# Patient Record
Sex: Female | Born: 1950 | Race: White | Hispanic: No | Marital: Single | Smoking: Never smoker
Health system: Southern US, Community
[De-identification: ages and names within clinical notes are randomized; demographics above are authoritative.]

## PROBLEM LIST (undated history)

## (undated) DIAGNOSIS — C801 Malignant (primary) neoplasm, unspecified: Secondary | ICD-10-CM

## (undated) DIAGNOSIS — I219 Acute myocardial infarction, unspecified: Secondary | ICD-10-CM

## (undated) DIAGNOSIS — E559 Vitamin D deficiency, unspecified: Secondary | ICD-10-CM

## (undated) DIAGNOSIS — D649 Anemia, unspecified: Secondary | ICD-10-CM

## (undated) DIAGNOSIS — I509 Heart failure, unspecified: Secondary | ICD-10-CM

## (undated) DIAGNOSIS — E669 Obesity, unspecified: Secondary | ICD-10-CM

## (undated) DIAGNOSIS — E119 Type 2 diabetes mellitus without complications: Secondary | ICD-10-CM

## (undated) DIAGNOSIS — I2510 Atherosclerotic heart disease of native coronary artery without angina pectoris: Secondary | ICD-10-CM

## (undated) DIAGNOSIS — Z973 Presence of spectacles and contact lenses: Secondary | ICD-10-CM

## (undated) DIAGNOSIS — S42402A Unspecified fracture of lower end of left humerus, initial encounter for closed fracture: Secondary | ICD-10-CM

## (undated) HISTORY — DX: Acute myocardial infarction, unspecified: I21.9

## (undated) HISTORY — DX: Atherosclerotic heart disease of native coronary artery without angina pectoris: I25.10

## (undated) HISTORY — PX: SACRAL NERVE STIMULATOR PLACEMENT: SHX2367

## (undated) HISTORY — PX: CARDIAC CATHETERIZATION: SHX172

## (undated) HISTORY — DX: Obesity, unspecified: E66.9

---

## 1997-01-04 HISTORY — PX: CHOLECYSTECTOMY: SHX55

## 1997-05-09 ENCOUNTER — Ambulatory Visit (HOSPITAL_COMMUNITY): Admission: RE | Admit: 1997-05-09 | Discharge: 1997-05-09 | Payer: Self-pay | Admitting: Cardiovascular Disease

## 1998-03-10 ENCOUNTER — Ambulatory Visit (HOSPITAL_COMMUNITY): Admission: RE | Admit: 1998-03-10 | Discharge: 1998-03-10 | Payer: Self-pay | Admitting: *Deleted

## 1998-03-10 ENCOUNTER — Encounter: Payer: Self-pay | Admitting: *Deleted

## 1998-12-12 ENCOUNTER — Encounter: Admission: RE | Admit: 1998-12-12 | Discharge: 1999-03-12 | Payer: Self-pay | Admitting: Anesthesiology

## 1999-03-13 ENCOUNTER — Encounter: Admission: RE | Admit: 1999-03-13 | Discharge: 1999-06-11 | Payer: Self-pay | Admitting: Anesthesiology

## 1999-04-03 ENCOUNTER — Other Ambulatory Visit: Admission: RE | Admit: 1999-04-03 | Discharge: 1999-04-03 | Payer: Self-pay | Admitting: Family Medicine

## 2000-05-18 ENCOUNTER — Encounter: Admission: RE | Admit: 2000-05-18 | Discharge: 2000-08-03 | Payer: Self-pay | Admitting: Anesthesiology

## 2000-06-03 ENCOUNTER — Encounter: Admission: RE | Admit: 2000-06-03 | Discharge: 2000-08-03 | Payer: Self-pay | Admitting: Surgical Oncology

## 2001-01-04 DIAGNOSIS — E669 Obesity, unspecified: Secondary | ICD-10-CM

## 2001-01-04 HISTORY — DX: Obesity, unspecified: E66.9

## 2001-01-04 HISTORY — PX: GASTRIC BYPASS: SHX52

## 2001-04-24 ENCOUNTER — Other Ambulatory Visit: Admission: RE | Admit: 2001-04-24 | Discharge: 2001-04-24 | Payer: Self-pay | Admitting: *Deleted

## 2001-05-01 ENCOUNTER — Encounter: Admission: RE | Admit: 2001-05-01 | Discharge: 2001-05-01 | Payer: Self-pay | Admitting: Family Medicine

## 2001-05-01 ENCOUNTER — Encounter: Payer: Self-pay | Admitting: Family Medicine

## 2001-05-04 ENCOUNTER — Encounter: Admission: RE | Admit: 2001-05-04 | Discharge: 2001-05-04 | Payer: Self-pay | Admitting: Family Medicine

## 2001-05-04 ENCOUNTER — Encounter: Payer: Self-pay | Admitting: Family Medicine

## 2002-08-13 ENCOUNTER — Encounter: Payer: Self-pay | Admitting: Family Medicine

## 2002-08-13 ENCOUNTER — Encounter: Admission: RE | Admit: 2002-08-13 | Discharge: 2002-08-13 | Payer: Self-pay | Admitting: Family Medicine

## 2002-11-05 ENCOUNTER — Emergency Department (HOSPITAL_COMMUNITY): Admission: EM | Admit: 2002-11-05 | Discharge: 2002-11-05 | Payer: Self-pay | Admitting: Emergency Medicine

## 2002-11-19 ENCOUNTER — Ambulatory Visit (HOSPITAL_COMMUNITY): Admission: RE | Admit: 2002-11-19 | Discharge: 2002-11-19 | Payer: Self-pay | Admitting: Family Medicine

## 2003-06-13 ENCOUNTER — Other Ambulatory Visit: Admission: RE | Admit: 2003-06-13 | Discharge: 2003-06-13 | Payer: Self-pay | Admitting: Family Medicine

## 2003-07-02 ENCOUNTER — Ambulatory Visit (HOSPITAL_COMMUNITY): Admission: RE | Admit: 2003-07-02 | Discharge: 2003-07-02 | Payer: Self-pay | Admitting: Family Medicine

## 2003-07-05 DIAGNOSIS — I2510 Atherosclerotic heart disease of native coronary artery without angina pectoris: Secondary | ICD-10-CM

## 2003-07-05 HISTORY — DX: Atherosclerotic heart disease of native coronary artery without angina pectoris: I25.10

## 2003-07-24 ENCOUNTER — Inpatient Hospital Stay (HOSPITAL_BASED_OUTPATIENT_CLINIC_OR_DEPARTMENT_OTHER): Admission: RE | Admit: 2003-07-24 | Discharge: 2003-07-24 | Payer: Self-pay | Admitting: *Deleted

## 2003-08-08 ENCOUNTER — Ambulatory Visit (HOSPITAL_COMMUNITY): Admission: RE | Admit: 2003-08-08 | Discharge: 2003-08-08 | Payer: Self-pay | Admitting: Cardiovascular Disease

## 2005-06-23 ENCOUNTER — Encounter: Admission: RE | Admit: 2005-06-23 | Discharge: 2005-06-23 | Payer: Self-pay | Admitting: Family Medicine

## 2006-09-21 ENCOUNTER — Encounter: Admission: RE | Admit: 2006-09-21 | Discharge: 2006-09-21 | Payer: Self-pay | Admitting: Family Medicine

## 2006-10-05 ENCOUNTER — Encounter: Admission: RE | Admit: 2006-10-05 | Discharge: 2006-10-05 | Payer: Self-pay | Admitting: Family Medicine

## 2008-06-04 ENCOUNTER — Encounter (INDEPENDENT_AMBULATORY_CARE_PROVIDER_SITE_OTHER): Payer: Self-pay | Admitting: *Deleted

## 2008-06-20 ENCOUNTER — Encounter: Admission: RE | Admit: 2008-06-20 | Discharge: 2008-06-20 | Payer: Self-pay | Admitting: Family Medicine

## 2008-09-26 ENCOUNTER — Emergency Department (HOSPITAL_COMMUNITY): Admission: EM | Admit: 2008-09-26 | Discharge: 2008-09-26 | Payer: Self-pay | Admitting: Emergency Medicine

## 2008-11-15 ENCOUNTER — Ambulatory Visit: Payer: Self-pay | Admitting: Family Medicine

## 2008-11-15 DIAGNOSIS — M2140 Flat foot [pes planus] (acquired), unspecified foot: Secondary | ICD-10-CM | POA: Insufficient documentation

## 2008-11-15 DIAGNOSIS — E663 Overweight: Secondary | ICD-10-CM | POA: Insufficient documentation

## 2008-11-15 DIAGNOSIS — Z8679 Personal history of other diseases of the circulatory system: Secondary | ICD-10-CM | POA: Insufficient documentation

## 2008-11-15 DIAGNOSIS — I2510 Atherosclerotic heart disease of native coronary artery without angina pectoris: Secondary | ICD-10-CM | POA: Insufficient documentation

## 2008-11-15 DIAGNOSIS — Z862 Personal history of diseases of the blood and blood-forming organs and certain disorders involving the immune mechanism: Secondary | ICD-10-CM | POA: Insufficient documentation

## 2008-11-15 DIAGNOSIS — M79609 Pain in unspecified limb: Secondary | ICD-10-CM | POA: Insufficient documentation

## 2008-11-15 DIAGNOSIS — Z8639 Personal history of other endocrine, nutritional and metabolic disease: Secondary | ICD-10-CM | POA: Insufficient documentation

## 2008-11-15 DIAGNOSIS — E559 Vitamin D deficiency, unspecified: Secondary | ICD-10-CM | POA: Insufficient documentation

## 2008-11-19 ENCOUNTER — Encounter: Payer: Self-pay | Admitting: Family Medicine

## 2008-11-19 ENCOUNTER — Ambulatory Visit: Payer: Self-pay | Admitting: Family Medicine

## 2008-11-19 ENCOUNTER — Other Ambulatory Visit: Admission: RE | Admit: 2008-11-19 | Discharge: 2008-11-19 | Payer: Self-pay | Admitting: Family Medicine

## 2008-11-19 LAB — HM PAP SMEAR

## 2008-11-20 ENCOUNTER — Encounter (INDEPENDENT_AMBULATORY_CARE_PROVIDER_SITE_OTHER): Payer: Self-pay | Admitting: *Deleted

## 2008-11-20 LAB — CONVERTED CEMR LAB
Albumin: 3.6 g/dL (ref 3.5–5.2)
Bilirubin, Direct: 0 mg/dL (ref 0.0–0.3)
Cholesterol: 131 mg/dL (ref 0–200)
Creatinine, Ser: 0.5 mg/dL (ref 0.4–1.2)
GFR calc non Af Amer: 135.39 mL/min (ref >60)
HDL: 43 mg/dL (ref >39.00)
LDL Cholesterol: 69 mg/dL (ref 0–99)
Potassium: 3.7 meq/L (ref 3.5–5.1)
Sodium: 143 meq/L (ref 135–145)
Total Protein: 6.6 g/dL (ref 6.0–8.3)
Triglycerides: 97 mg/dL (ref 0.0–149.0)
VLDL: 19.4 mg/dL (ref 0.0–40.0)

## 2008-12-11 ENCOUNTER — Telehealth: Payer: Self-pay | Admitting: Family Medicine

## 2009-01-21 ENCOUNTER — Ambulatory Visit: Payer: Self-pay | Admitting: Family Medicine

## 2009-01-22 ENCOUNTER — Encounter: Payer: Self-pay | Admitting: Family Medicine

## 2009-01-23 ENCOUNTER — Telehealth: Payer: Self-pay | Admitting: Family Medicine

## 2009-02-21 ENCOUNTER — Telehealth: Payer: Self-pay | Admitting: Internal Medicine

## 2009-03-04 ENCOUNTER — Ambulatory Visit: Payer: Self-pay | Admitting: Family Medicine

## 2009-03-18 ENCOUNTER — Ambulatory Visit: Payer: Self-pay | Admitting: Family Medicine

## 2009-03-20 ENCOUNTER — Encounter: Payer: Self-pay | Admitting: Family Medicine

## 2009-05-06 ENCOUNTER — Ambulatory Visit: Payer: Self-pay | Admitting: Family Medicine

## 2009-05-06 DIAGNOSIS — R5383 Other fatigue: Secondary | ICD-10-CM

## 2009-05-06 DIAGNOSIS — R5381 Other malaise: Secondary | ICD-10-CM | POA: Insufficient documentation

## 2009-05-06 DIAGNOSIS — M5412 Radiculopathy, cervical region: Secondary | ICD-10-CM | POA: Insufficient documentation

## 2009-05-06 LAB — CONVERTED CEMR LAB
Basophils Absolute: 0 10*3/uL (ref 0.0–0.1)
Basophils Relative: 0.2 % (ref 0.0–3.0)
Folate: 13.5 ng/mL
HCT: 35.4 % — ABNORMAL LOW (ref 36.0–46.0)
Lymphs Abs: 1.5 10*3/uL (ref 0.7–4.0)
MCHC: 34 g/dL (ref 30.0–36.0)
MCV: 91.6 fL (ref 78.0–100.0)
Monocytes Absolute: 0.5 10*3/uL (ref 0.1–1.0)
Monocytes Relative: 7 % (ref 3.0–12.0)
Neutrophils Relative %: 70.1 % (ref 43.0–77.0)
Platelets: 221 10*3/uL (ref 150.0–400.0)
Saturation Ratios: 14 % — ABNORMAL LOW (ref 20.0–50.0)

## 2009-05-07 DIAGNOSIS — M949 Disorder of cartilage, unspecified: Secondary | ICD-10-CM

## 2009-05-07 DIAGNOSIS — M899 Disorder of bone, unspecified: Secondary | ICD-10-CM | POA: Insufficient documentation

## 2009-05-14 ENCOUNTER — Encounter: Admission: RE | Admit: 2009-05-14 | Discharge: 2009-05-14 | Payer: Self-pay | Admitting: Family Medicine

## 2009-06-24 ENCOUNTER — Encounter: Admission: RE | Admit: 2009-06-24 | Discharge: 2009-06-24 | Payer: Self-pay | Admitting: Family Medicine

## 2009-06-24 LAB — HM MAMMOGRAPHY: HM Mammogram: NORMAL

## 2009-08-27 ENCOUNTER — Ambulatory Visit: Payer: Self-pay | Admitting: Family Medicine

## 2010-01-21 ENCOUNTER — Ambulatory Visit
Admission: RE | Admit: 2010-01-21 | Discharge: 2010-01-21 | Payer: Self-pay | Source: Home / Self Care | Attending: Family Medicine | Admitting: Family Medicine

## 2010-01-21 DIAGNOSIS — F4323 Adjustment disorder with mixed anxiety and depressed mood: Secondary | ICD-10-CM | POA: Insufficient documentation

## 2010-02-05 NOTE — Assessment & Plan Note (Signed)
Summary: IRON LOW/DLO   Vital Signs:  Patient profile:   60 year old female Height:      60 inches Weight:      177.50 pounds BMI:     34.79 Temp:     98.7 degrees F oral Pulse rate:   64 / minute Pulse rhythm:   regular BP sitting:   142 / 72  (left arm) Cuff size:   large  Vitals Entered By: Delilah Shan CMA [facility]) (May 06, 2009 10:23 AM) CC: fatigue   History of Present Illness: 60 yo with 2-3 weeks of fatigue.  Not sleeping more or less. No DOE or any SOB. No CP, no LE edema. Has had iron deficiecny is past so she was wondering if that is what is causing this.  She does have h/o VIt D deficiency, last Vit D was 30 on March 18, 2009.  Still taking 2000 units daily.    H/o gastric bypass, so absorption of Vit D and other compounds is an issue.  Also noticed some tingling in her right hand a few days ago. No decreased strength or range of motion.  She can always "shake it out."  noticed in her forearm as well several weeks ago.  Current Medications (verified): 1)  Alendronate Sodium 70 Mg Tabs (Alendronate Sodium) .... Take 1 Tablet By Mouth Once A Week 2)  Vitamin D3 10000 Unit Caps (Cholecalciferol) .... 2 Tabs By Mouth Daily. 3)  Multivitamins   Tabs (Multiple Vitamin) .... Take 1 Tablet By Mouth Once A Day 4)  Vitamin D 1000 Unit  Tabs (Cholecalciferol) .... Take One Tablet By Mouth Once Daily  Allergies: 1)  ! Penicillin  Review of Systems      See HPI General:  Complains of fatigue and malaise; denies sleep disorder. Eyes:  Denies blurring. ENT:  Denies difficulty swallowing. CV:  Denies chest pain or discomfort. Resp:  Denies shortness of breath. GI:  Denies abdominal pain, bloody stools, and change in bowel habits. GU:  Denies abnormal vaginal bleeding. MS:  Denies joint pain, joint redness, and joint swelling. Derm:  Denies rash. Neuro:  Denies headaches. Psych:  Denies anxiety and depression. Endo:  Denies cold intolerance and heat  intolerance.  Physical Exam  General:  Well-developed,well-nourished,in no acute distress; alert,appropriate and cooperative throughout examination, very pleasant. Eyes:  No corneal or conjunctival inflammation noted. EOMI. Perrla. Funduscopic exam benign, without hemorrhages, exudates or papilledema. Vision grossly normal. Mouth:  Oral mucosa and oropharynx without lesions or exudates.  Teeth in good repair. Neck:  No deformities, masses, or tenderness noted. Lungs:  Normal respiratory effort, chest expands symmetrically. Lungs are clear to auscultation, no crackles or wheezes. Heart:  Normal rate and regular rhythm. S1 and S2 normal without gallop, murmur, click, rub or other extra sounds. Abdomen:  Bowel sounds positive,abdomen soft and non-tender without masses, organomegaly or hernias noted. Extremities:  No clubbing, cyanosis, edema, or deformity noted with normal full range of motion of all joints.   Neurologic:  Normal strength in all extremities, normal sensation.   Shoulder/Elbow Exam  Shoulder Exam:    Right:    Inspection:  Normal    Palpation:  Normal    Stability:  stable    Tenderness:  no    Swelling:  no    Erythema:  no    Normal range of motion.    Left:    Inspection:  Normal    Palpation:  Normal    Stability:  stable  Tenderness:  no    Swelling:  no    Erythema:  no   Impression & Recommendations:  Problem # 1:  FATIGUE (ICD-780.79) Assessment New Differential is wide.  Given her h/o decreased absorption s/p gastric bypass, B12/folate deficiency is at the top of my differential.  May also explain some of her radiculopathy. Will check B12, folate, CBC, TSH. Recheck VIt D as well as deficiency can exacerbate fatigue. 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  Most likely related to cervical disc disease.  If blood work normal and symptoms persist, will need imaging. No sign of carpal tunnel.  Complete Medication List: 1)  Alendronate Sodium 70 Mg Tabs (Alendronate sodium) .... Take 1 tablet by mouth once a week 2)  Vitamin D3 10000 Unit Caps (Cholecalciferol) .... 2 tabs by mouth daily. 3)  Multivitamins Tabs (Multiple vitamin) .... Take 1 tablet by mouth once a day 4)  Vitamin D 1000 Unit Tabs (Cholecalciferol) .... Take one tablet by mouth once daily  Other Orders: T-Vitamin D (25-Hydroxy) ([id number])  Current Allergies (reviewed today): ! PENICILLIN

## 2010-02-05 NOTE — Letter (Signed)
Summary: Certification of Disability for Property Tax Exclusion/State of   Certification of Disability for Property Tax Exclusion/State of [place]   Imported By: Lanelle Bal 01/28/2009 14:11:26  _____________________________________________________________________  External Attachment:    Type:   Image     Comment:   External Document

## 2010-02-05 NOTE — Assessment & Plan Note (Signed)
Summary: discuss meds/alc   Vital Signs:  Patient profile:   60 year old female Height:      60 inches Weight:      187 pounds BMI:     36.65 Temp:     98.6 degrees F oral Pulse rate:   76 / minute Pulse rhythm:   regular BP sitting:   130 / 80  (left arm) Cuff size:   large  Vitals Entered By: Linde Gillis CMA [facility]) (January 21, 2010 8:48 AM) CC: discuss medications   History of Present Illness: 60 yo here for anxiety/depression.  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  Past month, she feels like she is more impatient with people, "snappy." Feels on edge, like something bad is going to happen. Denies any tearfulness, anhedonia, changes in appetite or sleep.  Lost her father a few months ago, a few friends have cancer. she just feels on edge like one of the them might die soon.  No SI or HI.    Allergies: 1)  ! Penicillin  Review of Systems      See HPI General:  Denies malaise. Psych:  Complains of anxiety, depression, and irritability; denies sense of great danger, suicidal thoughts/plans, thoughts of violence, unusual visions or sounds, and thoughts /plans of harming others.  Physical Exam  General:  Well-developed,well-nourished,in no acute distress; alert,appropriate and cooperative throughout examination, very pleasant. Psych:  Cognition and judgment appear intact. Alert and cooperative with normal attention span and concentration. 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 Pt has a therapist but feels that overall she is doing fine and does not need psychotherapy.  She just feels "on edge." Will try Celexa 20 mg daily.  Pt to follow up in  one month. Pt information handout given about Celexa.  Complete Medication List: 1)  Alendronate Sodium 70 Mg Tabs (Alendronate sodium) .... Take 1 tablet by mouth once a week 2)  Vitamin D3 10000 Unit Caps (Cholecalciferol) .... 2 tabs by mouth daily. 3)  Multivitamins Tabs (Multiple vitamin) .... Take 1 tablet by mouth once a day 4)  Vitamin D 1000 Unit Tabs (Cholecalciferol) .... Take one tablet by mouth once daily 5)  Citalopram Hydrobromide 20 Mg Tabs (Citalopram hydrobromide) .... Take one tablet by mouth daily  Patient Instructions: 1)  Please call me in one month with an update. 2)  It was so nice to see you. Prescriptions: CITALOPRAM HYDROBROMIDE 20 MG TABS (CITALOPRAM HYDROBROMIDE) take one tablet by mouth daily  #90 x 3   Entered and Authorized by:   Ruthe Mannan MD   Signed by:   Ruthe Mannan MD on 01/21/2010   Method used:   Electronically to        [facility]. [address] [phone]* (retail)       [address]       [place], [place]  [address]       Ph: [phone] or [phone]       Fax: [phone]   RxID:   [id number]    Orders Added: 1)  Est. Patient Level IV [66440]    Current Allergies (reviewed today): ! PENICILLIN

## 2010-02-05 NOTE — Progress Notes (Signed)
Summary: Verification of Vit D3 10000 units  Phone Note From Pharmacy Call back at [phone]   Caller: Leonie Man with [facility] Call For: Dr Dayton Martes  Summary of Call: Leonie Man pharmacist at [facility] called and said vit D3 10000 unit caps taking two daily was a high dose and wanted to verify that was what Dr. Dayton Martes wanted. Please advise.  Initial call taken by: Lewanda Rife LPN,  January 23, 2009 3:04 PM  Follow-up for Phone Call        Yes, she has been taking 50,000 units weekly and Vit D is still extremely low.  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  Would like to try 20,000 units daily for six weeks and recheck. Follow-up by: Ruthe Mannan MD,  January 23, 2009 3:08 PM  Additional Follow-up for Phone Call Additional follow up Details #1::        Pharmacy called. Additional Follow-up by: Delilah Shan CMA (AAMA),  January 23, 2009 3:11 PM

## 2010-02-05 NOTE — Progress Notes (Signed)
Summary: Schedule Colonoscopy  Phone Note Outgoing Call Call back at Home Phone [phone]   Call placed by: Harlow Mares CMA [facility]),  February 21, 2009 11:56 AM Call placed to: Patient Summary of Call: Left message on patients machine to call back. patient needs to schedule colonscopy Initial call taken by: Harlow Mares CMA [facility] Dull),  February 21, 2009 11:56 AM  Follow-up for Phone Call        Left message on patients machine to call back.  Follow-up by: Harlow Mares CMA [facility] Dull),  February 26, 2009 2:15 PM

## 2010-02-05 NOTE — Miscellaneous (Signed)
Summary: Vitamin D 1000iu  Clinical Lists Changes  Medications: Added new medication of VITAMIN D 1000 UNIT  TABS (CHOLECALCIFEROL) take one tablet by mouth once daily Changed medication from VITAMIN D3 10000 UNIT CAPS (CHOLECALCIFEROL) 2 tabs by mouth daily. to VITAMIN D3 10000 UNIT CAPS (CHOLECALCIFEROL) 2 tabs by mouth daily.     Current Allergies: ! PENICILLIN

## 2010-03-18 ENCOUNTER — Telehealth: Payer: Self-pay | Admitting: Family Medicine

## 2010-03-24 NOTE — Progress Notes (Signed)
Summary: wants to stop citalopram  Phone Note Call from Patient Call back at Home Phone [phone]   Caller: Patient Call For: Ruthe Mannan MD Summary of Call: Pt wants to stop citalopram and she is asking how to taper down off of this.  Please advise.               Lowella Petties CMA, AAMA  March 18, 2010 4:54 PM   Follow-up for Phone Call        Take one tablet every other day for a week and then stop.  Is she feeling better? Ruthe Mannan MD  March 19, 2010 7:28 AM   Additional Follow-up for Phone Call Additional follow up Details #1::        Advised pt, she says that yes, she is feeling better, but she doesnt think the medicine is helping her- cant tell any difference with it.  She will call next week to schedule appt for follow up. Additional Follow-up by: Lowella Petties CMA, AAMA,  March 19, 2010 9:15 AM    Additional Follow-up for Phone Call Additional follow up Details #2::    ok thank you. Ruthe Mannan MD  March 19, 2010 9:50 AM

## 2010-05-22 NOTE — Consult Note (Signed)
[facility]  Patient:    Tonya [name], NULL                     MRN: [id number] Proc. Date: 05/08/99 Attending:  Thyra Breed, M.D. CC:         Talmadge Coventry, M.D.             Dr. Jeffie Pollock, M.D.                          Consultation Report  FOLLOW-UP EVALUATION  HISTORY OF PRESENT ILLNESS:  Haillee actually seems to be doing better today. She does not state that she is emphatically improved, but she states that she has improved to the point where she has less complaints. She has been seeing Dr. Madaline Guthrie as well as Dr. Sherrine Maples. She is now on Seroquel 600 mg per day in addition to the atenolol, diclofenac, Neurontin 400 mg t.i.d. and Catapres which she is taking 0.1 1/2 tablet q. day.  PHYSICAL EXAMINATION:  VITAL SIGNS:  Blood pressure 149/59, heart rate 74, respiratory rate 18, O2 sats 98%. Pain level was stated to be 10/10 but she is obviously not quite as uncomfortable as that. Her temperature is 98 degrees.  NEUROLOGIC:  Her neuro exam is unchanged.  IMPRESSION: 1. 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 2. Other medical problems per Dr. Smith Mince.  DISPOSITION: 1. Increase Neurontin to 600 mg t.i.d. 2. Continue on Neurontin and OxyContin 10 mg 1 p.o. b.i.d. 3. Continue on her current Catapres. 4. Followup with me in 4 weeks. DD:  05/08/99 TD:  05/12/99 Job: [id number]

## 2010-05-22 NOTE — Cardiovascular Report (Signed)
NAME:  Tonya [name], REMILLARD [facility]                    ACCOUNT NO.:  [id number]   MEDICAL RECORD NO.:  [id number]                   PATIENT TYPE:  OIB   LOCATION:  6501                                 FACILITY:  [facility]   PHYSICIAN:  Carole Binning, M.D. [facility]         DATE OF BIRTH:  1950-12-06   DATE OF PROCEDURE:  07/24/2003  DATE OF DISCHARGE:                              CARDIAC CATHETERIZATION   PROCEDURE PERFORMED:  Right and left heart catheterization with coronary  angiography and left ventriculography.   INDICATION:  Ms. 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  She  recently underwent a stress Cardiolite in the office which showed  significant anterior ischemia with ejection fraction of 22%.  This  represented significant change from previous ejection fraction.  She was  therefore referred for cardiac catheterization.   CATHETERIZATION PROCEDURAL NOTE:  An 8 French sheath was placed in the right  femoral vein, 5 French sheath in the right femoral artery.  Right heart  catheterization was performed with Swan-Ganz catheter.  Coronary angiography  was performed with standard Judkins 5 French catheters.  Left  ventriculography was performed with an angled pigtail catheter.  Contrast  was Omnipaque.  There were no complications.   CATHETERIZATION RESULTS:   HEMODYNAMICS:  1. Right atrial mean pressure 12.  2. Right ventricular pressure 37/13.  3. Pulmonary artery pressure 37/17.  4. Pulmonary capillary wedge mean pressure 14.  5. Left ventricular pressure 146/16.  6. Aortic pressure 147/76.  7. There is no aortic valve gradient.   Cardiac output by the thermodilution method is 5.5.  Cardiac index 3.1.  Cardiac output by the Fick method is 3.7, cardiac index 2.1.   LEFT VENTRICULOGRAM:  There is mild global hypokinesis left ventricle.  However, overall the ejection fraction appears to be in the low  range of  normal being estimated at 50-60%.  There is no mitral regurgitation.   CORONARY ARTERIOGRAPHY (RIGHT DOMINANT):  Left main is normal.   Left anterior descending artery is 100% occluded in the mid vessel just  after a large first diagonal and a first septal perforator.  The distal LAD  filled via grade 3 right-to-left collaterals.  There is a smaller diagonal  arising from the mid LAD.   Left circumflex gives rise to a large branching obtuse marginal and a small  posterior lateral branch.  The left circumflex is normal.   The right coronary artery is dominant vessel.  There is a 20% stenosis in  the proximal vessel.  The right coronary artery is otherwise normal giving  rise to a large posterior descending artery and a small posterior lateral  branch.  The distal right coronary does supply collaterals to the distal  left anterior descending.   IMPRESSION:  1. Normal right and left heart filling pressures with mild pulmonary     hypertension.  2. Low normal to mildly  decreased left ventricular systolic function.  3. One-vessel coronary artery disease characterized by 100% occlusion of the     left anterior descending artery with right-to-left collaterals.   PLAN:  In summary, this has not appeared to have changed significantly from  previous catheterization.  The question is whether the patient would benefit  from vascularization with left internal mammary artery to the LAD.  At this  point, we will schedule a cardiac MRI to assess for both ischemia and  viability in the anterior wall to determine whether revascularization would  be beneficial.                                               Carole Binning, M.D. [facility]    MWP/MEDQ  D:  07/24/2003  T:  07/24/2003  Job:  [id number]   cc:   Talmadge Coventry, M.D.  [address]  [place]  [place] [address]  Fax: [phone]   Jesse Sans. Wall, M.D.
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
## 2010-06-24 ENCOUNTER — Telehealth: Payer: Self-pay | Admitting: *Deleted

## 2010-06-24 NOTE — Telephone Encounter (Signed)
Form signed.

## 2010-06-24 NOTE — Telephone Encounter (Signed)
Pt has brought in form for handicapped placard, form is on your desk.

## 2010-11-18 ENCOUNTER — Ambulatory Visit (INDEPENDENT_AMBULATORY_CARE_PROVIDER_SITE_OTHER): Payer: Medicare Other | Admitting: Family Medicine

## 2010-11-18 ENCOUNTER — Encounter: Payer: Self-pay | Admitting: Family Medicine

## 2010-11-18 VITALS — BP 122/70 | HR 60 | Temp 98.7°F | Ht 61.0 in | Wt 193.5 lb

## 2010-11-18 DIAGNOSIS — E663 Overweight: Secondary | ICD-10-CM

## 2010-11-18 MED ORDER — PHENTERMINE HCL 30 MG PO CAPS: 30.0000 mg | ORAL_CAPSULE | ORAL | Status: DC

## 2010-11-18 MED ORDER — PHENTERMINE HCL 15 MG PO CAPS: ORAL_CAPSULE | ORAL | Status: DC

## 2010-11-18 NOTE — Patient Instructions (Signed)
Good to see you. Please come back to see me in one month. Have a wonderful Thanksgiving.

## 2010-11-18 NOTE — Progress Notes (Signed)
  Subjective:    Patient ID: Tonya [name], female    DOB: Mar 30, 1950, 60 y.o.   MRN: [id number]  HPI  60 yo here to discuss weight loss.  Continues to gain weight, knows it is from afternoon snacking. Worst time of day is around 3 and 4 pm for her. Also loves to bake for other people. Tries to walk.  Wt Readings from Last 3 Encounters:  11/18/10 193 lb 8 oz (87.771 kg)  01/21/10 187 lb (84.823 kg)  05/06/09 177 lb 8 oz (80.513 kg)   Patient Active Problem List  Diagnoses  . VITAMIN D DEFICIENCY  . OVERWEIGHT  . CORONARY ARTERY DISEASE  . Brachial neuritis or radiculitis NOS  . FOOT PAIN, CHRONIC  . OSTEOPENIA  . PES PLANUS  . FATIGUE  . DIABETES MELLITUS, HX OF  . HYPERTENSION, HX OF  . ADJUSTMENT DISORDER WITH MIXED FEATURES   Past Medical History  Diagnosis Date  . Coronary artery disease 07/2003  . Obesity 2003    s/p gastric bypass   . Hypertension   . Diabetes mellitus    Past Surgical History  Procedure Date  . Gastric bypass 2003  . Cholecystectomy 1999   History  Substance Use Topics  . Smoking status: Never Smoker   . Smokeless tobacco: Not on file  . Alcohol Use: No   Family History  Problem Relation Age of Onset  . Heart attack Mother   . Heart attack Father    Allergies  Allergen Reactions  . Penicillins     REACTION: As a child.   No current outpatient prescriptions on file prior to visit.   The PMH, PSH, Social History, Family History, Medications, and allergies have been reviewed in [facility], and have been updated if relevant.   Review of Systems See HPI    Objective:   Physical Exam BP 122/70  Pulse 60  Temp(Src) 98.7 F (37.1 C) (Oral)  Ht 5\' 1"  (1.549 m)  Wt 193 lb 8 oz (87.771 kg)  BMI 36.56 kg/m2  General:  Well-developed,well-nourished,in no acute distress; alert,appropriate and cooperative throughout examination Head:  normocephalic and atraumatic.   Eyes:  vision grossly intact   Ears:  R ear normal and L ear  normal.   Nose:  no external deformity.   Mouth:  good dentition.   Psych:  Cognition and judgment appear intact. Alert and cooperative with normal attention span and concentration. No apparent delusions, illusions, hallucinations     Assessment & Plan:   1. Overweight   Deteriorated. >15 min spent with face to face with patient, >50% counseling and/or coordinating care. She would like to try Phentermine. Discussed risks- including HTN, pulm HTN, palpitations. Pt accepts risks and would like to proceed with trying it. Given 15 mg daily capsules to try for two weeks and increase to 30 mg daily thereafter. Follow up in 1 month.

## 2010-12-24 ENCOUNTER — Encounter: Payer: Self-pay | Admitting: Family Medicine

## 2010-12-24 ENCOUNTER — Ambulatory Visit (INDEPENDENT_AMBULATORY_CARE_PROVIDER_SITE_OTHER): Payer: Medicare Other | Admitting: Family Medicine

## 2010-12-24 VITALS — BP 118/70 | HR 64 | Temp 98.3°F | Ht 61.0 in | Wt 185.5 lb

## 2010-12-24 DIAGNOSIS — E663 Overweight: Secondary | ICD-10-CM

## 2010-12-24 MED ORDER — PHENTERMINE HCL 37.5 MG PO CAPS: 37.5000 mg | ORAL_CAPSULE | ORAL | Status: DC

## 2010-12-24 NOTE — Progress Notes (Signed)
  Subjective:    Patient ID: Tonya [name], female    DOB: 16-Nov-1950, 60 y.o.   MRN: [id number]  HPI  60 yo here to follow up obesity.  Doing well with phentermine.  Taking 30 mg at 11 am everyday. No CP, palpitations, HA or SOB. No difficulty falling asleep. Has curbed her appetite. Has lost 8 pounds in one month!  Wt Readings from Last 3 Encounters:  12/24/10 185 lb 8 oz (84.142 kg)  11/18/10 193 lb 8 oz (87.771 kg)  01/21/10 187 lb (84.823 kg)   Patient Active Problem List  Diagnoses  . VITAMIN D DEFICIENCY  . OVERWEIGHT  . CORONARY ARTERY DISEASE  . Brachial neuritis or radiculitis NOS  . FOOT PAIN, CHRONIC  . OSTEOPENIA  . PES PLANUS  . FATIGUE  . DIABETES MELLITUS, HX OF  . HYPERTENSION, HX OF  . ADJUSTMENT DISORDER WITH MIXED FEATURES   Past Medical History  Diagnosis Date  . Coronary artery disease 07/2003  . Obesity 2003    s/p gastric bypass   . Hypertension   . Diabetes mellitus    Past Surgical History  Procedure Date  . Gastric bypass 2003  . Cholecystectomy 1999   History  Substance Use Topics  . Smoking status: Never Smoker   . Smokeless tobacco: Not on file  . Alcohol Use: No   Family History  Problem Relation Age of Onset  . Heart attack Mother   . Heart attack Father    Allergies  Allergen Reactions  . Penicillins     REACTION: As a child.   Current Outpatient Prescriptions on File Prior to Visit  Medication Sig Dispense Refill  . alendronate (FOSAMAX) 70 MG tablet Take 70 mg by mouth every 7 (seven) days. Take with a full glass of water on an empty stomach.       . cholecalciferol (VITAMIN D) 1000 UNITS tablet Take 1,000 Units by mouth daily.        . phentermine 15 MG capsule 1 tab po daily x 2 weeks, then increase to 30 mg daily dose.  14 capsule  0   The PMH, PSH, Social History, Family History, Medications, and allergies have been reviewed in [facility], and have been updated if relevant.   Review of Systems See HPI      Objective:   Physical Exam BP 118/70  Pulse 64  Temp(Src) 98.3 F (36.8 C) (Oral)  Ht 5\' 1"  (1.549 m)  Wt 185 lb 8 oz (84.142 kg)  BMI 35.05 kg/m2  General:  Well-developed,well-nourished,in no acute distress; alert,appropriate and cooperative throughout examination Head:  normocephalic and atraumatic.   Eyes:  vision grossly intact   Ears:  R ear normal and L ear normal.   Nose:  no external deformity.   Mouth:  good dentition.   Psych:  Cognition and judgment appear intact. Alert and cooperative with normal attention span and concentration. No apparent delusions, illusions, hallucinations     Assessment & Plan:   1. Overweight    Improved with normal BP and pulse today. Will increase phentermine dose to 37.5 mg daily. Follow up in 1 month.

## 2010-12-24 NOTE — Patient Instructions (Signed)
Have a wonderful holiday. Please come back to see me in one month.

## 2011-01-10 ENCOUNTER — Other Ambulatory Visit: Payer: Self-pay | Admitting: Family Medicine

## 2011-01-28 ENCOUNTER — Ambulatory Visit (INDEPENDENT_AMBULATORY_CARE_PROVIDER_SITE_OTHER): Payer: Medicare Other | Admitting: Family Medicine

## 2011-01-28 ENCOUNTER — Encounter: Payer: Self-pay | Admitting: Family Medicine

## 2011-01-28 VITALS — BP 130/82 | HR 64 | Temp 97.9°F | Wt 179.2 lb

## 2011-01-28 DIAGNOSIS — E669 Obesity, unspecified: Secondary | ICD-10-CM | POA: Insufficient documentation

## 2011-01-28 MED ORDER — PHENTERMINE HCL 37.5 MG PO CAPS: 37.5000 mg | ORAL_CAPSULE | ORAL | Status: DC

## 2011-01-28 NOTE — Progress Notes (Signed)
  Subjective:    Patient ID: Tonya [name], female    DOB: Jun 22, 1950, 61 y.o.   MRN: [id number]  HPI  61 yo here to follow up obesity.  Doing well with phentermine.  Taking 37.5 mg at 11 am everyday. No CP, palpitations, HA or SOB. No difficulty falling asleep. Has curbed her appetite. Has lost 6 pounds in one month!  Wt Readings from Last 3 Encounters:  01/28/11 179 lb 4 oz (81.307 kg)  12/24/10 185 lb 8 oz (84.142 kg)  11/18/10 193 lb 8 oz (87.771 kg)   Patient Active Problem List  Diagnoses  . VITAMIN D DEFICIENCY  . OVERWEIGHT  . CORONARY ARTERY DISEASE  . Brachial neuritis or radiculitis NOS  . FOOT PAIN, CHRONIC  . OSTEOPENIA  . PES PLANUS  . FATIGUE  . DIABETES MELLITUS, HX OF  . HYPERTENSION, HX OF  . ADJUSTMENT DISORDER WITH MIXED FEATURES  . Obesity   Past Medical History  Diagnosis Date  . Coronary artery disease 07/2003  . Obesity 2003    s/p gastric bypass   . Hypertension   . Diabetes mellitus    Past Surgical History  Procedure Date  . Gastric bypass 2003  . Cholecystectomy 1999   History  Substance Use Topics  . Smoking status: Never Smoker   . Smokeless tobacco: Not on file  . Alcohol Use: No   Family History  Problem Relation Age of Onset  . Heart attack Mother   . Heart attack Father    Allergies  Allergen Reactions  . Penicillins     REACTION: As a child.   Current Outpatient Prescriptions on File Prior to Visit  Medication Sig Dispense Refill  . alendronate (FOSAMAX) 70 MG tablet TAKE 1 TABLET BY MOUTH ONCE WEEKLY  4 tablet  12  . cholecalciferol (VITAMIN D) 1000 UNITS tablet Take 1,000 Units by mouth daily.         The PMH, PSH, Social History, Family History, Medications, and allergies have been reviewed in [facility], and have been updated if relevant.   Review of Systems See HPI    Objective:   Physical Exam BP 130/82  Pulse 64  Temp(Src) 97.9 F (36.6 C) (Oral)  Wt 179 lb 4 oz (81.307 kg)  General:   Well-developed,well-nourished,in no acute distress; alert,appropriate and cooperative throughout examination Head:  normocephalic and atraumatic.   Eyes:  vision grossly intact   Ears:  R ear normal and L ear normal.   Nose:  no external deformity.   Mouth:  good dentition.   Psych:  Cognition and judgment appear intact. Alert and cooperative with normal attention span and concentration. No apparent delusions, illusions, hallucinations     Assessment & Plan:   1. Obesity    Improved with normal BP and pulse today. Refill  phentermine dose to 37.5 mg daily. Follow up in 1 month.

## 2011-01-28 NOTE — Patient Instructions (Signed)
Great to see you. Come see me in one month.

## 2011-03-04 ENCOUNTER — Encounter: Payer: Self-pay | Admitting: Family Medicine

## 2011-03-04 ENCOUNTER — Ambulatory Visit (INDEPENDENT_AMBULATORY_CARE_PROVIDER_SITE_OTHER): Payer: Medicare Other | Admitting: Family Medicine

## 2011-03-04 VITALS — BP 128/80 | HR 60 | Temp 98.1°F | Wt 172.0 lb

## 2011-03-04 DIAGNOSIS — E669 Obesity, unspecified: Secondary | ICD-10-CM

## 2011-03-04 MED ORDER — PHENTERMINE HCL 37.5 MG PO CAPS: 37.5000 mg | ORAL_CAPSULE | ORAL | Status: DC

## 2011-03-04 NOTE — Patient Instructions (Signed)
Great to see you. Continue current dose of phentermine. You do not follow up next month but want to restart in a few months, call me.

## 2011-03-04 NOTE — Progress Notes (Signed)
  Subjective:    Patient ID: Tonya [name], female    DOB: 10/27/1950, 61 y.o.   MRN: [id number]  HPI  61 yo here to follow up obesity.  Doing well with phentermine.  Taking 37.5 mg at 11 am everyday. No CP, palpitations, HA or SOB. No difficulty falling asleep. Has curbed her appetite. Has lost 6 pounds in one month (20 pounds total)!  Wt Readings from Last 3 Encounters:  03/04/11 172 lb (78.019 kg)  01/28/11 179 lb 4 oz (81.307 kg)  12/24/10 185 lb 8 oz (84.142 kg)   Patient Active Problem List  Diagnoses  . VITAMIN D DEFICIENCY  . OVERWEIGHT  . CORONARY ARTERY DISEASE  . Brachial neuritis or radiculitis NOS  . FOOT PAIN, CHRONIC  . OSTEOPENIA  . PES PLANUS  . FATIGUE  . DIABETES MELLITUS, HX OF  . HYPERTENSION, HX OF  . ADJUSTMENT DISORDER WITH MIXED FEATURES  . Obesity   Past Medical History  Diagnosis Date  . Coronary artery disease 07/2003  . Obesity 2003    s/p gastric bypass   . Hypertension   . Diabetes mellitus    Past Surgical History  Procedure Date  . Gastric bypass 2003  . Cholecystectomy 1999   History  Substance Use Topics  . Smoking status: Never Smoker   . Smokeless tobacco: Not on file  . Alcohol Use: No   Family History  Problem Relation Age of Onset  . Heart attack Mother   . Heart attack Father    Allergies  Allergen Reactions  . Penicillins     REACTION: As a child.   Current Outpatient Prescriptions on File Prior to Visit  Medication Sig Dispense Refill  . alendronate (FOSAMAX) 70 MG tablet TAKE 1 TABLET BY MOUTH ONCE WEEKLY  4 tablet  12  . cholecalciferol (VITAMIN D) 1000 UNITS tablet Take 1,000 Units by mouth daily.        . phentermine 37.5 MG capsule Take 1 capsule (37.5 mg total) by mouth every morning.  30 capsule  0   The PMH, PSH, Social History, Family History, Medications, and allergies have been reviewed in [facility], and have been updated if relevant.   Review of Systems See HPI    Objective:   Physical  Exam BP 128/80  Pulse 60  Temp(Src) 98.1 F (36.7 C) (Oral)  Wt 172 lb (78.019 kg)  General:  Well-developed,well-nourished,in no acute distress; alert,appropriate and cooperative throughout examination Head:  normocephalic and atraumatic.   Eyes:  vision grossly intact   Ears:  R ear normal and L ear normal.   Nose:  no external deformity.   Mouth:  good dentition.   Psych:  Cognition and judgment appear intact. Alert and cooperative with normal attention span and concentration. No apparent delusions, illusions, hallucinations     Assessment & Plan:   1. Obesity    Improved with normal BP and pulse today. Refill  phentermine 37.5 mg daily. No further refills. The patient indicates understanding of these issues and agrees with the plan.

## 2011-07-19 ENCOUNTER — Encounter: Payer: Self-pay | Admitting: Internal Medicine

## 2011-07-26 ENCOUNTER — Encounter: Payer: Self-pay | Admitting: Family Medicine

## 2011-07-26 ENCOUNTER — Ambulatory Visit (INDEPENDENT_AMBULATORY_CARE_PROVIDER_SITE_OTHER): Payer: Medicare Other | Admitting: Family Medicine

## 2011-07-26 VITALS — BP 110/72 | HR 59 | Temp 98.5°F | Ht 61.0 in | Wt 165.0 lb

## 2011-07-26 DIAGNOSIS — L02419 Cutaneous abscess of limb, unspecified: Secondary | ICD-10-CM | POA: Insufficient documentation

## 2011-07-26 DIAGNOSIS — Z862 Personal history of diseases of the blood and blood-forming organs and certain disorders involving the immune mechanism: Secondary | ICD-10-CM

## 2011-07-26 DIAGNOSIS — Z8639 Personal history of other endocrine, nutritional and metabolic disease: Secondary | ICD-10-CM

## 2011-07-26 DIAGNOSIS — L03119 Cellulitis of unspecified part of limb: Secondary | ICD-10-CM

## 2011-07-26 NOTE — Assessment & Plan Note (Signed)
Reviewed hospital notes. 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 Continue clindamycin as improving. Follow up if redness spreading further.

## 2011-07-26 NOTE — Progress Notes (Signed)
  Subjective:    Patient ID: Tonya [name], female    DOB: 11/03/1950, 61 y.o.   MRN: [id number]  HPI  61 year old female patient of Dr. Dayton Martes with history of DM  Presents new infection in left leg. Had bruise 1 week ago, over weekend noted redness spreading around site. Pain and swelling as well.  Seen on 7/20 at ER at [facility]... Dx with cellulitis. Given lortab, and clindamycin 300 mg QID x 7 days. No SE to this medication. Never filled pain med as area no painful.  Told to follow up with PCP.   She reports that since then rash has decreased in redness, decreased from border of marker, minimally painful. No fever, no CP, no SOB.     Review of Systems  Constitutional: Negative for fever and fatigue.  HENT: Negative for congestion.   Eyes: Negative for pain.  Respiratory: Negative for cough and shortness of breath.   Cardiovascular: Negative for chest pain and leg swelling.       Objective:   Physical Exam  Constitutional: She appears well-developed and well-nourished.  Neck: Normal range of motion. Neck supple.  Cardiovascular: Normal rate and regular rhythm.  Exam reveals no gallop and no friction rub.   No murmur heard. Pulmonary/Chest: Effort normal and breath sounds normal.  Skin: Skin is warm and dry. Rash noted. There is erythema.       Area of redness  On left lower leg, decrease 3 inches back from marker line, bruising on posterior calf healing.          Assessment & Plan:

## 2011-08-19 ENCOUNTER — Encounter: Payer: Self-pay | Admitting: Family Medicine

## 2011-08-19 ENCOUNTER — Ambulatory Visit (INDEPENDENT_AMBULATORY_CARE_PROVIDER_SITE_OTHER): Payer: Medicare Other | Admitting: Family Medicine

## 2011-08-19 VITALS — BP 120/70 | HR 60 | Temp 98.2°F | Wt 166.0 lb

## 2011-08-19 DIAGNOSIS — M79609 Pain in unspecified limb: Secondary | ICD-10-CM

## 2011-08-19 DIAGNOSIS — I2510 Atherosclerotic heart disease of native coronary artery without angina pectoris: Secondary | ICD-10-CM

## 2011-08-19 NOTE — Progress Notes (Signed)
  Subjective:    Patient ID: Tonya [name], female    DOB: 1950/09/14, 61 y.o.   MRN: [id number]  HPI  61 yo here to discuss her disability paperwork.  She has chronic foot pain with neuropathy since 2000. Not taking any medications for it at this point as none were successful.  She is prone to falls, especially walking down stairs. Cannot stand for long periods of time.  She is undergoing biofeedback and tries a more holistic approach.  Also swimming.  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 Has not followed up with cardiology since 2005.  Patient Active Problem List  Diagnosis  . VITAMIN D DEFICIENCY  . OVERWEIGHT  . CORONARY ARTERY DISEASE  . Brachial neuritis or radiculitis NOS  . FOOT PAIN, CHRONIC  . OSTEOPENIA  . PES PLANUS  . FATIGUE  . DIABETES MELLITUS, HX OF  . HYPERTENSION, HX OF  . ADJUSTMENT DISORDER WITH MIXED FEATURES  . Obesity  . Cellulitis and abscess of lower leg   Past Medical History  Diagnosis Date  . Coronary artery disease 07/2003  . Obesity 2003    s/p gastric bypass   . Hypertension   . Diabetes mellitus    Past Surgical History  Procedure Date  . Gastric bypass 2003  . Cholecystectomy 1999   History  Substance Use Topics  . Smoking status: Never Smoker   . Smokeless tobacco: Not on file  . Alcohol Use: No   Family History  Problem Relation Age of Onset  . Heart attack Mother   . Heart attack Father    Allergies  Allergen Reactions  . Penicillins     REACTION: As a child.   Current Outpatient Prescriptions on File Prior to Visit  Medication Sig Dispense Refill  . alendronate (FOSAMAX) 70 MG tablet TAKE 1 TABLET BY MOUTH ONCE WEEKLY  4 tablet  12  . cholecalciferol (VITAMIN D) 1000 UNITS tablet Take 1,000 Units by mouth daily.         The PMH, PSH, Social History, Family History, Medications, and allergies have been reviewed in [facility], and have been updated if relevant.   Review of Systems See HPI    Objective:   Physical  Exam BP 120/70  Pulse 60  Temp 98.2 F (36.8 C)  Wt 166 lb (75.297 kg) Gen: alert, pleasant, NAD Psych:  Good eye contact.  Not anxious or depressed appearing.     Assessment & Plan:   1. CORONARY ARTERY DISEASE  Ambulatory referral to Cardiology  2. FOOT PAIN, CHRONIC  >15 min spent with face to face with patient, >50% counseling and/or coordinating care. Forms filled out and faxed to disability insurance provider.

## 2011-09-15 ENCOUNTER — Ambulatory Visit: Payer: Medicare Other | Admitting: Cardiology

## 2012-01-10 ENCOUNTER — Other Ambulatory Visit: Payer: Self-pay | Admitting: Family Medicine

## 2012-02-10 ENCOUNTER — Encounter: Payer: Self-pay | Admitting: Family Medicine

## 2012-02-10 ENCOUNTER — Encounter: Payer: Self-pay | Admitting: *Deleted

## 2012-02-10 ENCOUNTER — Ambulatory Visit (INDEPENDENT_AMBULATORY_CARE_PROVIDER_SITE_OTHER): Payer: Medicare Other | Admitting: Family Medicine

## 2012-02-10 VITALS — BP 120/72 | HR 72 | Temp 98.0°F | Wt 172.0 lb

## 2012-02-10 DIAGNOSIS — F341 Dysthymic disorder: Secondary | ICD-10-CM

## 2012-02-10 DIAGNOSIS — F32A Depression, unspecified: Secondary | ICD-10-CM

## 2012-02-10 DIAGNOSIS — F419 Anxiety disorder, unspecified: Secondary | ICD-10-CM

## 2012-02-10 DIAGNOSIS — E669 Obesity, unspecified: Secondary | ICD-10-CM

## 2012-02-10 DIAGNOSIS — F329 Major depressive disorder, single episode, unspecified: Secondary | ICD-10-CM | POA: Insufficient documentation

## 2012-02-10 LAB — CBC WITH DIFFERENTIAL/PLATELET
Basophils Absolute: 0 10*3/uL (ref 0.0–0.1)
Basophils Relative: 0.3 % (ref 0.0–3.0)
Eosinophils Absolute: 0 10*3/uL (ref 0.0–0.7)
Eosinophils Relative: 0.4 % (ref 0.0–5.0)
HCT: 37.9 % (ref 36.0–46.0)
MCV: 90.2 fl (ref 78.0–100.0)
Monocytes Relative: 6.5 % (ref 3.0–12.0)
Neutrophils Relative %: 72.6 % (ref 43.0–77.0)
Platelets: 241 10*3/uL (ref 150.0–400.0)
RBC: 4.2 Mil/uL (ref 3.87–5.11)
RDW: 13.7 % (ref 11.5–14.6)
WBC: 7.4 10*3/uL (ref 4.5–10.5)

## 2012-02-10 LAB — COMPREHENSIVE METABOLIC PANEL
ALT: 41 U/L — ABNORMAL HIGH (ref 0–35)
Albumin: 3.9 g/dL (ref 3.5–5.2)
Creatinine, Ser: 0.5 mg/dL (ref 0.4–1.2)
Potassium: 4.2 mEq/L (ref 3.5–5.1)
Sodium: 139 mEq/L (ref 135–145)

## 2012-02-10 LAB — TSH: TSH: 1.48 u[IU]/mL (ref 0.35–5.50)

## 2012-02-10 MED ORDER — PHENTERMINE HCL 30 MG PO CAPS: 30.0000 mg | ORAL_CAPSULE | ORAL | Status: DC

## 2012-02-10 NOTE — Patient Instructions (Addendum)
Good to see you. Say hello to [facility] for me.  Please come see me in one month.

## 2012-02-10 NOTE — Progress Notes (Signed)
62 yo here for anxiety/depression and obesity.   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  We started her on Celexa 20 mg daily two years ago, prior to that, had not been on any antidepressants or anxiolytics in 10 or 15 years.   Past two weeks,  she feels like she is more impatient with people, "snappy."   Denies any tearfulness, anhedonia, changes in appetite or sleep.   No SI or HI.   She has made some proactive changes which have already helped her mood.  She has told her housemates that she cannot do all the laundry and dishes which made her feel better.  Obesity-  She had lost 20 pounds with phentermine and her weight has been slowly increasing. She never had any HA, CP or palpitations with it.  She would like to restart it.  Patient Active Problem List  Diagnosis  . VITAMIN D DEFICIENCY  . CORONARY ARTERY DISEASE  . FOOT PAIN, CHRONIC  . OSTEOPENIA  . PES PLANUS  . DIABETES MELLITUS, HX OF  . HYPERTENSION, HX OF  . Obesity  . Anxiety and depression   Past Medical History  Diagnosis Date  . Coronary artery disease 07/2003  . Obesity 2003    s/p gastric bypass   . Hypertension   . Diabetes mellitus    Past Surgical History  Procedure Date  . Gastric bypass 2003  . Cholecystectomy 1999   History  Substance Use Topics  . Smoking status: Never Smoker   . Smokeless tobacco: Not on file  . Alcohol Use: No   Family History  Problem Relation Age of Onset  . Heart attack Mother   . Heart attack Father    Allergies  Allergen Reactions  . Penicillins     REACTION: As a child.   Current Outpatient Prescriptions on File Prior to Visit  Medication Sig Dispense Refill  . alendronate (FOSAMAX) 70 MG tablet TAKE 1 TABLET BY MOUTH ONCE WEEKLY  4 tablet  5  . cholecalciferol (VITAMIN D) 1000 UNITS tablet Take 1,000 Units by mouth daily.         The PMH, PSH, Social History, Family History, Medications, and allergies have been reviewed in [facility], and have  been updated if relevant.  Review of Systems  See HPI  General: Denies malaise.  Psych: Complains of anxiety, depression, and irritability; denies sense of great danger, suicidal thoughts/plans, thoughts of violence, unusual visions or sounds, and thoughts /plans of harming others.   Physical Exam  BP 120/72  Pulse 72  Temp 98 F (36.7 C)  Wt 172 lb (78.019 kg) Wt Readings from Last 3 Encounters:  02/10/12 172 lb (78.019 kg)  08/19/11 166 lb (75.297 kg)  07/26/11 165 lb (74.844 kg)   General: Well-developed,well-nourished,in no acute distress; alert,appropriate and cooperative throughout examination, very pleasant.  Psych: Cognition and judgment appear intact. Alert and cooperative with normal attention span and concentration. No apparent delusions, illusions, hallucinations  Assessment and Plan: 1. Anxiety and depression  Deteriorated but she does not want to restart medication at this time.  She does have a therapist and she feels she is making pro active changes which are improving her mood.  Given her h/o diabetes, will check some blood work to rule out other possible contributing factors. The patient indicates understanding of these issues and agrees with the plan.  CBC with Differential, Comprehensive metabolic panel, TSH  2. Obesity  Deteriorated.  Discussed risks and benefits again of  phentermine and she would like to restart it.  Rx given.  Follow up in 1 month. The patient indicates understanding of these issues and agrees with the plan.

## 2012-03-16 ENCOUNTER — Encounter: Payer: Self-pay | Admitting: Family Medicine

## 2012-03-16 ENCOUNTER — Ambulatory Visit (INDEPENDENT_AMBULATORY_CARE_PROVIDER_SITE_OTHER): Payer: Medicare Other | Admitting: Family Medicine

## 2012-03-16 VITALS — BP 120/80 | HR 66 | Temp 98.2°F | Wt 172.0 lb

## 2012-03-16 DIAGNOSIS — E669 Obesity, unspecified: Secondary | ICD-10-CM

## 2012-03-16 MED ORDER — PHENTERMINE HCL 37.5 MG PO CAPS: 37.5000 mg | ORAL_CAPSULE | ORAL | Status: DC

## 2012-03-16 NOTE — Progress Notes (Signed)
62 yo here for follow up weight loss.  Restarted phentermine last month.     She had lost 20 pounds with phentermine and her weight has been slowly increasing. She denies any HA, CP or palpitations with it.  Unfortunately did not lose any weight this month and would like to increase dose.  Patient Active Problem List  Diagnosis  . VITAMIN D DEFICIENCY  . CORONARY ARTERY DISEASE  . FOOT PAIN, CHRONIC  . OSTEOPENIA  . PES PLANUS  . DIABETES MELLITUS, HX OF  . HYPERTENSION, HX OF  . Obesity  . Anxiety and depression   Past Medical History  Diagnosis Date  . Coronary artery disease 07/2003  . Obesity 2003    s/p gastric bypass   . Hypertension   . Diabetes mellitus    Past Surgical History  Procedure Laterality Date  . Gastric bypass  2003  . Cholecystectomy  1999   History  Substance Use Topics  . Smoking status: Never Smoker   . Smokeless tobacco: Not on file  . Alcohol Use: No   Family History  Problem Relation Age of Onset  . Heart attack Mother   . Heart attack Father    Allergies  Allergen Reactions  . Penicillins     REACTION: As a child.   Current Outpatient Prescriptions on File Prior to Visit  Medication Sig Dispense Refill  . alendronate (FOSAMAX) 70 MG tablet TAKE 1 TABLET BY MOUTH ONCE WEEKLY  4 tablet  5  . cholecalciferol (VITAMIN D) 1000 UNITS tablet Take 1,000 Units by mouth daily.        . phentermine 30 MG capsule Take 1 capsule (30 mg total) by mouth every morning.  30 capsule  0   No current facility-administered medications on file prior to visit.   The PMH, PSH, Social History, Family History, Medications, and allergies have been reviewed in [facility], and have been updated if relevant.  Review of Systems  See HPI  General: Denies malaise.  Psych: Complains of anxiety, depression, and irritability; denies sense of great danger, suicidal thoughts/plans, thoughts of violence, unusual visions or sounds, and thoughts /plans of harming others.    Physical Exam   BP 120/80  Pulse 66  Temp(Src) 98.2 F (36.8 C) (Oral)  Wt 172 lb (78.019 kg)  BMI 32.52 kg/m2  SpO2 96%  Wt Readings from Last 3 Encounters:  03/16/12 172 lb (78.019 kg)  02/10/12 172 lb (78.019 kg)  08/19/11 166 lb (75.297 kg)     General: Well-developed,well-nourished,in no acute distress; alert,appropriate and cooperative throughout examination, very pleasant.  Psych: Cognition and judgment appear intact. Alert and cooperative with normal attention span and concentration. No apparent delusions, illusions, hallucinations  Assessment and Plan:  1. Obesity Increase phentermine to 37.5 mg daily. Follow up in one month.

## 2012-04-26 ENCOUNTER — Encounter: Payer: Self-pay | Admitting: Family Medicine

## 2012-04-26 ENCOUNTER — Ambulatory Visit (INDEPENDENT_AMBULATORY_CARE_PROVIDER_SITE_OTHER): Payer: Medicare Other | Admitting: Family Medicine

## 2012-04-26 VITALS — BP 132/82 | HR 57 | Temp 98.1°F | Wt 168.0 lb

## 2012-04-26 DIAGNOSIS — E669 Obesity, unspecified: Secondary | ICD-10-CM

## 2012-04-26 MED ORDER — PHENTERMINE HCL 37.5 MG PO CAPS: 37.5000 mg | ORAL_CAPSULE | ORAL | Status: DC

## 2012-04-26 NOTE — Progress Notes (Signed)
62 yo here for follow up weight loss. Taking phentermine 37.5 mg daily.    She denies any HA, CP or palpitations with it.  She has lost 5 pounds according to her home scale and feels great.  Patient Active Problem List  Diagnosis  . VITAMIN D DEFICIENCY  . CORONARY ARTERY DISEASE  . FOOT PAIN, CHRONIC  . OSTEOPENIA  . PES PLANUS  . DIABETES MELLITUS, HX OF  . HYPERTENSION, HX OF  . Obesity  . Anxiety and depression   Past Medical History  Diagnosis Date  . Coronary artery disease 07/2003  . Obesity 2003    s/p gastric bypass   . Hypertension   . Diabetes mellitus    Past Surgical History  Procedure Laterality Date  . Gastric bypass  2003  . Cholecystectomy  1999   History  Substance Use Topics  . Smoking status: Never Smoker   . Smokeless tobacco: Not on file  . Alcohol Use: No   Family History  Problem Relation Age of Onset  . Heart attack Mother   . Heart attack Father    Allergies  Allergen Reactions  . Penicillins     REACTION: As a child.   Current Outpatient Prescriptions on File Prior to Visit  Medication Sig Dispense Refill  . alendronate (FOSAMAX) 70 MG tablet TAKE 1 TABLET BY MOUTH ONCE WEEKLY  4 tablet  5  . cholecalciferol (VITAMIN D) 1000 UNITS tablet Take 1,000 Units by mouth daily.        . phentermine 37.5 MG capsule Take 1 capsule (37.5 mg total) by mouth every morning.  30 capsule  0   No current facility-administered medications on file prior to visit.   The PMH, PSH, Social History, Family History, Medications, and allergies have been reviewed in [facility], and have been updated if relevant.  Review of Systems  See HPI  General: Denies malaise.  Psych: Complains of anxiety, depression, and irritability; denies sense of great danger, suicidal thoughts/plans, thoughts of violence, unusual visions or sounds, and thoughts /plans of harming others.   Physical Exam          BP 132/82  Pulse 57  Temp(Src) 98.1 F (36.7 C)  Wt 168 lb (76.204 kg)   BMI 31.76 kg/m2                   Wt Readings from Last 3 Encounters:  04/26/12 168 lb (76.204 kg)  03/16/12 172 lb (78.019 kg)  02/10/12 172 lb (78.019 kg)     General: Well-developed,well-nourished,in no acute distress; alert,appropriate and cooperative throughout examination, very pleasant.  Psych: Cognition and judgment appear intact. Alert and cooperative with normal attention span and concentration. No apparent delusions, illusions, hallucinations  Assessment and Plan:  1. Obesity  Improving. Normotensive.  Phentermine refilled. Follow up in 1 month. The patient indicates understanding of these issues and agrees with the plan.

## 2012-06-25 ENCOUNTER — Other Ambulatory Visit: Payer: Self-pay | Admitting: Family Medicine

## 2012-12-02 ENCOUNTER — Other Ambulatory Visit: Payer: Self-pay | Admitting: Family Medicine

## 2013-04-18 ENCOUNTER — Encounter: Payer: Self-pay | Admitting: Family Medicine

## 2013-04-18 ENCOUNTER — Ambulatory Visit (INDEPENDENT_AMBULATORY_CARE_PROVIDER_SITE_OTHER): Payer: Medicare Other | Admitting: Family Medicine

## 2013-04-18 VITALS — BP 126/72 | HR 64 | Temp 98.1°F | Ht 60.0 in | Wt 179.2 lb

## 2013-04-18 DIAGNOSIS — E550 Rickets, active: Secondary | ICD-10-CM

## 2013-04-18 DIAGNOSIS — E669 Obesity, unspecified: Secondary | ICD-10-CM

## 2013-04-18 DIAGNOSIS — D509 Iron deficiency anemia, unspecified: Secondary | ICD-10-CM

## 2013-04-18 DIAGNOSIS — R5381 Other malaise: Secondary | ICD-10-CM

## 2013-04-18 DIAGNOSIS — R5383 Other fatigue: Principal | ICD-10-CM

## 2013-04-18 LAB — CBC WITH DIFFERENTIAL/PLATELET
BASOS PCT: 0.4 % (ref 0.0–3.0)
Basophils Absolute: 0 10*3/uL (ref 0.0–0.1)
Eosinophils Absolute: 0.1 10*3/uL (ref 0.0–0.7)
Eosinophils Relative: 0.6 % (ref 0.0–5.0)
HEMATOCRIT: 34.7 % — AB (ref 36.0–46.0)
HEMOGLOBIN: 11.4 g/dL — AB (ref 12.0–15.0)
LYMPHS PCT: 16.8 % (ref 12.0–46.0)
Lymphs Abs: 1.5 10*3/uL (ref 0.7–4.0)
MCHC: 32.8 g/dL (ref 30.0–36.0)
MCV: 88.2 fl (ref 78.0–100.0)
MONO ABS: 0.7 10*3/uL (ref 0.1–1.0)
MONOS PCT: 7.5 % (ref 3.0–12.0)
Neutro Abs: 6.5 10*3/uL (ref 1.4–7.7)
Neutrophils Relative %: 74.7 % (ref 43.0–77.0)
PLATELETS: 251 10*3/uL (ref 150.0–400.0)
RBC: 3.93 Mil/uL (ref 3.87–5.11)
RDW: 14.3 % (ref 11.5–14.6)
WBC: 8.8 10*3/uL (ref 4.5–10.5)

## 2013-04-18 LAB — COMPREHENSIVE METABOLIC PANEL
ALT: 28 U/L (ref 0–35)
AST: 22 U/L (ref 0–37)
Albumin: 3.4 g/dL — ABNORMAL LOW (ref 3.5–5.2)
Alkaline Phosphatase: 73 U/L (ref 39–117)
BUN: 15 mg/dL (ref 6–23)
CALCIUM: 8.9 mg/dL (ref 8.4–10.5)
CHLORIDE: 110 meq/L (ref 96–112)
CO2: 27 meq/L (ref 19–32)
CREATININE: 0.6 mg/dL (ref 0.4–1.2)
GFR: 108.04 mL/min (ref >60.00)
Glucose, Bld: 64 mg/dL — ABNORMAL LOW (ref 70–99)
Potassium: 3.4 mEq/L — ABNORMAL LOW (ref 3.5–5.1)
Sodium: 141 mEq/L (ref 135–145)
Total Bilirubin: 0.6 mg/dL (ref 0.3–1.2)
Total Protein: 6.4 g/dL (ref 6.0–8.3)

## 2013-04-18 LAB — IBC PANEL
Iron: 47 ug/dL (ref 42–145)
Saturation Ratios: 10.3 % — ABNORMAL LOW (ref 20.0–50.0)
TRANSFERRIN: 326.7 mg/dL (ref 212.0–360.0)

## 2013-04-18 LAB — TSH: TSH: 1.1 u[IU]/mL (ref 0.35–5.50)

## 2013-04-18 LAB — VITAMIN B12: Vitamin B-12: 758 pg/mL (ref 211–911)

## 2013-04-18 MED ORDER — PHENTERMINE HCL 15 MG PO CAPS: ORAL_CAPSULE | ORAL | Status: DC

## 2013-04-18 NOTE — Assessment & Plan Note (Signed)
>  25 minutes spent in face to face time with patient, >50% spent in counselling or coordination of care Check labs today, concerning for anemia. Orders Placed This Encounter  Procedures  . CBC with Differential  . TSH  . Vitamin D, 25-hydroxy  . Vitamin B12  . Iron and TIBC  . Comprehensive metabolic panel

## 2013-04-18 NOTE — Patient Instructions (Signed)
Good to see you. I will call you with your lab results.   

## 2013-04-18 NOTE — Progress Notes (Signed)
Pre visit review using our clinic review tool, if applicable. No additional management support is needed unless otherwise documented below in the visit note. 

## 2013-04-18 NOTE — Assessment & Plan Note (Signed)
Discussed weight loss plan.  Pt would also like to restart phentermine- discussed phentermine risk benefits, side effects including HTN, pulmonary HTN, stroke.     Advised to wait until lab results returned.  Rx given. Follow up in 1 month.  If BMI < 27 will decrease to half dose x 1 month then stop

## 2013-04-18 NOTE — Addendum Note (Signed)
Addended by: Ellamae Sia on: 04/18/2013 10:35 AM   Modules accepted: Orders

## 2013-04-18 NOTE — Progress Notes (Signed)
   Subjective:   Patient ID: Tonya [name], female    DOB: [birth year], 63 y.o.   MRN: [id number]  Shavaun Osterloh is a pleasant 63 y.o. year old female who presents to clinic today with Fatigue  on 04/18/2013  HPI: Past several weeks she feels "off." Very fatigued.  Not sleeping well because she has been under more stress trying to sell her house but she feels this more than just fatigue associated with insomnia.  Feels "washed out."  No blood in stool. Colonoscopy 2010.  Denies any CP or SOB.  Weight gain-  Appetite has been "too good." Wt Readings from Last 3 Encounters:  04/18/13 179 lb 4 oz (81.307 kg)  04/26/12 168 lb (76.204 kg)  03/16/12 172 lb (78.019 kg)   Wants to restart phentermine.  Patient Active Problem List   Diagnosis Date Noted  . Other malaise and fatigue 04/18/2013  . Anxiety and depression 02/10/2012  . Obesity 01/28/2011  . OSTEOPENIA 05/07/2009  . VITAMIN D DEFICIENCY 11/15/2008  . CORONARY ARTERY DISEASE 11/15/2008  . FOOT PAIN, CHRONIC 11/15/2008  . PES PLANUS 11/15/2008  . DIABETES MELLITUS, HX OF 11/15/2008  . HYPERTENSION, HX OF 11/15/2008   Past Medical History  Diagnosis Date  . Coronary artery disease 07/2003  . Obesity 2003    s/p gastric bypass   . Hypertension   . Diabetes mellitus    Past Surgical History  Procedure Laterality Date  . Gastric bypass  2003  . Cholecystectomy  1999   History  Substance Use Topics  . Smoking status: Never Smoker   . Smokeless tobacco: Not on file  . Alcohol Use: No   Family History  Problem Relation Age of Onset  . Heart attack Mother   . Heart attack Father    Allergies  Allergen Reactions  . Penicillins     REACTION: As a child.   Current Outpatient Prescriptions on File Prior to Visit  Medication Sig Dispense Refill  . alendronate (FOSAMAX) 70 MG tablet TAKE 1 TABLET BY MOUTH ONCE WEEKLY  4 tablet  5  . aspirin 81 MG tablet Take 81 mg by mouth daily.      .  cholecalciferol (VITAMIN D) 1000 UNITS tablet Take 1,000 Units by mouth daily.         No current facility-administered medications on file prior to visit.   The PMH, PSH, Social History, Family History, Medications, and allergies have been reviewed in [facility], and have been updated if relevant.    Review of Systems    See HPI +intermittent dizziness when standing No blurred vision No LE edema Objective:    BP 126/72  Pulse 64  Temp(Src) 98.1 F (36.7 C) (Oral)  Ht 5' (1.524 m)  Wt 179 lb 4 oz (81.307 kg)  BMI 35.01 kg/m2  SpO2 98%   Physical Exam Gen:  Does appear pale today Resp:  CTA bilaterally CVS:  RRR Ext:  Some nail pitting No edema       Assessment & Plan:   Other malaise and fatigue - Plan: CBC with Differential, TSH, Vitamin D, 25-hydroxy, Vitamin B12, Iron and TIBC, Comprehensive metabolic panel  Obesity Return in about 1 month (around 05/18/2013) for medication follow up.Marland Kitchen

## 2013-04-19 LAB — VITAMIN D 25 HYDROXY (VIT D DEFICIENCY, FRACTURES): Vit D, 25-Hydroxy: 22 ng/mL — ABNORMAL LOW (ref 30–89)

## 2013-04-20 MED ORDER — FERROUS SULFATE 325 (65 FE) MG PO TABS: 325.0000 mg | ORAL_TABLET | Freq: Every day | ORAL | Status: DC

## 2013-04-20 MED ORDER — VITAMIN D (ERGOCALCIFEROL) 1.25 MG (50000 UNIT) PO CAPS: 50000.0000 [IU] | ORAL_CAPSULE | ORAL | Status: DC

## 2013-04-20 NOTE — Addendum Note (Signed)
Addended by: Modena Nunnery on: 04/20/2013 09:15 AM   Modules accepted: Orders

## 2013-04-25 ENCOUNTER — Telehealth: Payer: Self-pay | Admitting: *Deleted

## 2013-04-25 ENCOUNTER — Ambulatory Visit (HOSPITAL_COMMUNITY): Payer: Medicare Other | Attending: Internal Medicine | Admitting: Cardiology

## 2013-04-25 ENCOUNTER — Encounter: Payer: Self-pay | Admitting: Family Medicine

## 2013-04-25 ENCOUNTER — Other Ambulatory Visit (HOSPITAL_COMMUNITY): Payer: Self-pay | Admitting: Cardiology

## 2013-04-25 ENCOUNTER — Ambulatory Visit (INDEPENDENT_AMBULATORY_CARE_PROVIDER_SITE_OTHER): Payer: Medicare Other | Admitting: Family Medicine

## 2013-04-25 VITALS — BP 126/72 | HR 62 | Temp 98.0°F | Wt 177.0 lb

## 2013-04-25 DIAGNOSIS — M79609 Pain in unspecified limb: Secondary | ICD-10-CM

## 2013-04-25 DIAGNOSIS — M79604 Pain in right leg: Secondary | ICD-10-CM | POA: Insufficient documentation

## 2013-04-25 MED ORDER — SULFAMETHOXAZOLE-TMP DS 800-160 MG PO TABS: ORAL_TABLET | ORAL | Status: DC

## 2013-04-25 NOTE — Progress Notes (Signed)
Pre visit review using our clinic review tool, if applicable. No additional management support is needed unless otherwise documented below in the visit note. 

## 2013-04-25 NOTE — Patient Instructions (Signed)
Great to see you. Please stop by to see Rosaria Ferries on your way out to set up your ultrasound in [place]. I will call you with the results.

## 2013-04-25 NOTE — Progress Notes (Signed)
Venous duplex limited complete 

## 2013-04-25 NOTE — Telephone Encounter (Signed)
Message copied by Modena Nunnery on Wed Apr 25, 2013 10:20 AM ------      Message from: Margarite Gouge H      Created: Wed Apr 25, 2013 10:14 AM      Regarding: RE: prelim       i attempted to contact pt but no answer. vm left requesting a call back      ----- Message -----         From: Lucille Passy, MD         Sent: 04/25/2013  10:01 AM           To: Modena Nunnery, CMA      Subject: FW: prelim                                               Please call pt ASAP to let her know that prelim report shows no blood clots which is great news!       ----- Message -----         From: Illene Silver         Sent: 04/25/2013   9:38 AM           To: Lucille Passy, MD      Subject: prelim                                                   Right lower venous duplex appears negative for acute thrombus             ------

## 2013-04-25 NOTE — Assessment & Plan Note (Addendum)
Probable cellulitis. PCN allergic- will place on Bactrim. Very low risk but need to rule out DVT- doppler ordered.  She has follow up with me next week. Orders Placed This Encounter  Procedures  . Lower Extremity Venous Duplex Right

## 2013-04-25 NOTE — Progress Notes (Signed)
Subjective:   Patient ID: Tonya [name], female    DOB: 1950-07-16, 63 y.o.   MRN: [id number]  Tonya [name] is a pleasant 63 y.o. year old female who presents to clinic today with Leg Pain  on 04/25/2013  HPI: Feel about a week ago on her right shin and side.  Had a big bruise which is healing on her right shin but now below that area it is very tender and warm.  No abrasion or drainage from site.  No fevers, chills, nausea or vomiting.  No recent travel.  No CP or SOB.  Not taking hormones.  Non smoker. No h/o DVTs.  Patient Active Problem List   Diagnosis Date Noted  . Right leg pain 04/25/2013  . Other malaise and fatigue 04/18/2013  . Anxiety and depression 02/10/2012  . Obesity 01/28/2011  . OSTEOPENIA 05/07/2009  . VITAMIN D DEFICIENCY 11/15/2008  . CORONARY ARTERY DISEASE 11/15/2008  . FOOT PAIN, CHRONIC 11/15/2008  . PES PLANUS 11/15/2008  . DIABETES MELLITUS, HX OF 11/15/2008  . HYPERTENSION, HX OF 11/15/2008   Past Medical History  Diagnosis Date  . Coronary artery disease 07/2003  . Obesity 2003    s/p gastric bypass   . Hypertension   . Diabetes mellitus    Past Surgical History  Procedure Laterality Date  . Gastric bypass  2003  . Cholecystectomy  1999   History  Substance Use Topics  . Smoking status: Never Smoker   . Smokeless tobacco: Not on file  . Alcohol Use: No   Family History  Problem Relation Age of Onset  . Heart attack Mother   . Heart attack Father    Allergies  Allergen Reactions  . Penicillins     REACTION: As a child.   Current Outpatient Prescriptions on File Prior to Visit  Medication Sig Dispense Refill  . alendronate (FOSAMAX) 70 MG tablet TAKE 1 TABLET BY MOUTH ONCE WEEKLY  4 tablet  5  . aspirin 81 MG tablet Take 81 mg by mouth daily.      . cholecalciferol (VITAMIN D) 1000 UNITS tablet Take 1,000 Units by mouth daily.        . ferrous sulfate (FERROUSUL) 325 (65 FE) MG tablet Take 1 tablet (325 mg  total) by mouth daily with breakfast.  30 tablet  3  . phentermine 15 MG capsule 1 tab po daily x 2 weeks, then increase to 30 mg daily dose.  14 capsule  0  . Vitamin D, Ergocalciferol, (DRISDOL) 50000 UNITS CAPS capsule Take 1 capsule (50,000 Units total) by mouth every 7 (seven) days.  6 capsule  0   No current facility-administered medications on file prior to visit.   The PMH, PSH, Social History, Family History, Medications, and allergies have been reviewed in [facility], and have been updated if relevant.   Review of Systems    See HPI  Objective:    BP 126/72  Pulse 62  Temp(Src) 98 F (36.7 C) (Oral)  Wt 177 lb (80.287 kg)  SpO2 96%   Physical Exam  Nursing note and vitals reviewed. Constitutional: She appears well-developed and well-nourished. No distress.  HENT:  Head: Normocephalic and atraumatic.  Skin:     Psychiatric: She has a normal mood and affect. Her speech is normal and behavior is normal. Judgment and thought content normal. Cognition and memory are normal.          Assessment & Plan:   Right leg pain - Plan:  Lower Extremity Venous Duplex Right No Follow-up on file.

## 2013-04-30 ENCOUNTER — Ambulatory Visit (INDEPENDENT_AMBULATORY_CARE_PROVIDER_SITE_OTHER): Payer: Medicare Other | Admitting: Family Medicine

## 2013-04-30 ENCOUNTER — Encounter: Payer: Self-pay | Admitting: Family Medicine

## 2013-04-30 VITALS — BP 128/70 | HR 91 | Temp 99.8°F | Wt 171.8 lb

## 2013-04-30 DIAGNOSIS — R112 Nausea with vomiting, unspecified: Secondary | ICD-10-CM

## 2013-04-30 MED ORDER — PROMETHAZINE HCL 12.5 MG PO TABS: ORAL_TABLET | ORAL | Status: DC

## 2013-04-30 MED ORDER — PHENTERMINE HCL 15 MG PO CAPS: ORAL_CAPSULE | ORAL | Status: DC

## 2013-04-30 NOTE — Progress Notes (Signed)
Pre visit review using our clinic review tool, if applicable. No additional management support is needed unless otherwise documented below in the visit note. 

## 2013-04-30 NOTE — Patient Instructions (Signed)
Viral Gastroenteritis Viral gastroenteritis is also known as stomach flu. This condition affects the stomach and intestinal tract. It can cause sudden diarrhea and vomiting. The illness typically lasts 3 to 8 days. Most people develop an immune response that eventually gets rid of the virus. While this natural response develops, the virus can make you quite ill. CAUSES  Many different viruses can cause gastroenteritis, such as rotavirus or noroviruses. You can catch one of these viruses by consuming contaminated food or water. You may also catch a virus by sharing utensils or other personal items with an infected person or by touching a contaminated surface. SYMPTOMS  The most common symptoms are diarrhea and vomiting. These problems can cause a severe loss of body fluids (dehydration) and a body salt (electrolyte) imbalance. Other symptoms may include:  Fever.  Headache.  Fatigue.  Abdominal pain. DIAGNOSIS  Your caregiver can usually diagnose viral gastroenteritis based on your symptoms and a physical exam. A stool sample may also be taken to test for the presence of viruses or other infections. TREATMENT  This illness typically goes away on its own. Treatments are aimed at rehydration. The most serious cases of viral gastroenteritis involve vomiting so severely that you are not able to keep fluids down. In these cases, fluids must be given through an intravenous line (IV). HOME CARE INSTRUCTIONS   Drink enough fluids to keep your urine clear or pale yellow. Drink small amounts of fluids frequently and increase the amounts as tolerated.  Ask your caregiver for specific rehydration instructions.  Avoid:  Foods high in sugar.  Alcohol.  Carbonated drinks.  Tobacco.  Juice.  Caffeine drinks.  Extremely hot or cold fluids.  Fatty, greasy foods.  Too much intake of anything at one time.  Dairy products until 24 to 48 hours after diarrhea stops.  You may consume probiotics.  Probiotics are active cultures of beneficial bacteria. They may lessen the amount and number of diarrheal stools in adults. Probiotics can be found in yogurt with active cultures and in supplements.  Wash your hands well to avoid spreading the virus.  Only take over-the-counter or prescription medicines for pain, discomfort, or fever as directed by your caregiver. Do not give aspirin to children. Antidiarrheal medicines are not recommended.  Ask your caregiver if you should continue to take your regular prescribed and over-the-counter medicines.  Keep all follow-up appointments as directed by your caregiver. SEEK IMMEDIATE MEDICAL CARE IF:   You are unable to keep fluids down.  You do not urinate at least once every 6 to 8 hours.  You develop shortness of breath.  You notice blood in your stool or vomit. This may look like coffee grounds.  You have abdominal pain that increases or is concentrated in one small area (localized).  You have persistent vomiting or diarrhea.  You have a fever.  The patient is a child younger than 3 months, and he or she has a fever.  The patient is a child older than 3 months, and he or she has a fever and persistent symptoms.  The patient is a child older than 3 months, and he or she has a fever and symptoms suddenly get worse.  The patient is a baby, and he or she has no tears when crying. MAKE SURE YOU:   Understand these instructions.  Will watch your condition.  Will get help right away if you are not doing well or get worse. Document Released: 12/21/2004 Document Revised: 03/15/2011 Document Reviewed: 10/07/2010   [employer] Patient Information 2014 [employer].  

## 2013-04-30 NOTE — Progress Notes (Addendum)
(  S) Tonya [name] is a 63 y.o. female with complaint of gastrointestinal symptoms of fevers, nausea, vomiting, anorexia for 2 days. No blood in stool.  Patient Active Problem List   Diagnosis Date Noted  . Right leg pain 04/25/2013  . Other malaise and fatigue 04/18/2013  . Anxiety and depression 02/10/2012  . Obesity 01/28/2011  . OSTEOPENIA 05/07/2009  . VITAMIN D DEFICIENCY 11/15/2008  . CORONARY ARTERY DISEASE 11/15/2008  . FOOT PAIN, CHRONIC 11/15/2008  . PES PLANUS 11/15/2008  . DIABETES MELLITUS, HX OF 11/15/2008  . HYPERTENSION, HX OF 11/15/2008   Past Medical History  Diagnosis Date  . Coronary artery disease 07/2003  . Obesity 2003    s/p gastric bypass   . Hypertension   . Diabetes mellitus    Past Surgical History  Procedure Laterality Date  . Gastric bypass  2003  . Cholecystectomy  1999   History  Substance Use Topics  . Smoking status: Never Smoker   . Smokeless tobacco: Not on file  . Alcohol Use: No   Family History  Problem Relation Age of Onset  . Heart attack Mother   . Heart attack Father    Allergies  Allergen Reactions  . Penicillins     REACTION: As a child.   Current Outpatient Prescriptions on File Prior to Visit  Medication Sig Dispense Refill  . alendronate (FOSAMAX) 70 MG tablet TAKE 1 TABLET BY MOUTH ONCE WEEKLY  4 tablet  5  . aspirin 81 MG tablet Take 81 mg by mouth daily.      . cholecalciferol (VITAMIN D) 1000 UNITS tablet Take 1,000 Units by mouth daily.        . ferrous sulfate (FERROUSUL) 325 (65 FE) MG tablet Take 1 tablet (325 mg total) by mouth daily with breakfast.  30 tablet  3  . sulfamethoxazole-trimethoprim (BACTRIM DS) 800-160 MG per tablet 1 tablet twice daily x 7- 10 days  20 tablet  0  . Vitamin D, Ergocalciferol, (DRISDOL) 50000 UNITS CAPS capsule Take 1 capsule (50,000 Units total) by mouth every 7 (seven) days.  6 capsule  0   No current facility-administered medications on file prior to visit.   The  PMH, PSH, Social History, Family History, Medications, and allergies have been reviewed in [facility], and have been updated if relevant.  (O)  BP 128/70  Pulse 91  Temp(Src) 99.8 F (37.7 C) (Oral)  Wt 171 lb 12 oz (77.905 kg)  SpO2 99% Physical exam reveals the patient appears well. Hydration status: mildly dehydrated. Abdomen: abdomen is soft without significant tenderness, masses, organomegaly or guarding..  (A) Viral Gastroenteritis  (P) Phenergan 12.5- 25 mg po every 8 hours as needed for nausea or vomiting.  I have recommended small amounts clear fluids frequently, soups, juices, water and advance diet as tolerated. Return office visit if symptoms persist or worsen; I have alerted the patient to call if high fever, dehydration, marked weakness, fainting, increased abdominal pain, blood in stool or vomit.  Addendum- I did ask her to stop taking bactrim as I feel her symptoms unlikely related to allergic rxn from bactrim given fever but given her GI symptoms of nausea and vomiting, it would likely worsen her symptoms.  Once her symptoms resolve, if her cellulitis returns, will add another abx.

## 2013-05-01 ENCOUNTER — Telehealth: Payer: Self-pay | Admitting: Family Medicine

## 2013-05-01 NOTE — Telephone Encounter (Signed)
I am glad she is feeling better.  Thanks for the update.

## 2013-05-01 NOTE — Telephone Encounter (Signed)
Patient Information:  Caller Name: Tonya [name]  Phone: [phone]  Patient: [name], [name]  Gender: Female  DOB: 08-Jan-1950  Age: 63 Years  PCP: [facility])  Office Follow Up:  Does the office need to follow up with this patient?: No  Instructions For The Office: N/A  RN Note:  Macular, splotchy, generalized rash with pruritus. Denies hives.  No emesis since 04/29/13.  Phenergan ordered 04/30/13 helped nausea.  Stopped Phenergan after 2100 dose 04/30/13.  Temp not taken; refused to take it now. No facial edema difficulty swallowing or breathing problems.  Ambulatory.  Upset with triage questions.  Reported "I dont know why I even called."  Declined to schedule appointment due to distance and "cannot drive" today.  Suggested coll shower or bath. Reviewed emergent symptoms that would require immediate care.  Verbalized understanding.  Symptoms  Reason For Call & Symptoms: Suspected allergic reaction to Phenergan ordered 04/30/13.  Reports generalized, macular, itchy rash.  Last dose was 2100.  Seen 04/30/13 for fever, nausea, vomiting and anorexia.  Reviewed Health History In EMR: Yes  Reviewed Medications In EMR: Yes  Reviewed Allergies In EMR: Yes  Reviewed Surgeries / Procedures: Yes  Date of Onset of Symptoms: 05/01/2013  Treatments Tried: Stopped taking Phenergan  Treatments Tried Worked: No  Guideline(s) Used:  Rash - Widespread on Drugs - Drug Reaction  Disposition Per Guideline:   See Today in Office  Reason For Disposition Reached:   All other patients with widespread rash taking a new medication  Advice Given:  Reassurance:  There are many causes of widespread rashes and most of the time they are not serious. Common causes include viral illness (e.g., cold viruses) and allergic reactions (to a food, medicine, or environmental exposure).  Stopping the Medication:  If rash is hives or is very itchy - stop the medication. Reason: possible allergy.  Oral  Antihistamine Medication for Itching:  Take an antihistamine like diphenhydramine (Benadryl) for widespread rashes that itch. The adult dosage of Benadryl is 25-50 mg by mouth 4 times daily.  An over-the-counter antihistamine that causes less sleepiness is loratadine (e.g., Alavert or Claritin).  CAUTION: This type of medication may cause sleepiness. Do not drink alcohol, drive, or operate dangerous machinery while taking antihistamines. Do not take these medications if you have prostate problems.  Oatmeal Aveeno Bath for Itching:  Pat dry with a towel. Do not rub the rash.  Call Back If:  You become worse.  Patient Refused Recommendation:  Patient Refused Care Advice  Declined appointment

## 2013-05-01 NOTE — Telephone Encounter (Signed)
Spoke to pt who states that she is feeling "much better" and is not wanting to try any additional nausea medication

## 2013-05-01 NOTE — Telephone Encounter (Signed)
Please call to check on pt.  Phenergan added to allergy list. Please ask her if she would like try zofran for nausea.  How is she feeling?

## 2013-05-02 ENCOUNTER — Encounter (HOSPITAL_COMMUNITY): Payer: Self-pay | Admitting: Emergency Medicine

## 2013-05-02 ENCOUNTER — Inpatient Hospital Stay (HOSPITAL_COMMUNITY)
Admission: EM | Admit: 2013-05-02 | Discharge: 2013-05-05 | DRG: 871 | Disposition: A | Discharge status: Home / Self Care | Payer: Medicare Other | Attending: Family Medicine | Admitting: Family Medicine

## 2013-05-02 DIAGNOSIS — I959 Hypotension, unspecified: Secondary | ICD-10-CM | POA: Diagnosis present

## 2013-05-02 DIAGNOSIS — F32A Depression, unspecified: Secondary | ICD-10-CM

## 2013-05-02 DIAGNOSIS — I10 Essential (primary) hypertension: Secondary | ICD-10-CM | POA: Diagnosis present

## 2013-05-02 DIAGNOSIS — E119 Type 2 diabetes mellitus without complications: Secondary | ICD-10-CM | POA: Diagnosis present

## 2013-05-02 DIAGNOSIS — L282 Other prurigo: Secondary | ICD-10-CM

## 2013-05-02 DIAGNOSIS — M899 Disorder of bone, unspecified: Secondary | ICD-10-CM

## 2013-05-02 DIAGNOSIS — T370X5A Adverse effect of sulfonamides, initial encounter: Secondary | ICD-10-CM | POA: Diagnosis present

## 2013-05-02 DIAGNOSIS — Z8679 Personal history of other diseases of the circulatory system: Secondary | ICD-10-CM | POA: Diagnosis present

## 2013-05-02 DIAGNOSIS — R6520 Severe sepsis without septic shock: Secondary | ICD-10-CM

## 2013-05-02 DIAGNOSIS — IMO0001 Reserved for inherently not codable concepts without codable children: Secondary | ICD-10-CM | POA: Diagnosis present

## 2013-05-02 DIAGNOSIS — R6521 Severe sepsis with septic shock: Secondary | ICD-10-CM

## 2013-05-02 DIAGNOSIS — L02419 Cutaneous abscess of limb, unspecified: Secondary | ICD-10-CM | POA: Diagnosis present

## 2013-05-02 DIAGNOSIS — Z862 Personal history of diseases of the blood and blood-forming organs and certain disorders involving the immune mechanism: Secondary | ICD-10-CM | POA: Diagnosis present

## 2013-05-02 DIAGNOSIS — L03119 Cellulitis of unspecified part of limb: Secondary | ICD-10-CM | POA: Diagnosis present

## 2013-05-02 DIAGNOSIS — Z9884 Bariatric surgery status: Secondary | ICD-10-CM

## 2013-05-02 DIAGNOSIS — R6510 Systemic inflammatory response syndrome (SIRS) of non-infectious origin without acute organ dysfunction: Secondary | ICD-10-CM | POA: Diagnosis present

## 2013-05-02 DIAGNOSIS — E559 Vitamin D deficiency, unspecified: Secondary | ICD-10-CM | POA: Diagnosis present

## 2013-05-02 DIAGNOSIS — A419 Sepsis, unspecified organism: Principal | ICD-10-CM | POA: Diagnosis present

## 2013-05-02 DIAGNOSIS — M2140 Flat foot [pes planus] (acquired), unspecified foot: Secondary | ICD-10-CM

## 2013-05-02 DIAGNOSIS — R5381 Other malaise: Secondary | ICD-10-CM

## 2013-05-02 DIAGNOSIS — I2510 Atherosclerotic heart disease of native coronary artery without angina pectoris: Secondary | ICD-10-CM

## 2013-05-02 DIAGNOSIS — E8809 Other disorders of plasma-protein metabolism, not elsewhere classified: Secondary | ICD-10-CM | POA: Diagnosis present

## 2013-05-02 DIAGNOSIS — M949 Disorder of cartilage, unspecified: Secondary | ICD-10-CM

## 2013-05-02 DIAGNOSIS — M79604 Pain in right leg: Secondary | ICD-10-CM

## 2013-05-02 DIAGNOSIS — R5383 Other fatigue: Secondary | ICD-10-CM

## 2013-05-02 DIAGNOSIS — T7840XA Allergy, unspecified, initial encounter: Secondary | ICD-10-CM | POA: Diagnosis present

## 2013-05-02 DIAGNOSIS — F419 Anxiety disorder, unspecified: Secondary | ICD-10-CM

## 2013-05-02 DIAGNOSIS — E46 Unspecified protein-calorie malnutrition: Secondary | ICD-10-CM | POA: Diagnosis present

## 2013-05-02 DIAGNOSIS — L299 Pruritus, unspecified: Secondary | ICD-10-CM | POA: Diagnosis present

## 2013-05-02 DIAGNOSIS — N179 Acute kidney failure, unspecified: Secondary | ICD-10-CM | POA: Diagnosis present

## 2013-05-02 DIAGNOSIS — E669 Obesity, unspecified: Secondary | ICD-10-CM

## 2013-05-02 DIAGNOSIS — Z8639 Personal history of other endocrine, nutritional and metabolic disease: Secondary | ICD-10-CM

## 2013-05-02 DIAGNOSIS — F329 Major depressive disorder, single episode, unspecified: Secondary | ICD-10-CM

## 2013-05-02 DIAGNOSIS — M79609 Pain in unspecified limb: Secondary | ICD-10-CM

## 2013-05-02 DIAGNOSIS — L03115 Cellulitis of right lower limb: Secondary | ICD-10-CM

## 2013-05-02 DIAGNOSIS — Z8249 Family history of ischemic heart disease and other diseases of the circulatory system: Secondary | ICD-10-CM

## 2013-05-02 DIAGNOSIS — D72829 Elevated white blood cell count, unspecified: Secondary | ICD-10-CM | POA: Diagnosis present

## 2013-05-02 LAB — CBC WITH DIFFERENTIAL/PLATELET
BASOS PCT: 0 % (ref 0–1)
Basophils Absolute: 0 10*3/uL (ref 0.0–0.1)
EOS ABS: 0.1 10*3/uL (ref 0.0–0.7)
EOS PCT: 0 % (ref 0–5)
HCT: 35.9 % — ABNORMAL LOW (ref 36.0–46.0)
Hemoglobin: 11.8 g/dL — ABNORMAL LOW (ref 12.0–15.0)
Lymphocytes Relative: 1 % — ABNORMAL LOW (ref 12–46)
Lymphs Abs: 0.2 10*3/uL — ABNORMAL LOW (ref 0.7–4.0)
MCH: 29 pg (ref 26.0–34.0)
MCHC: 32.9 g/dL (ref 30.0–36.0)
MCV: 88.2 fL (ref 78.0–100.0)
Monocytes Absolute: 0.5 10*3/uL (ref 0.1–1.0)
Monocytes Relative: 3 % (ref 3–12)
NEUTROS PCT: 96 % — AB (ref 43–77)
Neutro Abs: 17.2 10*3/uL — ABNORMAL HIGH (ref 1.7–7.7)
PLATELETS: 163 10*3/uL (ref 150–400)
RBC: 4.07 MIL/uL (ref 3.87–5.11)
RDW: 13.6 % (ref 11.5–15.5)
WBC: 18 10*3/uL — ABNORMAL HIGH (ref 4.0–10.5)

## 2013-05-02 LAB — HEPATIC FUNCTION PANEL
ALK PHOS: 122 U/L — AB (ref 39–117)
ALT: 89 U/L — ABNORMAL HIGH (ref 0–35)
AST: 44 U/L — ABNORMAL HIGH (ref 0–37)
Albumin: 2.7 g/dL — ABNORMAL LOW (ref 3.5–5.2)
BILIRUBIN TOTAL: 0.7 mg/dL (ref 0.3–1.2)
Bilirubin, Direct: 0.3 mg/dL (ref 0.0–0.3)
Indirect Bilirubin: 0.4 mg/dL (ref 0.3–0.9)
Total Protein: 6.1 g/dL (ref 6.0–8.3)

## 2013-05-02 LAB — BASIC METABOLIC PANEL
BUN: 28 mg/dL — AB (ref 6–23)
CO2: 19 mEq/L (ref 19–32)
Calcium: 8.1 mg/dL — ABNORMAL LOW (ref 8.4–10.5)
Chloride: 96 mEq/L (ref 96–112)
Creatinine, Ser: 1.31 mg/dL — ABNORMAL HIGH (ref 0.50–1.10)
GFR, EST AFRICAN AMERICAN: 50 mL/min — AB (ref >90)
GFR, EST NON AFRICAN AMERICAN: 43 mL/min — AB (ref >90)
GLUCOSE: 183 mg/dL — AB (ref 70–99)
Potassium: 4.1 mEq/L (ref 3.7–5.3)
SODIUM: 132 meq/L — AB (ref 137–147)

## 2013-05-02 LAB — LACTATE DEHYDROGENASE: LDH: 313 U/L — ABNORMAL HIGH (ref 94–250)

## 2013-05-02 LAB — LIPASE, BLOOD: Lipase: 82 U/L — ABNORMAL HIGH (ref 11–59)

## 2013-05-02 LAB — I-STAT CG4 LACTIC ACID, ED: Lactic Acid, Venous: 1.14 mmol/L (ref 0.5–2.2)

## 2013-05-02 MED ORDER — SODIUM CHLORIDE 0.9 % IV BOLUS (SEPSIS)
2000.0000 mL | Freq: Once | INTRAVENOUS | Status: AC
  Administered 2013-05-03: 2000 mL via INTRAVENOUS

## 2013-05-02 MED ORDER — SODIUM CHLORIDE 0.9 % IV BOLUS (SEPSIS)
2000.0000 mL | Freq: Once | INTRAVENOUS | Status: AC
  Administered 2013-05-02: 2000 mL via INTRAVENOUS

## 2013-05-02 MED ORDER — MORPHINE SULFATE 4 MG/ML IJ SOLN
4.0000 mg | Freq: Once | INTRAMUSCULAR | Status: AC
  Administered 2013-05-02: 4 mg via INTRAVENOUS
  Filled 2013-05-02: qty 1

## 2013-05-02 MED ORDER — ONDANSETRON HCL 4 MG/2ML IJ SOLN
4.0000 mg | Freq: Once | INTRAMUSCULAR | Status: AC
  Administered 2013-05-02: 4 mg via INTRAVENOUS
  Filled 2013-05-02: qty 2

## 2013-05-02 NOTE — ED Notes (Signed)
Notified EDP,Otter,MD., Pt. i-stat CG4 lactic acid 1.14.

## 2013-05-02 NOTE — ED Notes (Signed)
Pt states she went to her dr on Monday and was diagnosed with the flu and was given phenergan  Pt states she took one pill and broke out in a rash  Pt states she has not taken anymore but the rash continue  Pt states the rash itched initially then stopped Pt states it itched after her shower for about an hour  Pt states she still feels bad from the flu and states she is extremely thirsty

## 2013-05-02 NOTE — ED Provider Notes (Signed)
CSN: [id number]     Arrival date & time 05/02/13  2119 History   First MD Initiated Contact with Patient 05/02/13 2135     Chief Complaint  Patient presents with  . Allergic Reaction     (Consider location/radiation/quality/duration/timing/severity/associated sxs/prior Treatment) HPI  Tonya [name] is a 63 y.o. female complaining of fever, chills, Rigors, myalgia, runny nose diffuse pruritic rash worsening over the course of the last several days. Patient was treated for a left lower term a cellulitis with Bactrim. She had ultrasound to rule out DVT. Patient started vomiting and had myalgia which she attributed to allergic reaction to Bactrim, she was given Phenergan and a rash develops. Patient was seen by her primary care doctor who diagnosed her with influenza. Patient has had increasing pain, vomiting, fatigue over the course of the last 72 hours. She denies cough shortness of breath, chest pain, change in bowel or bladder habits.  Past Medical History  Diagnosis Date  . Coronary artery disease 07/2003  . Obesity 2003    s/p gastric bypass   . Hypertension   . Diabetes mellitus    Past Surgical History  Procedure Laterality Date  . Gastric bypass  2003  . Cholecystectomy  1999   Family History  Problem Relation Age of Onset  . Heart attack Mother   . Heart attack Father    History  Substance Use Topics  . Smoking status: Never Smoker   . Smokeless tobacco: Not on file  . Alcohol Use: No   OB History   Grav Para Term Preterm Abortions TAB SAB Ect Mult Living                 Review of Systems  10 systems reviewed and found to be negative, except as noted in the HPI.  Allergies  Phenergan and Penicillins  Home Medications   Prior to Admission medications   Medication Sig Start Date End Date Taking? Authorizing Provider  alendronate (FOSAMAX) 70 MG tablet TAKE 1 TABLET BY MOUTH ONCE WEEKLY 12/02/12   Lucille Passy, MD  aspirin 81 MG tablet Take 81 mg by  mouth daily.    Historical Provider, MD  cholecalciferol (VITAMIN D) 1000 UNITS tablet Take 1,000 Units by mouth daily.      Historical Provider, MD  ferrous sulfate (FERROUSUL) 325 (65 FE) MG tablet Take 1 tablet (325 mg total) by mouth daily with breakfast. 04/20/13   Lucille Passy, MD  phentermine 15 MG capsule 1 tab po daily 04/30/13   Lucille Passy, MD  promethazine (PHENERGAN) 12.5 MG tablet 1-2 tablets every 8 hours as needed for nausea 04/30/13   Lucille Passy, MD  sulfamethoxazole-trimethoprim (BACTRIM DS) 800-160 MG per tablet 1 tablet twice daily x 7- 10 days 04/25/13   Lucille Passy, MD  Vitamin D, Ergocalciferol, (DRISDOL) 50000 UNITS CAPS capsule Take 1 capsule (50,000 Units total) by mouth every 7 (seven) days. 04/20/13   Lucille Passy, MD   BP 93/55  Pulse 121  Temp(Src) 100.3 F (37.9 C) (Oral)  Resp 16  SpO2 97% Physical Exam  Nursing note and vitals reviewed. Constitutional: She is oriented to person, place, and time. She appears well-developed and well-nourished. No distress.  HENT:  Head: Normocephalic.  Mouth/Throat: Oropharynx is clear and moist.   Very dry mucous membranes  Eyes: Conjunctivae and EOM are normal. Pupils are equal, round, and reactive to light.  Neck: Normal range of motion.  Cardiovascular: Regular rhythm and intact  distal pulses.   Moderate tachycardia  Pulmonary/Chest: Effort normal and breath sounds normal. No stridor. No respiratory distress. She has no wheezes. She has no rales. She exhibits no tenderness.  Abdominal: Soft. Bowel sounds are normal.  Musculoskeletal: Normal range of motion.  Mild erythema, warmth, tenderness to palpation, swelling to the right lower extremity at the lateral ankle excluding the feet and toes  Neurological: She is alert and oriented to person, place, and time.  Skin: Skin is warm. Rash noted.  Urticarial rash to the torso. Rash spares the mucous membranes  Psychiatric: She has a normal mood and affect.    ED Course   Procedures (including critical care time) Labs Review Labs Reviewed  CBC WITH DIFFERENTIAL - Abnormal; Notable for the following:    WBC 18.0 (*)    Hemoglobin 11.8 (*)    HCT 35.9 (*)    Neutrophils Relative % 96 (*)    Neutro Abs 17.2 (*)    Lymphocytes Relative 1 (*)    Lymphs Abs 0.2 (*)    All other components within normal limits  BASIC METABOLIC PANEL - Abnormal; Notable for the following:    Sodium 132 (*)    Glucose, Bld 183 (*)    BUN 28 (*)    Creatinine, Ser 1.31 (*)    Calcium 8.1 (*)    GFR calc non Af Amer 43 (*)    GFR calc Af Amer 50 (*)    All other components within normal limits  LACTATE DEHYDROGENASE - Abnormal; Notable for the following:    LDH 313 (*)    All other components within normal limits  LIPASE, BLOOD - Abnormal; Notable for the following:    Lipase 82 (*)    All other components within normal limits  HEPATIC FUNCTION PANEL - Abnormal; Notable for the following:    Albumin 2.7 (*)    AST 44 (*)    ALT 89 (*)    Alkaline Phosphatase 122 (*)    All other components within normal limits  CULTURE, BLOOD (ROUTINE X 2)  CULTURE, BLOOD (ROUTINE X 2)  LACTIC ACID, PLASMA  URINALYSIS, ROUTINE W REFLEX MICROSCOPIC  I-STAT CG4 LACTIC ACID, ED    Imaging Review No results found.   EKG Interpretation None      MDM   Final diagnoses:  AKI (acute kidney injury)  SIRS (systemic inflammatory response syndrome)  Cellulitis of right lower extremity  Pruritic rash    Filed Vitals:   05/02/13 2123 05/03/13 0019  BP: 93/55 94/42  Pulse: 121 96  Temp: 100.3 F (37.9 C) 99.8 F (37.7 C)  TempSrc: Oral Oral  Resp: 16 22  SpO2: 97% 94%    Medications  clindamycin (CLEOCIN) IVPB 600 mg (not administered)  sodium chloride 0.9 % bolus 2,000 mL (not administered)  sodium chloride 0.9 % bolus 2,000 mL (0 mLs Intravenous Stopped 05/03/13 0020)  ondansetron (ZOFRAN) injection 4 mg (4 mg Intravenous Given 05/02/13 2228)  morphine 4 MG/ML  injection 4 mg (4 mg Intravenous Given 05/02/13 2228)  sodium chloride 0.9 % bolus 2,000 mL (2,000 mLs Intravenous New Bag/Given 05/03/13 0034)    Tonya [name] is a 63 y.o. female presenting with fever, tachycardia, myalgia, nausea and vomiting. Patient was recently diagnosed with a right lower extremity cellulitis, started on Bactrim and developed vomiting and a pruritic rash. Patient's PCP felt that she was not allergic to Bactrim but rather the Phenergan that she was given 4 vomiting. Patient was diagnosed  with influenza.   Patient meets sirs criteria, question septicemia from cellulitis versus influenza-like illness versus drug reaction. Initiated sepsis sepsis workup. Rash spares the mucous membranes, doubt SJS.  This is a shared visit with the attending physician who personally evaluated the patient and agrees with the care plan.   The patient will be admitted to Dr. Arnoldo Morale.   Note: Portions of this report may have been transcribed using voice recognition software. Every effort was made to ensure accuracy; however, inadvertent computerized transcription errors may be present     Monico Blitz, PA-C 05/03/13 [phone]

## 2013-05-02 NOTE — ED Notes (Signed)
Pt was given bottled water by family member--- tolerating well; pt denies nausea at this time.

## 2013-05-03 ENCOUNTER — Ambulatory Visit: Payer: Medicare Other | Admitting: Family Medicine

## 2013-05-03 DIAGNOSIS — L03119 Cellulitis of unspecified part of limb: Secondary | ICD-10-CM | POA: Diagnosis present

## 2013-05-03 DIAGNOSIS — D72829 Elevated white blood cell count, unspecified: Secondary | ICD-10-CM

## 2013-05-03 DIAGNOSIS — T50995A Adverse effect of other drugs, medicaments and biological substances, initial encounter: Secondary | ICD-10-CM

## 2013-05-03 DIAGNOSIS — I959 Hypotension, unspecified: Secondary | ICD-10-CM | POA: Diagnosis present

## 2013-05-03 DIAGNOSIS — I10 Essential (primary) hypertension: Secondary | ICD-10-CM

## 2013-05-03 DIAGNOSIS — A419 Sepsis, unspecified organism: Secondary | ICD-10-CM

## 2013-05-03 DIAGNOSIS — L282 Other prurigo: Secondary | ICD-10-CM

## 2013-05-03 DIAGNOSIS — E8809 Other disorders of plasma-protein metabolism, not elsewhere classified: Secondary | ICD-10-CM

## 2013-05-03 DIAGNOSIS — L02419 Cutaneous abscess of limb, unspecified: Secondary | ICD-10-CM

## 2013-05-03 DIAGNOSIS — N179 Acute kidney failure, unspecified: Secondary | ICD-10-CM | POA: Diagnosis present

## 2013-05-03 DIAGNOSIS — R6520 Severe sepsis without septic shock: Secondary | ICD-10-CM

## 2013-05-03 DIAGNOSIS — T7840XA Allergy, unspecified, initial encounter: Secondary | ICD-10-CM | POA: Diagnosis present

## 2013-05-03 DIAGNOSIS — R6510 Systemic inflammatory response syndrome (SIRS) of non-infectious origin without acute organ dysfunction: Secondary | ICD-10-CM

## 2013-05-03 LAB — CBC
HCT: 27 % — ABNORMAL LOW (ref 36.0–46.0)
Hemoglobin: 8.8 g/dL — ABNORMAL LOW (ref 12.0–15.0)
MCH: 28.9 pg (ref 26.0–34.0)
MCHC: 32.6 g/dL (ref 30.0–36.0)
MCV: 88.8 fL (ref 78.0–100.0)
Platelets: 139 10*3/uL — ABNORMAL LOW (ref 150–400)
RBC: 3.04 MIL/uL — ABNORMAL LOW (ref 3.87–5.11)
RDW: 13.6 % (ref 11.5–15.5)
WBC: 12.3 10*3/uL — ABNORMAL HIGH (ref 4.0–10.5)

## 2013-05-03 LAB — HEMOGLOBIN A1C
Hgb A1c MFr Bld: 5.1 % (ref <5.7)
Mean Plasma Glucose: 100 mg/dL (ref <117)

## 2013-05-03 LAB — URINALYSIS, ROUTINE W REFLEX MICROSCOPIC
Glucose, UA: NEGATIVE mg/dL
Hgb urine dipstick: NEGATIVE
KETONES UR: 15 mg/dL — AB
Nitrite: NEGATIVE
PROTEIN: 30 mg/dL — AB
Specific Gravity, Urine: 1.02 (ref 1.005–1.030)
UROBILINOGEN UA: 1 mg/dL (ref 0.0–1.0)
pH: 5.5 (ref 5.0–8.0)

## 2013-05-03 LAB — GLUCOSE, CAPILLARY
GLUCOSE-CAPILLARY: 143 mg/dL — AB (ref 70–99)
GLUCOSE-CAPILLARY: 147 mg/dL — AB (ref 70–99)
Glucose-Capillary: 162 mg/dL — ABNORMAL HIGH (ref 70–99)
Glucose-Capillary: 168 mg/dL — ABNORMAL HIGH (ref 70–99)

## 2013-05-03 LAB — URINE MICROSCOPIC-ADD ON

## 2013-05-03 LAB — BASIC METABOLIC PANEL
BUN: 24 mg/dL — ABNORMAL HIGH (ref 6–23)
CALCIUM: 6.1 mg/dL — AB (ref 8.4–10.5)
CO2: 16 meq/L — AB (ref 19–32)
CREATININE: 0.88 mg/dL (ref 0.50–1.10)
Chloride: 110 mEq/L (ref 96–112)
GFR calc Af Amer: 81 mL/min — ABNORMAL LOW (ref >90)
GFR calc non Af Amer: 70 mL/min — ABNORMAL LOW (ref >90)
Glucose, Bld: 128 mg/dL — ABNORMAL HIGH (ref 70–99)
Potassium: 4.1 mEq/L (ref 3.7–5.3)
Sodium: 138 mEq/L (ref 137–147)

## 2013-05-03 LAB — CORTISOL: CORTISOL PLASMA: 36.5 ug/dL

## 2013-05-03 LAB — LACTIC ACID, PLASMA
Lactic Acid, Venous: 1 mmol/L (ref 0.5–2.2)
Lactic Acid, Venous: 1.3 mmol/L (ref 0.5–2.2)

## 2013-05-03 LAB — MRSA PCR SCREENING: MRSA BY PCR: NEGATIVE

## 2013-05-03 LAB — PROCALCITONIN: PROCALCITONIN: 3.84 ng/mL

## 2013-05-03 MED ORDER — FAMOTIDINE IN NACL 20-0.9 MG/50ML-% IV SOLN
20.0000 mg | Freq: Two times a day (BID) | INTRAVENOUS | Status: DC
  Administered 2013-05-03 – 2013-05-04 (×5): 20 mg via INTRAVENOUS
  Filled 2013-05-03 (×7): qty 50

## 2013-05-03 MED ORDER — ONDANSETRON HCL 4 MG/2ML IJ SOLN: 4.0000 mg | Freq: Four times a day (QID) | INTRAMUSCULAR | Status: DC | PRN

## 2013-05-03 MED ORDER — ONDANSETRON HCL 4 MG PO TABS: 4.0000 mg | ORAL_TABLET | Freq: Four times a day (QID) | ORAL | Status: DC | PRN

## 2013-05-03 MED ORDER — ALUM & MAG HYDROXIDE-SIMETH 200-200-20 MG/5ML PO SUSP: 30.0000 mL | Freq: Four times a day (QID) | ORAL | Status: DC | PRN

## 2013-05-03 MED ORDER — SODIUM CHLORIDE 0.9 % IV SOLN
INTRAVENOUS | Status: DC
Start: 2013-05-03 — End: 2013-05-05
  Administered 2013-05-03 – 2013-05-04 (×4): via INTRAVENOUS

## 2013-05-03 MED ORDER — OXYCODONE HCL 5 MG PO TABS: 5.0000 mg | ORAL_TABLET | ORAL | Status: DC | PRN

## 2013-05-03 MED ORDER — CLINDAMYCIN PHOSPHATE 600 MG/50ML IV SOLN
600.0000 mg | Freq: Once | INTRAVENOUS | Status: AC
  Administered 2013-05-03: 600 mg via INTRAVENOUS
  Filled 2013-05-03: qty 50

## 2013-05-03 MED ORDER — HYDROMORPHONE HCL PF 1 MG/ML IJ SOLN: 0.5000 mg | INTRAMUSCULAR | Status: DC | PRN

## 2013-05-03 MED ORDER — INSULIN ASPART 100 UNIT/ML ~~LOC~~ SOLN
0.0000 [IU] | Freq: Three times a day (TID) | SUBCUTANEOUS | Status: DC
  Administered 2013-05-03: 1 [IU] via SUBCUTANEOUS
  Administered 2013-05-03: 2 [IU] via SUBCUTANEOUS
  Administered 2013-05-03: 1 [IU] via SUBCUTANEOUS

## 2013-05-03 MED ORDER — DIPHENHYDRAMINE HCL 50 MG/ML IJ SOLN
12.5000 mg | Freq: Four times a day (QID) | INTRAMUSCULAR | Status: AC
  Administered 2013-05-03 (×4): 12.5 mg via INTRAVENOUS
  Filled 2013-05-03 (×4): qty 1

## 2013-05-03 MED ORDER — METHYLPREDNISOLONE SODIUM SUCC 125 MG IJ SOLR
125.0000 mg | Freq: Once | INTRAMUSCULAR | Status: AC
  Administered 2013-05-03: 125 mg via INTRAVENOUS
  Filled 2013-05-03: qty 2

## 2013-05-03 MED ORDER — ASPIRIN EC 81 MG PO TBEC
81.0000 mg | DELAYED_RELEASE_TABLET | Freq: Every day | ORAL | Status: DC
  Administered 2013-05-03 – 2013-05-05 (×3): 81 mg via ORAL
  Filled 2013-05-03 (×3): qty 1

## 2013-05-03 MED ORDER — CLINDAMYCIN PHOSPHATE 600 MG/50ML IV SOLN
600.0000 mg | Freq: Three times a day (TID) | INTRAVENOUS | Status: DC
  Administered 2013-05-03 – 2013-05-04 (×4): 600 mg via INTRAVENOUS
  Filled 2013-05-03 (×5): qty 50

## 2013-05-03 MED ORDER — ACETAMINOPHEN 650 MG RE SUPP: 650.0000 mg | Freq: Four times a day (QID) | RECTAL | Status: DC | PRN

## 2013-05-03 MED ORDER — ACETAMINOPHEN 325 MG PO TABS: 650.0000 mg | ORAL_TABLET | Freq: Four times a day (QID) | ORAL | Status: DC | PRN

## 2013-05-03 MED ORDER — ENOXAPARIN SODIUM 40 MG/0.4ML ~~LOC~~ SOLN
40.0000 mg | SUBCUTANEOUS | Status: DC
  Administered 2013-05-03 – 2013-05-05 (×3): 40 mg via SUBCUTANEOUS
  Filled 2013-05-03 (×3): qty 0.4

## 2013-05-03 MED ORDER — SODIUM CHLORIDE 0.9 % IV BOLUS (SEPSIS)
1000.0000 mL | Freq: Once | INTRAVENOUS | Status: AC
  Administered 2013-05-03: 1000 mL via INTRAVENOUS

## 2013-05-03 MED ORDER — INSULIN ASPART 100 UNIT/ML ~~LOC~~ SOLN: 0.0000 [IU] | Freq: Every day | SUBCUTANEOUS | Status: DC

## 2013-05-03 MED ORDER — SODIUM CHLORIDE 0.9 % IV BOLUS (SEPSIS)
2000.0000 mL | Freq: Once | INTRAVENOUS | Status: AC
  Administered 2013-05-03: 2000 mL via INTRAVENOUS

## 2013-05-03 NOTE — Consult Note (Signed)
PULMONARY / CRITICAL CARE MEDICINE   Name: Tonya [name] MRN: [id number] DOB: Oct 19, 1950    ADMISSION DATE:  05/02/2013 CONSULTATION DATE:  4/30  REFERRING MD :  [facility] PRIMARY SERVICE: [facility]  CHIEF COMPLAINT:  AMS  BRIEF PATIENT DESCRIPTION:  Rt lower ext with rash  SIGNIFICANT EVENTS / STUDIES:  4/29 hypotension  LINES / TUBES:   CULTURES: 4/29 bc>>   ANTIBIOTICS: 4/30 clinda>>  HISTORY OF PRESENT ILLNESS:   Rt lower ext with rash  PAST MEDICAL HISTORY :  Past Medical History  Diagnosis Date  . Coronary artery disease 07/2003  . Obesity 2003    s/p gastric bypass   . Hypertension   . Diabetes mellitus    Past Surgical History  Procedure Laterality Date  . Gastric bypass  2003  . Cholecystectomy  1999   Prior to Admission medications   Medication Sig Start Date End Date Taking? Authorizing Provider  alendronate (FOSAMAX) 70 MG tablet TAKE 1 TABLET BY MOUTH ONCE WEEKLY SUNDAY 12/02/12  Yes Talia M Aron, MD  aspirin 81 MG tablet Take 81 mg by mouth daily.   Yes Historical Provider, MD  cholecalciferol (VITAMIN D) 1000 UNITS tablet Take 1,000 Units by mouth daily.     Yes Historical Provider, MD  ferrous sulfate (FERROUSUL) 325 (65 FE) MG tablet Take 1 tablet (325 mg total) by mouth daily with breakfast. 04/20/13  Yes Talia M Aron, MD  phentermine 15 MG capsule Take 15 mg by mouth every morning. 1 tab po daily 04/30/13  Yes Talia M Aron, MD   Allergies  Allergen Reactions  . Bactrim [Sulfamethoxazole-Tmp Ds] Hives  . Phenergan [Promethazine Hcl]     Hives  . Penicillins     REACTION: As a child.    FAMILY HISTORY:  Family History  Problem Relation Age of Onset  . Heart attack Mother   . Heart attack Father    SOCIAL HISTORY:  reports that she has never smoked. She does not have any smokeless tobacco history on file. She reports that she does not drink alcohol or use illicit drugs.  REVIEW OF SYSTEMS: 10 point review of system taken, please see  HPI for positives and negatives.   SUBJECTIVE:   VITAL SIGNS: Temp:  [97.9 F (36.6 C)-100.3 F (37.9 C)] 99 F (37.2 C) (04/30 0400) Pulse Rate:  [72-121] 72 (04/30 0730) Resp:  [12-22] 18 (04/30 0730) BP: (70-94)/(40-55) 82/48 mmHg (04/30 0730) SpO2:  [94 %-99 %] 96 % (04/30 0730) Weight:  [80.6 kg (177 lb 11.1 oz)] 80.6 kg (177 lb 11.1 oz) (04/30 0300) HEMODYNAMICS:   VENTILATOR SETTINGS:   INTAKE / OUTPUT: Intake/Output     04 /29 0701 - 04/30 0700 04/30 0701 - 05/01 0700   I.V. (mL/kg) 40 (0.5)    IV Piggyback 5100    Total Intake(mL/kg) 5140 (63.8)    Urine (mL/kg/hr) 250    Total Output 250     Net +4890            PHYSICAL EXAMINATION: General:  Awake and alert NAD at rest Neuro:  Intact HEENT:  No LAN/JVD/orppharnyx dry Cardiovascular:  HSR RRR 72 Lungs:  CTA Abdomen: Sft NT Musculoskeletal:   Skin:  Warm, Rt lower ext with rash  LABS:  CBC  Recent Labs Lab 05/02/13 2220 05/03/13 0453  WBC 18.0* 12.3*  HGB 11.8* 8.8*  HCT 35.9* 27.0*  PLT 163 139*   Coag's No results found for this basename: APTT, INR,  in the last  168 hours BMET  Recent Labs Lab 05/02/13 2220 05/03/13 0453  NA 132* 138  K 4.1 4.1  CL 96 110  CO2 19 16*  BUN 28* 24*  CREATININE 1.31* 0.88  GLUCOSE 183* 128*   Electrolytes  Recent Labs Lab 05/02/13 2220 05/03/13 0453  CALCIUM 8.1* 6.1*   Sepsis Markers  Recent Labs Lab 05/02/13 2347 05/02/13 2356  LATICACIDVEN 1.3 1.14   ABG No results found for this basename: PHART, PCO2ART, PO2ART,  in the last 168 hours Liver Enzymes  Recent Labs Lab 05/02/13 2220  AST 44*  ALT 89*  ALKPHOS 122*  BILITOT 0.7  ALBUMIN 2.7*   Cardiac Enzymes No results found for this basename: TROPONINI, PROBNP,  in the last 168 hours Glucose  Recent Labs Lab 05/03/13 0817  GLUCAP 143*    Imaging No results found.   CXR: none  ASSESSMENT / PLAN:  PULMONARY A: No acute issue P:     CARDIOVASCULAR A: Shock  from presumed combination of N/V and infection P:  IVF as tolerated Follow lactic acid Abx for infection Check cortisol level but she is on steroids for rash.  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 Subsequently developed and infection and was treated per her PCP with Bactrim. She further developed nausea and was treated with phenergan. Then developed a rash that was attributed to the phenergan. During the night of 4/29 became lethargic and was taken to ED at [facility]. Found to be hypotensive and started on fluid resuscitation and despite 7 litres remained hypotensive. PCCM asked to evaluated. She was awake and alert, making urine and sbp was 88 but was being measured on the wrist. I suspect she has turned the corner with current interventions and will not need CVL/Pressor support.       Richardson Landry Minor ACNP Maryanna Shape PCCM Pager [phone] till 3 pm If no answer page [phone] 05/03/2013, 8:37 AM   PCCM ATTENDING: I have interviewed and examined the patient and reviewed the database. I have formulated the assessment and plan as reflected in the note above with amendments made by me.   Asymptomatic hypotension without evidence of end organ effect. Presumed sepsis related hypotension Her cortisol level was apparently drawn after receiving methylprednisolone. Will recheck in AM 5/01 PCT is modestly elevated Cont to monitor in ICU/SDU No indication for vasopressors presently LE findings of cellulitis are unimpressive She appears to be improving (symptomatically much improved) so will continue  current abx (Clinda)  Merton Border, MD;  PCCM service; Mobile [phone] Pulmonary and [place] Pager: [phone]  05/03/2013, 8:37 AM

## 2013-05-03 NOTE — H&P (Signed)
[facility] History and Physical  Darionna Banke TOI:[id number] DOB: [birth year] DOA: 05/02/2013  Referring physician:  PCP: Arnette Norris, MD  Specialists:   Chief Complaint:  Rash, Fevers Chills and  Muscle Aches     HPI: Tonya [name] is a 63 y.o. female had worsening redness of her right lower leg  1 week ago and was seen by her PCP and diagnosed with cellulitis and placed on antibiotic rx of Bactrim and sent for a Venous Ultrasound to rule out a DVT.  The ultrasound was negative fior a DVT.   She began to develop fevers and chills and myalgias as well as nausea and vomiting and and she was seen again by her PCP and prescried Phenergan for her symptoms and her PCP had her continue the Bactrim  Therapy for her Cellulitis.  She developed a rash all over  The next day and her PCP felt that the rash was most likely due to the Phenergan, and the phenergan was discontinued and she continue to take the Bactrim.     She presented to the ED this evening due to continued rash and fevers and chills and myalgias.       Review of Systems:  Constitutional: No Weight Loss, No Weight Gain, Night Sweats, +Fevers, +Chills, Fatigue, or +Generalized Weakness HEENT: No Headaches, Difficulty Swallowing,Tooth/Dental Problems,Sore Throat,  No Sneezing, Rhinitis, Ear Ache, Nasal Congestion, or Post Nasal Drip,  Cardio-vascular:  No Chest pain, Orthopnea, PND, Edema in lower extremities, Anasarca, Dizziness, Palpitations  Resp: No Dyspnea, No DOE, No Productive Cough, No Non-Productive Cough, No Hemoptysis, No Change in Color of Mucus,  No Wheezing.    GI: No Heartburn, Indigestion, Abdominal Pain, Nausea, Vomiting, Diarrhea, Change in Bowel Habits,  Loss of Appetite  GU: No Dysuria, Change in Color of Urine, No Urgency or Frequency.  No flank pain.  Musculoskeletal: +Myalgias, No Joint Pain or Swelling.  No Decreased Range of Motion. No Back Pain.  Neurologic: No Syncope, No Seizures, Muscle Weakness,  Paresthesia, Vision Disturbance or Loss, No Diplopia, No Vertigo, No Difficulty Walking,  Skin:  +Rash , No Lesions. Psych: No Change in Mood or Affect. No Depression or Anxiety. No Memory loss. No Confusion or Hallucinations   Past Medical History  Diagnosis Date  . Coronary artery disease 07/2003  . Obesity 2003    s/p gastric bypass   . Hypertension   . Diabetes mellitus       Past Surgical History  Procedure Laterality Date  . Gastric bypass  2003  . Cholecystectomy  1999       Prior to Admission medications   Medication Sig Start Date End Date Taking? Authorizing Provider  alendronate (FOSAMAX) 70 MG tablet TAKE 1 TABLET BY MOUTH ONCE WEEKLY SUNDAY 12/02/12  Yes Lucille Passy, MD  aspirin 81 MG tablet Take 81 mg by mouth daily.   Yes Historical Provider, MD  cholecalciferol (VITAMIN D) 1000 UNITS tablet Take 1,000 Units by mouth daily.     Yes Historical Provider, MD  ferrous sulfate (FERROUSUL) 325 (65 FE) MG tablet Take 1 tablet (325 mg total) by mouth daily with breakfast. 04/20/13  Yes Lucille Passy, MD  phentermine 15 MG capsule Take 15 mg by mouth every morning. 1 tab po daily 04/30/13  Yes Lucille Passy, MD      Allergies  Allergen Reactions  . Bactrim [Sulfamethoxazole-Tmp Ds] Hives  . Phenergan [Promethazine Hcl]     Hives  . Penicillins  REACTION: As a child.     Social History:  reports that she has never smoked. She does not have any smokeless tobacco history on file. She reports that she does not drink alcohol or use illicit drugs.      Family History  Problem Relation Age of Onset  . Heart attack Mother   . Heart attack Father        Physical Exam:  GEN:  Pleasant Obese Elderly 63 y.o. Caucasian female examined  and in no acute distress; cooperative with exam Filed Vitals:   05/03/13 0038 05/03/13 0047 05/03/13 0130 05/03/13 0200  BP: 91/42 89/47 93/53  92/54  Pulse: 91 92 95 92  Temp:   99.5 F (37.5 C)   TempSrc:   Oral   Resp:   21  15  SpO2: 95% 96% 98% 96%   Blood pressure 92/54, pulse 92, temperature 99.5 F (37.5 C), temperature source Oral, resp. rate 15, SpO2 96.00%. PSYCH: She is alert and oriented x4; does not appear anxious does not appear depressed; affect is normal HEENT: Normocephalic and Atraumatic, Mucous membranes pink; PERRLA; EOM intact; Fundi:  Benign;  No scleral icterus, Nares: Patent, Oropharynx: Clear, Fair Dentition, Neck:  FROM, no cervical lymphadenopathy nor thyromegaly or carotid bruit; no JVD; Breasts:: Not examined CHEST WALL: No tenderness CHEST: Normal respiration, clear to auscultation bilaterally HEART: Regular rate and rhythm; no murmurs rubs or gallops BACK: No kyphosis or scoliosis; no CVA tenderness ABDOMEN: Positive Bowel Sounds, Obese, soft non-tender; no masses, no organomegaly. Rectal Exam: Not done EXTREMITIES: No cyanosis, clubbing or edema; no ulcerations. Genitalia: not examined PULSES: 2+ and symmetric SKIN: Normal hydration no rash or ulceration CNS:  Alert and Oriented X 4,  No focal Deficits.   Vascular: pulses palpable throughout    Labs on Admission:  Basic Metabolic Panel:  Recent Labs Lab 05/02/13 2220  NA 132*  K 4.1  CL 96  CO2 19  GLUCOSE 183*  BUN 28*  CREATININE 1.31*  CALCIUM 8.1*   Liver Function Tests:  Recent Labs Lab 05/02/13 2220  AST 44*  ALT 89*  ALKPHOS 122*  BILITOT 0.7  PROT 6.1  ALBUMIN 2.7*    Recent Labs Lab 05/02/13 2220  LIPASE 82*   No results found for this basename: AMMONIA,  in the last 168 hours CBC:  Recent Labs Lab 05/02/13 2220  WBC 18.0*  NEUTROABS 17.2*  HGB 11.8*  HCT 35.9*  MCV 88.2  PLT 163   Cardiac Enzymes: No results found for this basename: CKTOTAL, CKMB, CKMBINDEX, TROPONINI,  in the last 168 hours  BNP (last 3 results) No results found for this basename: PROBNP,  in the last 8760 hours CBG: No results found for this basename: GLUCAP,  in the last 168 hours  Radiological Exams on  Admission: No results found.    EKG: Independently reviewed.     Assessment/Plan:   62 y.o. female with  Principal Problem:   SIRS (systemic inflammatory response syndrome) Active Problems:   Hypotension   Allergic reaction caused by a drug   Cellulitis and abscess of leg   AKI (acute kidney injury)   VITAMIN D DEFICIENCY   DIABETES MELLITUS, HX OF   Hypoalbuminemia   Leukocytosis     1.   SIRS-  Blood Cultures sent, and placed on IV Clindaymcin and IVFs.   And admitted to SDU Bed.    2.   Hypotension- due to #1.  Check cortisol level.   3.  Allergic Rxn/Drug Eruption-  Most Likely due to Bactrim Rx.  Bactrim Discontinued, and placed on IV Benadryl q 6 hrs x 24 hrs, and , IV Pepcid  BID x 3 days, and Solumedrol 125 mg IV x 1.    4.   Cellulitis of Right Leg-  IV Clindamycin  600 mg  q 8 hrs.    5.   AKI- IVFs for rehydration, and Monitor BUN/Cr.    6.   DM2 hx-  Has mild hyperglycemia- SSI ordered and check HbA1c.    Reports that she had DM2 but it resolved after weight loss from her Gastric Bypass.    7.   Hypoalbuminemia-  Albumin = 2.7  From Malnutrition : hx of Gastric Bypass.    8.   Leukocytosis- IV clindamycin, blood cultures sent, and mya need to adjust Abx, monitor trend.     9.  DVT prophylaxis with Lovenox.       Code Status:  FULL CODE Family Communication:   Daughter at Bedside Disposition Plan:   Inpatient      Time spent:  [facility] Pager [phone]  If 7PM-7AM, please contact night-coverage [email or web address] Password [email or web address] 05/03/2013, 2:48 AM

## 2013-05-03 NOTE — Progress Notes (Signed)
eLink Physician-Brief Progress Note Patient Name: Tonya [name] DOB: 08/23/1950 MRN: [id number]  Date of Service  05/03/2013   HPI/Events of Note   Call received from [facility] cross cover Cellulitis, soft BP, SIRS Called RE BP low despite BP  Despite low BP looks well on camera check resting comfortably, O2 saturation 100% RA  eICU Interventions  Check cortisol Repeat lactic acid for reassurance Bedside staff to evaluate this morning   Intervention Category Major Interventions: Hypotension - evaluation and management  Juanito Doom 05/03/2013, 5:42 AM

## 2013-05-03 NOTE — Progress Notes (Signed)
[id number]/Rhonda Davis, RN, BSN, CCM  [phone]  Chart Reviewed for discharge and hospital needs.  Discharge needs at time of review: None present will follow for needs.  Review of patient progress due on [id number]. 

## 2013-05-03 NOTE — ED Provider Notes (Signed)
Medical screening examination/treatment/procedure(s) were performed by non-physician practitioner and as supervising physician I was immediately available for consultation/collaboration.  Jianni Batten T Jerime Arif, MD 05/03/13 2319 

## 2013-05-03 NOTE — Progress Notes (Signed)
Patient seen and evaluated earlier this AM by my associate. Please refer to the H and P for details regarding assessment and plan.  I will reassess next AM.  PCCM on board for recommendations regarding low/soft blood pressures. Patient up in bed non toxic, smiling, stating that she feels much better.  Velvet Bathe MD

## 2013-05-04 LAB — GLUCOSE, CAPILLARY
GLUCOSE-CAPILLARY: 96 mg/dL (ref 70–99)
GLUCOSE-CAPILLARY: 85 mg/dL (ref 70–99)
Glucose-Capillary: 104 mg/dL — ABNORMAL HIGH (ref 70–99)
Glucose-Capillary: 90 mg/dL (ref 70–99)

## 2013-05-04 LAB — CORTISOL: CORTISOL PLASMA: 5.2 ug/dL

## 2013-05-04 LAB — PROCALCITONIN: PROCALCITONIN: 2.36 ng/mL

## 2013-05-04 MED ORDER — LEVOFLOXACIN 500 MG PO TABS
500.0000 mg | ORAL_TABLET | Freq: Every day | ORAL | Status: DC
  Administered 2013-05-04 – 2013-05-05 (×2): 500 mg via ORAL
  Filled 2013-05-04 (×2): qty 1

## 2013-05-04 NOTE — Progress Notes (Signed)
[facility] PROGRESS NOTE  Tonya [name] OFB:[id number] DOB: 08-30-1950 DOA: 05/02/2013 PCP: Arnette Norris, MD  Assessment/Plan:  Principal Problem:   Severe sepsis(995.92)/hypotension - Etiology of hypotension either from infectious vs anaphylactoid reaction to bactrim. - improved and hypotension resolved. - Transition to floor and obtain PT evaluation - Will continue levaquin  Active Problems:   DIABETES MELLITUS, HX OF - Continue carb modified diet -  SSI    Allergic reaction caused by a drug - Avoiding sulfa drugs - providing supportive therapy    Cellulitis and abscess of leg - Levaquin on board and most likely contributed to principle problem.    AKI (acute kidney injury) - resolved and most likely related to hypotension/ prerenal causes    Leukocytosis - resolving on current antibiotic regimen.   Code Status: full Family Communication: No family at bedside Disposition Plan: To floor with physical therapy evaluation   Consultants:  PCCM  PT  Procedures:  none  Antibiotics:  Levaquin  HPI/Subjective: Pt states that she feels better but feels weak.  Objective: Filed Vitals:   05/04/13 1000  BP:   Pulse: 80  Temp:   Resp: 13    Intake/Output Summary (Last 24 hours) at 05/04/13 1056 Last data filed at 05/04/13 1000  Gross per 24 hour  Intake   2890 ml  Output    975 ml  Net   1915 ml   Filed Weights   05/03/13 0300  Weight: 80.6 kg (177 lb 11.1 oz)    Exam:   General:  Pt in NAD, alert and awake  Cardiovascular: RRR, no MRG  Respiratory: CTA BL, no wheezes  Abdomen: soft, NT, ND  Musculoskeletal: no cyanosis or clubbing   Data Reviewed: Basic Metabolic Panel:  Recent Labs Lab 05/02/13 2220 05/03/13 0453  NA 132* 138  K 4.1 4.1  CL 96 110  CO2 19 16*  GLUCOSE 183* 128*  BUN 28* 24*  CREATININE 1.31* 0.88  CALCIUM 8.1* 6.1*   Liver Function Tests:  Recent Labs Lab 05/02/13 2220  AST 44*  ALT 89*   ALKPHOS 122*  BILITOT 0.7  PROT 6.1  ALBUMIN 2.7*    Recent Labs Lab 05/02/13 2220  LIPASE 82*   No results found for this basename: AMMONIA,  in the last 168 hours CBC:  Recent Labs Lab 05/02/13 2220 05/03/13 0453  WBC 18.0* 12.3*  NEUTROABS 17.2*  --   HGB 11.8* 8.8*  HCT 35.9* 27.0*  MCV 88.2 88.8  PLT 163 139*   Cardiac Enzymes: No results found for this basename: CKTOTAL, CKMB, CKMBINDEX, TROPONINI,  in the last 168 hours BNP (last 3 results) No results found for this basename: PROBNP,  in the last 8760 hours CBG:  Recent Labs Lab 05/03/13 0817 05/03/13 1315 05/03/13 1612 05/03/13 2027 05/04/13 0732  GLUCAP 143* 147* 162* 168* 96    Recent Results (from the past 240 hour(s))  CULTURE, BLOOD (ROUTINE X 2)     Status: None   Collection Time    05/02/13 10:20 PM      Result Value Ref Range Status   Specimen Description BLOOD RIGHT ANTECUBITAL   Final   Special Requests BOTTLES DRAWN AEROBIC AND ANAEROBIC 5ML EACH   Final   Culture  Setup Time     Final   Value: 05/03/2013 01:59     Performed at [facility]   Culture     Final   Value:        BLOOD  CULTURE RECEIVED NO GROWTH TO DATE CULTURE WILL BE HELD FOR 5 DAYS BEFORE ISSUING A FINAL NEGATIVE REPORT     Performed at [facility]   Report Status PENDING   Incomplete  CULTURE, BLOOD (ROUTINE X 2)     Status: None   Collection Time    05/02/13 10:30 PM      Result Value Ref Range Status   Specimen Description BLOOD BLOOD LEFT FOREARM   Final   Special Requests BOTTLES DRAWN AEROBIC AND ANAEROBIC 1CC   Final   Culture  Setup Time     Final   Value: 05/03/2013 01:59     Performed at [facility]   Culture     Final   Value:        BLOOD CULTURE RECEIVED NO GROWTH TO DATE CULTURE WILL BE HELD FOR 5 DAYS BEFORE ISSUING A FINAL NEGATIVE REPORT     Performed at [facility]   Report Status PENDING   Incomplete  MRSA PCR SCREENING     Status: None   Collection Time     05/03/13  3:01 AM      Result Value Ref Range Status   MRSA by PCR NEGATIVE  NEGATIVE Final   Comment:            The GeneXpert MRSA Assay (FDA     approved for NASAL specimens     only), is one component of a     comprehensive MRSA colonization     surveillance program. It is not     intended to diagnose MRSA     infection nor to guide or     monitor treatment for     MRSA infections.     Studies: No results found.  Scheduled Meds: . aspirin EC  81 mg Oral Daily  . enoxaparin (LOVENOX) injection  40 mg Subcutaneous Q24H  . famotidine (PEPCID) IV  20 mg Intravenous Q12H  . insulin aspart  0-5 Units Subcutaneous QHS  . insulin aspart  0-9 Units Subcutaneous TID WC  . levofloxacin  500 mg Oral Daily   Continuous Infusions: . sodium chloride 100 mL/hr at 05/03/13 1453    Time spent: > 35 minutes    [facility] Pager [phone]. If 7PM-7AM, please contact night-coverage at [email or web address], password [facility] 05/04/2013, 10:56 AM  LOS: 2 days

## 2013-05-04 NOTE — Progress Notes (Addendum)
Feels much better. No new complaints.   Filed Vitals:   05/04/13 0200 05/04/13 0400 05/04/13 0625 05/04/13 0800  BP: 91/59 93/61 110/59   Pulse: 66 67 58   Temp:  97.7 F (36.5 C)  98.5 F (36.9 C)  TempSrc:  Oral  Oral  Resp: 23 17 18    Height:      Weight:      SpO2: 96% 97% 100%    NAD HEENT WNL No JVD Chest clear RRR s M Abd soft, +BS Ext warm, minimal RLE erythema - nontender   BMET    Component Value Date/Time   NA 138 05/03/2013 0453   K 4.1 05/03/2013 0453   CL 110 05/03/2013 0453   CO2 16* 05/03/2013 0453   GLUCOSE 128* 05/03/2013 0453   BUN 24* 05/03/2013 0453   CREATININE 0.88 05/03/2013 0453   CALCIUM 6.1* 05/03/2013 0453   GFRNONAA 70* 05/03/2013 0453   GFRAA 81* 05/03/2013 0453    CBC    Component Value Date/Time   WBC 12.3* 05/03/2013 0453   RBC 3.04* 05/03/2013 0453   HGB 8.8* 05/03/2013 0453   HCT 27.0* 05/03/2013 0453   PLT 139* 05/03/2013 0453   MCV 88.8 05/03/2013 0453   MCH 28.9 05/03/2013 0453   MCHC 32.6 05/03/2013 0453   RDW 13.6 05/03/2013 0453   LYMPHSABS 0.2* 05/02/2013 2220   MONOABS 0.5 05/02/2013 2220   EOSABS 0.1 05/02/2013 2220   BASOSABS 0.0 05/02/2013 2220    No new CXR  IMPRESSION: Hypotension resolved - presumed due to sepsis Cellulitis - clinically much improved N/V resolved Hyperglycemia - diet controlled @ home  PLAN: Change abx to PO levofloxacin and complete 7 more days Appears ready for discharge to home today or tomorrow. She is certainly ready for transfer out of ICU/SDU  I have placed the order for transfer Consider DC of SSI Note that an AM cortisol level is pending from this AM - the first one was drawn after methylpred given  PCCM will sign off. Please call if we can be of further assistance  Merton Border, MD ; [facility] [phone].  After 5:30 PM or weekends, call [phone]

## 2013-05-04 NOTE — Evaluation (Signed)
Physical Therapy Evaluation Patient Details Name: Tonya [name] MRN: [id number] DOB: 11-10-1950 Today's Date: 05/04/2013   History of Present Illness  Pt is a 63 y.o. female who had worsening redness of her right lower leg seen by her PCP a week ago and diagnosed with cellulitis. She was placed on antibiotic rx of Bactrim and  Venous Ultrasound performed was negative for DVT.  She began to develop fevers and chills and myalgias as well as nausea and vomiting and and she was seen again by her PCP.  She developed a rash all over which PCP felt was most likely due to the Phenergan, and the phenergan was discontinued and she continue to take the Bactrim.   She presented to the ED 4/29 due to continued rash and fevers and chills and myalgias.      Clinical Impression  Patient evaluated by Physical Therapy with no further acute PT needs identified. All education has been completed and the patient has no further questions.  Pt ambulated in hallway without difficulty and denies dizziness.  Pt feels close to baseline and anticipates d/c home tomorrow. No follow-up Physial Therapy or equipment needs. PT is signing off. Thank you for this referral.      Follow Up Recommendations No PT follow up    Equipment Recommendations  None recommended by PT    Recommendations for Other Services       Precautions / Restrictions Precautions Precautions: Fall      Mobility  Bed Mobility Overal bed mobility: Modified Independent                Transfers Overall transfer level: Needs assistance   Transfers: Sit to/from Stand Sit to Stand: Supervision         General transfer comment: only supervision due to high ICU bed, lines/leads  Ambulation/Gait Ambulation/Gait assistance: Supervision Ambulation Distance (Feet): 400 Feet Assistive device: None Gait Pattern/deviations: WFL(Within Functional Limits) Gait velocity: decr   General Gait Details: pt reports slight weakness however  feels much better today, no unsteadiness observed, pt denies dizziness  Stairs            Wheelchair Mobility    Modified Rankin (Stroke Patients Only)       Balance                                             Pertinent Vitals/Pain BP: Supine at rest prior to entering room: 116/72 mmHg Sitting: 131/92 Standing: 153/136 (error?) Recheck standing: 115/66 No dizziness reported throughout session    Home Living Family/patient expects to be discharged to:: Private residence Living Arrangements: Non-relatives/Friends Available Help at Discharge: Friend(s);Available PRN/intermittently Type of Home: House Home Access: Level entry     Home Layout: Other (Comment) (elevator) Home Equipment: None Additional Comments: Pt lives with a MD friend and her friend's son who are like family to her.    Prior Function Level of Independence: Independent               Hand Dominance        Extremity/Trunk Assessment               Lower Extremity Assessment: Generalized weakness (pt reports being in bed 3 days prior to her admission)         Communication   Communication: No difficulties  Cognition Arousal/Alertness: Awake/alert Behavior During Therapy: [facility] for  tasks assessed/performed Overall Cognitive Status: Within Functional Limits for tasks assessed                      General Comments      Exercises        Assessment/Plan    PT Assessment Patent does not need any further PT services  PT Diagnosis     PT Problem List    PT Treatment Interventions     PT Goals (Current goals can be found in the Care Plan section) Acute Rehab PT Goals PT Goal Formulation: No goals set, d/c therapy    Frequency     Barriers to discharge        Co-evaluation               End of Session Equipment Utilized During Treatment: Gait belt Activity Tolerance: Patient tolerated treatment well Patient left: with family/visitor  present;with call bell/phone within reach           Time: 1349-1406 PT Time Calculation (min): 17 min   Charges:   PT Evaluation $Initial PT Evaluation Tier I: 1 Procedure PT Treatments $Gait Training: 8-22 mins   PT G CodesJunius Argyle 05/04/2013, 3:20 PM Carmelia Bake, PT, DPT 05/04/2013 Pager: [phone]

## 2013-05-05 LAB — GLUCOSE, CAPILLARY: GLUCOSE-CAPILLARY: 98 mg/dL (ref 70–99)

## 2013-05-05 MED ORDER — LEVOFLOXACIN 500 MG PO TABS: 500.0000 mg | ORAL_TABLET | Freq: Every day | ORAL | Status: DC

## 2013-05-05 MED ORDER — CALCIUM CARBONATE ANTACID 500 MG PO CHEW
1.0000 | CHEWABLE_TABLET | Freq: Every day | ORAL | Status: DC
  Filled 2013-05-05: qty 1

## 2013-05-05 NOTE — Discharge Summary (Signed)
Physician Discharge Summary  Quentin Strebel [facility] BTD:[id number] DOB: 05-31-1950 DOA: 05/02/2013  PCP: Arnette Norris, MD  Admit date: 05/02/2013 Discharge date: 05/05/2013  Time spent: > 35 minutes  Recommendations for Outpatient Follow-up:  1. Please be sure to follow up with your primary care physician. 2. Recommend following up with calcium levels 3. WBC trending down on antibiotics but may consider rechecking values on post discharge follow up 4. Will be discharged on Levaquin to complete 6 more days which will complete a total of 10 days of antibiotic therapy. 5. Assess patient's blood sugars and adjust treatment regimen as necessary  Discharge Diagnoses:  Principal Problem:   SIRS (systemic inflammatory response syndrome) Active Problems:   VITAMIN D DEFICIENCY   DIABETES MELLITUS, HX OF   Allergic reaction caused by a drug   Cellulitis and abscess of leg   Hypotension   Hypoalbuminemia   AKI (acute kidney injury)   Leukocytosis   Severe sepsis(995.92)   Discharge Condition: stable  Diet recommendation: Carb modified  Filed Weights   05/03/13 0300  Weight: 80.6 kg (177 lb 11.1 oz)    History of present illness:  Pt is a 63 y/o CF that presented to the ED with worsening redness of her right lower leg 1 wk prior to her initial evaluation. Pt was also complaining of generalized rash that occurred after bactrim ingestion, as well as chills and myalgias.  Due to lower blood pressures patient was observed in stepdown.  Hospital Course:  Severe sepsis(995.92)/hypotension  - Etiology of hypotension either from infectious or anaphylactoid reaction to bactrim.  - improved and hypotension resolved with IVF rehydration - PT had no further follow up or equipment recommendations  - Will continue levaquin on discharge as indicated above  Active Problems:  DIABETES MELLITUS, HX OF  - Continue carb modified diet  - SSI   Allergic reaction caused by a drug  - Avoiding sulfa drugs   - resolved  Cellulitis and abscess of leg  - Levaquin on board - most likely contributed to principle problem listed above.  AKI (acute kidney injury)  - resolved and most likely related to hypotension/ prerenal causes   Leukocytosis  - resolving on current antibiotic regimen.  - Will continue levaquin on discharge   Procedures:  None  Consultations:  PCCM  Discharge Exam: Filed Vitals:   05/04/13 2131  BP: 163/80  Pulse: 92  Temp: 97.8 F (36.6 C)  Resp: 20    General: Pt in NAD, alert and awake, sitting up smiling Cardiovascular: RRR, no MRG Respiratory: CTA BL, no wheezes  Discharge Instructions You were cared for by a hospitalist during your hospital stay. If you have any questions about your discharge medications or the care you received while you were in the hospital after you are discharged, you can call the unit and asked to speak with the hospitalist on call if the hospitalist that took care of you is not available. Once you are discharged, your primary care physician will handle any further medical issues. Please note that NO REFILLS for any discharge medications will be authorized once you are discharged, as it is imperative that you return to your primary care physician (or establish a relationship with a primary care physician if you do not have one) for your aftercare needs so that they can reassess your need for medications and monitor your lab values.  Discharge Orders   Future Orders Complete By Expires   Call MD for:  difficulty breathing, headache or  visual disturbances  As directed    Call MD for:  extreme fatigue  As directed    Call MD for:  persistant dizziness or light-headedness  As directed    Call MD for:  redness, tenderness, or signs of infection (pain, swelling, redness, odor or green/yellow discharge around incision site)  As directed    Call MD for:  severe uncontrolled pain  As directed    Call MD for:  temperature >100.4  As directed     Diet - low sodium heart healthy  As directed    Discharge instructions  As directed    Increase activity slowly  As directed        Medication List    STOP taking these medications       phentermine 15 MG capsule     sulfamethoxazole-trimethoprim 800-160 MG per tablet  Commonly known as:  BACTRIM DS     Vitamin D (Ergocalciferol) 50000 UNITS Caps capsule  Commonly known as:  DRISDOL      TAKE these medications       alendronate 70 MG tablet  Commonly known as:  FOSAMAX  TAKE 1 TABLET BY MOUTH ONCE WEEKLY SUNDAY     aspirin 81 MG tablet  Take 81 mg by mouth daily.     cholecalciferol 1000 UNITS tablet  Commonly known as:  VITAMIN D  Take 1,000 Units by mouth daily.     ferrous sulfate 325 (65 FE) MG tablet  Commonly known as:  FERROUSUL  Take 1 tablet (325 mg total) by mouth daily with breakfast.     levofloxacin 500 MG tablet  Commonly known as:  LEVAQUIN  Take 1 tablet (500 mg total) by mouth daily.  Start taking on:  05/06/2013       Allergies  Allergen Reactions  . Bactrim [Sulfamethoxazole-Tmp Ds] Hives  . Phenergan [Promethazine Hcl]     Hives  . Penicillins     REACTION: As a child.      The results of significant diagnostics from this hospitalization (including imaging, microbiology, ancillary and laboratory) are listed below for reference.    Significant Diagnostic Studies: No results found.  Microbiology: Recent Results (from the past 240 hour(s))  CULTURE, BLOOD (ROUTINE X 2)     Status: None   Collection Time    05/02/13 10:20 PM      Result Value Ref Range Status   Specimen Description BLOOD RIGHT ANTECUBITAL   Final   Special Requests BOTTLES DRAWN AEROBIC AND ANAEROBIC 5ML EACH   Final   Culture  Setup Time     Final   Value: 05/03/2013 01:59     Performed at [facility]   Culture     Final   Value:        BLOOD CULTURE RECEIVED NO GROWTH TO DATE CULTURE WILL BE HELD FOR 5 DAYS BEFORE ISSUING A FINAL NEGATIVE REPORT      Performed at [facility]   Report Status PENDING   Incomplete  CULTURE, BLOOD (ROUTINE X 2)     Status: None   Collection Time    05/02/13 10:30 PM      Result Value Ref Range Status   Specimen Description BLOOD BLOOD LEFT FOREARM   Final   Special Requests BOTTLES DRAWN AEROBIC AND ANAEROBIC 1CC   Final   Culture  Setup Time     Final   Value: 05/03/2013 01:59     Performed at [facility]  Final   Value:        BLOOD CULTURE RECEIVED NO GROWTH TO DATE CULTURE WILL BE HELD FOR 5 DAYS BEFORE ISSUING A FINAL NEGATIVE REPORT     Performed at [facility]   Report Status PENDING   Incomplete  MRSA PCR SCREENING     Status: None   Collection Time    05/03/13  3:01 AM      Result Value Ref Range Status   MRSA by PCR NEGATIVE  NEGATIVE Final   Comment:            The GeneXpert MRSA Assay (FDA     approved for NASAL specimens     only), is one component of a     comprehensive MRSA colonization     surveillance program. It is not     intended to diagnose MRSA     infection nor to guide or     monitor treatment for     MRSA infections.     Labs: Basic Metabolic Panel:  Recent Labs Lab 05/02/13 2220 05/03/13 0453  NA 132* 138  K 4.1 4.1  CL 96 110  CO2 19 16*  GLUCOSE 183* 128*  BUN 28* 24*  CREATININE 1.31* 0.88  CALCIUM 8.1* 6.1*   Liver Function Tests:  Recent Labs Lab 05/02/13 2220  AST 44*  ALT 89*  ALKPHOS 122*  BILITOT 0.7  PROT 6.1  ALBUMIN 2.7*    Recent Labs Lab 05/02/13 2220  LIPASE 82*   No results found for this basename: AMMONIA,  in the last 168 hours CBC:  Recent Labs Lab 05/02/13 2220 05/03/13 0453  WBC 18.0* 12.3*  NEUTROABS 17.2*  --   HGB 11.8* 8.8*  HCT 35.9* 27.0*  MCV 88.2 88.8  PLT 163 139*   Cardiac Enzymes: No results found for this basename: CKTOTAL, CKMB, CKMBINDEX, TROPONINI,  in the last 168 hours BNP: BNP (last 3 results) No results found for this basename: PROBNP,  in the  last 8760 hours CBG:  Recent Labs Lab 05/04/13 0732 05/04/13 1140 05/04/13 1650 05/04/13 1738 05/04/13 2232  GLUCAP 96 104* 90 85 98       Signed:  [place]  [facility] 05/05/2013, 11:00 AM

## 2013-05-06 ENCOUNTER — Emergency Department (HOSPITAL_COMMUNITY): Payer: Medicare Other

## 2013-05-06 ENCOUNTER — Encounter (HOSPITAL_COMMUNITY): Payer: Self-pay | Admitting: Emergency Medicine

## 2013-05-06 ENCOUNTER — Inpatient Hospital Stay (HOSPITAL_COMMUNITY)
Admission: EM | Admit: 2013-05-06 | Discharge: 2013-05-08 | DRG: 293 | Disposition: A | Discharge status: Home / Self Care | Payer: Medicare Other | Attending: Internal Medicine | Admitting: Internal Medicine

## 2013-05-06 DIAGNOSIS — E119 Type 2 diabetes mellitus without complications: Secondary | ICD-10-CM | POA: Diagnosis present

## 2013-05-06 DIAGNOSIS — I2510 Atherosclerotic heart disease of native coronary artery without angina pectoris: Secondary | ICD-10-CM

## 2013-05-06 DIAGNOSIS — R7989 Other specified abnormal findings of blood chemistry: Secondary | ICD-10-CM | POA: Diagnosis present

## 2013-05-06 DIAGNOSIS — Z862 Personal history of diseases of the blood and blood-forming organs and certain disorders involving the immune mechanism: Secondary | ICD-10-CM

## 2013-05-06 DIAGNOSIS — I10 Essential (primary) hypertension: Secondary | ICD-10-CM | POA: Diagnosis present

## 2013-05-06 DIAGNOSIS — Z8639 Personal history of other endocrine, nutritional and metabolic disease: Secondary | ICD-10-CM

## 2013-05-06 DIAGNOSIS — Z9884 Bariatric surgery status: Secondary | ICD-10-CM

## 2013-05-06 DIAGNOSIS — I509 Heart failure, unspecified: Secondary | ICD-10-CM

## 2013-05-06 DIAGNOSIS — I5031 Acute diastolic (congestive) heart failure: Principal | ICD-10-CM

## 2013-05-06 DIAGNOSIS — R778 Other specified abnormalities of plasma proteins: Secondary | ICD-10-CM

## 2013-05-06 DIAGNOSIS — R748 Abnormal levels of other serum enzymes: Secondary | ICD-10-CM | POA: Diagnosis present

## 2013-05-06 DIAGNOSIS — M949 Disorder of cartilage, unspecified: Secondary | ICD-10-CM

## 2013-05-06 DIAGNOSIS — Z8249 Family history of ischemic heart disease and other diseases of the circulatory system: Secondary | ICD-10-CM

## 2013-05-06 DIAGNOSIS — R799 Abnormal finding of blood chemistry, unspecified: Secondary | ICD-10-CM

## 2013-05-06 DIAGNOSIS — Z8679 Personal history of other diseases of the circulatory system: Secondary | ICD-10-CM

## 2013-05-06 DIAGNOSIS — E559 Vitamin D deficiency, unspecified: Secondary | ICD-10-CM

## 2013-05-06 DIAGNOSIS — M899 Disorder of bone, unspecified: Secondary | ICD-10-CM

## 2013-05-06 DIAGNOSIS — D649 Anemia, unspecified: Secondary | ICD-10-CM | POA: Diagnosis present

## 2013-05-06 LAB — BASIC METABOLIC PANEL
BUN: 9 mg/dL (ref 6–23)
CO2: 23 mEq/L (ref 19–32)
Calcium: 7.8 mg/dL — ABNORMAL LOW (ref 8.4–10.5)
Chloride: 110 mEq/L (ref 96–112)
Creatinine, Ser: 0.59 mg/dL (ref 0.50–1.10)
GFR calc Af Amer: >90 mL/min (ref >90)
GFR calc non Af Amer: >90 mL/min (ref >90)
Glucose, Bld: 91 mg/dL (ref 70–99)
Potassium: 3.9 mEq/L (ref 3.7–5.3)
Sodium: 145 mEq/L (ref 137–147)

## 2013-05-06 LAB — I-STAT CHEM 8, ED
BUN: 8 mg/dL (ref 6–23)
CALCIUM ION: 1.05 mmol/L — AB (ref 1.13–1.30)
CHLORIDE: 108 meq/L (ref 96–112)
Creatinine, Ser: 0.7 mg/dL (ref 0.50–1.10)
GLUCOSE: 90 mg/dL (ref 70–99)
HEMATOCRIT: 35 % — AB (ref 36.0–46.0)
Hemoglobin: 11.9 g/dL — ABNORMAL LOW (ref 12.0–15.0)
Potassium: 3.7 mEq/L (ref 3.7–5.3)
Sodium: 147 mEq/L (ref 137–147)
TCO2: 24 mmol/L (ref 0–100)

## 2013-05-06 LAB — I-STAT TROPONIN, ED: Troponin i, poc: 0.18 ng/mL (ref 0.00–0.08)

## 2013-05-06 LAB — CBC
HEMATOCRIT: 34.9 % — AB (ref 36.0–46.0)
Hemoglobin: 11.5 g/dL — ABNORMAL LOW (ref 12.0–15.0)
MCH: 28.7 pg (ref 26.0–34.0)
MCHC: 33 g/dL (ref 30.0–36.0)
MCV: 87 fL (ref 78.0–100.0)
Platelets: 266 10*3/uL (ref 150–400)
RBC: 4.01 MIL/uL (ref 3.87–5.11)
RDW: 14.2 % (ref 11.5–15.5)
WBC: 7.9 10*3/uL (ref 4.0–10.5)

## 2013-05-06 LAB — LIPASE, BLOOD: Lipase: 87 U/L — ABNORMAL HIGH (ref 11–59)

## 2013-05-06 LAB — PRO B NATRIURETIC PEPTIDE: Pro B Natriuretic peptide (BNP): 3880 pg/mL — ABNORMAL HIGH (ref 0–125)

## 2013-05-06 LAB — TROPONIN I: Troponin I: <0.3 ng/mL (ref <0.30)

## 2013-05-06 MED ORDER — VITAMIN D3 25 MCG (1000 UNIT) PO TABS
1000.0000 [IU] | ORAL_TABLET | Freq: Every day | ORAL | Status: DC
  Administered 2013-05-07 – 2013-05-08 (×2): 1000 [IU] via ORAL
  Filled 2013-05-06 (×2): qty 1

## 2013-05-06 MED ORDER — LEVOFLOXACIN 500 MG PO TABS
500.0000 mg | ORAL_TABLET | Freq: Every day | ORAL | Status: DC
  Administered 2013-05-07 – 2013-05-08 (×2): 500 mg via ORAL
  Filled 2013-05-06 (×2): qty 1

## 2013-05-06 MED ORDER — SODIUM CHLORIDE 0.9 % IJ SOLN: 3.0000 mL | INTRAMUSCULAR | Status: DC | PRN

## 2013-05-06 MED ORDER — SODIUM CHLORIDE 0.9 % IV SOLN: 250.0000 mL | INTRAVENOUS | Status: DC | PRN

## 2013-05-06 MED ORDER — ALENDRONATE SODIUM 70 MG PO TABS
70.0000 mg | ORAL_TABLET | ORAL | Status: DC
  Filled 2013-05-06: qty 1

## 2013-05-06 MED ORDER — FUROSEMIDE 10 MG/ML IJ SOLN
40.0000 mg | Freq: Two times a day (BID) | INTRAMUSCULAR | Status: DC
  Administered 2013-05-07 (×2): 40 mg via INTRAVENOUS
  Filled 2013-05-06 (×5): qty 4

## 2013-05-06 MED ORDER — FERROUS SULFATE 325 (65 FE) MG PO TABS
325.0000 mg | ORAL_TABLET | Freq: Every day | ORAL | Status: DC
  Administered 2013-05-07 – 2013-05-08 (×2): 325 mg via ORAL
  Filled 2013-05-06 (×3): qty 1

## 2013-05-06 MED ORDER — FUROSEMIDE 10 MG/ML IJ SOLN
40.0000 mg | Freq: Once | INTRAMUSCULAR | Status: AC
  Administered 2013-05-06: 40 mg via INTRAVENOUS
  Filled 2013-05-06: qty 4

## 2013-05-06 MED ORDER — SODIUM CHLORIDE 0.9 % IJ SOLN
3.0000 mL | Freq: Two times a day (BID) | INTRAMUSCULAR | Status: DC
  Administered 2013-05-06 – 2013-05-08 (×3): 3 mL via INTRAVENOUS

## 2013-05-06 MED ORDER — ASPIRIN 81 MG PO CHEW
81.0000 mg | CHEWABLE_TABLET | Freq: Every day | ORAL | Status: DC
  Administered 2013-05-07 – 2013-05-08 (×2): 81 mg via ORAL
  Filled 2013-05-06 (×2): qty 1

## 2013-05-06 NOTE — H&P (Signed)
[facility] History and Physical  Tonya [name] TDD:[id number] DOB: January 20, 1950 DOA: 05/06/2013  Referring physician: ER physician PCP: Arnette Norris, MD   Chief Complaint: lower extremity swelling  HPI:  63 year old female with past medical history of CAD, vitamin D deficiency, obesity, hypertension, diabetes, just recently discharged from hospital (05/05/2013) when she was hospitalized for sepsis thought to be duet o anaphylactic reaction to bactrim and right lower extremity cellulitis. She now comes back duet to worsening fatigue, weakness and lower extremity swelling. She did not have significant shortness of breath. No complaints of chest pain, no palpitations. No fevers or chills. No cough. No abdominal pain, nausea or vomiting. No reports of diarrhea or constipation. No reports of blood in stool or urine. In ED, vitals are stable. Blood work showed mild anemia with Hgb of 11.5 otherwise unremarkable. BNP was elevated at 3880. We do not have previous 2-D ECHO on file. CXR showed bilateral pleural effusions. She was started on lasix in ED, given 40 mg IV once. She was admitted for management of acute decompensated CHF.  Assessment and Plan:  Principal Problem:   Acute diastolic congestive heart failure - BNP elevated at 3880; follow up 2 D ECHO - the 12 lead EKG showed normal sinus rhythm - the first troponin in ED was mildly elevated at 0.18 but she did not have complaints of chest pain  - cycle cardiac enzymes - continue aspirin - we will call cardiology in am for an official consult Active Problems:   Elevated troponin - normal EKG, pt has no complaints of chest pain at this time - cycle cardiac enzymes x 3; the first troponin in ED was 0.18 but we need to repeat it to make sure stable - continue aspirin - cardiology will be consulted in am   Radiological Exams on Admission: Dg Chest 2 View 05/06/2013    IMPRESSION: 1. Bilateral pleural effusions.      EKG: Normal  sinus rhythm  Code Status: Full Family Communication: Pt at bedside Disposition Plan: Admit for further evaluation  Robbie Lis, MD  [facility] Pager [phone]  Review of Systems:  Constitutional: Negative for fever, chills and positive for malaise/fatigue. Negative for diaphoresis.  HENT: Negative for hearing loss, ear pain, nosebleeds, congestion, sore throat, neck pain, tinnitus and ear discharge.   Eyes: Negative for blurred vision, double vision, photophobia, pain, discharge and redness.  Respiratory: Negative for cough, hemoptysis, sputum production, shortness of breath, wheezing and stridor.   Cardiovascular: Negative for chest pain, palpitations, orthopnea, claudication Gastrointestinal: positive for nausea, no vomiting and abdominal pain. Negative for heartburn, constipation, blood in stool and melena.  Genitourinary: Negative for dysuria, urgency, frequency, hematuria and flank pain.  Musculoskeletal: Negative for myalgias, back pain, joint pain and falls.  Skin: Negative for itching and rash.  Neurological: Negative for dizziness and weakness. Negative for tingling, tremors, sensory change, speech change, focal weakness, loss of consciousness and headaches.  Endo/Heme/Allergies: Negative for environmental allergies and polydipsia. Does not bruise/bleed easily.  Psychiatric/Behavioral: Negative for suicidal ideas. The patient is not nervous/anxious.      Past Medical History  Diagnosis Date  . Coronary artery disease 07/2003  . Obesity 2003    s/p gastric bypass   . Hypertension   . Diabetes mellitus    Past Surgical History  Procedure Laterality Date  . Gastric bypass  2003  . Cholecystectomy  1999   Social History:  reports that she has never smoked. She does not have  any smokeless tobacco history on file. She reports that she does not drink alcohol or use illicit drugs.  Allergies  Allergen Reactions  . Bactrim [Sulfamethoxazole-Tmp Ds] Hives  .  Phenergan [Promethazine Hcl]     Hives  . Penicillins     REACTION: As a child.     Family History  Problem Relation Age of Onset  . Heart attack Mother   . Heart attack Father      Prior to Admission medications   Medication Sig Start Date End Date Taking? Authorizing Provider  alendronate (FOSAMAX) 70 MG tablet Take 70 mg by mouth once a week. Take with a full glass of water on an empty stomach.   Yes Historical Provider, MD  aspirin 81 MG tablet Take 81 mg by mouth daily.   Yes Historical Provider, MD  cholecalciferol (VITAMIN D) 1000 UNITS tablet Take 1,000 Units by mouth daily.     Yes Historical Provider, MD  ferrous sulfate (FERROUSUL) 325 (65 FE) MG tablet Take 1 tablet (325 mg total) by mouth daily with breakfast. 04/20/13  Yes Lucille Passy, MD  levofloxacin (LEVAQUIN) 500 MG tablet Take 1 tablet (500 mg total) by mouth daily. 05/06/13  Yes Velvet Bathe, MD   Physical Exam: Filed Vitals:   05/06/13 1528 05/06/13 1848  BP: 149/79 138/70  Pulse: 78 68  Temp: 98 F (36.7 C) 98.5 F (36.9 C)  TempSrc: Oral Oral  Resp: 18 20  Height: 5\' 1"  (1.549 m) 5\' 1"  (1.549 m)  Weight: 80.287 kg (177 lb) 83.326 kg (183 lb 11.2 oz)  SpO2: 98% 100%    Physical Exam  Constitutional: Appears well-developed and well-nourished. No distress.  HENT: Normocephalic. External right and left ear normal. Oropharynx is clear and moist.  Eyes: Conjunctivae and EOM are normal. PERRLA, no scleral icterus.  Neck: Normal ROM. Neck supple. No JVD. No tracheal deviation. No thyromegaly.  CVS: RRR, S1/S2 +, no murmurs, no gallops, no carotid bruit.  Pulmonary: Effort and breath sounds normal, no stridor, rhonchi, wheezes, rales.  Abdominal: Soft. BS +,  no distension, tenderness, rebound or guarding.  Musculoskeletal: Normal range of motion. LE edema but no tenderness.  Lymphadenopathy: No lymphadenopathy noted, cervical, inguinal. Neuro: Alert. Normal reflexes, muscle tone coordination. No cranial  nerve deficit. Skin: Skin is warm and dry. No rash noted. Not diaphoretic. No erythema. No pallor.  Psychiatric: Normal mood and affect. Behavior, judgment, thought content normal.   Labs on Admission:  Basic Metabolic Panel:  Recent Labs Lab 05/02/13 2220 05/03/13 0453 05/06/13 1600 05/06/13 1612  NA 132* 138 145 147  K 4.1 4.1 3.9 3.7  CL 96 110 110 108  CO2 19 16* 23  --   GLUCOSE 183* 128* 91 90  BUN 28* 24* 9 8  CREATININE 1.31* 0.88 0.59 0.70  CALCIUM 8.1* 6.1* 7.8*  --    Liver Function Tests:  Recent Labs Lab 05/02/13 2220  AST 44*  ALT 89*  ALKPHOS 122*  BILITOT 0.7  PROT 6.1  ALBUMIN 2.7*    Recent Labs Lab 05/02/13 2220 05/06/13 1601  LIPASE 82* 87*   No results found for this basename: AMMONIA,  in the last 168 hours CBC:  Recent Labs Lab 05/02/13 2220 05/03/13 0453 05/06/13 1600 05/06/13 1612  WBC 18.0* 12.3* 7.9  --   NEUTROABS 17.2*  --   --   --   HGB 11.8* 8.8* 11.5* 11.9*  HCT 35.9* 27.0* 34.9* 35.0*  MCV 88.2 88.8 87.0  --  PLT 163 139* 266  --    Cardiac Enzymes: No results found for this basename: CKTOTAL, CKMB, CKMBINDEX, TROPONINI,  in the last 168 hours BNP: No components found with this basename: POCBNP,  CBG:  Recent Labs Lab 05/04/13 0732 05/04/13 1140 05/04/13 1650 05/04/13 1738 05/04/13 2232  GLUCAP 96 104* 90 85 98    If 7PM-7AM, please contact night-coverage [email or web address] Password [email or web address] 05/06/2013, 7:52 PM

## 2013-05-06 NOTE — Progress Notes (Signed)
PHARMACIST - PHYSICIAN COMMUNICATION  CONCERNING: P&T Medication Policy Regarding Oral Bisphosphonates  RECOMMENDATION: Your order for alendronate (Fosamax), ibandronate (Boniva), or risedronate (Actonel) has been discontinued at this time.  If the patient's post-hospital medical condition warrants safe use of this class of drugs, please resume the pre-hospital regimen upon discharge.  DESCRIPTION:  Alendronate (Fosamax), ibandronate (Boniva), and risedronate (Actonel) can cause severe esophageal erosions in patients who are unable to remain upright at least 30 minutes after taking this medication.   Since brief interruptions in therapy are thought to have minimal impact on bone mineral density, the [facility] has established that bisphosphonate orders should be routinely discontinued during hospitalization.   To override this safety policy and permit administration of Boniva, Fosamax, or Actonel in the hospital, prescribers must write "DO NOT HOLD" in the comments section when placing the order for this class of medications.   Romeo Rabon, PharmD, pager [phone]. 05/06/2013,8:01 PM.

## 2013-05-06 NOTE — ED Provider Notes (Signed)
CSN: [id number]     Arrival date & time 05/06/13  1512 History   First MD Initiated Contact with Patient 05/06/13 1548     Chief Complaint  Patient presents with  . Nausea  . Leg Swelling  . Fatigue   HPI  Patient presents to emergency room with complaints of nausea lower extremity edema and fatigue. Patient was released from the hospital yesterday. She was admitted and treated for lower extremity cellulitis, acute kidney injury and a drug reaction to sulfa. Patient states this morning after eating she has significant for nausea. She did not vomit but she had dry heaves. She also has felt very fatigued. She's also noted that her lower chin and knees are very swollen. She denies any chest pain or shortness of breath. She has not had any fevers. She denies any abdominal pain or diarrhea. Past Medical History  Diagnosis Date  . Coronary artery disease 07/2003  . Obesity 2003    s/p gastric bypass   . Hypertension   . Diabetes mellitus    Past Surgical History  Procedure Laterality Date  . Gastric bypass  2003  . Cholecystectomy  1999   Family History  Problem Relation Age of Onset  . Heart attack Mother   . Heart attack Father    History  Substance Use Topics  . Smoking status: Never Smoker   . Smokeless tobacco: Not on file  . Alcohol Use: No   OB History   Grav Para Term Preterm Abortions TAB SAB Ect Mult Living                 Review of Systems  All other systems reviewed and are negative.     Allergies  Bactrim; Phenergan; and Penicillins  Home Medications   Prior to Admission medications   Medication Sig Start Date End Date Taking? Authorizing Provider  alendronate (FOSAMAX) 70 MG tablet TAKE 1 TABLET BY MOUTH ONCE WEEKLY SUNDAY 12/02/12   Lucille Passy, MD  aspirin 81 MG tablet Take 81 mg by mouth daily.    Historical Provider, MD  cholecalciferol (VITAMIN D) 1000 UNITS tablet Take 1,000 Units by mouth daily.      Historical Provider, MD  ferrous sulfate  (FERROUSUL) 325 (65 FE) MG tablet Take 1 tablet (325 mg total) by mouth daily with breakfast. 04/20/13   Lucille Passy, MD  levofloxacin (LEVAQUIN) 500 MG tablet Take 1 tablet (500 mg total) by mouth daily. 05/06/13   Velvet Bathe, MD   BP 149/79  Pulse 78  Temp(Src) 98 F (36.7 C) (Oral)  Resp 18  Ht 5\' 1"  (1.549 m)  Wt 177 lb (80.287 kg)  BMI 33.46 kg/m2  SpO2 98% Physical Exam  Nursing note and vitals reviewed. Constitutional: She appears well-developed and well-nourished. No distress.  HENT:  Head: Normocephalic and atraumatic.  Right Ear: External ear normal.  Left Ear: External ear normal.  Eyes: Conjunctivae are normal. Right eye exhibits no discharge. Left eye exhibits no discharge. No scleral icterus.  Neck: Neck supple. No tracheal deviation present.  Cardiovascular: Normal rate, regular rhythm and intact distal pulses.   Pulmonary/Chest: Effort normal and breath sounds normal. No stridor. No respiratory distress. She has no wheezes. She has no rales.  Abdominal: Soft. Bowel sounds are normal. She exhibits no distension. There is no tenderness. There is no rebound and no guarding.  Musculoskeletal: She exhibits edema. She exhibits no tenderness.  Edema bilateral lower extremities of the ankles and calves, no erythema  Neurological: She is alert. She has normal strength. No cranial nerve deficit (no facial droop, extraocular movements intact, no slurred speech) or sensory deficit. She exhibits normal muscle tone. She displays no seizure activity. Coordination normal.  Skin: Skin is warm and dry. No rash noted.  Psychiatric: She has a normal mood and affect.    ED Course  Procedures (including critical care time) Labs Review Labs Reviewed  CBC - Abnormal; Notable for the following:    Hemoglobin 11.5 (*)    HCT 34.9 (*)    All other components within normal limits  BASIC METABOLIC PANEL - Abnormal; Notable for the following:    Calcium 7.8 (*)    All other components  within normal limits  LIPASE, BLOOD - Abnormal; Notable for the following:    Lipase 87 (*)    All other components within normal limits  PRO B NATRIURETIC PEPTIDE - Abnormal; Notable for the following:    Pro B Natriuretic peptide (BNP) 3880.0 (*)    All other components within normal limits  BASIC METABOLIC PANEL - Abnormal; Notable for the following:    Potassium 3.1 (*)    Calcium 7.8 (*)    All other components within normal limits  BASIC METABOLIC PANEL - Abnormal; Notable for the following:    Glucose, Bld 115 (*)    All other components within normal limits  PRO B NATRIURETIC PEPTIDE - Abnormal; Notable for the following:    Pro B Natriuretic peptide (BNP) 1335.0 (*)    All other components within normal limits  I-STAT CHEM 8, ED - Abnormal; Notable for the following:    Calcium, Ion 1.05 (*)    Hemoglobin 11.9 (*)    HCT 35.0 (*)    All other components within normal limits  I-STAT TROPOININ, ED - Abnormal; Notable for the following:    Troponin i, poc 0.18 (*)    All other components within normal limits  TROPONIN I  TROPONIN I  TROPONIN I   CXR Bilateral pleural effusion  EKG Interpretation   Date/Time:  Sunday May 06 2013 16:29:56 EDT Ventricular Rate:  63 PR Interval:  151 QRS Duration: 88 QT Interval:  421 QTC Calculation: 431 R Axis:   43 Text Interpretation:  Sinus rhythm Low voltage, extremity leads No  significant change since last tracing Confirmed by Konstantin Lehnen  MD-J, Shabree Tebbetts  ([id number]) on 05/06/2013 4:39:06 PM     Medications  furosemide (LASIX) injection 40 mg (40 mg Intravenous Given 05/06/13 1749)  potassium chloride SA (K-DUR,KLOR-CON) CR tablet 40 mEq (40 mEq Oral Given 05/08/13 0039)    MDM   Final diagnoses:  Elevated troponin  CHF (congestive heart failure)    Pt with persistent fatigue and nausea. Just released from the hospital.  Elevated troponin, persistent edema and increased BNP.  Possible CHF.  Lasix IV given in the ED.  Will admit for  further evaluation.    Kathalene Frames, MD 05/09/13 [phone]

## 2013-05-06 NOTE — ED Notes (Signed)
MD at bedside. 

## 2013-05-06 NOTE — Progress Notes (Addendum)
Notified by RN pt arrived to the floor.

## 2013-05-06 NOTE — ED Notes (Signed)
Pt from home c/o nausea, bilateral lower extremity edema, and fatigue. She was released yesterday from being admitted for a reaction to bactrim. Denies pain.

## 2013-05-07 DIAGNOSIS — I517 Cardiomegaly: Secondary | ICD-10-CM

## 2013-05-07 DIAGNOSIS — M949 Disorder of cartilage, unspecified: Secondary | ICD-10-CM

## 2013-05-07 DIAGNOSIS — M899 Disorder of bone, unspecified: Secondary | ICD-10-CM

## 2013-05-07 LAB — BASIC METABOLIC PANEL
BUN: 8 mg/dL (ref 6–23)
CALCIUM: 7.8 mg/dL — AB (ref 8.4–10.5)
CO2: 27 meq/L (ref 19–32)
CREATININE: 0.62 mg/dL (ref 0.50–1.10)
Chloride: 103 mEq/L (ref 96–112)
GFR calc Af Amer: >90 mL/min (ref >90)
GFR calc non Af Amer: >90 mL/min (ref >90)
Glucose, Bld: 94 mg/dL (ref 70–99)
Potassium: 3.1 mEq/L — ABNORMAL LOW (ref 3.7–5.3)
Sodium: 142 mEq/L (ref 137–147)

## 2013-05-07 LAB — GLUCOSE, CAPILLARY: Glucose-Capillary: 87 mg/dL (ref 70–99)

## 2013-05-07 LAB — TROPONIN I: Troponin I: <0.3 ng/mL (ref <0.30)

## 2013-05-07 MED ORDER — POTASSIUM CHLORIDE CRYS ER 20 MEQ PO TBCR
40.0000 meq | EXTENDED_RELEASE_TABLET | Freq: Two times a day (BID) | ORAL | Status: AC
  Administered 2013-05-07 – 2013-05-08 (×2): 40 meq via ORAL
  Filled 2013-05-07 (×2): qty 2

## 2013-05-07 NOTE — Progress Notes (Signed)
[facility] PROGRESS NOTE  Tonya [name] [facility] IOX:[id number] DOB: June 12, 1950 DOA: 05/06/2013 PCP: Arnette Norris, MD  Brief narrative: 63 year old female with past medical history of CAD, vitamin D deficiency, obesity, hypertension, diabetes, just recently discharged from hospital (05/05/2013) when she was hospitalized for sepsis thought to be duet o anaphylactic reaction to bactrim and right lower extremity cellulitis. She now comes back duet to worsening fatigue, weakness and lower extremity swelling. She did not have significant shortness of breath. No complaints of chest pain, no palpitations. No fevers or chills. In ED, vitals are stable. Blood work showed mild anemia with Hgb of 11.5 otherwise unremarkable. BNP was elevated at 3880. We do not have previous 2-D ECHO on file. CXR showed bilateral pleural effusions. She was started on lasix in ED, given 40 mg IV once. She was admitted for management of acute decompensated CHF.   Assessment and Plan:   Principal Problem:  Acute diastolic congestive heart failure  - BNP elevated at 3880; follow up 2 D ECHO - pending - the 12 lead EKG on admission showed normal sinus rhythm  - the first troponin in ED was mildly elevated at 0.18 but subsequent troponin levels are all WNL - continue aspirin   Active Problems:  Elevated troponin  - normal EKG, pt has no complaints of chest pain at this time; troponin level on admission 0.18 but subsequent troponin levels are all WNL - continue aspirin   Code Status: Full  Family Communication: Pt at bedside  Disposition Plan: Admit for further evaluation    Robbie Lis, MD  [facility] Pager [phone]  If 7PM-7AM, please contact night-coverage [email or web address] Password [email or web address] 05/07/2013, 11:58 AM   LOS: 1 day    HPI/Subjective: Feels better this am.  Objective: Filed Vitals:   05/06/13 1528 05/06/13 1848 05/06/13 2113 05/07/13 0505  BP: 149/79 138/70 132/70 124/69  Pulse: 78 68 65 78   Temp: 98 F (36.7 C) 98.5 F (36.9 C) 98.6 F (37 C)   TempSrc: Oral Oral Oral Oral  Resp: 18 20 16 16   Height: 5\' 1"  (1.549 m) 5\' 1"  (1.549 m)    Weight: 80.287 kg (177 lb) 83.326 kg (183 lb 11.2 oz)  80.967 kg (178 lb 8 oz)  SpO2: 98% 100% 97% 99%    Intake/Output Summary (Last 24 hours) at 05/07/13 1158 Last data filed at 05/07/13 1047  Gross per 24 hour  Intake    363 ml  Output   2950 ml  Net  -2587 ml    Exam:   General:  Pt is alert, follows commands appropriately, not in acute distress  Cardiovascular: Regular rate and rhythm, S1/S2 appreciated   Respiratory: bibasilar crackles, no wheezing   Abdomen: Soft, non tender, non distended, bowel sounds present, no guarding  Extremities: LE edema +1-2, improving, pulses DP and PT palpable bilaterally  Neuro: Grossly nonfocal  Data Reviewed: Basic Metabolic Panel:  Recent Labs Lab 05/02/13 2220 05/03/13 0453 05/06/13 1600 05/06/13 1612 05/07/13 0315  NA 132* 138 145 147 142  K 4.1 4.1 3.9 3.7 3.1*  CL 96 110 110 108 103  CO2 19 16* 23  --  27  GLUCOSE 183* 128* 91 90 94  BUN 28* 24* 9 8 8   CREATININE 1.31* 0.88 0.59 0.70 0.62  CALCIUM 8.1* 6.1* 7.8*  --  7.8*   Liver Function Tests:  Recent Labs Lab 05/02/13 2220  AST 44*  ALT 89*  ALKPHOS 122*  BILITOT 0.7  PROT  6.1  ALBUMIN 2.7*    Recent Labs Lab 05/02/13 2220 05/06/13 1601  LIPASE 82* 87*   No results found for this basename: AMMONIA,  in the last 168 hours CBC:  Recent Labs Lab 05/02/13 2220 05/03/13 0453 05/06/13 1600 05/06/13 1612  WBC 18.0* 12.3* 7.9  --   NEUTROABS 17.2*  --   --   --   HGB 11.8* 8.8* 11.5* 11.9*  HCT 35.9* 27.0* 34.9* 35.0*  MCV 88.2 88.8 87.0  --   PLT 163 139* 266  --    Cardiac Enzymes:  Recent Labs Lab 05/06/13 2150 05/07/13 0315 05/07/13 0905  TROPONINI <0.30 <0.30 <0.30   BNP: No components found with this basename: POCBNP,  CBG:  Recent Labs Lab 05/04/13 1140 05/04/13 1650  05/04/13 1738 05/04/13 2232 05/05/13 0753  GLUCAP 104* 90 85 98 87    Recent Results (from the past 240 hour(s))  CULTURE, BLOOD (ROUTINE X 2)     Status: None   Collection Time    05/02/13 10:20 PM      Result Value Ref Range Status   Specimen Description BLOOD RIGHT ANTECUBITAL   Final   Special Requests BOTTLES DRAWN AEROBIC AND ANAEROBIC 5ML EACH   Final   Culture  Setup Time     Final   Value: 05/03/2013 01:59     Performed at [facility]   Culture     Final   Value:        BLOOD CULTURE RECEIVED NO GROWTH TO DATE CULTURE WILL BE HELD FOR 5 DAYS BEFORE ISSUING A FINAL NEGATIVE REPORT     Performed at [facility]   Report Status PENDING   Incomplete  CULTURE, BLOOD (ROUTINE X 2)     Status: None   Collection Time    05/02/13 10:30 PM      Result Value Ref Range Status   Specimen Description BLOOD BLOOD LEFT FOREARM   Final   Special Requests BOTTLES DRAWN AEROBIC AND ANAEROBIC 1CC   Final   Culture  Setup Time     Final   Value: 05/03/2013 01:59     Performed at [facility]   Culture     Final   Value:        BLOOD CULTURE RECEIVED NO GROWTH TO DATE CULTURE WILL BE HELD FOR 5 DAYS BEFORE ISSUING A FINAL NEGATIVE REPORT     Performed at [facility]   Report Status PENDING   Incomplete  MRSA PCR SCREENING     Status: None   Collection Time    05/03/13  3:01 AM      Result Value Ref Range Status   MRSA by PCR NEGATIVE  NEGATIVE Final   Comment:            The GeneXpert MRSA Assay (FDA     approved for NASAL specimens     only), is one component of a     comprehensive MRSA colonization     surveillance program. It is not     intended to diagnose MRSA     infection nor to guide or     monitor treatment for     MRSA infections.     Studies: Dg Chest 2 View  05/06/2013   CLINICAL DATA:  Weakness and nausea  EXAM: CHEST  2 VIEW  COMPARISON:  08/07/2013  FINDINGS: Normal heart size. Small bilateral pleural effusions are identified. No  interstitial edema or airspace consolidation. Mild  spondylosis is present in the thoracic spine.  IMPRESSION: 1. Bilateral pleural effusions.   Electronically Signed   By: Kerby Moors M.D.   On: 05/06/2013 16:37    Scheduled Meds: . aspirin  81 mg Oral Daily  . cholecalciferol  1,000 Units Oral Daily  . ferrous sulfate  325 mg Oral Q breakfast  . furosemide  40 mg Intravenous Q12H  . levofloxacin  500 mg Oral Daily  . potassium chloride  40 mEq Oral BID  . sodium chloride  3 mL Intravenous Q12H   Continuous Infusions:

## 2013-05-07 NOTE — Progress Notes (Signed)
  Echocardiogram 2D Echocardiogram has been performed.  Tonya [name] 05/07/2013, 11:43 AM

## 2013-05-08 LAB — BASIC METABOLIC PANEL
BUN: 11 mg/dL (ref 6–23)
CALCIUM: 8.8 mg/dL (ref 8.4–10.5)
CO2: 32 mEq/L (ref 19–32)
CREATININE: 0.75 mg/dL (ref 0.50–1.10)
Chloride: 101 mEq/L (ref 96–112)
GFR calc non Af Amer: >90 mL/min (ref >90)
GLUCOSE: 115 mg/dL — AB (ref 70–99)
POTASSIUM: 3.8 meq/L (ref 3.7–5.3)
Sodium: 143 mEq/L (ref 137–147)

## 2013-05-08 LAB — PRO B NATRIURETIC PEPTIDE: Pro B Natriuretic peptide (BNP): 1335 pg/mL — ABNORMAL HIGH (ref 0–125)

## 2013-05-08 MED ORDER — FUROSEMIDE 20 MG PO TABS
20.0000 mg | ORAL_TABLET | Freq: Two times a day (BID) | ORAL | Status: DC
  Administered 2013-05-08: 20 mg via ORAL
  Filled 2013-05-08 (×2): qty 1

## 2013-05-08 MED ORDER — FUROSEMIDE 20 MG PO TABS: 20.0000 mg | ORAL_TABLET | Freq: Every day | ORAL | Status: DC

## 2013-05-08 MED ORDER — POTASSIUM CHLORIDE CRYS ER 20 MEQ PO TBCR: 20.0000 meq | EXTENDED_RELEASE_TABLET | Freq: Every day | ORAL | Status: DC

## 2013-05-08 MED ORDER — POTASSIUM CHLORIDE CRYS ER 10 MEQ PO TBCR
10.0000 meq | EXTENDED_RELEASE_TABLET | Freq: Every day | ORAL | Status: DC
  Administered 2013-05-08: 10 meq via ORAL
  Filled 2013-05-08: qty 1

## 2013-05-08 NOTE — Discharge Summary (Signed)
Physician Discharge Summary  Tonya [name] [facility] UUV:[id number] DOB: [birth year] DOA: 05/06/2013  PCP: Arnette Norris, MD  Admit date: 05/06/2013 Discharge date: 05/08/2013  Recommendations for Outpatient Follow-up:  Please note we started you on lasix 20 mg once a day for congestive heart failure, grade I as we discussed. Please continue potassium supplementation as prescribed. Please followup with primary care physician in one to 2 weeks to make sure symptoms are controlled.  Please weigh yourself daily. Your current weight is 171 lbs. if you're weight gets to 175 lbs then take 40 mg of Lasix once a day.  Please note your 2 D ECHO was normal with the exception of showing grade 1 diastolic congestive heart failure.  Continue Levaquin through tomorrow 05/10/2013.    Discharge Diagnoses:  Principal Problem:   Acute diastolic congestive heart failure Active Problems:   Elevated troponin    Discharge Condition: Patient insists on being discharged home today. She is medically stable for discharge.  Diet recommendation: Heart healthy, low-sodium diet.  History of present illness:  63 year old female with past medical history of CAD, vitamin D deficiency, obesity, hypertension, diabetes, just recently discharged from hospital (05/05/2013) when she was hospitalized for sepsis thought to be duet o anaphylactic reaction to bactrim and right lower extremity cellulitis. Pt presented to [facility] with worsening fatigue, weakness and lower extremity swelling. She did not have significant shortness of breath. No complaints of chest pain, no palpitations. No fevers or chills.  In ED, vitals are stable. Blood work showed mild anemia with Hgb of 11.5 otherwise unremarkable. BNP was elevated at 3880. We do not have previous 2-D ECHO on file. CXR showed bilateral pleural effusions. She was started on lasix in ED, given 40 mg IV once. She was admitted for management of acute decompensated CHF.   Assessment and Plan:    Principal Problem:  Acute diastolic congestive heart failure  - BNP elevated at 3880; follow up BNP 1300 - 2 D ECHO showed normal ejection fraction with grade 1 diastolic dysfunction  - I spoke with Dr. [place] Pink of cardiology  who recommended Lasix 20 mg daily along with potassium 20 mEq daily. She needs close followup with primary care physician and then referral to cardiology.  - the 12 lead EKG on admission showed normal sinus rhythm  - the first troponin in ED was mildly elevated at 0.18 but subsequent troponin levels are all WNL  - continue aspirin   Active Problems:  Elevated troponin  - normal EKG, pt has no complaints of chest pain at this time; troponin level on admission 0.18 but subsequent troponin levels are all WNL  - continue aspirin   Code Status: Full  Family Communication: Pt at bedside     Signed:  Robbie Lis, MD  [facility] 05/08/2013, 1:49 PM  Pager #: [phone]   Discharge Exam: Filed Vitals:   05/08/13 0506  BP: 114/74  Pulse: 77  Temp: 98.1 F (36.7 C)  Resp: 16   Filed Vitals:   05/07/13 0505 05/07/13 1330 05/07/13 2131 05/08/13 0506  BP: 124/69 121/65 119/70 114/74  Pulse: 78 64 79 77  Temp:  98 F (36.7 C) 98.4 F (36.9 C) 98.1 F (36.7 C)  TempSrc: Oral Oral Oral Oral  Resp: 16 16 20 16   Height:      Weight: 80.967 kg (178 lb 8 oz)   77.837 kg (171 lb 9.6 oz)  SpO2: 99% 100% 97% 99%    General: Pt is alert, follows  commands appropriately, not in acute distress Cardiovascular: Regular rate and rhythm, S1/S2 +, no murmurs, no rubs, no gallops Respiratory: Clear to auscultation bilaterally, no wheezing, no crackles, no rhonchi Abdominal: Soft, non tender, non distended, bowel sounds +, no guarding Extremities: no edema, no cyanosis, pulses palpable bilaterally DP and PT Neuro: Grossly nonfocal  Discharge Instructions  Discharge Orders   Future Orders Complete By Expires   Call MD for:  extreme fatigue  As directed     Call MD for:  hives  As directed    Call MD for:  persistant nausea and vomiting  As directed    Call MD for:  redness, tenderness, or signs of infection (pain, swelling, redness, odor or green/yellow discharge around incision site)  As directed    Diet - low sodium heart healthy  As directed    Discharge instructions  As directed        Medication List         alendronate 70 MG tablet  Commonly known as:  FOSAMAX  Take 70 mg by mouth once a week. Take with a full glass of water on an empty stomach.     aspirin 81 MG tablet  Take 81 mg by mouth daily.     cholecalciferol 1000 UNITS tablet  Commonly known as:  VITAMIN D  Take 1,000 Units by mouth daily.     ferrous sulfate 325 (65 FE) MG tablet  Commonly known as:  FERROUSUL  Take 1 tablet (325 mg total) by mouth daily with breakfast.     furosemide 20 MG tablet  Commonly known as:  LASIX  Take 1 tablet (20 mg total) by mouth daily.     levofloxacin 500 MG tablet  Commonly known as:  LEVAQUIN  Take 1 tablet (500 mg total) by mouth daily.     potassium chloride SA 20 MEQ tablet  Commonly known as:  K-DUR,KLOR-CON  Take 1 tablet (20 mEq total) by mouth daily.           Follow-up Information   Follow up with Arnette Norris, MD. Schedule an appointment as soon as possible for a visit in 1 week.   Specialty:  Family Medicine   Contact information:   [place] [address] [phone]        The results of significant diagnostics from this hospitalization (including imaging, microbiology, ancillary and laboratory) are listed below for reference.    Significant Diagnostic Studies: Dg Chest 2 View  05/06/2013   CLINICAL DATA:  Weakness and nausea  EXAM: CHEST  2 VIEW  COMPARISON:  08/07/2013  FINDINGS: Normal heart size. Small bilateral pleural effusions are identified. No interstitial edema or airspace consolidation. Mild spondylosis is present in the thoracic spine.  IMPRESSION: 1. Bilateral  pleural effusions.   Electronically Signed   By: Kerby Moors M.D.   On: 05/06/2013 16:37    Microbiology: Recent Results (from the past 240 hour(s))  CULTURE, BLOOD (ROUTINE X 2)     Status: None   Collection Time    05/02/13 10:20 PM      Result Value Ref Range Status   Specimen Description BLOOD RIGHT ANTECUBITAL   Final   Special Requests BOTTLES DRAWN AEROBIC AND ANAEROBIC 5ML EACH   Final   Culture  Setup Time     Final   Value: 05/03/2013 01:59     Performed at [facility]   Culture     Final   Value:  BLOOD CULTURE RECEIVED NO GROWTH TO DATE CULTURE WILL BE HELD FOR 5 DAYS BEFORE ISSUING A FINAL NEGATIVE REPORT     Performed at [facility]   Report Status PENDING   Incomplete  CULTURE, BLOOD (ROUTINE X 2)     Status: None   Collection Time    05/02/13 10:30 PM      Result Value Ref Range Status   Specimen Description BLOOD BLOOD LEFT FOREARM   Final   Special Requests BOTTLES DRAWN AEROBIC AND ANAEROBIC 1CC   Final   Culture  Setup Time     Final   Value: 05/03/2013 01:59     Performed at [facility]   Culture     Final   Value:        BLOOD CULTURE RECEIVED NO GROWTH TO DATE CULTURE WILL BE HELD FOR 5 DAYS BEFORE ISSUING A FINAL NEGATIVE REPORT     Performed at [facility]   Report Status PENDING   Incomplete  MRSA PCR SCREENING     Status: None   Collection Time    05/03/13  3:01 AM      Result Value Ref Range Status   MRSA by PCR NEGATIVE  NEGATIVE Final   Comment:            The GeneXpert MRSA Assay (FDA     approved for NASAL specimens     only), is one component of a     comprehensive MRSA colonization     surveillance program. It is not     intended to diagnose MRSA     infection nor to guide or     monitor treatment for     MRSA infections.     Labs: Basic Metabolic Panel:  Recent Labs Lab 05/02/13 2220 05/03/13 0453 05/06/13 1600 05/06/13 1612 05/07/13 0315 05/08/13 0343  NA 132* 138 145 147 142  143  K 4.1 4.1 3.9 3.7 3.1* 3.8  CL 96 110 110 108 103 101  CO2 19 16* 23  --  27 32  GLUCOSE 183* 128* 91 90 94 115*  BUN 28* 24* 9 8 8 11   CREATININE 1.31* 0.88 0.59 0.70 0.62 0.75  CALCIUM 8.1* 6.1* 7.8*  --  7.8* 8.8   Liver Function Tests:  Recent Labs Lab 05/02/13 2220  AST 44*  ALT 89*  ALKPHOS 122*  BILITOT 0.7  PROT 6.1  ALBUMIN 2.7*    Recent Labs Lab 05/02/13 2220 05/06/13 1601  LIPASE 82* 87*   No results found for this basename: AMMONIA,  in the last 168 hours CBC:  Recent Labs Lab 05/02/13 2220 05/03/13 0453 05/06/13 1600 05/06/13 1612  WBC 18.0* 12.3* 7.9  --   NEUTROABS 17.2*  --   --   --   HGB 11.8* 8.8* 11.5* 11.9*  HCT 35.9* 27.0* 34.9* 35.0*  MCV 88.2 88.8 87.0  --   PLT 163 139* 266  --    Cardiac Enzymes:  Recent Labs Lab 05/06/13 2150 05/07/13 0315 05/07/13 0905  TROPONINI <0.30 <0.30 <0.30   BNP: BNP (last 3 results)  Recent Labs  05/06/13 1601 05/08/13 0343  PROBNP 3880.0* 1335.0*   CBG:  Recent Labs Lab 05/04/13 1140 05/04/13 1650 05/04/13 1738 05/04/13 2232 05/05/13 0753  GLUCAP 104* 90 85 98 87    Time coordinating discharge: Over 30 minutes

## 2013-05-08 NOTE — Discharge Instructions (Signed)
Heart Failure °Heart failure is a condition in which the heart has trouble pumping blood. This means your heart does not pump blood efficiently for your body to work well. In some cases of heart failure, fluid may back up into your lungs or you may have swelling (edema) in your lower legs. Heart failure is usually a long-term (chronic) condition. It is important for you to take good care of yourself and follow your caregiver's treatment plan. °CAUSES  °Some health conditions can cause heart failure. Those health conditions include: °· High blood pressure (hypertension) causes the heart muscle to work harder than normal. When pressure in the blood vessels is high, the heart needs to pump (contract) with more force in order to circulate blood throughout the body. High blood pressure eventually causes the heart to become stiff and weak. °· Coronary artery disease (CAD) is the buildup of cholesterol and fat (plaque) in the arteries of the heart. The blockage in the arteries deprives the heart muscle of oxygen and blood. This can cause chest pain and may lead to a heart attack. High blood pressure can also contribute to CAD. °· Heart attack (myocardial infarction) occurs when 1 or more arteries in the heart become blocked. The loss of oxygen damages the muscle tissue of the heart. When this happens, part of the heart muscle dies. The injured tissue does not contract as well and weakens the heart's ability to pump blood. °· Abnormal heart valves can cause heart failure when the heart valves do not open and close properly. This makes the heart muscle pump harder to keep the blood flowing. °· Heart muscle disease (cardiomyopathy or myocarditis) is damage to the heart muscle from a variety of causes. These can include drug or alcohol abuse, infections, or unknown reasons. These can increase the risk of heart failure. °· Lung disease makes the heart work harder because the lungs do not work properly. This can cause a strain  on the heart, leading it to fail. °· Diabetes increases the risk of heart failure. High blood sugar contributes to high fat (lipid) levels in the blood. Diabetes can also cause slow damage to tiny blood vessels that carry important nutrients to the heart muscle. When the heart does not get enough oxygen and food, it can cause the heart to become weak and stiff. This leads to a heart that does not contract efficiently. °· Other conditions can contribute to heart failure. These include abnormal heart rhythms, thyroid problems, and low blood counts (anemia). °Certain unhealthy behaviors can increase the risk of heart failure. Those unhealthy behaviors include: °· Being overweight. °· Smoking or chewing tobacco. °· Eating foods high in fat and cholesterol. °· Abusing illicit drugs or alcohol. °· Lacking physical activity. °SYMPTOMS  °Heart failure symptoms may vary and can be hard to detect. Symptoms may include: °· Shortness of breath with activity, such as climbing stairs. °· Persistent cough. °· Swelling of the feet, ankles, legs, or abdomen. °· Unexplained weight gain. °· Difficulty breathing when lying flat (orthopnea). °· Waking from sleep because of the need to sit up and get more air. °· Rapid heartbeat. °· Fatigue and loss of energy. °· Feeling lightheaded, dizzy, or close to fainting. °· Loss of appetite. °· Nausea. °· Increased urination during the night (nocturia). °DIAGNOSIS  °A diagnosis of heart failure is based on your history, symptoms, physical examination, and diagnostic tests. °Diagnostic tests for heart failure may include: °· Echocardiography. °· Electrocardiography. °· Chest X-ray. °· Blood tests. °· Exercise   stress test. °· Cardiac angiography. °· Radionuclide scans. °TREATMENT  °Treatment is aimed at managing the symptoms of heart failure. Medicines, behavioral changes, or surgical intervention may be necessary to treat heart failure. °· Medicines to help treat heart failure may  include: °· Angiotensin-converting enzyme (ACE) inhibitors. This type of medicine blocks the effects of a blood protein called angiotensin-converting enzyme. ACE inhibitors relax (dilate) the blood vessels and help lower blood pressure. °· Angiotensin receptor blockers. This type of medicine blocks the actions of a blood protein called angiotensin. Angiotensin receptor blockers dilate the blood vessels and help lower blood pressure. °· Water pills (diuretics). Diuretics cause the kidneys to remove salt and water from the blood. The extra fluid is removed through urination. This loss of extra fluid lowers the volume of blood the heart pumps. °· Beta blockers. These prevent the heart from beating too fast and improve heart muscle strength. °· Digitalis. This increases the force of the heartbeat. °· Healthy behavior changes include: °· Obtaining and maintaining a healthy weight. °· Stopping smoking or chewing tobacco. °· Eating heart healthy foods. °· Limiting or avoiding alcohol. °· Stopping illicit drug use. °· Physical activity as directed by your caregiver. °· Surgical treatment for heart failure may include: °· A procedure to open blocked arteries, repair damaged heart valves, or remove damaged heart muscle tissue. °· A pacemaker to improve heart muscle function and control certain abnormal heart rhythms. °· An internal cardioverter defibrillator to treat certain serious abnormal heart rhythms. °· A left ventricular assist device to assist the pumping ability of the heart. °HOME CARE INSTRUCTIONS  °· Take your medicine as directed by your caregiver. Medicines are important in reducing the workload of your heart, slowing the progression of heart failure, and improving your symptoms. °· Do not stop taking your medicine unless directed by your caregiver. °· Do not skip any dose of medicine. °· Refill your prescriptions before you run out of medicine. Your medicines are needed every day. °· Take over-the-counter  medicine only as directed by your caregiver or pharmacist. °· Engage in moderate physical activity if directed by your caregiver. Moderate physical activity can benefit some people. The elderly and people with severe heart failure should consult with a caregiver for physical activity recommendations. °· Eat heart healthy foods. Food choices should be free of trans fat and low in saturated fat, cholesterol, and salt (sodium). Healthy choices include fresh or frozen fruits and vegetables, fish, lean meats, legumes, fat-free or low-fat dairy products, and whole grain or high fiber foods. Talk to a dietitian to learn more about heart healthy foods. °· Limit sodium if directed by your caregiver. Sodium restriction may reduce symptoms of heart failure in some people. Talk to a dietitian to learn more about heart healthy seasonings. °· Use healthy cooking methods. Healthy cooking methods include roasting, grilling, broiling, baking, poaching, steaming, or stir-frying. Talk to a dietitian to learn more about healthy cooking methods. °· Limit fluids if directed by your caregiver. Fluid restriction may reduce symptoms of heart failure in some people. °· Weigh yourself every day. Daily weights are important in the early recognition of excess fluid. You should weigh yourself every morning after you urinate and before you eat breakfast. Wear the same amount of clothing each time you weigh yourself. Record your daily weight. Provide your caregiver with your weight record. °· Monitor and record your blood pressure if directed by your caregiver. °· Check your pulse if directed by your caregiver. °· Lose weight if directed   by your caregiver. Weight loss may reduce symptoms of heart failure in some people. °· Stop smoking or chewing tobacco. Nicotine makes your heart work harder by causing your blood vessels to constrict. Do not use nicotine gum or patches before talking to your caregiver. °· Schedule and attend follow-up visits as  directed by your caregiver. It is important to keep all your appointments. °· Limit alcohol intake to no more than 1 drink per day for nonpregnant women and 2 drinks per day for men. Drinking more than that is harmful to your heart. Tell your caregiver if you drink alcohol several times a week. Talk with your caregiver about whether alcohol is safe for you. If your heart has already been damaged by alcohol or you have severe heart failure, drinking alcohol should be stopped completely. °· Stop illicit drug use. °· Stay up-to-date with immunizations. It is especially important to prevent respiratory infections through current pneumococcal and influenza immunizations. °· Manage other health conditions such as hypertension, diabetes, thyroid disease, or abnormal heart rhythms as directed by your caregiver. °· Learn to manage stress. °· Plan rest periods when fatigued. °· Learn strategies to manage high temperatures. If the weather is extremely hot: °· Avoid vigorous physical activity. °· Use air conditioning or fans or seek a cooler location. °· Avoid caffeine and alcohol. °· Wear loose-fitting, lightweight, and light-colored clothing. °· Learn strategies to manage cold temperatures. If the weather is extremely cold: °· Avoid vigorous physical activity. °· Layer clothes. °· Wear mittens or gloves, a hat, and a scarf when going outside. °· Avoid alcohol. °· Obtain ongoing education and support as needed. °· Participate or seek rehabilitation as needed to maintain or improve independence and quality of life. °SEEK MEDICAL CARE IF:  °· Your weight increases by 03 lb/1.4 kg in 1 day or 05 lb/2.3 kg in a week. °· You have increasing shortness of breath that is unusual for you. °· You are unable to participate in your usual physical activities. °· You tire easily. °· You cough more than normal, especially with physical activity. °· You have any or more swelling in areas such as your hands, feet, ankles, or abdomen. °· You  are unable to sleep because it is hard to breathe. °· You feel like your heart is beating fast (palpitations). °· You become dizzy or lightheaded upon standing up. °SEEK IMMEDIATE MEDICAL CARE IF:  °· You have difficulty breathing. °· There is a change in mental status such as decreased alertness or difficulty with concentration. °· You have a pain or discomfort in your chest. °· You have an episode of fainting (syncope). °MAKE SURE YOU:  °· Understand these instructions. °· Will watch your condition. °· Will get help right away if you are not doing well or get worse. °Document Released: 12/21/2004 Document Revised: 04/17/2012 Document Reviewed: 01/13/2012 °[employer]® Patient Information ©2014 [employer]. ° °

## 2013-05-08 NOTE — Progress Notes (Signed)
[place] to pt related to admission with CHF. Pt states that she will not need Home Health, RN gave her information on CHF, she will also continue to go to her PCP. Pt was very pleasant and happy that she was feeling better.

## 2013-05-09 LAB — CULTURE, BLOOD (ROUTINE X 2)
Culture: NO GROWTH
Culture: NO GROWTH

## 2013-05-10 ENCOUNTER — Telehealth: Payer: Self-pay | Admitting: Family Medicine

## 2013-05-10 ENCOUNTER — Telehealth: Payer: Self-pay

## 2013-05-10 NOTE — Telephone Encounter (Signed)
Pt was inpatient at [facility] from 05/01/13 - 05/08/13; pt had airline flight scheduled on 05/04/13 returning on 05/08/13. Pt needs letter to [employer] that pt was in the hospital so she can get a refund for airline ticket. Pt request cb.

## 2013-05-10 NOTE — Telephone Encounter (Signed)
I would really prefer to see this patient since I know her very well and is better management for her continuity of care.  Please combine two of my same day slots on Monday (I know we try not to do this) and put her in for 30 minutes on Monday at 10 am. Thanks.

## 2013-05-10 NOTE — Telephone Encounter (Signed)
Letter written and in my box.

## 2013-05-10 NOTE — Telephone Encounter (Signed)
Pt could not come in on Monday or Tuesday AM.  Appt scheduled for Wed @ 1145.

## 2013-05-10 NOTE — Telephone Encounter (Signed)
Transitional Care Call.  Pt d/c'd from hospital on 05/08/13.  D/C dx: Acute diastolic congestive heart failure.  Spoke with pt.  She said she is "doing fine" since d/c.  She has been taking lasix and Levaquin as directed.  She has been weighing herself daily per d/c instructions.  She states that she has a friend that lives with her that can assist with ADLs as needed.  Denies pain.  Denies questions regarding d/c instructions or med list.  Denies questions for Dr. Deborra Medina.  Pt was a bit upset that when she called this morning, she couldn't get an appt with Dr. Deborra Medina until next Thursday so she scheduled a hospital f/u with Webb Silversmith, NP for 05/14/13.

## 2013-05-10 NOTE — Telephone Encounter (Signed)
Ok thanks 

## 2013-05-10 NOTE — Telephone Encounter (Signed)
Left VM for patient that letter is ready for pick-up

## 2013-05-14 ENCOUNTER — Ambulatory Visit: Payer: Medicare Other | Admitting: Family Medicine

## 2013-05-14 ENCOUNTER — Ambulatory Visit: Payer: Medicare Other | Admitting: Internal Medicine

## 2013-05-16 ENCOUNTER — Ambulatory Visit (INDEPENDENT_AMBULATORY_CARE_PROVIDER_SITE_OTHER): Payer: Medicare Other | Admitting: Family Medicine

## 2013-05-16 VITALS — BP 126/62 | HR 73 | Temp 98.2°F | Wt 170.5 lb

## 2013-05-16 DIAGNOSIS — I5031 Acute diastolic (congestive) heart failure: Secondary | ICD-10-CM

## 2013-05-16 DIAGNOSIS — T50905A Adverse effect of unspecified drugs, medicaments and biological substances, initial encounter: Secondary | ICD-10-CM

## 2013-05-16 LAB — BASIC METABOLIC PANEL
BUN: 13 mg/dL (ref 6–23)
CALCIUM: 9.4 mg/dL (ref 8.4–10.5)
CO2: 29 meq/L (ref 19–32)
CREATININE: 0.7 mg/dL (ref 0.4–1.2)
Chloride: 102 mEq/L (ref 96–112)
GFR: 84.8 mL/min (ref >60.00)
Glucose, Bld: 66 mg/dL — ABNORMAL LOW (ref 70–99)
Potassium: 3.8 mEq/L (ref 3.5–5.1)
SODIUM: 139 meq/L (ref 135–145)

## 2013-05-16 LAB — BRAIN NATRIURETIC PEPTIDE: Pro B Natriuretic peptide (BNP): 30 pg/mL (ref 0.0–100.0)

## 2013-05-16 NOTE — Progress Notes (Signed)
Subjective:   Patient ID: Tonya [name], female    DOB: 11/05/1950, 63 y.o.   MRN: [id number]  Tonya [name] is a pleasant 63 y.o. year old female who presents to clinic today with Hospitalization Follow-up  on 05/16/2013 Contacted from our office on 5/7 for transitional care.  HPI:  Initially admitted to ER end of April for dehydration/sepsis thought to be due to cellulitis and ? Allergic reaction to bactrim or phenergan.  Symptoms resolved and was d/c'd home on 05/05/13.  Returned to ER on 5/3 for worsening fatigue, weakness and LE edema.  No CP or SOB. BNP was elevated to 3880.  CXR showed bilateral effusions. Given 40 mg IV lasix x 1 in ED and had significant response- was admitted for acute CHF.    2 D echo normal EF with grade 1 diastolic dysfunction.  Dr. Haroldine Laws consulted and advised lasix 20 mg daily po with Kdur 20 meq daily.  Advised referral to cards as an outpatient.  Discharge weight 171 lb.  She feels fine but is very "pissed."  She is angry that she has to pay two hospital bills for two admissions.  Patient Active Problem List   Diagnosis Date Noted  . Drug reaction 05/16/2013  . Elevated troponin 05/06/2013  . Acute diastolic congestive heart failure 05/06/2013  . OSTEOPENIA 05/07/2009  . VITAMIN D DEFICIENCY 11/15/2008  . CORONARY ARTERY DISEASE 11/15/2008  . DIABETES MELLITUS, HX OF 11/15/2008  . HYPERTENSION, HX OF 11/15/2008   Past Medical History  Diagnosis Date  . Coronary artery disease 07/2003  . Obesity 2003    s/p gastric bypass   . Hypertension   . Diabetes mellitus    Past Surgical History  Procedure Laterality Date  . Gastric bypass  2003  . Cholecystectomy  1999   History  Substance Use Topics  . Smoking status: Never Smoker   . Smokeless tobacco: Not on file  . Alcohol Use: No   Family History  Problem Relation Age of Onset  . Heart attack Mother   . Heart attack Father    Allergies  Allergen Reactions  . Bactrim  [Sulfamethoxazole-Tmp Ds] Hives  . Phenergan [Promethazine Hcl]     Hives  . Sulfa Antibiotics   . Penicillins     REACTION: As a child.   Current Outpatient Prescriptions on File Prior to Visit  Medication Sig Dispense Refill  . alendronate (FOSAMAX) 70 MG tablet Take 70 mg by mouth once a week. Take with a full glass of water on an empty stomach.      Marland Kitchen aspirin 81 MG tablet Take 81 mg by mouth daily.      . cholecalciferol (VITAMIN D) 1000 UNITS tablet Take 1,000 Units by mouth daily.        . ferrous sulfate (FERROUSUL) 325 (65 FE) MG tablet Take 1 tablet (325 mg total) by mouth daily with breakfast.  30 tablet  3  . furosemide (LASIX) 20 MG tablet Take 1 tablet (20 mg total) by mouth daily.  30 tablet  0  . potassium chloride (K-DUR,KLOR-CON) 20 MEQ tablet Take 1 tablet (20 mEq total) by mouth daily.  30 tablet  0   No current facility-administered medications on file prior to visit.   The PMH, PSH, Social History, Family History, Medications, and allergies have been reviewed in [facility], and have been updated if relevant.    Review of Systems    No CP No SOB No edema Objective:  BP 126/62  Pulse 73  Temp(Src) 98.2 F (36.8 C) (Oral)  Wt 170 lb 8 oz (77.338 kg)  SpO2 97%  Wt Readings from Last 3 Encounters:  05/16/13 170 lb 8 oz (77.338 kg)  05/08/13 171 lb 9.6 oz (77.837 kg)  05/03/13 177 lb 11.1 oz (80.6 kg)     Physical Exam  Nursing note and vitals reviewed. Constitutional: She appears well-developed and well-nourished. No distress.  HENT:  Head: Normocephalic and atraumatic.  Cardiovascular: Normal rate and regular rhythm.   Pulmonary/Chest: Effort normal and breath sounds normal. No respiratory distress. She has no wheezes. She has no rales.  Musculoskeletal: She exhibits no edema.  Skin: Skin is warm.  Psychiatric: She has a normal mood and affect. Her behavior is normal. Judgment and thought content normal.          Assessment & Plan:   Acute  diastolic congestive heart failure - Plan: Ambulatory referral to Cardiology, Brain natriuretic peptide, Basic metabolic panel  Drug reaction No Follow-up on file.

## 2013-05-16 NOTE — Assessment & Plan Note (Signed)
Remains unclear to me at this point. Could have been from sulfa or phenergan but I have added both to her allergy list.

## 2013-05-16 NOTE — Progress Notes (Signed)
Pre visit review using our clinic review tool, if applicable. No additional management support is needed unless otherwise documented below in the visit note. 

## 2013-05-16 NOTE — Assessment & Plan Note (Signed)
Resolved.  Likely due to fluid overload from IVFs. Will check BNP and BMET. She is declining cardiology referral at this time. Will likely stop lasix and potassium and continue home weights. Call or return to clinic prn if these symptoms worsen or fail to improve as anticipated. The patient indicates understanding of these issues and agrees with the plan.

## 2013-05-16 NOTE — Patient Instructions (Signed)
I will call you with your lab results.

## 2013-06-02 ENCOUNTER — Other Ambulatory Visit: Payer: Self-pay | Admitting: Family Medicine

## 2013-06-06 DIAGNOSIS — T887XXA Unspecified adverse effect of drug or medicament, initial encounter: Secondary | ICD-10-CM

## 2013-06-06 DIAGNOSIS — I509 Heart failure, unspecified: Secondary | ICD-10-CM

## 2013-06-06 DIAGNOSIS — I5031 Acute diastolic (congestive) heart failure: Secondary | ICD-10-CM

## 2013-06-12 ENCOUNTER — Ambulatory Visit (INDEPENDENT_AMBULATORY_CARE_PROVIDER_SITE_OTHER): Payer: Medicare Other | Admitting: Internal Medicine

## 2013-06-12 ENCOUNTER — Encounter: Payer: Self-pay | Admitting: Internal Medicine

## 2013-06-12 VITALS — BP 142/76 | HR 61 | Temp 98.1°F | Wt 175.0 lb

## 2013-06-12 DIAGNOSIS — I5030 Unspecified diastolic (congestive) heart failure: Secondary | ICD-10-CM

## 2013-06-12 DIAGNOSIS — R609 Edema, unspecified: Secondary | ICD-10-CM

## 2013-06-12 NOTE — Patient Instructions (Signed)
Edema Edema is an abnormal build-up of fluids in tissues. Because this is partly dependent on gravity (water flows to the lowest place), it is more common in the legs and thighs (lower extremities). It is also common in the looser tissues, like around the eyes. Painless swelling of the feet and ankles is common and increases as a person ages. It may affect both legs and may include the calves or even thighs. When squeezed, the fluid may move out of the affected area and may leave a dent for a few moments. CAUSES   Prolonged standing or sitting in one place for extended periods of time. Movement helps pump tissue fluid into the veins, and absence of movement prevents this, resulting in edema.  Varicose veins. The valves in the veins do not work as well as they should. This causes fluid to leak into the tissues.  Fluid and salt overload.  Injury, burn, or surgery to the leg, ankle, or foot, may damage veins and allow fluid to leak out.  Sunburn damages vessels. Leaky vessels allow fluid to go out into the sunburned tissues.  Allergies (from insect bites or stings, medications or chemicals) cause swelling by allowing vessels to become leaky.  Protein in the blood helps keep fluid in your vessels. Low protein, as in malnutrition, allows fluid to leak out.  Hormonal changes, including pregnancy and menstruation, cause fluid retention. This fluid may leak out of vessels and cause edema.  Medications that cause fluid retention. Examples are sex hormones, blood pressure medications, steroid treatment, or anti-depressants.  Some illnesses cause edema, especially heart failure, kidney disease, or liver disease.  Surgery that cuts veins or lymph nodes, such as surgery done for the heart or for breast cancer, may result in edema. DIAGNOSIS  Your caregiver is usually easily able to determine what is causing your swelling (edema) by simply asking what is wrong (getting a history) and examining you (doing  a physical). Sometimes x-rays, EKG (electrocardiogram or heart tracing), and blood work may be done to evaluate for underlying medical illness. TREATMENT  General treatment includes:  Leg elevation (or elevation of the affected body part).  Restriction of fluid intake.  Prevention of fluid overload.  Compression of the affected body part. Compression with elastic bandages or support stockings squeezes the tissues, preventing fluid from entering and forcing it back into the blood vessels.  Diuretics (also called water pills or fluid pills) pull fluid out of your body in the form of increased urination. These are effective in reducing the swelling, but can have side effects and must be used only under your caregiver's supervision. Diuretics are appropriate only for some types of edema. The specific treatment can be directed at any underlying causes discovered. Heart, liver, or kidney disease should be treated appropriately. HOME CARE INSTRUCTIONS   Elevate the legs (or affected body part) above the level of the heart, while lying down.  Avoid sitting or standing still for prolonged periods of time.  Avoid putting anything directly under the knees when lying down, and do not wear constricting clothing or garters on the upper legs.  Exercising the legs causes the fluid to work back into the veins and lymphatic channels. This may help the swelling go down.  The pressure applied by elastic bandages or support stockings can help reduce ankle swelling.  A low-salt diet may help reduce fluid retention and decrease the ankle swelling.  Take any medications exactly as prescribed. SEEK MEDICAL CARE IF:  Your edema is   not responding to recommended treatments. SEEK IMMEDIATE MEDICAL CARE IF:   You develop shortness of breath or chest pain.  You cannot breathe when you lay down; or if, while lying down, you have to get up and go to the window to get your breath.  You are having increasing  swelling without relief from treatment.  You develop a fever over 102 F (38.9 C).  You develop pain or redness in the areas that are swollen.  Tell your caregiver right away if you have gained 03 lb/1.4 kg in 1 day or 05 lb/2.3 kg in a week. MAKE SURE YOU:   Understand these instructions.  Will watch your condition.  Will get help right away if you are not doing well or get worse. Document Released: 12/21/2004 Document Revised: 06/22/2011 Document Reviewed: 08/09/2007 [employer] Patient Information 2014 [employer].  

## 2013-06-12 NOTE — Progress Notes (Signed)
Subjective:    Patient ID: Tonya [name], female    DOB: [birth year], 63 y.o.   MRN: [id number]  HPI  Pt presents to the clinic today with c/o swelling in her lower extremities. She presented to the ER 05/06/13 with fatigue, weakness and lower extremity swelling. Labs revealed slight anemia with HgB 11.5 and elevated BNP of 3880. She also had a 2D echo which showed grade 1 diastolic dysfunction. She was started on Lasix and Potassium. She saw Dr. Deborra Medina in followup 05/16/13. At that time, her symptoms had resolved. Dr. Deborra Medina felt that her acute CHF was r/t fluid overload from extensive IVF replacement. Repeat BNP was 30. She was advised at that time to d/c lasix and potassium, continue to monitor daily weights and watch for returning peripheral edema. She also declined cardiology referall at that time.  Today, she c/o weight gain and leg swelling. Her weight is up to 175 lbs. She is concerned that her legs are red and shiny as well. She denies fatigue, chest pain or shortness of breath.  Review of Systems     There were no vitals taken for this visit. Wt Readings from Last 3 Encounters:  05/16/13 170 lb 8 oz (77.338 kg)  05/08/13 171 lb 9.6 oz (77.837 kg)  05/03/13 177 lb 11.1 oz (80.6 kg)    General: Appears her stated age, well developed, well nourished in NAD. Cardiovascular: Pt reports bilateral leg swelling. Normal rate and rhythm. S1,S2 noted.  No murmur, rubs or gallops noted. No JVD. No carotid bruits noted. Pulmonary/Chest: Normal effort and positive vesicular breath sounds. No respiratory distress. No wheezes, rales or ronchi noted.    BMET    Component Value Date/Time   NA 139 05/16/2013 1156   K 3.8 05/16/2013 1156   CL 102 05/16/2013 1156   CO2 29 05/16/2013 1156   GLUCOSE 66* 05/16/2013 1156   BUN 13 05/16/2013 1156   CREATININE 0.7 05/16/2013 1156   CALCIUM 9.4 05/16/2013 1156   GFRNONAA >90 05/08/2013 0343   GFRAA >90 05/08/2013 0343    Lipid Panel     Component Value  Date/Time   CHOL 131 11/19/2008 0833   TRIG 97.0 11/19/2008 0833   HDL 43.00 11/19/2008 0833   CHOLHDL 3 11/19/2008 0833   VLDL 19.4 11/19/2008 0833   LDLCALC 69 11/19/2008 0833    CBC    Component Value Date/Time   WBC 7.9 05/06/2013 1600   RBC 4.01 05/06/2013 1600   HGB 11.9* 05/06/2013 1612   HCT 35.0* 05/06/2013 1612   PLT 266 05/06/2013 1600   MCV 87.0 05/06/2013 1600   MCH 28.7 05/06/2013 1600   MCHC 33.0 05/06/2013 1600   RDW 14.2 05/06/2013 1600   LYMPHSABS 0.2* 05/02/2013 2220   MONOABS 0.5 05/02/2013 2220   EOSABS 0.1 05/02/2013 2220   BASOSABS 0.0 05/02/2013 2220    Hgb A1C Lab Results  Component Value Date   HGBA1C 5.1 05/03/2013    Objective:   Physical Exam   BP 142/76  Pulse 61  Temp(Src) 98.1 F (36.7 C) (Oral)  Wt 175 lb (79.379 kg)  SpO2 98% Wt Readings from Last 3 Encounters:  06/12/13 175 lb (79.379 kg)  05/16/13 170 lb 8 oz (77.338 kg)  05/08/13 171 lb 9.6 oz (77.837 kg)    General: Appears her stated age, obese but well developed, well nourished in NAD. Cardiovascular: Normal rate and rhythm. S1,S2 noted.  No murmur, rubs or gallops noted. No JVD. 2+  pitting edema BLE noted. No carotid bruits noted. Pulmonary/Chest: Normal effort and positive vesicular breath sounds. No respiratory distress. No wheezes, rales or ronchi noted.   BMET    Component Value Date/Time   NA 139 05/16/2013 1156   K 3.8 05/16/2013 1156   CL 102 05/16/2013 1156   CO2 29 05/16/2013 1156   GLUCOSE 66* 05/16/2013 1156   BUN 13 05/16/2013 1156   CREATININE 0.7 05/16/2013 1156   CALCIUM 9.4 05/16/2013 1156   GFRNONAA >90 05/08/2013 0343   GFRAA >90 05/08/2013 0343    Lipid Panel     Component Value Date/Time   CHOL 131 11/19/2008 0833   TRIG 97.0 11/19/2008 0833   HDL 43.00 11/19/2008 0833   CHOLHDL 3 11/19/2008 0833   VLDL 19.4 11/19/2008 0833   LDLCALC 69 11/19/2008 0833    CBC    Component Value Date/Time   WBC 7.9 05/06/2013 1600   RBC 4.01 05/06/2013 1600   HGB 11.9* 05/06/2013  1612   HCT 35.0* 05/06/2013 1612   PLT 266 05/06/2013 1600   MCV 87.0 05/06/2013 1600   MCH 28.7 05/06/2013 1600   MCHC 33.0 05/06/2013 1600   RDW 14.2 05/06/2013 1600   LYMPHSABS 0.2* 05/02/2013 2220   MONOABS 0.5 05/02/2013 2220   EOSABS 0.1 05/02/2013 2220   BASOSABS 0.0 05/02/2013 2220    Hgb A1C Lab Results  Component Value Date   HGBA1C 5.1 05/03/2013        Assessment & Plan:   BLE secondary to CHF:  Advised her to take the Lasix prn if she notices > 3 lb weight gain She should take 1 potassium pill as well if she is going to take the lasix She again declines cardiology referral She does have compression hose that she plans on starting to wear  RTC as needed or if symptoms persist or worsen

## 2013-06-12 NOTE — Progress Notes (Signed)
Pre visit review using our clinic review tool, if applicable. No additional management support is needed unless otherwise documented below in the visit note. 

## 2013-07-25 ENCOUNTER — Encounter: Payer: Self-pay | Admitting: Family Medicine

## 2013-07-25 ENCOUNTER — Other Ambulatory Visit: Payer: Self-pay | Admitting: Family Medicine

## 2013-07-25 ENCOUNTER — Ambulatory Visit (INDEPENDENT_AMBULATORY_CARE_PROVIDER_SITE_OTHER): Payer: Medicare Other | Admitting: Family Medicine

## 2013-07-25 VITALS — BP 128/76 | HR 64 | Temp 98.2°F | Wt 175.5 lb

## 2013-07-25 DIAGNOSIS — R609 Edema, unspecified: Secondary | ICD-10-CM

## 2013-07-25 DIAGNOSIS — R600 Localized edema: Secondary | ICD-10-CM | POA: Insufficient documentation

## 2013-07-25 LAB — BASIC METABOLIC PANEL
BUN: 14 mg/dL (ref 6–23)
CO2: 29 mEq/L (ref 19–32)
CREATININE: 0.5 mg/dL (ref 0.4–1.2)
Calcium: 8.8 mg/dL (ref 8.4–10.5)
Chloride: 105 mEq/L (ref 96–112)
GFR: 133.22 mL/min (ref >60.00)
GLUCOSE: 67 mg/dL — AB (ref 70–99)
POTASSIUM: 3.9 meq/L (ref 3.5–5.1)
Sodium: 139 mEq/L (ref 135–145)

## 2013-07-25 LAB — BRAIN NATRIURETIC PEPTIDE: Pro B Natriuretic peptide (BNP): 23 pg/mL (ref 0.0–100.0)

## 2013-07-25 MED ORDER — FUROSEMIDE 20 MG PO TABS: 20.0000 mg | ORAL_TABLET | Freq: Every day | ORAL | Status: DC

## 2013-07-25 NOTE — Progress Notes (Signed)
Pre visit review using our clinic review tool, if applicable. No additional management support is needed unless otherwise documented below in the visit note. 

## 2013-07-25 NOTE — Progress Notes (Signed)
Subjective:   Patient ID: Tonya [name], female    DOB: 02/18/1950, 63 y.o.   MRN: [id number]  Tonya [name] is a pleasant 63 y.o. year old female who presents to clinic today with Leg Swelling  on 07/25/2013  HPI: Tonya [name] for this complaint on 6/9- note reviewed.  H/o acute CHF in 05/2013 s/p aggressive IVFs after acute illness. BNP was elevated to 3880.  Echo showed grade 1 diastolic dysfunction and was given lasix and potassium.   Saw me for follow up on 05/16/13- BNP had improved to 30.  She declined cards referral at the time which I felt was appropriate given resolution of her symptoms.  I advised her to watch for signs of fluid overload.  Saw Regina on 6/9 for peripheral edema and weight gain.  Wt Readings from Last 3 Encounters:  07/25/13 175 lb 8 oz (79.606 kg)  06/12/13 175 lb (79.379 kg)  05/16/13 170 lb 8 oz (77.338 kg)   She advised her to take lasix prn and to use compression hose, again declining cards referral. Lab work was not done.  She is taking lasix every 3 to 4 days.  Not wearing compression hose.  Denies SOB or CP.  No weeping of legs.  Here because her podiatrist told her to follow up with me for this.  Current Outpatient Prescriptions on File Prior to Visit  Medication Sig Dispense Refill  . alendronate (FOSAMAX) 70 MG tablet TAKE 1 TABLET BY MOUTH ONCE WEEKLY  4 tablet  11  . aspirin 81 MG tablet Take 81 mg by mouth daily.      . cholecalciferol (VITAMIN D) 1000 UNITS tablet Take 1,000 Units by mouth daily.        . ferrous sulfate (FERROUSUL) 325 (65 FE) MG tablet Take 1 tablet (325 mg total) by mouth daily with breakfast.  30 tablet  3  . potassium chloride (K-DUR,KLOR-CON) 20 MEQ tablet Take 1 tablet (20 mEq total) by mouth daily.  30 tablet  0   No current facility-administered medications on file prior to visit.    Allergies  Allergen Reactions  . Bactrim [Sulfamethoxazole-Tmp Ds] Hives  . Phenergan [Promethazine Hcl]    Hives  . Sulfa Antibiotics   . Penicillins     REACTION: As a child.    Past Medical History  Diagnosis Date  . Coronary artery disease 07/2003  . Obesity 2003    s/p gastric bypass   . Hypertension   . Diabetes mellitus     Past Surgical History  Procedure Laterality Date  . Gastric bypass  2003  . Cholecystectomy  1999    Family History  Problem Relation Age of Onset  . Heart attack Mother   . Heart attack Father     History   Social History  . Marital Status: Single    Spouse Name: N/A    Number of Children: N/A  . Years of Education: N/A   Occupational History  . Not on file.   Social History Main Topics  . Smoking status: Never Smoker   . Smokeless tobacco: Not on file  . Alcohol Use: No  . Drug Use: No  . Sexual Activity: Not on file   Other Topics Concern  . Not on file   Social History Narrative  . No narrative on file   The PMH, PSH, Social History, Family History, Medications, and allergies have been reviewed in [facility], and have been updated if relevant.  Review of Systems See HPI    Objective:    BP 128/76  Pulse 64  Temp(Src) 98.2 F (36.8 C) (Oral)  Wt 175 lb 8 oz (79.606 kg)  SpO2 97%   Physical Exam  Gen:  Alert, pleasant, NAD Resp:  CTA bilaterally Ext: 1+ pitting edema bilaterally, no venous stasis changes, no weeping      Assessment & Plan:   Bilateral leg edema No Follow-up on file.

## 2013-07-25 NOTE — Assessment & Plan Note (Signed)
>  25 minutes spent in face to face time with patient, >50% spent in counselling or coordination of care She would like to remain conservative with work up and tx. Likely some venous stasis. Will check BNP to rule out acute heart failure. Advised taking lasix (and potassium) for one week, keep legs elevated and wearing compression hose. Call or return to clinic prn if these symptoms worsen or fail to improve as anticipated. The patient indicates understanding of these issues and agrees with the plan.

## 2013-07-25 NOTE — Patient Instructions (Signed)
Good to see you. Try the taking lasix (and potassium) daily for week or so.  I will call you with your lab results.  If you can tolerate compression hose, they will help.

## 2013-07-30 ENCOUNTER — Telehealth: Payer: Self-pay | Admitting: Family Medicine

## 2013-07-30 NOTE — Telephone Encounter (Signed)
Lm on pts vm informing her Rx is available for pickup at the front desk. Copy sent for scanning

## 2013-07-30 NOTE — Telephone Encounter (Signed)
[employer] disability forms filled out same as forms from 2013. In your inbox for signature

## 2013-07-30 NOTE — Telephone Encounter (Signed)
Forms signed and in my box. 

## 2013-10-03 ENCOUNTER — Encounter: Payer: Self-pay | Admitting: Family Medicine

## 2013-10-03 ENCOUNTER — Ambulatory Visit (INDEPENDENT_AMBULATORY_CARE_PROVIDER_SITE_OTHER): Payer: Medicare Other | Admitting: Family Medicine

## 2013-10-03 VITALS — BP 124/66 | HR 56 | Temp 98.1°F | Wt 179.8 lb

## 2013-10-03 DIAGNOSIS — E669 Obesity, unspecified: Secondary | ICD-10-CM

## 2013-10-03 DIAGNOSIS — E66811 Obesity, class 1: Secondary | ICD-10-CM | POA: Insufficient documentation

## 2013-10-03 DIAGNOSIS — R5381 Other malaise: Secondary | ICD-10-CM

## 2013-10-03 DIAGNOSIS — R5383 Other fatigue: Principal | ICD-10-CM

## 2013-10-03 LAB — COMPREHENSIVE METABOLIC PANEL
ALK PHOS: 74 U/L (ref 39–117)
ALT: 30 U/L (ref 0–35)
AST: 22 U/L (ref 0–37)
Albumin: 3.4 g/dL — ABNORMAL LOW (ref 3.5–5.2)
BILIRUBIN TOTAL: 0.3 mg/dL (ref 0.2–1.2)
BUN: 14 mg/dL (ref 6–23)
CO2: 27 mEq/L (ref 19–32)
CREATININE: 0.7 mg/dL (ref 0.4–1.2)
Calcium: 8.8 mg/dL (ref 8.4–10.5)
Chloride: 108 mEq/L (ref 96–112)
GFR: 91.81 mL/min (ref >60.00)
Glucose, Bld: 78 mg/dL (ref 70–99)
Potassium: 3.6 mEq/L (ref 3.5–5.1)
Sodium: 140 mEq/L (ref 135–145)
TOTAL PROTEIN: 6.7 g/dL (ref 6.0–8.3)

## 2013-10-03 LAB — CBC WITH DIFFERENTIAL/PLATELET
BASOS ABS: 0 10*3/uL (ref 0.0–0.1)
Basophils Relative: 0.3 % (ref 0.0–3.0)
Eosinophils Absolute: 0.1 10*3/uL (ref 0.0–0.7)
Eosinophils Relative: 0.8 % (ref 0.0–5.0)
HCT: 36.2 % (ref 36.0–46.0)
Hemoglobin: 11.8 g/dL — ABNORMAL LOW (ref 12.0–15.0)
LYMPHS ABS: 1.3 10*3/uL (ref 0.7–4.0)
LYMPHS PCT: 15.1 % (ref 12.0–46.0)
MCHC: 32.5 g/dL (ref 30.0–36.0)
MCV: 89 fl (ref 78.0–100.0)
Monocytes Absolute: 0.5 10*3/uL (ref 0.1–1.0)
Monocytes Relative: 6.3 % (ref 3.0–12.0)
Neutro Abs: 6.5 10*3/uL (ref 1.4–7.7)
Neutrophils Relative %: 77.5 % — ABNORMAL HIGH (ref 43.0–77.0)
Platelets: 254 10*3/uL (ref 150.0–400.0)
RBC: 4.06 Mil/uL (ref 3.87–5.11)
RDW: 13.9 % (ref 11.5–15.5)
WBC: 8.4 10*3/uL (ref 4.0–10.5)

## 2013-10-03 LAB — VITAMIN D 25 HYDROXY (VIT D DEFICIENCY, FRACTURES): VITD: 29.98 ng/mL — AB (ref 30.00–100.00)

## 2013-10-03 LAB — FERRITIN: FERRITIN: 9.3 ng/mL — AB (ref 10.0–291.0)

## 2013-10-03 LAB — VITAMIN B12: Vitamin B-12: 536 pg/mL (ref 211–911)

## 2013-10-03 MED ORDER — PHENTERMINE HCL 30 MG PO CAPS: 30.0000 mg | ORAL_CAPSULE | ORAL | Status: DC

## 2013-10-03 NOTE — Progress Notes (Signed)
Pre visit review using our clinic review tool, if applicable. No additional management support is needed unless otherwise documented below in the visit note. 

## 2013-10-03 NOTE — Progress Notes (Signed)
Subjective:   Patient ID: Tonya [name], female    DOB: March 21, 1950, 63 y.o.   MRN: [id number]  Tonya [name] is a pleasant 62 y.o. year old female who presents to clinic today with Fatigue  on 10/03/2013  HPI: Fatigue-  Past few weeks feels like she could sleep all day.  Has not been sleeping great at night due to increased stressors- still trying to sell her house. Does have h/o anemia and Vit D deficiency.  Takes Vit D 1000 IU daily.  No blood in stool. Colonoscopy 2010.  Denies any CP or SOB.  Did have h/o CHF in May, felt to be due to IVFs given in hospital when she was admitted with dehydration/cellulitis. Has not had any issues since.  Lab Results  Component Value Date   WBC 7.9 05/06/2013   HGB 11.9* 05/06/2013   HCT 35.0* 05/06/2013   MCV 87.0 05/06/2013   PLT 266 05/06/2013     Weight gain-  Appetite has been "too good."  Wt Readings from Last 3 Encounters:  10/03/13 179 lb 12 oz (81.534 kg)  07/25/13 175 lb 8 oz (79.606 kg)  06/12/13 175 lb (79.379 kg)   Wants to restart phentermine.  Never had any CP, SOB or palpitations while taking it.  Patient Active Problem List   Diagnosis Date Noted  . Other malaise and fatigue 10/03/2013  . Bilateral leg edema 07/25/2013  . Acute diastolic congestive heart failure 05/06/2013  . OSTEOPENIA 05/07/2009  . VITAMIN D DEFICIENCY 11/15/2008  . CORONARY ARTERY DISEASE 11/15/2008  . DIABETES MELLITUS, HX OF 11/15/2008  . HYPERTENSION, HX OF 11/15/2008   Past Medical History  Diagnosis Date  . Coronary artery disease 07/2003  . Obesity 2003    s/p gastric bypass   . Hypertension   . Diabetes mellitus    Past Surgical History  Procedure Laterality Date  . Gastric bypass  2003  . Cholecystectomy  1999   History  Substance Use Topics  . Smoking status: Never Smoker   . Smokeless tobacco: Not on file  . Alcohol Use: No   Family History  Problem Relation Age of Onset  . Heart attack Mother   . Heart attack  Father    Allergies  Allergen Reactions  . Bactrim [Sulfamethoxazole-Tmp Ds] Hives  . Phenergan [Promethazine Hcl]     Hives  . Sulfa Antibiotics   . Penicillins     REACTION: As a child.   Current Outpatient Prescriptions on File Prior to Visit  Medication Sig Dispense Refill  . alendronate (FOSAMAX) 70 MG tablet TAKE 1 TABLET BY MOUTH ONCE WEEKLY  4 tablet  11  . aspirin 81 MG tablet Take 81 mg by mouth daily.      . cholecalciferol (VITAMIN D) 1000 UNITS tablet Take 1,000 Units by mouth daily.        . ferrous sulfate (FERROUSUL) 325 (65 FE) MG tablet Take 1 tablet (325 mg total) by mouth daily with breakfast.  30 tablet  3  . furosemide (LASIX) 20 MG tablet Take 1 tablet (20 mg total) by mouth daily.  30 tablet  3  . potassium chloride SA (K-DUR,KLOR-CON) 20 MEQ tablet TAKE 1 TABLET BY MOUTH EVERY DAY.  30 tablet  2   No current facility-administered medications on file prior to visit.   The PMH, PSH, Social History, Family History, Medications, and allergies have been reviewed in [facility], and have been updated if relevant.    Review of Systems  See HPI +intermittent dizziness when standing No blurred vision No increased LE edema Denies feeling depressed Objective:    BP 124/66  Pulse 56  Temp(Src) 98.1 F (36.7 C) (Oral)  Wt 179 lb 12 oz (81.534 kg)  SpO2 98% Wt Readings from Last 3 Encounters:  10/03/13 179 lb 12 oz (81.534 kg)  07/25/13 175 lb 8 oz (79.606 kg)  06/12/13 175 lb (79.379 kg)     Physical Exam Gen:  Does appear more fatigued today, NAD Resp:  CTA bilaterally CVS:  RRR Ext:  Some nail pitting Thick ankles but non pitting Psych: pleasant today, good eye contact and not anxious or depressed appearing       Assessment & Plan:   Other malaise and fatigue - Plan: CBC with Differential, Ferritin, Comprehensive metabolic panel, Vitamin D, 25-hydroxy, Vitamin B12, CBC with Differential No Follow-up on file.

## 2013-10-03 NOTE — Assessment & Plan Note (Signed)
Deteriorated.  Likely multifactorial- sleep quality has not been good due to increased stressors.  The problem of recurrent insomnia is discussed. Avoidance of caffeine sources is strongly encouraged. Sleep hygiene issues are reviewed.  Will check labs today.  Exam reassuring. Orders Placed This Encounter  Procedures  . CBC with Differential    Standing Status: Future     Number of Occurrences:      Standing Expiration Date: 10/04/2014  . Ferritin  . Comprehensive metabolic panel  . Vitamin D, 25-hydroxy  . Vitamin B12  . CBC with Differential

## 2013-10-03 NOTE — Patient Instructions (Signed)
It was great to see you. Say hi to [facility] for me.  I will call you with your lab results - probably tomorrow.

## 2013-10-03 NOTE — Assessment & Plan Note (Addendum)
Deteriorated. Discussed weight loss plan. t  Pt would also like to restart phentermine- aware of risks and benefits, side effects including HTN, pulmonary HTN, stroke.

## 2013-10-08 MED ORDER — VITAMIN D (ERGOCALCIFEROL) 1.25 MG (50000 UNIT) PO CAPS: 50000.0000 [IU] | ORAL_CAPSULE | ORAL | Status: DC

## 2013-10-08 NOTE — Addendum Note (Signed)
Addended by: Modena Nunnery on: 10/08/2013 02:55 PM   Modules accepted: Orders

## 2013-10-10 ENCOUNTER — Other Ambulatory Visit: Payer: Self-pay | Admitting: Family Medicine

## 2013-10-10 DIAGNOSIS — D509 Iron deficiency anemia, unspecified: Secondary | ICD-10-CM

## 2013-10-10 MED ORDER — FERROUS SULFATE 325 (65 FE) MG PO TABS: 325.0000 mg | ORAL_TABLET | Freq: Every day | ORAL | Status: DC

## 2013-12-12 ENCOUNTER — Emergency Department (HOSPITAL_COMMUNITY)
Admission: EM | Admit: 2013-12-12 | Discharge: 2013-12-13 | Disposition: A | Discharge status: Home / Self Care | Payer: Medicare Other | Attending: Emergency Medicine | Admitting: Emergency Medicine

## 2013-12-12 ENCOUNTER — Encounter (HOSPITAL_COMMUNITY): Payer: Self-pay | Admitting: Emergency Medicine

## 2013-12-12 DIAGNOSIS — I2510 Atherosclerotic heart disease of native coronary artery without angina pectoris: Secondary | ICD-10-CM | POA: Diagnosis not present

## 2013-12-12 DIAGNOSIS — R1031 Right lower quadrant pain: Secondary | ICD-10-CM | POA: Insufficient documentation

## 2013-12-12 DIAGNOSIS — R1032 Left lower quadrant pain: Secondary | ICD-10-CM | POA: Insufficient documentation

## 2013-12-12 DIAGNOSIS — Z880 Allergy status to penicillin: Secondary | ICD-10-CM | POA: Diagnosis not present

## 2013-12-12 DIAGNOSIS — I10 Essential (primary) hypertension: Secondary | ICD-10-CM | POA: Diagnosis not present

## 2013-12-12 DIAGNOSIS — R1030 Lower abdominal pain, unspecified: Secondary | ICD-10-CM

## 2013-12-12 DIAGNOSIS — Z9049 Acquired absence of other specified parts of digestive tract: Secondary | ICD-10-CM | POA: Diagnosis not present

## 2013-12-12 DIAGNOSIS — Z9884 Bariatric surgery status: Secondary | ICD-10-CM | POA: Insufficient documentation

## 2013-12-12 DIAGNOSIS — Z7982 Long term (current) use of aspirin: Secondary | ICD-10-CM | POA: Diagnosis not present

## 2013-12-12 DIAGNOSIS — Z79899 Other long term (current) drug therapy: Secondary | ICD-10-CM | POA: Insufficient documentation

## 2013-12-12 DIAGNOSIS — E119 Type 2 diabetes mellitus without complications: Secondary | ICD-10-CM | POA: Insufficient documentation

## 2013-12-12 LAB — COMPREHENSIVE METABOLIC PANEL
ALK PHOS: 85 U/L (ref 39–117)
ALT: 25 U/L (ref 0–35)
AST: 20 U/L (ref 0–37)
Albumin: 3.5 g/dL (ref 3.5–5.2)
Anion gap: 13 (ref 5–15)
BUN: 14 mg/dL (ref 6–23)
CALCIUM: 9.8 mg/dL (ref 8.4–10.5)
CO2: 24 mEq/L (ref 19–32)
Chloride: 100 mEq/L (ref 96–112)
Creatinine, Ser: 0.54 mg/dL (ref 0.50–1.10)
Glucose, Bld: 134 mg/dL — ABNORMAL HIGH (ref 70–99)
Potassium: 3.7 mEq/L (ref 3.7–5.3)
SODIUM: 137 meq/L (ref 137–147)
Total Bilirubin: 0.2 mg/dL — ABNORMAL LOW (ref 0.3–1.2)
Total Protein: 6.8 g/dL (ref 6.0–8.3)

## 2013-12-12 LAB — CBC WITH DIFFERENTIAL/PLATELET
Basophils Absolute: 0 10*3/uL (ref 0.0–0.1)
Basophils Relative: 0 % (ref 0–1)
Eosinophils Absolute: 0 10*3/uL (ref 0.0–0.7)
Eosinophils Relative: 0 % (ref 0–5)
HEMATOCRIT: 38.8 % (ref 36.0–46.0)
HEMOGLOBIN: 12.6 g/dL (ref 12.0–15.0)
Lymphocytes Relative: 8 % — ABNORMAL LOW (ref 12–46)
Lymphs Abs: 0.8 10*3/uL (ref 0.7–4.0)
MCH: 29 pg (ref 26.0–34.0)
MCHC: 32.5 g/dL (ref 30.0–36.0)
MCV: 89.2 fL (ref 78.0–100.0)
MONO ABS: 0.3 10*3/uL (ref 0.1–1.0)
Monocytes Relative: 3 % (ref 3–12)
Neutro Abs: 9.4 10*3/uL — ABNORMAL HIGH (ref 1.7–7.7)
Neutrophils Relative %: 89 % — ABNORMAL HIGH (ref 43–77)
PLATELETS: 268 10*3/uL (ref 150–400)
RBC: 4.35 MIL/uL (ref 3.87–5.11)
RDW: 12.8 % (ref 11.5–15.5)
WBC: 10.6 10*3/uL — AB (ref 4.0–10.5)

## 2013-12-12 LAB — LIPASE, BLOOD: Lipase: 43 U/L (ref 11–59)

## 2013-12-12 NOTE — ED Notes (Signed)
Patient states about 6 hours ago she developed lower abd pain, described as "intestines knotting" with mild nausea. Patient reports over time the pain has decreased to 4/10 with no nausea. Denies pain radiating anywhere. Denies any constipation, diarrhea, and fever.

## 2013-12-12 NOTE — ED Notes (Addendum)
Pt states that she has been having a "twisting, knotted" pain in mer mid lower ABD since around 1730. Pt states she had a normal BM this morning. Had some nausea earlier, which has resolved. Denies any vomiting, diarrhea, or painful urination. Pty has hx of gastric bypass 2002.

## 2013-12-13 LAB — URINALYSIS, ROUTINE W REFLEX MICROSCOPIC
Bilirubin Urine: NEGATIVE
GLUCOSE, UA: NEGATIVE mg/dL
HGB URINE DIPSTICK: NEGATIVE
KETONES UR: NEGATIVE mg/dL
Leukocytes, UA: NEGATIVE
Nitrite: NEGATIVE
PROTEIN: NEGATIVE mg/dL
Specific Gravity, Urine: 1.026 (ref 1.005–1.030)
Urobilinogen, UA: 0.2 mg/dL (ref 0.0–1.0)
pH: 6 (ref 5.0–8.0)

## 2013-12-13 NOTE — Discharge Instructions (Signed)
Abdominal Pain Many things can cause abdominal pain. Usually, abdominal pain is not caused by a disease and will improve without treatment. It can often be observed and treated at home. Your health care provider will do a physical exam and possibly order blood tests and X-rays to help determine the seriousness of your pain. However, in many cases, more time must pass before a clear cause of the pain can be found. Before that point, your health care provider may not know if you need more testing or further treatment. HOME CARE INSTRUCTIONS  Monitor your abdominal pain for any changes. The following actions may help to alleviate any discomfort you are experiencing:  Only take over-the-counter or prescription medicines as directed by your health care provider.  Do not take laxatives unless directed to do so by your health care provider.  Try a clear liquid diet (broth, tea, or water) as directed by your health care provider. Slowly move to a bland diet as tolerated. SEEK MEDICAL CARE IF:  You have unexplained abdominal pain.  You have abdominal pain associated with nausea or diarrhea.  You have pain when you urinate or have a bowel movement.  You experience abdominal pain that wakes you in the night.  You have abdominal pain that is worsened or improved by eating food.  You have abdominal pain that is worsened with eating fatty foods.  You have a fever. SEEK IMMEDIATE MEDICAL CARE IF:   Your pain does not go away within 2 hours.  You keep throwing up (vomiting).  Your pain is felt only in portions of the abdomen, such as the right side or the left lower portion of the abdomen.  You pass bloody or black tarry stools. MAKE SURE YOU:  Understand these instructions.   Will watch your condition.   Will get help right away if you are not doing well or get worse.  Document Released: 09/30/2004 Document Revised: 12/26/2012 Document Reviewed: 08/30/2012 [facility] Patient Information  2015 [place], [place]. This information is not intended to replace advice given to you by your health care provider. Make sure you discuss any questions you have with your health care provider. Results for orders placed or performed during the hospital encounter of 12/12/13  CBC with Differential  Result Value Ref Range   WBC 10.6 (H) 4.0 - 10.5 K/uL   RBC 4.35 3.87 - 5.11 MIL/uL   Hemoglobin 12.6 12.0 - 15.0 g/dL   HCT 38.8 36.0 - 46.0 %   MCV 89.2 78.0 - 100.0 fL   MCH 29.0 26.0 - 34.0 pg   MCHC 32.5 30.0 - 36.0 g/dL   RDW 12.8 11.5 - 15.5 %   Platelets 268 150 - 400 K/uL   Neutrophils Relative % 89 (H) 43 - 77 %   Neutro Abs 9.4 (H) 1.7 - 7.7 K/uL   Lymphocytes Relative 8 (L) 12 - 46 %   Lymphs Abs 0.8 0.7 - 4.0 K/uL   Monocytes Relative 3 3 - 12 %   Monocytes Absolute 0.3 0.1 - 1.0 K/uL   Eosinophils Relative 0 0 - 5 %   Eosinophils Absolute 0.0 0.0 - 0.7 K/uL   Basophils Relative 0 0 - 1 %   Basophils Absolute 0.0 0.0 - 0.1 K/uL  Comprehensive metabolic panel  Result Value Ref Range   Sodium 137 137 - 147 mEq/L   Potassium 3.7 3.7 - 5.3 mEq/L   Chloride 100 96 - 112 mEq/L   CO2 24 19 - 32 mEq/L  Glucose, Bld 134 (H) 70 - 99 mg/dL   BUN 14 6 - 23 mg/dL   Creatinine, Ser 0.54 0.50 - 1.10 mg/dL   Calcium 9.8 8.4 - 10.5 mg/dL   Total Protein 6.8 6.0 - 8.3 g/dL   Albumin 3.5 3.5 - 5.2 g/dL   AST 20 0 - 37 U/L   ALT 25 0 - 35 U/L   Alkaline Phosphatase 85 39 - 117 U/L   Total Bilirubin 0.2 (L) 0.3 - 1.2 mg/dL   GFR calc non Af Amer >90 >90 mL/min   GFR calc Af Amer >90 >90 mL/min   Anion gap 13 5 - 15  Lipase, blood  Result Value Ref Range   Lipase 43 11 - 59 U/L  Urinalysis, Routine w reflex microscopic  Result Value Ref Range   Color, Urine YELLOW YELLOW   APPearance CLEAR CLEAR   Specific Gravity, Urine 1.026 1.005 - 1.030   pH 6.0 5.0 - 8.0   Glucose, UA NEGATIVE NEGATIVE mg/dL   Hgb urine dipstick NEGATIVE NEGATIVE   Bilirubin Urine NEGATIVE NEGATIVE    Ketones, ur NEGATIVE NEGATIVE mg/dL   Protein, ur NEGATIVE NEGATIVE mg/dL   Urobilinogen, UA 0.2 0.0 - 1.0 mg/dL   Nitrite NEGATIVE NEGATIVE   Leukocytes, UA NEGATIVE NEGATIVE

## 2013-12-13 NOTE — ED Provider Notes (Signed)
CSN: [id number]     Arrival date & time 12/12/13  2111 History   First MD Initiated Contact with Patient 12/13/13 0010     Chief Complaint  Patient presents with  . Abdominal Pain     (Consider location/radiation/quality/duration/timing/severity/associated sxs/prior Treatment) Patient is a 63 y.o. female presenting with abdominal pain. The history is provided by the patient. No language interpreter was used.  Abdominal Pain Pain location:  RLQ, LLQ and suprapubic Pain quality: aching and cramping   Pain radiates to:  Does not radiate Pain severity:  Severe Onset quality:  Gradual Duration:  3 hours Progression:  Improving Chronicity:  New Associated symptoms: no chills, no diarrhea, no dysuria, no fever, no nausea and no vomiting   Associated symptoms comment:  The patient reports a normal day, with normal BM this morning, until around 8:30 pm when she started having lower abdominal pain. No diarrhea, nausea, vomiting, fever, or urinary symptoms. She denies history of similar symptoms. Since arrival she reports her pain is significantly improved, almost completely resolved.    Past Medical History  Diagnosis Date  . Coronary artery disease 07/2003  . Obesity 2003    s/p gastric bypass   . Hypertension   . 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 Gastric bypass  2003  . Cholecystectomy  1999   Family History  Problem Relation Age of Onset  . Heart attack Mother   . Heart attack Father    History  Substance Use Topics  . Smoking status: Never Smoker   . Smokeless tobacco: Never Used  . Alcohol Use: No   OB History    No data available     Review of Systems  Constitutional: Negative for fever and chills.  Respiratory: Negative.   Cardiovascular: Negative.   Gastrointestinal: Positive for abdominal pain. Negative for nausea, vomiting and diarrhea.  Genitourinary: Negative.  Negative for dysuria.   Musculoskeletal: Negative.   Skin: Negative.   Neurological: Negative.       Allergies  Bactrim; Phenergan; Sulfa antibiotics; and Penicillins  Home Medications   Prior to Admission medications   Medication Sig Start Date End Date Taking? Authorizing Provider  alendronate (FOSAMAX) 70 MG tablet TAKE 1 TABLET BY MOUTH ONCE WEEKLY    Lucille Passy, MD  aspirin 81 MG tablet Take 81 mg by mouth daily.    Historical Provider, MD  cholecalciferol (VITAMIN D) 1000 UNITS tablet Take 1,000 Units by mouth daily.      Historical Provider, MD  ferrous sulfate (FERROUSUL) 325 (65 FE) MG tablet Take 1 tablet (325 mg total) by mouth daily with breakfast. 10/10/13   Lucille Passy, MD  furosemide (LASIX) 20 MG tablet Take 1 tablet (20 mg total) by mouth daily. 07/25/13   Lucille Passy, MD  phentermine 30 MG capsule Take 1 capsule (30 mg total) by mouth every morning. 10/03/13   Lucille Passy, MD  potassium chloride SA (K-DUR,KLOR-CON) 20 MEQ tablet TAKE 1 TABLET BY MOUTH EVERY DAY.    Lucille Passy, MD  Vitamin D, Ergocalciferol, (DRISDOL) 50000 UNITS CAPS capsule Take 1 capsule (50,000 Units total) by mouth every 7 (seven) days. 10/08/13   Lucille Passy, MD   BP 140/70 mmHg  Pulse 60  Temp(Src) 97.9 F (36.6 C) (Oral)  Resp 18  Ht 5' (1.524 m)  Wt 174 lb (78.926 kg)  BMI 33.98 kg/m2  SpO2 98%  Physical Exam  Constitutional: She is oriented to person, place, and time. She appears well-developed and well-nourished.  HENT:  Head: Normocephalic.  Neck: Normal range of motion. Neck supple.  Cardiovascular: Normal rate.   Pulmonary/Chest: Effort normal.  Abdominal: Soft. Bowel sounds are normal. There is no tenderness. There is no rebound and no guarding.  Musculoskeletal: Normal range of motion.  Neurological: She is alert and oriented to person, place, and time.  Skin: Skin is warm and dry. No rash noted.  Psychiatric: She has a normal mood and affect.    ED Course  Procedures (including critical  care time) Labs Review Labs Reviewed  CBC WITH DIFFERENTIAL - Abnormal; Notable for the following:    WBC 10.6 (*)    Neutrophils Relative % 89 (*)    Neutro Abs 9.4 (*)    Lymphocytes Relative 8 (*)    All other components within normal limits  COMPREHENSIVE METABOLIC PANEL - Abnormal; Notable for the following:    Glucose, Bld 134 (*)    Total Bilirubin 0.2 (*)    All other components within normal limits  LIPASE, BLOOD  URINALYSIS, ROUTINE W REFLEX MICROSCOPIC   Results for orders placed or performed during the hospital encounter of 12/12/13  CBC with Differential  Result Value Ref Range   WBC 10.6 (H) 4.0 - 10.5 K/uL   RBC 4.35 3.87 - 5.11 MIL/uL   Hemoglobin 12.6 12.0 - 15.0 g/dL   HCT 38.8 36.0 - 46.0 %   MCV 89.2 78.0 - 100.0 fL   MCH 29.0 26.0 - 34.0 pg   MCHC 32.5 30.0 - 36.0 g/dL   RDW 12.8 11.5 - 15.5 %   Platelets 268 150 - 400 K/uL   Neutrophils Relative % 89 (H) 43 - 77 %   Neutro Abs 9.4 (H) 1.7 - 7.7 K/uL   Lymphocytes Relative 8 (L) 12 - 46 %   Lymphs Abs 0.8 0.7 - 4.0 K/uL   Monocytes Relative 3 3 - 12 %   Monocytes Absolute 0.3 0.1 - 1.0 K/uL   Eosinophils Relative 0 0 - 5 %   Eosinophils Absolute 0.0 0.0 - 0.7 K/uL   Basophils Relative 0 0 - 1 %   Basophils Absolute 0.0 0.0 - 0.1 K/uL  Comprehensive metabolic panel  Result Value Ref Range   Sodium 137 137 - 147 mEq/L   Potassium 3.7 3.7 - 5.3 mEq/L   Chloride 100 96 - 112 mEq/L   CO2 24 19 - 32 mEq/L   Glucose, Bld 134 (H) 70 - 99 mg/dL   BUN 14 6 - 23 mg/dL   Creatinine, Ser 0.54 0.50 - 1.10 mg/dL   Calcium 9.8 8.4 - 10.5 mg/dL   Total Protein 6.8 6.0 - 8.3 g/dL   Albumin 3.5 3.5 - 5.2 g/dL   AST 20 0 - 37 U/L   ALT 25 0 - 35 U/L   Alkaline Phosphatase 85 39 - 117 U/L   Total Bilirubin 0.2 (L) 0.3 - 1.2 mg/dL   GFR calc non Af Amer >90 >90 mL/min   GFR calc Af Amer >90 >90 mL/min   Anion gap 13 5 - 15  Lipase, blood  Result Value Ref Range   Lipase 43 11 - 59 U/L  Urinalysis, Routine w  reflex microscopic  Result Value Ref Range   Color, Urine YELLOW YELLOW   APPearance CLEAR CLEAR   Specific Gravity, Urine 1.026 1.005 - 1.030   pH 6.0 5.0 - 8.0  Glucose, UA NEGATIVE NEGATIVE mg/dL   Hgb urine dipstick NEGATIVE NEGATIVE   Bilirubin Urine NEGATIVE NEGATIVE   Ketones, ur NEGATIVE NEGATIVE mg/dL   Protein, ur NEGATIVE NEGATIVE mg/dL   Urobilinogen, UA 0.2 0.0 - 1.0 mg/dL   Nitrite NEGATIVE NEGATIVE   Leukocytes, UA NEGATIVE NEGATIVE     Imaging Review No results found.   EKG Interpretation None      MDM   Final diagnoses:  None    1. Abdominal pain, resolved  She has a benign exam without any abdominal tenderness. Abdominal pain resolved over a 4 hours duration without intervention. Lab studies unremarkable for concerning results. Discussed with Dr. Florina Ou. She is stable for discharge home without imaging. Return precautions provided. Encouraged PCP follow up.    Dewaine Oats, PA-C 12/13/13 0106  Wynetta Fines, MD 12/13/13 [phone]

## 2014-02-11 ENCOUNTER — Encounter: Payer: Self-pay | Admitting: Family Medicine

## 2014-02-11 ENCOUNTER — Ambulatory Visit (INDEPENDENT_AMBULATORY_CARE_PROVIDER_SITE_OTHER): Payer: Medicare Other | Admitting: Family Medicine

## 2014-02-11 VITALS — BP 114/70 | HR 61 | Temp 98.1°F | Wt 175.0 lb

## 2014-02-11 DIAGNOSIS — E669 Obesity, unspecified: Secondary | ICD-10-CM

## 2014-02-11 DIAGNOSIS — M899 Disorder of bone, unspecified: Secondary | ICD-10-CM

## 2014-02-11 DIAGNOSIS — D509 Iron deficiency anemia, unspecified: Secondary | ICD-10-CM

## 2014-02-11 DIAGNOSIS — M949 Disorder of cartilage, unspecified: Secondary | ICD-10-CM

## 2014-02-11 DIAGNOSIS — R600 Localized edema: Secondary | ICD-10-CM

## 2014-02-11 MED ORDER — PHENTERMINE HCL 30 MG PO CAPS: 30.0000 mg | ORAL_CAPSULE | ORAL | Status: DC

## 2014-02-11 MED ORDER — ALENDRONATE SODIUM 70 MG PO TABS
70.0000 mg | ORAL_TABLET | ORAL | Status: DC
Start: 2014-02-11 — End: 2014-02-11

## 2014-02-11 MED ORDER — FERROUS SULFATE 325 (65 FE) MG PO TABS: 325.0000 mg | ORAL_TABLET | Freq: Every day | ORAL | Status: DC

## 2014-02-11 NOTE — Progress Notes (Signed)
Pre visit review using our clinic review tool, if applicable. No additional management support is needed unless otherwise documented below in the visit note. 

## 2014-02-11 NOTE — Assessment & Plan Note (Signed)
Deteriorated- not according to our scales but in comparison to recent weights at home. She is aware of risks and side effects of phentermine including HTN, pulmonary HTN, stroke.    She would like to start phentermine and lifestyle changes.  If BMI < 27 will decrease to half dose x 1 month then stop

## 2014-02-11 NOTE — Patient Instructions (Signed)
Good to see you. Please STOP taking Fosamax.  Tell Juliann Pulse I said hello and would still like to have brunch.

## 2014-02-11 NOTE — Assessment & Plan Note (Signed)
On Fosamax currently. Discussed options with Arbie Cookey.   We agreed to stop Fosamax since she has been taking it for over 5 years. Recheck DEXA in 1 year. Increase physical activity and intake of calcium and Vit D. The patient indicates understanding of these issues and agrees with the plan.

## 2014-02-11 NOTE — Assessment & Plan Note (Signed)
Improving. Using minimal lasix prn.

## 2014-02-11 NOTE — Progress Notes (Signed)
Subjective:   Patient ID: Tonya [name], female    DOB: November 09, 1950, 64 y.o.   MRN: [id number]  Tonya [name] is a pleasant 64 y.o. year old female who presents to clinic today with Follow-up  on 02/11/2014  HPI:  Has been doing well.  She does want to restart phentermine.  She feels she does much better with both her diet and activity level when she takes it. Denies any CP, blurred vision or palpitations. She is aware of risks of taking it.  Last took 30 mg daily dosage and would like that refilled today.  Does need refill on her iron as well. Lab Results  Component Value Date   WBC 10.6* 12/12/2013   HGB 12.6 12/12/2013   HCT 38.8 12/12/2013   MCV 89.2 12/12/2013   PLT 268 12/12/2013   No worsening LE edema.  Only takes lasix on average once a week. No CP or SOB.   osteopenia- takes fosamax weekly and has been for over 5 years.  Last DEXA in our charts was 2011 but she thinks she has had one since.  Current Outpatient Prescriptions on File Prior to Visit  Medication Sig Dispense Refill  . aspirin 81 MG tablet Take 81 mg by mouth daily.    . cholecalciferol (VITAMIN D) 1000 UNITS tablet Take 1,000 Units by mouth daily.      . furosemide (LASIX) 20 MG tablet Take 1 tablet (20 mg total) by mouth daily. 30 tablet 3  . potassium chloride SA (K-DUR,KLOR-CON) 20 MEQ tablet TAKE 1 TABLET BY MOUTH EVERY DAY. 30 tablet 2  . Vitamin D, Ergocalciferol, (DRISDOL) 50000 UNITS CAPS capsule Take 1 capsule (50,000 Units total) by mouth every 7 (seven) days. 6 capsule 0   No current facility-administered medications on file prior to visit.    Allergies  Allergen Reactions  . Bactrim [Sulfamethoxazole-Trimethoprim] Hives  . Phenergan [Promethazine Hcl]     Hives  . Sulfa Antibiotics   . Penicillins     REACTION: As a child.    Past Medical History  Diagnosis Date  . Coronary artery disease 07/2003  . Obesity 2003    s/p gastric bypass   . Hypertension   . Diabetes  mellitus     Resolved after gastric bypass    Past Surgical History  Procedure Laterality Date  . Gastric bypass  2003  . Cholecystectomy  1999    Family History  Problem Relation Age of Onset  . Heart attack Mother   . Heart attack Father     History   Social History  . Marital Status: Single    Spouse Name: N/A    Number of Children: N/A  . Years of Education: N/A   Occupational History  . Not on file.   Social History Main Topics  . Smoking status: Never Smoker   . Smokeless tobacco: Never Used  . Alcohol Use: No  . Drug Use: No  . Sexual Activity: Not on file   Other Topics Concern  . Not on file   Social History Narrative   The PMH, PSH, Social History, Family History, Medications, and allergies have been reviewed in [facility], and have been updated if relevant.   Review of Systems  Constitutional: Negative.   HENT: Negative.   Cardiovascular: Negative.   Gastrointestinal: Negative.   Endocrine: Negative.   Genitourinary: Negative.   Musculoskeletal: Negative.   Skin: Negative.   Allergic/Immunologic: Negative.   Neurological: Negative.   Hematological: Negative.  Psychiatric/Behavioral: Negative.   All other systems reviewed and are negative.      Objective:    BP 114/70 mmHg  Pulse 61  Temp(Src) 98.1 F (36.7 C) (Oral)  Wt 175 lb (79.379 kg)  SpO2 98%  Wt Readings from Last 3 Encounters:  02/11/14 175 lb (79.379 kg)  12/12/13 174 lb (78.926 kg)  10/03/13 179 lb 12 oz (81.534 kg)    Physical Exam        Assessment & Plan:   Iron deficiency anemia - Plan: ferrous sulfate (FERROUSUL) 325 (65 FE) MG tablet No Follow-up on file.

## 2014-04-17 ENCOUNTER — Ambulatory Visit (INDEPENDENT_AMBULATORY_CARE_PROVIDER_SITE_OTHER): Payer: Medicare Other | Admitting: Family Medicine

## 2014-04-17 ENCOUNTER — Encounter: Payer: Self-pay | Admitting: Family Medicine

## 2014-04-17 VITALS — BP 118/66 | HR 58 | Temp 97.8°F | Wt 178.5 lb

## 2014-04-17 DIAGNOSIS — R5383 Other fatigue: Secondary | ICD-10-CM | POA: Diagnosis not present

## 2014-04-17 DIAGNOSIS — Z1211 Encounter for screening for malignant neoplasm of colon: Secondary | ICD-10-CM

## 2014-04-17 LAB — VITAMIN B12: VITAMIN B 12: 469 pg/mL (ref 211–911)

## 2014-04-17 LAB — COMPREHENSIVE METABOLIC PANEL
ALT: 33 U/L (ref 0–35)
AST: 24 U/L (ref 0–37)
Albumin: 3.7 g/dL (ref 3.5–5.2)
Alkaline Phosphatase: 83 U/L (ref 39–117)
BILIRUBIN TOTAL: 0.3 mg/dL (ref 0.2–1.2)
BUN: 17 mg/dL (ref 6–23)
CHLORIDE: 106 meq/L (ref 96–112)
CO2: 27 mEq/L (ref 19–32)
CREATININE: 0.66 mg/dL (ref 0.40–1.20)
Calcium: 9.2 mg/dL (ref 8.4–10.5)
GFR: 96.47 mL/min (ref >60.00)
Glucose, Bld: 107 mg/dL — ABNORMAL HIGH (ref 70–99)
Potassium: 4.2 mEq/L (ref 3.5–5.1)
SODIUM: 139 meq/L (ref 135–145)
TOTAL PROTEIN: 6.6 g/dL (ref 6.0–8.3)

## 2014-04-17 LAB — CBC WITH DIFFERENTIAL/PLATELET
Basophils Absolute: 0 10*3/uL (ref 0.0–0.1)
Basophils Relative: 0.4 % (ref 0.0–3.0)
EOS PCT: 0.8 % (ref 0.0–5.0)
Eosinophils Absolute: 0.1 10*3/uL (ref 0.0–0.7)
HEMATOCRIT: 38.4 % (ref 36.0–46.0)
Hemoglobin: 12.9 g/dL (ref 12.0–15.0)
LYMPHS ABS: 1.4 10*3/uL (ref 0.7–4.0)
Lymphocytes Relative: 17.5 % (ref 12.0–46.0)
MCHC: 33.6 g/dL (ref 30.0–36.0)
MCV: 88.8 fl (ref 78.0–100.0)
Monocytes Absolute: 0.5 10*3/uL (ref 0.1–1.0)
Monocytes Relative: 6.3 % (ref 3.0–12.0)
NEUTROS ABS: 5.9 10*3/uL (ref 1.4–7.7)
Neutrophils Relative %: 75 % (ref 43.0–77.0)
Platelets: 247 10*3/uL (ref 150.0–400.0)
RBC: 4.32 Mil/uL (ref 3.87–5.11)
RDW: 13.2 % (ref 11.5–15.5)
WBC: 7.8 10*3/uL (ref 4.0–10.5)

## 2014-04-17 LAB — TSH: TSH: 3.25 u[IU]/mL (ref 0.35–4.50)

## 2014-04-17 LAB — FERRITIN: FERRITIN: 12 ng/mL (ref 10.0–291.0)

## 2014-04-17 LAB — VITAMIN D 25 HYDROXY (VIT D DEFICIENCY, FRACTURES): VITD: 19.96 ng/mL — AB (ref 30.00–100.00)

## 2014-04-17 NOTE — Assessment & Plan Note (Signed)
New- ?anemia. Check labs, agrees to IFOB for initial work up. Exam reassuring and no red flag symptoms by history. The patient indicates understanding of these issues and agrees with the plan.  Orders Placed This Encounter  Procedures  . Fecal occult blood, imunochemical  . CBC with Differential/Platelet  . TSH  . Vitamin D, 25-hydroxy  . Vitamin B12  . Ferritin  . Comprehensive metabolic panel

## 2014-04-17 NOTE — Patient Instructions (Signed)
Great to see you. Please say hello to [facility] for me. We will call you with your results.

## 2014-04-17 NOTE — Addendum Note (Signed)
Addended by: Ellamae Sia on: 04/17/2014 11:43 AM   Modules accepted: Orders

## 2014-04-17 NOTE — Progress Notes (Signed)
Pre visit review using our clinic review tool, if applicable. No additional management support is needed unless otherwise documented below in the visit note. 

## 2014-04-17 NOTE — Progress Notes (Signed)
Subjective:   Patient ID: Tonya [name], female    DOB: 08/01/1950, 64 y.o.   MRN: [id number]  Tonya [name] is a pleasant 64 y.o. year old female who presents to clinic today with Fatigue  on 04/17/2014  HPI: Past month, has been more tired lately.  History of Vit D deficiency and anemia in past. She has been craving pretzels and ice chips lately.  Denies any LE edema, CP or SOB. No blood in stool (overdue for colonoscopy) or changes in bowel habits.  Sleeping well.  Denies feeling depressed or anxious. Lab Results  Component Value Date   WBC 10.6* 12/12/2013   HGB 12.6 12/12/2013   HCT 38.8 12/12/2013   MCV 89.2 12/12/2013   PLT 268 12/12/2013   Current Outpatient Prescriptions on File Prior to Visit  Medication Sig Dispense Refill  . aspirin 81 MG tablet Take 81 mg by mouth daily.    . cholecalciferol (VITAMIN D) 1000 UNITS tablet Take 1,000 Units by mouth daily.      . ferrous sulfate (FERROUSUL) 325 (65 FE) MG tablet Take 1 tablet (325 mg total) by mouth daily with breakfast. 30 tablet 6  . furosemide (LASIX) 20 MG tablet Take 1 tablet (20 mg total) by mouth daily. 30 tablet 3  . potassium chloride SA (K-DUR,KLOR-CON) 20 MEQ tablet TAKE 1 TABLET BY MOUTH EVERY DAY. 30 tablet 2  . phentermine 30 MG capsule Take 1 capsule (30 mg total) by mouth every morning. (Patient not taking: Reported on 04/17/2014) 30 capsule 2   No current facility-administered medications on file prior to visit.    Allergies  Allergen Reactions  . Bactrim [Sulfamethoxazole-Trimethoprim] Hives  . Phenergan [Promethazine Hcl]     Hives  . Sulfa Antibiotics   . Penicillins     REACTION: As a child.    Past Medical History  Diagnosis Date  . Coronary artery disease 07/2003  . Obesity 2003    s/p gastric bypass   . Hypertension   . 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 Gastric bypass  2003  . Cholecystectomy   1999    Family History  Problem Relation Age of Onset  . Heart attack Mother   . Heart attack Father     History   Social History  . Marital Status: Single    Spouse Name: N/A  . Number of Children: N/A  . Years of Education: N/A   Occupational History  . Not on file.   Social History Main Topics  . Smoking status: Never Smoker   . Smokeless tobacco: Never Used  . Alcohol Use: No  . Drug Use: No  . Sexual Activity: Not on file   Other Topics Concern  . Not on file   Social History Narrative   The PMH, PSH, Social History, Family History, Medications, and allergies have been reviewed in [facility], and have been updated if relevant.   Review of Systems  Constitutional: Positive for fatigue. Negative for fever and unexpected weight change.  HENT: Negative.   Eyes: Negative.   Respiratory: Negative.   Cardiovascular: Negative.   Gastrointestinal: Negative.   Endocrine: Negative.   Genitourinary: Negative.   Musculoskeletal: Negative.   Skin: Negative.   Allergic/Immunologic: Negative.   Neurological: Negative.   Hematological: Negative.   Psychiatric/Behavioral: Negative.   All other systems reviewed and are negative.      Objective:  BP 118/66 mmHg  Pulse 58  Temp(Src) 97.8 F (36.6 C) (Oral)  Wt 178 lb 8 oz (80.967 kg)  SpO2 99% Wt Readings from Last 3 Encounters:  04/17/14 178 lb 8 oz (80.967 kg)  02/11/14 175 lb (79.379 kg)  12/12/13 174 lb (78.926 kg)     Physical Exam  Constitutional: She is oriented to person, place, and time. She appears well-developed and well-nourished. No distress.  HENT:  Head: Normocephalic and atraumatic.  Eyes: Conjunctivae are normal.  Neck: Normal range of motion.  Cardiovascular: Normal rate and regular rhythm.   Pulmonary/Chest: Effort normal and breath sounds normal. No respiratory distress.  Abdominal: Soft.  Musculoskeletal: Normal range of motion. She exhibits no edema.  Neurological: She is alert and  oriented to person, place, and time. No cranial nerve deficit.  Skin: Skin is warm and dry.  Psychiatric: She has a normal mood and affect. Her behavior is normal. Judgment and thought content normal.  Nursing note and vitals reviewed.         Assessment & Plan:   Other fatigue - Plan: CBC with Differential/Platelet, TSH, Vitamin D, 25-hydroxy, Vitamin B12, Ferritin, Comprehensive metabolic panel  Special screening for malignant neoplasms, colon - Plan: Fecal occult blood, imunochemical No Follow-up on file.

## 2014-04-19 MED ORDER — VITAMIN D (ERGOCALCIFEROL) 1.25 MG (50000 UNIT) PO CAPS: 50000.0000 [IU] | ORAL_CAPSULE | ORAL | Status: DC

## 2014-04-19 NOTE — Addendum Note (Signed)
Addended by: Modena Nunnery on: 04/19/2014 03:34 PM   Modules accepted: Orders

## 2014-09-10 ENCOUNTER — Encounter: Payer: Self-pay | Admitting: Family Medicine

## 2014-09-10 ENCOUNTER — Ambulatory Visit (INDEPENDENT_AMBULATORY_CARE_PROVIDER_SITE_OTHER): Payer: Medicare Other | Admitting: Family Medicine

## 2014-09-10 ENCOUNTER — Telehealth: Payer: Self-pay | Admitting: Internal Medicine

## 2014-09-10 VITALS — BP 136/74 | HR 77 | Temp 98.4°F | Wt 183.2 lb

## 2014-09-10 DIAGNOSIS — K921 Melena: Secondary | ICD-10-CM | POA: Insufficient documentation

## 2014-09-10 MED ORDER — PHENTERMINE HCL 30 MG PO CAPS: 30.0000 mg | ORAL_CAPSULE | ORAL | Status: DC

## 2014-09-10 NOTE — Patient Instructions (Signed)
Great to see you. Please stop by to see Marion or Linda on your way out. 

## 2014-09-10 NOTE — Assessment & Plan Note (Signed)
New- guiac pos in office. No fissure or external hemorrhoid on exam. Refer to GI for further work up/colonoscopy. The patient indicates understanding of these issues and agrees with the plan.

## 2014-09-10 NOTE — Progress Notes (Signed)
Pre visit review using our clinic review tool, if applicable. No additional management support is needed unless otherwise documented below in the visit note. 

## 2014-09-10 NOTE — Progress Notes (Signed)
Subjective:   Patient ID: Tonya [name], female    DOB: [birth year], 64 y.o.   MRN: [id number]  Tonya [name] is a pleasant 64 y.o. year old female who presents to clinic today with Blood In Stools  on 09/10/2014  HPI:  Blood in stool- past couple of weeks, bright red blood on toilet paper and in water with BM.  Also noticed it when she passed gas on the toilet- saw it in the water.  No mucous.  No pain with BM.  No changes in bowel habits.  Has not been constipated.  Typically has 6 or more BMs per day since her gastric bypass.  Overdue for colonoscopy.  Denies any abdominal pain.  No fevers.  No weight loss.  No night sweats.  No black stools.  Wt Readings from Last 3 Encounters:  09/10/14 183 lb 4 oz (83.122 kg)  04/17/14 178 lb 8 oz (80.967 kg)  02/11/14 175 lb (79.379 kg)   Current Outpatient Prescriptions on File Prior to Visit  Medication Sig Dispense Refill  . aspirin 81 MG tablet Take 81 mg by mouth daily.    . cholecalciferol (VITAMIN D) 1000 UNITS tablet Take 1,000 Units by mouth daily.      . ferrous sulfate (FERROUSUL) 325 (65 FE) MG tablet Take 1 tablet (325 mg total) by mouth daily with breakfast. 30 tablet 6  . furosemide (LASIX) 20 MG tablet Take 1 tablet (20 mg total) by mouth daily. 30 tablet 3  . potassium chloride SA (K-DUR,KLOR-CON) 20 MEQ tablet TAKE 1 TABLET BY MOUTH EVERY DAY. 30 tablet 2   No current facility-administered medications on file prior to visit.    Allergies  Allergen Reactions  . Bactrim [Sulfamethoxazole-Trimethoprim] Hives  . Phenergan [Promethazine Hcl]     Hives  . Sulfa Antibiotics   . Penicillins     REACTION: As a child.    Past Medical History  Diagnosis Date  . Coronary artery disease 07/2003  . Obesity 2003    s/p gastric bypass   . Hypertension   . 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 Gastric bypass  2003  . Cholecystectomy  1999     Family History  Problem Relation Age of Onset  . Heart attack Mother   . Heart attack Father     Social History   Social History  . Marital Status: Single    Spouse Name: N/A  . Number of Children: N/A  . Years of Education: N/A   Occupational History  . Not on file.   Social History Main Topics  . Smoking status: Never Smoker   . Smokeless tobacco: Never Used  . Alcohol Use: No  . Drug Use: No  . Sexual Activity: Not on file   Other Topics Concern  . Not on file   Social History Narrative   The PMH, PSH, Social History, Family History, Medications, and allergies have been reviewed in [facility], and have been updated if relevant.   Review of Systems  Constitutional: Negative.   Gastrointestinal: Positive for blood in stool and anal bleeding. Negative for nausea, vomiting, abdominal pain, diarrhea, constipation, abdominal distention and rectal pain.  All other systems reviewed and are negative.      Objective:    BP 136/74 mmHg  Pulse 77  Temp(Src) 98.4 F (36.9 C) (Oral)  Wt 183 lb 4 oz (83.122 kg)  SpO2 99%  Physical Exam  Constitutional: She is oriented to person, place, and time. She appears well-developed and well-nourished. No distress.  HENT:  Head: Normocephalic.  Eyes: Conjunctivae are normal.  Cardiovascular: Normal rate.   Pulmonary/Chest: Effort normal.  Genitourinary: Rectal exam shows no external hemorrhoid, no fissure, no mass, no tenderness and anal tone normal. Guaiac positive stool.  Neurological: She is alert and oriented to person, place, and time. No cranial nerve deficit.  Skin: Skin is warm and dry.  Psychiatric: She has a normal mood and affect. Her behavior is normal. Thought content normal.          Assessment & Plan:   Blood in stool No Follow-up on file.

## 2014-09-10 NOTE — Telephone Encounter (Signed)
Left message for pt to call back.  Pt states she is having rectal bleeding and her PCP stated that she needed to have a colon done asap. Pt scheduled to see Tye Savoy NP tomorrow at 2:30pm. Pt aware of appt.

## 2014-09-11 ENCOUNTER — Encounter: Payer: Self-pay | Admitting: Nurse Practitioner

## 2014-09-11 ENCOUNTER — Ambulatory Visit (INDEPENDENT_AMBULATORY_CARE_PROVIDER_SITE_OTHER): Payer: Medicare Other | Admitting: Nurse Practitioner

## 2014-09-11 VITALS — BP 134/84 | HR 76 | Ht 60.5 in | Wt 184.2 lb

## 2014-09-11 DIAGNOSIS — E669 Obesity, unspecified: Secondary | ICD-10-CM | POA: Diagnosis not present

## 2014-09-11 DIAGNOSIS — K625 Hemorrhage of anus and rectum: Secondary | ICD-10-CM | POA: Insufficient documentation

## 2014-09-11 MED ORDER — DICYCLOMINE HCL 10 MG PO CAPS: ORAL_CAPSULE | ORAL | Status: DC

## 2014-09-11 NOTE — Progress Notes (Signed)
HPI :  Patient is a 64 year old female known remotely to Dr. Henrene Pastor. She had a colonoscopy and upper endoscopy in 2005 for evaluation of iron deficiency anemia. Upper endoscopy was normal. Of note patient is status post remote bariatric surgery. Colonoscopy revealed diverticulosis and a small rectal polyp. Unable to locate pathology report.  Patient is referred by PCP for evaluation of painless rectal bleeding. Patient describes intermittent scant bright red blood with bowel movements over last two weeks.  No constipation nor diarrhea.  No abdominal pain or unusual weight loss.  Patient actually feels quite well.  Past Medical History  Diagnosis Date  . Coronary artery disease 07/2003  . Obesity 2003    s/p gastric bypass   . Hypertension   . Diabetes mellitus     Resolved after gastric bypass    Family History  Problem Relation Age of Onset  . Heart attack Mother   . Heart attack Father   . Throat cancer Mother   . Uterine cancer Sister   . Clotting disorder Mother   . Liver disease Mother   . Kidney disease Mother    Social History  Substance Use Topics  . Smoking status: Never Smoker   . Smokeless tobacco: Never Used  . Alcohol Use: No   Current Outpatient Prescriptions  Medication Sig Dispense Refill  . aspirin 81 MG tablet Take 81 mg by mouth daily.    . cholecalciferol (VITAMIN D) 1000 UNITS tablet Take 1,000 Units by mouth daily.      . ferrous sulfate (FERROUSUL) 325 (65 FE) MG tablet Take 1 tablet (325 mg total) by mouth daily with breakfast. 30 tablet 6  . Multiple Vitamin (MULTIVITAMIN) tablet Take 1 tablet by mouth daily.    . phentermine 30 MG capsule Take 1 capsule (30 mg total) by mouth every morning. 30 capsule 2   No current facility-administered medications for this visit.   Allergies  Allergen Reactions  . Bactrim [Sulfamethoxazole-Trimethoprim] Hives  . Phenergan [Promethazine Hcl]     Hives  . Sulfa Antibiotics   . Penicillins     REACTION: As  a child.     Review of Systems: All systems reviewed and negative except where noted in HPI.   Physical Exam: BP 134/84 mmHg  Pulse 76  Ht 5' 0.5" (1.537 m)  Wt 184 lb 4 oz (83.575 kg)  BMI 35.38 kg/m2 Constitutional: Pleasant, obese white female in no acute distress. HEENT: Normocephalic and atraumatic. Conjunctivae are normal. No scleral icterus. Neck supple.  Cardiovascular: Normal rate, regular rhythm.  Pulmonary/chest: Effort normal and breath sounds normal. No wheezing, rales or rhonchi. Abdominal: Soft, obese, nontender. Bowel sounds active throughout. There are no obvious masses. Rectal / Anoscopy: Tiny internal hemorrhoids. No external lesions. Extremities: no edema Lymphadenopathy: No cervical adenopathy noted. Neurological: Alert and oriented to person place and time. Skin: Skin is warm and dry. No rashes noted. Psychiatric: Normal mood and affect. Behavior is normal.   ASSESSMENT AND PLAN:   1.  Pleasant 64 year old female with two-week history of painless rectal bleeding with bowel movements. Bleeding is scant, intermittent.  Very small internal hemorrhoids on anoscopy.  Suspect bleeding secondary to internal hemorrhoids but colon neoplasm./ other etiologies cannot be excluded. For further evaluation patient will be scheduled for colonoscopy. The risks, benefits, and alternatives to colonoscopy with possible biopsy and possible polypectomy were discussed with the patient and she consents to proceed.  Patient will need to be off phentermine 10 days prior to colonoscopy.  We discussed treatment of internal hemorrhoids. For now we both agreed to hold off given the Since hemorrhoids are so small will hold off on treatment  2.  Morbid obesity. Patient is status post remote bariatric surgery.  She has gained a few pounds and was prescribed phentermine yesterday by PCP.    Cc: Arnette Norris, MD

## 2014-09-11 NOTE — Patient Instructions (Addendum)
Hold phentermine 10 days prior to 11-21-2014.  You have been scheduled for a colonoscopy. Please follow written instructions given to you at your visit today.  Please pick up your prep supplies at the pharmacy within the next 1-3 days. If you use inhalers (even only as needed), please bring them with you on the day of your procedure.

## 2014-09-12 ENCOUNTER — Encounter: Payer: Self-pay | Admitting: Nurse Practitioner

## 2014-09-23 ENCOUNTER — Encounter: Payer: Self-pay | Admitting: Internal Medicine

## 2014-09-23 ENCOUNTER — Telehealth: Payer: Self-pay

## 2014-09-23 LAB — HM MAMMOGRAPHY

## 2014-09-23 NOTE — Telephone Encounter (Signed)
Patient declined any information in regards to scheduling a mammogram at the moment.

## 2014-10-05 NOTE — Progress Notes (Signed)
Agree with Ms. Guenther's assessment and plan. Rodrigo Mcgranahan E. Sherril Shipman, MD, FACG   

## 2014-11-21 ENCOUNTER — Encounter: Payer: Medicare Other | Admitting: Internal Medicine

## 2015-04-07 ENCOUNTER — Telehealth: Payer: Self-pay | Admitting: Family Medicine

## 2015-04-07 MED ORDER — OSELTAMIVIR PHOSPHATE 75 MG PO CAPS: 75.0000 mg | ORAL_CAPSULE | Freq: Two times a day (BID) | ORAL | Status: DC

## 2015-04-07 NOTE — Telephone Encounter (Signed)
Pt was exposed to the flu and starting to have symptoms.  eRx sent for Tamiflu.

## 2015-06-18 ENCOUNTER — Encounter: Payer: Self-pay | Admitting: Family Medicine

## 2015-06-18 ENCOUNTER — Ambulatory Visit (INDEPENDENT_AMBULATORY_CARE_PROVIDER_SITE_OTHER): Payer: Medicare Other | Admitting: Family Medicine

## 2015-06-18 VITALS — BP 130/70 | HR 70 | Temp 98.1°F | Wt 179.5 lb

## 2015-06-18 DIAGNOSIS — E559 Vitamin D deficiency, unspecified: Secondary | ICD-10-CM | POA: Diagnosis not present

## 2015-06-18 DIAGNOSIS — Z862 Personal history of diseases of the blood and blood-forming organs and certain disorders involving the immune mechanism: Secondary | ICD-10-CM | POA: Diagnosis not present

## 2015-06-18 DIAGNOSIS — E669 Obesity, unspecified: Secondary | ICD-10-CM

## 2015-06-18 LAB — CBC WITH DIFFERENTIAL/PLATELET
BASOS ABS: 0 10*3/uL (ref 0.0–0.1)
Basophils Relative: 0.2 % (ref 0.0–3.0)
EOS ABS: 0.1 10*3/uL (ref 0.0–0.7)
Eosinophils Relative: 0.7 % (ref 0.0–5.0)
HCT: 35.1 % — ABNORMAL LOW (ref 36.0–46.0)
Hemoglobin: 11.4 g/dL — ABNORMAL LOW (ref 12.0–15.0)
Lymphocytes Relative: 12.8 % (ref 12.0–46.0)
Lymphs Abs: 1.1 10*3/uL (ref 0.7–4.0)
MCHC: 32.7 g/dL (ref 30.0–36.0)
MCV: 89.4 fl (ref 78.0–100.0)
MONOS PCT: 6.1 % (ref 3.0–12.0)
Monocytes Absolute: 0.5 10*3/uL (ref 0.1–1.0)
Neutro Abs: 7 10*3/uL (ref 1.4–7.7)
Neutrophils Relative %: 80.2 % — ABNORMAL HIGH (ref 43.0–77.0)
Platelets: 272 10*3/uL (ref 150.0–400.0)
RBC: 3.93 Mil/uL (ref 3.87–5.11)
RDW: 14.4 % (ref 11.5–15.5)
WBC: 8.8 10*3/uL (ref 4.0–10.5)

## 2015-06-18 LAB — VITAMIN D 25 HYDROXY (VIT D DEFICIENCY, FRACTURES): VITD: 21.35 ng/mL — ABNORMAL LOW (ref 30.00–100.00)

## 2015-06-18 MED ORDER — PHENTERMINE HCL 37.5 MG PO CAPS: 37.5000 mg | ORAL_CAPSULE | ORAL | Status: DC

## 2015-06-18 NOTE — Patient Instructions (Signed)
Great to see you. Please tell Juliann Pulse I said congratulations!  We will call you with your results.

## 2015-06-18 NOTE — Progress Notes (Signed)
Subjective:   Patient ID: Tonya [name], female    DOB: [birth year], 65 y.o.   MRN: [id number]  Tonya [name] is a pleasant 65 y.o. year old female who presents to clinic today with Weight Loss  on 06/18/2015  HPI: Obesity- was doing well on phentermine.  Has not had it in months and feels she is gaining weight.  No CP, palpitations, HA or SOB. No difficulty falling asleep. Did curb her appetite.  H/o anemia due to hemorrhoids (last saw GI 09/2014- note reviewed).  Wants CBC recheck today.  Denies fatigue.  Current Outpatient Prescriptions on File Prior to Visit  Medication Sig Dispense Refill  . aspirin 81 MG tablet Take 81 mg by mouth daily.    . cholecalciferol (VITAMIN D) 1000 UNITS tablet Take 1,000 Units by mouth daily.      Marland Kitchen dicyclomine (BENTYL) 10 MG capsule Take 1 tab twice daily as needed for cramps. 30 capsule 1  . ferrous sulfate (FERROUSUL) 325 (65 FE) MG tablet Take 1 tablet (325 mg total) by mouth daily with breakfast. 30 tablet 6  . Multiple Vitamin (MULTIVITAMIN) tablet Take 1 tablet by mouth daily.     No current facility-administered medications on file prior to visit.    Allergies  Allergen Reactions  . Bactrim [Sulfamethoxazole-Trimethoprim] Hives  . Phenergan [Promethazine Hcl]     Hives  . Sulfa Antibiotics   . Penicillins     REACTION: As a child.    Past Medical History  Diagnosis Date  . Coronary artery disease 07/2003  . Obesity 2003    s/p gastric bypass   . Hypertension   . 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 Gastric bypass  2003  . Cholecystectomy  1999    Family History  Problem Relation Age of Onset  . Heart attack Mother   . Heart attack Father   . Throat cancer Mother   . Uterine cancer Sister   . Clotting disorder Mother   . Liver disease Mother   . Kidney disease Mother     Social History   Social History  . Marital Status:  Single    Spouse Name: N/A  . Number of Children: 0  . Years of Education: N/A   Occupational History  . retired    Social History Main Topics  . Smoking status: Never Smoker   . Smokeless tobacco: Never Used  . Alcohol Use: No  . Drug Use: No  . Sexual Activity: Not on file   Other Topics Concern  . Not on file   Social History Narrative   The PMH, PSH, Social History, Family History, Medications, and allergies have been reviewed in [facility], and have been updated if relevant.   Review of Systems  Constitutional: Negative for fatigue.  HENT: Negative.   Gastrointestinal: Positive for blood in stool.  Endocrine: Negative.   Musculoskeletal: Negative.   Psychiatric/Behavioral: Negative.   All other systems reviewed and are negative.      Objective:    BP 130/70 mmHg  Pulse 70  Temp(Src) 98.1 F (36.7 C) (Oral)  Wt 179 lb 8 oz (81.421 kg)  SpO2 97%  Wt Readings from Last 3 Encounters:  06/18/15 179 lb 8 oz (81.421 kg)  09/11/14 184 lb 4 oz (83.575 kg)  09/10/14 183 lb 4 oz (83.122 kg)     Physical Exam  Constitutional: She is oriented to  person, place, and time. She appears well-developed and well-nourished. No distress.  HENT:  Head: Normocephalic.  Eyes: Conjunctivae are normal.  Cardiovascular: Normal rate and regular rhythm.   Musculoskeletal: Normal range of motion.  Neurological: She is alert and oriented to person, place, and time. No cranial nerve deficit.  Skin: Skin is warm and dry. She is not diaphoretic.  Psychiatric: She has a normal mood and affect. Her behavior is normal. Judgment and thought content normal.  Nursing note and vitals reviewed.         Assessment & Plan:   History of anemia - Plan: CBC with Differential/Platelet  Vitamin D deficiency - Plan: Vitamin D, 25-hydroxy  Obesity No Follow-up on file.

## 2015-06-18 NOTE — Progress Notes (Signed)
Pre visit review using our clinic review tool, if applicable. No additional management support is needed unless otherwise documented below in the visit note. 

## 2015-06-18 NOTE — Assessment & Plan Note (Signed)
Deteriorated but not according to our scales.  Discussed weight loss plan.  She is aware of phentermine risk benefits, side effects including HTN, pulmonary HTN, stroke.    She would like to restart phentermine and lifestyle changes.  Follow up in 1 month.  If BMI < 27 will decrease to half dose x 1 month then stop

## 2015-06-18 NOTE — Assessment & Plan Note (Signed)
Continue current dose of iron. Recheck CBC today.

## 2015-06-24 ENCOUNTER — Telehealth: Payer: Self-pay

## 2015-06-24 NOTE — Telephone Encounter (Signed)
Tonya [name] [phone]  Mikki dropped off a disability Parking Placard to be filled out by the doctor, please mail back to her when complete. Placed ion RX box up front

## 2015-06-26 NOTE — Telephone Encounter (Signed)
Placard mailed at pts request

## 2015-06-30 ENCOUNTER — Encounter: Payer: Self-pay | Admitting: *Deleted

## 2015-06-30 MED ORDER — VITAMIN D (ERGOCALCIFEROL) 1.25 MG (50000 UNIT) PO CAPS: 50000.0000 [IU] | ORAL_CAPSULE | ORAL | Status: DC

## 2015-06-30 NOTE — Addendum Note (Signed)
Addended by: Modena Nunnery on: 06/30/2015 04:37 PM   Modules accepted: Orders

## 2015-12-22 ENCOUNTER — Ambulatory Visit (INDEPENDENT_AMBULATORY_CARE_PROVIDER_SITE_OTHER): Payer: Medicare Other | Admitting: Family Medicine

## 2015-12-22 ENCOUNTER — Encounter: Payer: Self-pay | Admitting: Family Medicine

## 2015-12-22 VITALS — BP 116/62 | HR 66 | Temp 98.2°F | Wt 166.8 lb

## 2015-12-22 DIAGNOSIS — Z862 Personal history of diseases of the blood and blood-forming organs and certain disorders involving the immune mechanism: Secondary | ICD-10-CM

## 2015-12-22 DIAGNOSIS — E559 Vitamin D deficiency, unspecified: Secondary | ICD-10-CM

## 2015-12-22 DIAGNOSIS — R5383 Other fatigue: Secondary | ICD-10-CM | POA: Diagnosis not present

## 2015-12-22 LAB — CBC WITH DIFFERENTIAL/PLATELET
BASOS ABS: 0 10*3/uL (ref 0.0–0.1)
Basophils Relative: 0.3 % (ref 0.0–3.0)
EOS PCT: 1 % (ref 0.0–5.0)
Eosinophils Absolute: 0.1 10*3/uL (ref 0.0–0.7)
HEMATOCRIT: 37.1 % (ref 36.0–46.0)
Hemoglobin: 12.2 g/dL (ref 12.0–15.0)
LYMPHS PCT: 15.9 % (ref 12.0–46.0)
Lymphs Abs: 1.4 10*3/uL (ref 0.7–4.0)
MCHC: 32.9 g/dL (ref 30.0–36.0)
MCV: 89.6 fl (ref 78.0–100.0)
MONOS PCT: 6.4 % (ref 3.0–12.0)
Monocytes Absolute: 0.6 10*3/uL (ref 0.1–1.0)
NEUTROS ABS: 6.8 10*3/uL (ref 1.4–7.7)
NEUTROS PCT: 76.4 % (ref 43.0–77.0)
PLATELETS: 292 10*3/uL (ref 150.0–400.0)
RBC: 4.15 Mil/uL (ref 3.87–5.11)
RDW: 13.2 % (ref 11.5–15.5)
WBC: 8.9 10*3/uL (ref 4.0–10.5)

## 2015-12-22 LAB — COMPREHENSIVE METABOLIC PANEL
ALBUMIN: 3.6 g/dL (ref 3.5–5.2)
ALT: 28 U/L (ref 0–35)
AST: 24 U/L (ref 0–37)
Alkaline Phosphatase: 93 U/L (ref 39–117)
BUN: 12 mg/dL (ref 6–23)
CALCIUM: 8.9 mg/dL (ref 8.4–10.5)
CO2: 26 mEq/L (ref 19–32)
Chloride: 108 mEq/L (ref 96–112)
Creatinine, Ser: 0.53 mg/dL (ref 0.40–1.20)
GFR: 123.59 mL/min (ref >60.00)
Glucose, Bld: 135 mg/dL — ABNORMAL HIGH (ref 70–99)
Potassium: 4 mEq/L (ref 3.5–5.1)
Sodium: 141 mEq/L (ref 135–145)
TOTAL PROTEIN: 6.1 g/dL (ref 6.0–8.3)
Total Bilirubin: 0.3 mg/dL (ref 0.2–1.2)

## 2015-12-22 LAB — FERRITIN: FERRITIN: 23.5 ng/mL (ref 10.0–291.0)

## 2015-12-22 LAB — TSH: TSH: 1.57 u[IU]/mL (ref 0.35–4.50)

## 2015-12-22 LAB — VITAMIN B12: Vitamin B-12: 654 pg/mL (ref 211–911)

## 2015-12-22 LAB — VITAMIN D 25 HYDROXY (VIT D DEFICIENCY, FRACTURES): VITD: 23.23 ng/mL — ABNORMAL LOW (ref 30.00–100.00)

## 2015-12-22 NOTE — Patient Instructions (Addendum)
Great to see you. Let's get some blood work today.  Happy Holidays!

## 2015-12-22 NOTE — Progress Notes (Signed)
Subjective:   Patient ID: Tonya [name], female    DOB: 10-11-1950, 65 y.o.   MRN: [id number]  Tonya [name] is a pleasant 65 y.o. year old female who presents to clinic today with Follow-up (fatigue. wanting iron labs)  on 12/22/2015  HPI:   H/o anemia due to hemorrhoids (last saw GI 09/2014- note reviewed). Currently taking iron 325 mg twice daily. Has been having more fatigue lately.   History of  Vitamin d deficiency as well. Vit D was 21 in 06/2015.  Was repleted high dose, now taking 6000 IU daily.  No blood in stool, or at least not any more than usual when her hemorrhoids act up.  She just feels "drained of energy." Sleeping well.  No CP or SOB.  Lab Results  Component Value Date   WBC 8.8 06/18/2015   HGB 11.4 (L) 06/18/2015   HCT 35.1 (L) 06/18/2015   MCV 89.4 06/18/2015   PLT 272.0 06/18/2015      Current Outpatient Prescriptions on File Prior to Visit  Medication Sig Dispense Refill  . aspirin 81 MG tablet Take 81 mg by mouth daily.    . cholecalciferol (VITAMIN D) 1000 UNITS tablet Take 1,000 Units by mouth daily.      Marland Kitchen dicyclomine (BENTYL) 10 MG capsule Take 1 tab twice daily as needed for cramps. 30 capsule 1  . ferrous sulfate (FERROUSUL) 325 (65 FE) MG tablet Take 1 tablet (325 mg total) by mouth daily with breakfast. 30 tablet 6  . Multiple Vitamin (MULTIVITAMIN) tablet Take 1 tablet by mouth daily.     No current facility-administered medications on file prior to visit.     Allergies  Allergen Reactions  . Bactrim [Sulfamethoxazole-Trimethoprim] Hives  . Phenergan [Promethazine Hcl]     Hives  . Sulfa Antibiotics   . Penicillins     REACTION: As a child.    Past Medical History:  Diagnosis Date  . Coronary artery disease 07/2003  . Diabetes mellitus    Resolved after gastric bypass  . Hypertension   . Obesity 2003   s/p gastric bypass     Past Surgical History:  Procedure Laterality Date  . CHOLECYSTECTOMY  1999  .  GASTRIC BYPASS  2003    Family History  Problem Relation Age of Onset  . Heart attack Mother   . Heart attack Father   . Throat cancer Mother   . Uterine cancer Sister   . Clotting disorder Mother   . Liver disease Mother   . Kidney disease Mother     Social History   Social History  . Marital status: Single    Spouse name: N/A  . Number of children: 0  . Years of education: N/A   Occupational History  . retired    Social History Main Topics  . Smoking status: Never Smoker  . Smokeless tobacco: Never Used  . Alcohol use No  . Drug use: No  . Sexual activity: Not on file   Other Topics Concern  . Not on file   Social History Narrative  . No narrative on file   The PMH, PSH, Social History, Family History, Medications, and allergies have been reviewed in [facility], and have been updated if relevant.   Review of Systems  Constitutional: Positive for fatigue. Negative for appetite change and fever.  HENT: Negative.   Respiratory: Negative.   Cardiovascular: Negative.   Endocrine: Negative.   Genitourinary: Negative.   Musculoskeletal: Negative.   Allergic/Immunologic:  Negative.   Neurological: Negative.   Hematological: Negative.   Psychiatric/Behavioral: Negative.   All other systems reviewed and are negative.      Objective:    BP 116/62   Pulse 66   Temp 98.2 F (36.8 C) (Oral)   Wt 166 lb 12 oz (75.6 kg)   SpO2 99%   BMI 32.03 kg/m   Wt Readings from Last 3 Encounters:  12/22/15 166 lb 12 oz (75.6 kg)  06/18/15 179 lb 8 oz (81.4 kg)  09/11/14 184 lb 4 oz (83.6 kg)    Physical Exam  Constitutional: She is oriented to person, place, and time. She appears well-developed and well-nourished. No distress.  HENT:  Head: Normocephalic and atraumatic.  Eyes: Conjunctivae are normal.  Cardiovascular: Normal rate and regular rhythm.   Pulmonary/Chest: Effort normal and breath sounds normal.  Musculoskeletal: Normal range of motion. She exhibits no  edema.  Neurological: She is alert and oriented to person, place, and time. No cranial nerve deficit.  Skin: Skin is warm and dry. She is not diaphoretic.  Psychiatric: She has a normal mood and affect. Her behavior is normal. Judgment and thought content normal.  Nursing note and vitals reviewed.         Assessment & Plan:   History of anemia - Plan: CBC with Differential/Platelet  Vitamin D deficiency - Plan: Vitamin D, 25-hydroxy No Follow-up on file.

## 2015-12-22 NOTE — Assessment & Plan Note (Signed)
Deteriorated. Likely multifactorial- ? Worsening iron deficiency anemia.  Consider referring for IV if H/H and ferritin are low today.  ? If iron absorption altered due to history of gastric bypass. Also check Vit B12, Vit D, TSH, CMET. The patient indicates understanding of these issues and agrees with the plan. Orders Placed This Encounter  Procedures  . CBC with Differential/Platelet  . Vitamin D, 25-hydroxy  . Vitamin B12  . Comprehensive metabolic panel  . Ferritin  . TSH

## 2016-02-03 ENCOUNTER — Telehealth: Payer: Self-pay | Admitting: Family Medicine

## 2016-02-03 NOTE — Telephone Encounter (Signed)
Pt dropped off [employer] disability forms to be filled out. I placed in Rx tower.

## 2016-02-03 NOTE — Telephone Encounter (Signed)
Pt is requested it be mailed when completed. She dropped off envelope with stamp.

## 2016-02-05 NOTE — Telephone Encounter (Signed)
Dr Deborra Medina I cannot answer the questions on the form can you or waynetta help me with this. Thanks  Forms in Dr Hulen Shouts IN BOX

## 2016-02-09 NOTE — Telephone Encounter (Signed)
Robin, I am not sure how to fill this out.  What is this for?  Can you get more information from pt- ask her the questions that need to be filled out. Thanks.

## 2016-02-11 DIAGNOSIS — R69 Illness, unspecified: Secondary | ICD-10-CM | POA: Diagnosis not present

## 2016-02-11 NOTE — Telephone Encounter (Signed)
Spoke with pt she stated it was same as last time.   Per pt mail to  Journalist, [employer] [address]   Copy for pt Copy for file Copy for scan Mailed copy 2/7/rbh

## 2016-03-10 ENCOUNTER — Encounter: Payer: Self-pay | Admitting: Family Medicine

## 2016-03-10 ENCOUNTER — Ambulatory Visit (INDEPENDENT_AMBULATORY_CARE_PROVIDER_SITE_OTHER): Payer: Medicare HMO | Admitting: Family Medicine

## 2016-03-10 VITALS — BP 110/70 | HR 83 | Temp 98.2°F | Wt 161.0 lb

## 2016-03-10 DIAGNOSIS — L989 Disorder of the skin and subcutaneous tissue, unspecified: Secondary | ICD-10-CM | POA: Diagnosis not present

## 2016-03-10 DIAGNOSIS — R5383 Other fatigue: Secondary | ICD-10-CM | POA: Diagnosis not present

## 2016-03-10 LAB — CBC WITH DIFFERENTIAL/PLATELET
BASOS ABS: 0 10*3/uL (ref 0.0–0.1)
Basophils Relative: 0.3 % (ref 0.0–3.0)
Eosinophils Absolute: 0.1 10*3/uL (ref 0.0–0.7)
Eosinophils Relative: 0.6 % (ref 0.0–5.0)
HEMATOCRIT: 36.7 % (ref 36.0–46.0)
Hemoglobin: 11.9 g/dL — ABNORMAL LOW (ref 12.0–15.0)
LYMPHS PCT: 10.1 % — AB (ref 12.0–46.0)
Lymphs Abs: 1.2 10*3/uL (ref 0.7–4.0)
MCHC: 32.5 g/dL (ref 30.0–36.0)
MCV: 90.4 fl (ref 78.0–100.0)
MONOS PCT: 6.2 % (ref 3.0–12.0)
Monocytes Absolute: 0.8 10*3/uL (ref 0.1–1.0)
Neutro Abs: 10.1 10*3/uL — ABNORMAL HIGH (ref 1.4–7.7)
Neutrophils Relative %: 82.8 % — ABNORMAL HIGH (ref 43.0–77.0)
Platelets: 314 10*3/uL (ref 150.0–400.0)
RBC: 4.06 Mil/uL (ref 3.87–5.11)
RDW: 13.4 % (ref 11.5–15.5)
WBC: 12.2 10*3/uL — ABNORMAL HIGH (ref 4.0–10.5)

## 2016-03-10 LAB — VITAMIN B12: Vitamin B-12: 796 pg/mL (ref 211–911)

## 2016-03-10 LAB — TSH: TSH: 1.53 u[IU]/mL (ref 0.35–4.50)

## 2016-03-10 LAB — FERRITIN: Ferritin: 19 ng/mL (ref 10.0–291.0)

## 2016-03-10 LAB — VITAMIN D 25 HYDROXY (VIT D DEFICIENCY, FRACTURES): VITD: 21.34 ng/mL — ABNORMAL LOW (ref 30.00–100.00)

## 2016-03-10 NOTE — Progress Notes (Signed)
Subjective:   Patient ID: Tonya [name], female    DOB: 02-Feb-1950, 66 y.o.   MRN: [id number]  Tonya [name] is a pleasant 66 y.o. year old female who presents to clinic today with Fatigue (Still having issues with fatigue.)  on 03/10/2016  HPI:  Has been feeling more fatigued over past several weeks. Know h/o iron deficiency anemia and Vit D deficiency.  She has been iron daily and Vit D 1000 IU daily.  Sleeping well.  Does not feel depressed. Current Outpatient Prescriptions on File Prior to Visit  Medication Sig Dispense Refill  . aspirin 81 MG tablet Take 81 mg by mouth daily.    . cholecalciferol (VITAMIN D) 1000 UNITS tablet Take 1,000 Units by mouth daily.      . ferrous sulfate (FERROUSUL) 325 (65 FE) MG tablet Take 1 tablet (325 mg total) by mouth daily with breakfast. 30 tablet 6  . Multiple Vitamin (MULTIVITAMIN) tablet Take 1 tablet by mouth daily.    Marland Kitchen dicyclomine (BENTYL) 10 MG capsule Take 1 tab twice daily as needed for cramps. (Patient not taking: Reported on 03/10/2016) 30 capsule 1   No current facility-administered medications on file prior to visit.     Allergies  Allergen Reactions  . Bactrim [Sulfamethoxazole-Trimethoprim] Hives  . Phenergan [Promethazine Hcl]     Hives  . Sulfa Antibiotics   . Penicillins     REACTION: As a child.    Past Medical History:  Diagnosis Date  . Coronary artery disease 07/2003  . Diabetes mellitus    Resolved after gastric bypass  . Hypertension   . Obesity 2003   s/p gastric bypass     Past Surgical History:  Procedure Laterality Date  . CHOLECYSTECTOMY  1999  . GASTRIC BYPASS  2003    Family History  Problem Relation Age of Onset  . Heart attack Mother   . Heart attack Father   . Throat cancer Mother   . Uterine cancer Sister   . Clotting disorder Mother   . Liver disease Mother   . Kidney disease Mother     Social History   Social History  . Marital status: Single    Spouse name: N/A    . Number of children: 0  . Years of education: N/A   Occupational History  . retired    Social History Main Topics  . Smoking status: Never Smoker  . Smokeless tobacco: Never Used  . Alcohol use No  . Drug use: No  . Sexual activity: Not on file   Other Topics Concern  . Not on file   Social History Narrative  . No narrative on file   The PMH, PSH, Social History, Family History, Medications, and allergies have been reviewed in [facility], and have been updated if relevant.  Review of Systems  Constitutional: Positive for fatigue. Negative for fever.  Respiratory: Negative for cough and shortness of breath.   Cardiovascular: Negative.   Musculoskeletal: Negative.   Neurological: Negative.   Hematological: Negative.   Psychiatric/Behavioral: Negative.   All other systems reviewed and are negative.      Objective:    BP 110/70 (BP Location: Left Arm, Patient Position: Sitting, Cuff Size: Normal)   Pulse 83   Temp 98.2 F (36.8 C) (Oral)   Wt 161 lb (73 kg)   SpO2 98%   BMI 30.93 kg/m    Physical Exam  Constitutional: She is oriented to person, place, and time. She appears well-developed and  well-nourished. No distress.  HENT:  Head: Normocephalic and atraumatic.  Eyes: Conjunctivae are normal.  Cardiovascular: Normal rate.   Pulmonary/Chest: Effort normal.  Musculoskeletal: Normal range of motion.  Neurological: She is alert and oriented to person, place, and time. No cranial nerve deficit.  Skin: Skin is warm and dry. She is not diaphoretic.  Psychiatric: She has a normal mood and affect. Her behavior is normal. Judgment and thought content normal.  Nursing note and vitals reviewed.         Assessment & Plan:   Other fatigue - Plan: CBC with Differential/Platelet, Ferritin, Vitamin D, 25-hydroxy, Vitamin B12, TSH  Skin lesion No Follow-up on file.

## 2016-03-10 NOTE — Assessment & Plan Note (Signed)
Deteriorated. With known history of iron deficiency and Vit D deficiency. Check labs today as part of initial work up. Exam reassuring. The patient indicates understanding of these issues and agrees with the plan. Orders Placed This Encounter  Procedures  . CBC with Differential/Platelet  . Ferritin  . Vitamin D, 25-hydroxy  . Vitamin B12  . TSH

## 2016-03-10 NOTE — Patient Instructions (Signed)
Great to see you.  We will call you with your lab results. 

## 2016-03-10 NOTE — Progress Notes (Signed)
Pre visit review using our clinic review tool, if applicable. No additional management support is needed unless otherwise documented below in the visit note. 

## 2016-03-11 ENCOUNTER — Other Ambulatory Visit: Payer: Self-pay | Admitting: Family Medicine

## 2016-03-11 MED ORDER — VITAMIN D (ERGOCALCIFEROL) 1.25 MG (50000 UNIT) PO CAPS: 50000.0000 [IU] | ORAL_CAPSULE | ORAL | 0 refills | Status: DC

## 2016-06-03 ENCOUNTER — Other Ambulatory Visit: Payer: Self-pay | Admitting: Family Medicine

## 2016-06-03 ENCOUNTER — Encounter: Payer: Self-pay | Admitting: Family Medicine

## 2016-06-03 ENCOUNTER — Ambulatory Visit (INDEPENDENT_AMBULATORY_CARE_PROVIDER_SITE_OTHER): Payer: Medicare HMO | Admitting: Family Medicine

## 2016-06-03 VITALS — BP 120/70 | HR 62 | Temp 98.1°F | Wt 153.0 lb

## 2016-06-03 DIAGNOSIS — Z1211 Encounter for screening for malignant neoplasm of colon: Secondary | ICD-10-CM

## 2016-06-03 DIAGNOSIS — E559 Vitamin D deficiency, unspecified: Secondary | ICD-10-CM | POA: Diagnosis not present

## 2016-06-03 DIAGNOSIS — K649 Unspecified hemorrhoids: Secondary | ICD-10-CM | POA: Insufficient documentation

## 2016-06-03 DIAGNOSIS — K625 Hemorrhage of anus and rectum: Secondary | ICD-10-CM | POA: Diagnosis not present

## 2016-06-03 LAB — CBC WITH DIFFERENTIAL/PLATELET
BASOS ABS: 0 10*3/uL (ref 0.0–0.1)
Basophils Relative: 0.4 % (ref 0.0–3.0)
Eosinophils Absolute: 0.1 10*3/uL (ref 0.0–0.7)
Eosinophils Relative: 0.8 % (ref 0.0–5.0)
HEMATOCRIT: 33.8 % — AB (ref 36.0–46.0)
HEMOGLOBIN: 11.1 g/dL — AB (ref 12.0–15.0)
LYMPHS PCT: 15 % (ref 12.0–46.0)
Lymphs Abs: 1.1 10*3/uL (ref 0.7–4.0)
MCHC: 32.9 g/dL (ref 30.0–36.0)
MCV: 90.3 fl (ref 78.0–100.0)
Monocytes Absolute: 0.6 10*3/uL (ref 0.1–1.0)
Monocytes Relative: 7.8 % (ref 3.0–12.0)
Neutro Abs: 5.5 10*3/uL (ref 1.4–7.7)
Neutrophils Relative %: 76 % (ref 43.0–77.0)
PLATELETS: 264 10*3/uL (ref 150.0–400.0)
RBC: 3.74 Mil/uL — ABNORMAL LOW (ref 3.87–5.11)
RDW: 13.4 % (ref 11.5–15.5)
WBC: 7.2 10*3/uL (ref 4.0–10.5)

## 2016-06-03 LAB — FERRITIN: Ferritin: 12.3 ng/mL (ref 10.0–291.0)

## 2016-06-03 LAB — VITAMIN D 25 HYDROXY (VIT D DEFICIENCY, FRACTURES): VITD: 18.57 ng/mL — ABNORMAL LOW (ref 30.00–100.00)

## 2016-06-03 MED ORDER — VITAMIN D (ERGOCALCIFEROL) 1.25 MG (50000 UNIT) PO CAPS: 50000.0000 [IU] | ORAL_CAPSULE | ORAL | 0 refills | Status: DC

## 2016-06-03 NOTE — Progress Notes (Signed)
Pre visit review using our clinic review tool, if applicable. No additional management support is needed unless otherwise documented below in the visit note. 

## 2016-06-03 NOTE — Assessment & Plan Note (Signed)
Given fatigue, recheck Vit D today.

## 2016-06-03 NOTE — Patient Instructions (Signed)
Great to see you. Please stop by to see Tonya [name] on your way out.   

## 2016-06-03 NOTE — Progress Notes (Signed)
Subjective:   Patient ID: Tonya [name], female    DOB: 17-Apr-1950, 66 y.o.   MRN: [id number]  Tonya [name] is a pleasant 66 y.o. year old female who presents to clinic today with Hemorrhoids (requesting labs )  on 06/03/2016  HPI:  Has had BRPR with every BM for weeks.  Was told years ago that she has internal hemorrhoids.  No external hemorrhoids.  Bleeding is painless.  No rectal itching or irritation.  She has not been constipated or straining.  Per pt, since gastric bypass surgery years ago, she does not suffer from constipation.  No other changes in bowel habits.  No black stools.  No abdominal pain, nausea or vomiting.  Overdue for screening colonoscopy. Current Outpatient Prescriptions on File Prior to Visit  Medication Sig Dispense Refill  . aspirin 81 MG tablet Take 81 mg by mouth daily.    . cholecalciferol (VITAMIN D) 1000 UNITS tablet Take 1,000 Units by mouth daily.      Marland Kitchen dicyclomine (BENTYL) 10 MG capsule Take 1 tab twice daily as needed for cramps. 30 capsule 1  . ferrous sulfate (FERROUSUL) 325 (65 FE) MG tablet Take 1 tablet (325 mg total) by mouth daily with breakfast. 30 tablet 6  . Multiple Vitamin (MULTIVITAMIN) tablet Take 1 tablet by mouth daily.    . Vitamin D, Ergocalciferol, (DRISDOL) 50000 units CAPS capsule Take 1 capsule (50,000 Units total) by mouth every 7 (seven) days. 12 capsule 0   No current facility-administered medications on file prior to visit.     Allergies  Allergen Reactions  . Bactrim [Sulfamethoxazole-Trimethoprim] Hives  . Phenergan [Promethazine Hcl]     Hives  . Sulfa Antibiotics   . Penicillins     REACTION: As a child.    Past Medical History:  Diagnosis Date  . Coronary artery disease 07/2003  . Diabetes mellitus    Resolved after gastric bypass  . Hypertension   . Obesity 2003   s/p gastric bypass     Past Surgical History:  Procedure Laterality Date  . CHOLECYSTECTOMY  1999  . GASTRIC BYPASS   2003    Family History  Problem Relation Age of Onset  . Heart attack Mother   . Heart attack Father   . Throat cancer Mother   . Uterine cancer Sister   . Clotting disorder Mother   . Liver disease Mother   . Kidney disease Mother     Social History   Social History  . Marital status: Single    Spouse name: N/A  . Number of children: 0  . Years of education: N/A   Occupational History  . retired    Social History Main Topics  . Smoking status: Never Smoker  . Smokeless tobacco: Never Used  . Alcohol use No  . Drug use: No  . Sexual activity: Not on file   Other Topics Concern  . Not on file   Social History Narrative  . No narrative on file   The PMH, PSH, Social History, Family History, Medications, and allergies have been reviewed in [facility], and have been updated if relevant.   Review of Systems  Constitutional: Positive for fatigue. Negative for fever and unexpected weight change.  Gastrointestinal: Positive for anal bleeding and blood in stool. Negative for abdominal distention, abdominal pain, constipation, diarrhea, nausea, rectal pain and vomiting.  Musculoskeletal: Negative.   Neurological: Negative.   Hematological: Negative.   All other systems reviewed and are negative.  Objective:    BP 120/70   Pulse 62   Temp 98.1 F (36.7 C)   Wt 153 lb (69.4 kg)   SpO2 98%   BMI 29.39 kg/m    Physical Exam  Constitutional: She is oriented to person, place, and time. She appears well-developed and well-nourished. No distress.  HENT:  Head: Normocephalic and atraumatic.  Eyes: Conjunctivae are normal.  Cardiovascular: Normal rate.   Pulmonary/Chest: Effort normal.  Genitourinary:  Genitourinary Comments: Rectal exam deferred per pt requests "I dont think there is anything there to see."  Musculoskeletal: Normal range of motion.  Neurological: She is alert and oriented to person, place, and time. No cranial nerve deficit.  Skin: Skin is warm and  dry. She is not diaphoretic.  Psychiatric: She has a normal mood and affect. Her behavior is normal. Judgment and thought content normal.  Nursing note and vitals reviewed.         Assessment & Plan:   Screening for malignant neoplasm of colon - Plan: CBC with Differential/Platelet, Ambulatory referral to Gastroenterology  Hemorrhoids, unspecified hemorrhoid type - Plan: CBC with Differential/Platelet, Ambulatory referral to Gastroenterology, Ferritin, Vitamin D, 25-hydroxy  Bright red blood per rectum - Plan: CBC with Differential/Platelet, Ambulatory referral to Gastroenterology No Follow-up on file.

## 2016-06-03 NOTE — Assessment & Plan Note (Signed)
Likely internal hemorrhoids given history but as explained to pt, without colonoscopy, I cannot rule out other pathology, including malignancy. CBC today. Refer to GI. The patient indicates understanding of these issues and agrees with the plan.

## 2016-06-09 ENCOUNTER — Ambulatory Visit (INDEPENDENT_AMBULATORY_CARE_PROVIDER_SITE_OTHER): Payer: Medicare HMO | Admitting: Nurse Practitioner

## 2016-06-09 ENCOUNTER — Encounter: Payer: Self-pay | Admitting: Nurse Practitioner

## 2016-06-09 VITALS — BP 142/72 | HR 88 | Ht 60.0 in | Wt 154.0 lb

## 2016-06-09 DIAGNOSIS — K648 Other hemorrhoids: Secondary | ICD-10-CM | POA: Diagnosis not present

## 2016-06-09 DIAGNOSIS — K625 Hemorrhage of anus and rectum: Secondary | ICD-10-CM | POA: Diagnosis not present

## 2016-06-09 DIAGNOSIS — K602 Anal fissure, unspecified: Secondary | ICD-10-CM | POA: Diagnosis not present

## 2016-06-09 MED ORDER — LIDOCAINE HCL 2 % EX GEL: 1.0000 "application " | Freq: Three times a day (TID) | CUTANEOUS | 1 refills | Status: DC

## 2016-06-09 MED ORDER — DILTIAZEM GEL 2 %: 1.0000 "application " | Freq: Three times a day (TID) | CUTANEOUS | 1 refills | Status: DC

## 2016-06-09 NOTE — Progress Notes (Signed)
Cc: rectal pain and bleeding  HPI: Patient is a 66 year old female known remotely to Dr. Henrene Pastor. She had a colonoscopy and upper endoscopy in 2005 for evaluation of iron deficiency anemia. Upper endoscopy was normal. 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 Colonoscopy revealed diverticulosis and a small rectal polyp. Unable to locate pathology report.   I saw patient in 2016 with low volume ,intermittent, painless rectal bleeding. Small internal hemorrhoids on anoscopy. She was scheduled for colonoscopy since it had been greater than 10 years since her last one. Unfortunately patient was not able to have the colonoscopy as her mother who lives in [place] became ill and passed away.    Patient is referred by PCP Dr. Arnette Norris for rectal bleeding and pain. One month ago patient developed rectal pain and bleeding. She is now expelling blood with flatus and bowel movements. Her rectal discomfort is almost constant, it is worse with defecation and flatus. It hurts to sit down. Patient has had a duodenal switch, she has chronic loose stools.  Labs reviewed in Epic. Patient has chronic anemia, baseline hemoglobin of 11 to mid 12 range. He will 11 06/03/16 stable 11.1. Ferritin 12   Past Medical History:  Diagnosis Date  . Coronary artery disease 07/2003  . Diabetes mellitus    Resolved after gastric bypass  . Hypertension   . Obesity 2003   s/p gastric bypass     Patient's surgical history, family medical history, social history, medications and allergies were all reviewed in Epic    Physical Exam: BP (!) 142/72 (BP Location: Right Arm, Patient Position: Sitting, Cuff Size: Normal)   Pulse 88   Ht 5' (1.524 m)   Wt 154 lb (69.9 kg)   BMI 30.08 kg/m   GENERAL: white female in NAD PSYCH: :Pleasant, cooperative, normal affect EENT:  conjunctiva pink, mucous membranes moist, neck supple without masses CARDIAC:  RRR, no  murmur heard, 1+ BLE edema PULM: Normal respiratory  effort, lungs CTA bilaterally, no wheezing ABDOMEN:  soft, nontender, nondistended, no obvious masses, no hepatomegaly,  normal bowel sounds RECTAL: no external lesions. On anoscopy there was loose mucoid stool in vault. With insertion of anoscope fresh blood appeared and seemed to be coming from posterior wall. Unable to move anoscope for good visualization due to pain. Internal hemorrhoids were seen SKIN:  turgor, no lesions seen Musculoskeletal:  Normal muscle tone, normal strength NEURO: Alert and oriented x 3, no focal neurologic deficits   ASSESSMENT and PLAN:  1. Pleasant 66 year old female with one month history of anorectal pain/pressure and frequent rectal bleeding. Based on degree of pain I suspect a posterior fissure. Bleeding probably from internal hemorrhoids and the fissure.   -will treat for anal fissure with Diltiazem 2% TID. I showed her how to administer the diltiazem just inside the anus.  -Topical lidocaine 3 times a day when necessary for pain -Sitz baths 3 times a day -Avoid straining  2. Internal hemorrhoids.  -Holding off her treatment right now. I don't want steroids n to interfere with healing of fissure  3. Colon cancer screening. She is overdue, last colonoscopy 2005 -Patient will be scheduled for a colonoscopy with possible polypectomy.  The risks and benefits of the procedure were discussed and the patient agrees to proceed. Will schedule 4-6 weeks out to allow for treatment of fissure  4. Chronic anemia, ferritin low normal. 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 She is on oral iron.   Nevin Bloodgood  Chester Holstein , NP 06/09/2016, 10:55 AM   Cc:  Arnette Norris, MD

## 2016-06-09 NOTE — Progress Notes (Deleted)
     HPI: Patient is a 66 year old female known remotely to Dr. Henrene Pastor. She had a colonoscopy and upper endoscopy in 2005 for evaluation of iron deficiency anemia. Upper endoscopy was normal. Of note patient is status post remote bariatric surgery. Colonoscopy revealed diverticulosis and a small rectal polyp. Unable to locate pathology report.   I saw patient in [place] 2016 with low volume intermittent painless rectal bleeding. Small internal hemorrhoids on anoscopy. She was scheduled for colonoscopy since it had been greater than 10 years since her last one. Unfortunately patient was not able to have the colonoscopy as her mother  lives in [place] became ill. Patient stated extended amount of time in [place], her mother did pass away.   Patient is referred by PCP Dr. Arnette Norris for rectal pain and bleeding. One month ago patient developed rectal pain and rectal bleeding. She is now expelling blood with flatus and bowel movements. Her and her rectal discomfort is almost constant, it is worse with defecation and flatus. It hurts to sit down. Patient has had a duodenal switch, she has chronic loose stools.   Past Medical History:  Diagnosis Date  . Coronary artery disease 07/2003  . Diabetes mellitus    Resolved after gastric bypass  . Hypertension   . Obesity 2003   s/p gastric bypass     Patient's surgical history, family medical history, social history, medications and allergies were all reviewed in Epic    Physical Exam: BP (!) 142/72 (BP Location: Right Arm, Patient Position: Sitting, Cuff Size: Normal)   Pulse 88   Ht 5' (1.524 m)   Wt 154 lb (69.9 kg)   BMI 30.08 kg/m   GENERAL: white female in NAD PSYCH: :Pleasant, cooperative, normal affect EENT:  conjunctiva pink, mucous membranes moist, neck supple without masses CARDIAC:  RRR, no  murmur heard, 1+ BLE edema PULM: Normal respiratory effort, lungs CTA bilaterally, no wheezing ABDOMEN:  soft, nontender, nondistended, no  obvious masses, no hepatomegaly,  normal bowel sounds RECTAL: no external lesions. On anoscopy there was loose mucoid stool in vault. With insertion of anoscope fresh blood appeared and seemed to be coming from posterior wall. Unable to move anoscope for good visualization due to pain. Internal hemorrhoids were seen SKIN:  turgor, no lesions seen Musculoskeletal:  Normal muscle tone, normal strength NEURO: Alert and oriented x 3, no focal neurologic deficits   ASSESSMENT and PLAN:  1. Pleasant 66 year old female with one month history of anorexia pain/pressure and frequent rectal bleeding. Based on degree of pain I suspect a posterior fissure. Bleeding probably from internal hemorrhoids and the fissure.   -will treat for anal fissure with Diltiazem 2% TID. I showed her how to administer the diltiazem just inside the anus.  -Topical lidocaine 3 times a day when necessary for pain -Sitz baths 3 times a day -Avoid straining  2. Internal hemorrhoids.  -Holding off her treatment right now. I don't want steroid preparation to interfere with healing of fissure  3. Colon cancer screening. She is overdue, last colonoscopy 2005 -Patient will be scheduled for a colonoscopy with possible polypectomy.  The risks and benefits of the procedure were discussed and the patient agrees to proceed. Will schedule 4-6 weeks out to allow for treatment of fissure  4. Chronic anemia. Hgb 11.1. Ferritin 12. 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 She is on daily iron.    Tye Savoy , NP 06/09/2016, 10:55 AM

## 2016-06-09 NOTE — Patient Instructions (Signed)
If you are age 64 or older, your body mass index should be between 23-30. Your Body mass index is 30.08 kg/m. If this is out of the aforementioned range listed, please consider follow up with your Primary Care Provider.  If you are age 29 or younger, your body mass index should be between 19-25. Your Body mass index is 30.08 kg/m. If this is out of the aformentioned range listed, please consider follow up with your Primary Care Provider.   You have been scheduled for a colonoscopy. Please follow written instructions given to you at your visit today.  Please pick up your prep supplies at the pharmacy within the next 1-3 days. If you use inhalers (even only as needed), please bring them with you on the day of your procedure. Your physician has requested that you go to www.startemmi.com and enter the access code given to you at your visit today. This web site gives a general overview about your procedure. However, you should still follow specific instructions given to you by our office regarding your preparation for the procedure.  We have sent the following medications to your pharmacy for you to pick up at your convenience: Diltiazem gel Lidocaine gel  You have been given a handout for Sitz baths three times daily.  Thank you for choosing me and [facility].   Tye Savoy, NP

## 2016-06-10 ENCOUNTER — Telehealth: Payer: Self-pay | Admitting: Internal Medicine

## 2016-06-10 ENCOUNTER — Encounter: Payer: Self-pay | Admitting: Nurse Practitioner

## 2016-06-10 MED ORDER — NA SULFATE-K SULFATE-MG SULF 17.5-3.13-1.6 GM/177ML PO SOLN: 1.0000 | Freq: Once | ORAL | 0 refills | Status: AC

## 2016-06-10 NOTE — Telephone Encounter (Signed)
Sent Suprep to pharmacy 

## 2016-06-10 NOTE — Progress Notes (Signed)
Assessment and plans reviewed  

## 2016-06-25 ENCOUNTER — Encounter: Payer: Self-pay | Admitting: Internal Medicine

## 2016-07-09 ENCOUNTER — Ambulatory Visit (AMBULATORY_SURGERY_CENTER): Payer: Medicare HMO | Admitting: Internal Medicine

## 2016-07-09 ENCOUNTER — Other Ambulatory Visit: Payer: Self-pay

## 2016-07-09 ENCOUNTER — Encounter: Payer: Self-pay | Admitting: Internal Medicine

## 2016-07-09 ENCOUNTER — Other Ambulatory Visit (INDEPENDENT_AMBULATORY_CARE_PROVIDER_SITE_OTHER): Payer: Medicare HMO

## 2016-07-09 VITALS — BP 149/74 | HR 53 | Temp 97.3°F | Resp 15 | Ht 60.0 in | Wt 154.0 lb

## 2016-07-09 DIAGNOSIS — K602 Anal fissure, unspecified: Secondary | ICD-10-CM

## 2016-07-09 DIAGNOSIS — K6289 Other specified diseases of anus and rectum: Secondary | ICD-10-CM

## 2016-07-09 DIAGNOSIS — K625 Hemorrhage of anus and rectum: Secondary | ICD-10-CM

## 2016-07-09 DIAGNOSIS — C20 Malignant neoplasm of rectum: Secondary | ICD-10-CM

## 2016-07-09 DIAGNOSIS — K629 Disease of anus and rectum, unspecified: Secondary | ICD-10-CM

## 2016-07-09 DIAGNOSIS — D500 Iron deficiency anemia secondary to blood loss (chronic): Secondary | ICD-10-CM

## 2016-07-09 DIAGNOSIS — Z1211 Encounter for screening for malignant neoplasm of colon: Secondary | ICD-10-CM | POA: Diagnosis not present

## 2016-07-09 LAB — COMPREHENSIVE METABOLIC PANEL
ALBUMIN: 3.5 g/dL (ref 3.5–5.2)
ALT: 32 U/L (ref 0–35)
AST: 30 U/L (ref 0–37)
Alkaline Phosphatase: 84 U/L (ref 39–117)
BUN: 12 mg/dL (ref 6–23)
CALCIUM: 8.7 mg/dL (ref 8.4–10.5)
CHLORIDE: 107 meq/L (ref 96–112)
CO2: 25 meq/L (ref 19–32)
CREATININE: 0.52 mg/dL (ref 0.40–1.20)
GFR: 126.11 mL/min (ref >60.00)
Glucose, Bld: 79 mg/dL (ref 70–99)
POTASSIUM: 3.6 meq/L (ref 3.5–5.1)
Sodium: 143 mEq/L (ref 135–145)
Total Bilirubin: 0.4 mg/dL (ref 0.2–1.2)
Total Protein: 6.1 g/dL (ref 6.0–8.3)

## 2016-07-09 MED ORDER — SODIUM CHLORIDE 0.9 % IV SOLN: 500.0000 mL | INTRAVENOUS | Status: DC

## 2016-07-09 NOTE — Progress Notes (Signed)
Alert and oriented x3, pleased with MAC, report to RN  

## 2016-07-09 NOTE — Op Note (Signed)
[facility] Patient Name: Tonya [name] Procedure Date: 07/09/2016 8:58 AM MRN: [id number] Endoscopist: Docia Chuck. Henrene Pastor , MD Age: 66 Referring MD:  Date of Birth: Apr 15, 1950 Gender: Female Account #: [id number] Procedure:                Colonoscopy, with biopsies and submucosal injection Indications:              Rectal bleeding, Iron deficiency anemia. Had index                            colonoscopy 2005. Overdue for follow-up                            (recommended 2010). Medicines:                Monitored Anesthesia Care Procedure:                Pre-Anesthesia Assessment:                           - Prior to the procedure, a History and Physical                            was performed, and patient medications and                            allergies were reviewed. The patient's tolerance of                            previous anesthesia was also reviewed. The risks                            and benefits of the procedure and the sedation                            options and risks were discussed with the patient.                            All questions were answered, and informed consent                            was obtained. Prior Anticoagulants: The patient has                            taken no previous anticoagulant or antiplatelet                            agents. ASA Grade Assessment: II - A patient with                            mild systemic disease. After reviewing the risks                            and benefits, the patient was deemed in  satisfactory condition to undergo the procedure.                           After obtaining informed consent, the colonoscope                            was passed under direct vision. Throughout the                            procedure, the patient's blood pressure, pulse, and                            oxygen saturations were monitored continuously. The   Colonoscope was introduced through the anus and                            advanced to the the cecum, identified by                            appendiceal orifice and ileocecal valve. The                            ileocecal valve, appendiceal orifice, and rectum                            were photographed. The quality of the bowel                            preparation was good. The colonoscopy was performed                            without difficulty. The patient tolerated the                            procedure well. The bowel preparation used was                            SUPREP. Scope In: 9:07:24 AM Scope Out: 9:31:12 AM Scope Withdrawal Time: 0 hours 18 minutes 57 seconds  Total Procedure Duration: 0 hours 23 minutes 48 seconds  Findings:                 A non-obstructing large friable mass was found in                            the distal rectum. This began at approximately 1 cm                            above the anal verge and extended proximal for a                            distance of 8-10 cm. The lesion was bulky and                            involved  approximately 75% of the luminal                            circumference. Multiple biopsies were taken. Area                            was tattooed with an injection of Spot (carbon                            black) just proximal AND distal to the lesion.                           A few small-mouthed diverticula were found in the                            left colon.                           The exam was otherwise without abnormality on                            direct views. Retroflexion not performed purposely. Complications:            No immediate complications. Estimated blood loss:                            None. Estimated Blood Loss:     Estimated blood loss: none. 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 Biopsied.                            Tattooed.                           -  Diverticulosis in the left colon.                           - The examination was otherwise normal . Recommendation:           1. Contrast-enhanced CT scan of the chest abdomen                            and pelvis "rectal cancer, rule out metastatic                            disease"                           2. Obtain CEA level                           3. Rectal endoscopic ultrasound with Dr. Ardis Hughs (if                            CT negative for metastatic disease)  4. GI oncology coordinator referral can assist with                            general surgical referral, radiation oncology                            referral, and medical oncology referral                           5.Continue present medications.                           6. Await pathology results.                           7. Surveillance colonoscopy in 1 year Lydell Moga N. Henrene Pastor, MD 07/09/2016 9:47:01 AM This report has been signed electronically.

## 2016-07-09 NOTE — Progress Notes (Signed)
Tonya [name], CMA form 3 rd floor ordered Tonya [name] CMP and CEA level, Tonya [name] to LAB on discharge.  [name]< CMA also ordered CT of chest, abd and pelvis.  She went over instructions with Tonya [name] and gave her 2 bits of oral contrast.  No complaints noted on discharge.

## 2016-07-09 NOTE — Progress Notes (Signed)
Called to room to assist during endoscopic procedure.  Patient ID and intended procedure confirmed with present staff. Received instructions for my participation in the procedure from the performing physician.  

## 2016-07-09 NOTE — Patient Instructions (Addendum)
YOU HAD AN ENDOSCOPIC PROCEDURE TODAY AT [facility]:   Refer to the procedure report that was given to you for any specific questions about what was found during the examination.  If the procedure report does not answer your questions, please call your gastroenterologist to clarify.  If you requested that your care partner not be given the details of your procedure findings, then the procedure report has been included in a sealed envelope for you to review at your convenience later.  YOU SHOULD EXPECT: Some feelings of bloating in the abdomen. Passage of more gas than usual.  Walking can help get rid of the air that was put into your GI tract during the procedure and reduce the bloating. If you had a lower endoscopy (such as a colonoscopy or flexible sigmoidoscopy) you may notice spotting of blood in your stool or on the toilet paper. If you underwent a bowel prep for your procedure, you may not have a normal bowel movement for a few days.  Please Note:  You might notice some irritation and congestion in your nose or some drainage.  This is from the oxygen used during your procedure.  There is no need for concern and it should clear up in a day or so.  SYMPTOMS TO REPORT IMMEDIATELY:   Following lower endoscopy (colonoscopy or flexible sigmoidoscopy):  Excessive amounts of blood in the stool  Significant tenderness or worsening of abdominal pains  Swelling of the abdomen that is new, acute  Fever of 100F or higher  For urgent or emergent issues, a gastroenterologist can be reached at any hour by calling [phone].   DIET:  We do recommend a small meal at first, but then you may proceed to your regular diet.  Drink plenty of fluids but you should avoid alcoholic beverages for 24 hours.  ACTIVITY:  You should plan to take it easy for the rest of today and you should NOT DRIVE or use heavy machinery until tomorrow (because of the sedation medicines used during the test).     FOLLOW UP: Our staff will call the number listed on your records the next business day following your procedure to check on you and address any questions or concerns that you may have regarding the information given to you following your procedure. If we do not reach you, we will leave a message.  However, if you are feeling well and you are not experiencing any problems, there is no need to return our call.  We will assume that you have returned to your regular daily activities without incident.  If any biopsies were taken you will be contacted by phone or by letter within the next 1-3 weeks.  Please call us at [phone] if you have not heard about the biopsies in 3 weeks.    SIGNATURES/CONFIDENTIALITY: You and/or your care partner have signed paperwork which will be entered into your electronic medical record.  These signatures attest to the fact that that the information above on your After Visit Summary has been reviewed and is understood.  Full responsibility of the confidentiality of this discharge information lies with you and/or your care-partner.  CT scan of chest, abdomen and pelvis to be scheduled.  Dr. Blanch Media nurse will call you with this appointment. Blood work today on discharge. CEA & CMP Rectal endoscopic ultrasound with Dr. Ardis Hughs if CT is negative for metastatic disease. Handouts were given to your care partner on diverticulosis. You may resume your current medications  today. Await biopsy results. Please call if any questions or concerns.

## 2016-07-10 LAB — CEA: CEA: 13.9 ng/mL — AB

## 2016-07-12 ENCOUNTER — Telehealth: Payer: Self-pay

## 2016-07-12 ENCOUNTER — Ambulatory Visit (INDEPENDENT_AMBULATORY_CARE_PROVIDER_SITE_OTHER)
Admission: RE | Admit: 2016-07-12 | Discharge: 2016-07-12 | Disposition: A | Discharge status: Home / Self Care | Payer: Medicare HMO | Source: Ambulatory Visit | Attending: Gastroenterology | Admitting: Gastroenterology

## 2016-07-12 DIAGNOSIS — K629 Disease of anus and rectum, unspecified: Secondary | ICD-10-CM

## 2016-07-12 DIAGNOSIS — K6289 Other specified diseases of anus and rectum: Secondary | ICD-10-CM | POA: Diagnosis not present

## 2016-07-12 DIAGNOSIS — R918 Other nonspecific abnormal finding of lung field: Secondary | ICD-10-CM | POA: Diagnosis not present

## 2016-07-12 MED ORDER — IOPAMIDOL (ISOVUE-300) INJECTION 61%
100.0000 mL | Freq: Once | INTRAVENOUS | Status: AC | PRN
  Administered 2016-07-12: 100 mL via INTRAVENOUS

## 2016-07-12 NOTE — Telephone Encounter (Signed)
  Follow up Call-  Call back number 07/09/2016  Post procedure Call Back phone  # [phone]  Permission to leave phone message Yes  Some recent data might be hidden     Patient questions:  Do you have a fever, pain , or abdominal swelling? No. Pain Score  0 *  Have you tolerated food without any problems? Yes.    Have you been able to return to your normal activities? Yes.    Do you have any questions about your discharge instructions: Diet   No. Medications  No. Follow up visit  No.  Do you have questions or concerns about your Care? No.  Actions: * If pain score is 4 or above: No action needed, pain <4.  Pt is going for her CT scan today.  No further questions. maw

## 2016-07-13 ENCOUNTER — Other Ambulatory Visit: Payer: Self-pay

## 2016-07-13 ENCOUNTER — Telehealth: Payer: Self-pay

## 2016-07-13 DIAGNOSIS — C20 Malignant neoplasm of rectum: Secondary | ICD-10-CM

## 2016-07-13 NOTE — Telephone Encounter (Signed)
EUS scheduled, pt instructed and medications reviewed.  Patient instructions mailed to home.  Patient to call with any questions or concerns.  

## 2016-07-13 NOTE — Telephone Encounter (Signed)
-----   Message from Milus Banister, MD sent at 07/13/2016  7:12 AM EDT ----- Regarding: RE: Rectal EUS I looked at his scans, no sign of mets.   Please let him know that [facility] will be calling him about lower EUS staging. Also, I don't see referrals in the system to surgery, med onc, etc.  Dabid Godown, He needs lower EUS, radial, next Thursday (7/19) at [facility], moderate sedation. For rectal cancer staging.    Thanks   ----- Message ----- From: Algernon Huxley, RN Sent: 07/12/2016   4:48 PM To: Milus Banister, MD Subject: Rectal EUS                                     Dr. Ardis Hughs,  Dr. Henrene Pastor did a colon on this pt 07/09/16 and request rectal EUS if CT negative for mets. Pt had CT done today but results are not back yet. I will keep you posted.  Thanks, [employer]

## 2016-07-13 NOTE — Telephone Encounter (Signed)
Lower EUS scheduled for 07/22/16 230 pm Left message on machine to call back

## 2016-07-14 ENCOUNTER — Encounter: Payer: Self-pay | Admitting: Radiation Oncology

## 2016-07-14 ENCOUNTER — Telehealth: Payer: Self-pay

## 2016-07-14 NOTE — Telephone Encounter (Signed)
Referral faxed to [facility] for surgical appt for rectal cancer.

## 2016-07-16 ENCOUNTER — Encounter: Payer: Self-pay | Admitting: Hematology

## 2016-07-16 ENCOUNTER — Telehealth: Payer: Self-pay | Admitting: Hematology

## 2016-07-16 ENCOUNTER — Telehealth: Payer: Self-pay

## 2016-07-16 NOTE — Telephone Encounter (Signed)
Appt has been scheduled for the pt to see Dr. Burr Medico on 7/17 at 130pm. Pt aware to arrive 30 minutes early. Letter mailed.

## 2016-07-16 NOTE — Telephone Encounter (Signed)
-----   Message from Milus Banister, MD sent at 07/16/2016 12:35 PM EDT ----- Tonya [name], If she is getting an MRI anyway, may as well get pelvic cuts to get [place] staging information.  Will be no need for the EUS then.   [name], Can you let her know that MRI which Dr. Burr Medico is ordering will suffice for local staging and that she will therefore NOT need the EUS.  Please go ahead and cancel it, I think it's already on for next week.  [name]  DJ  ----- Message ----- From: Truitt Merle, MD Sent: 07/16/2016   8:32 AM To: Milus Banister, MD, Irene Shipper, MD, #  1:30pm on 7/17  [name], do you plan to do EUS for her low rectal cancer? She probably needs a MRI of abdomen anyway for indeterminate liver and adrenal region, I can get MRI pelvis also for staging.   I will add her to GI tumor board next week  Thanks  Tonya [name]    ----- Message ----- From: [name] Sent: 07/15/2016   4:44 PM To: Truitt Merle, MD  Dr. Burr Medico,   Referral from Dr. Henrene Pastor. Dx: rectal cancer. Please advise.  Seth Bake

## 2016-07-16 NOTE — Telephone Encounter (Signed)
Pt has been advised and [facility] endo aware to cancel the EUS

## 2016-07-19 DIAGNOSIS — C20 Malignant neoplasm of rectum: Secondary | ICD-10-CM | POA: Diagnosis not present

## 2016-07-19 NOTE — Progress Notes (Signed)
[place]  [phone] Fax:([phone] [place] Note   Patient Care Team: Lucille Passy, MD as PCP - Gwenevere Abbot, Docia Chuck, MD as Consulting Physician (Gastroenterology) Leighton Ruff, MD as Consulting Physician (General Surgery) Truitt Merle, MD as Consulting Physician (Hematology) Kyung Rudd, MD as Consulting Physician (Radiation Oncology) 07/20/2016  Referring physician: Dr. Henrene Pastor  CHIEF COMPLAINTS/PURPOSE OF CONSULTATION:  Invasive Rectal Adenocarcinoma    Rectal adenocarcinoma ([place])   07/09/2016 Pathology Results    Diagnosis Surgical [P], rectum mass - INVASIVE ADENOCARCINOMA. - SEE COMMENT.      07/09/2016 Procedure    Colonoscopy A non-obstructing large friable mass was found in the distal rectum. This began at approximately 1 cm above the anal verge and extended proximal for a distance of 8-10 cm. The lesion was bulky and involved approximately 75% of the luminal circumference. Multiple biopsies were taken. A few small-mouthed diverticula were found in the left colon. The exam was otherwise without abnormality on direct views. Retroflexion not performed purposely.      07/12/2016 Imaging    CT C/A/P with contrast IMPRESSION: Rectal wall thickening/mass, compatible with known rectal adenocarcinoma. No findings specific for metastatic disease in the chest, abdomen, or pelvis. 2.2 cm probable hemangioma in segment 4B, although poorly evaluated. Consider MRI abdomen with/ without multihance contrast for definitive characterization, as clinically warranted. Bilateral adrenal nodules measuring up to 1.6 cm on the right, likely reflecting benign adrenal adenomas, although technically indeterminate. If MRI abdomen is performed, these can be definitively characterized at that time.      07/20/2016 Initial Diagnosis    Rectal adenocarcinoma ([facility])      HISTORY OF PRESENTING ILLNESS:  Tonya [name] 66 y.o. female is here because  of recently discovered invasive adenocarcinoma. She is accompanied by a friend today. The patient underwent colonoscopy on 07/09/16 with Dr. [name]. According to Dr. Blanch Media note from that encounter, the patient presented with complaints of rectal bleeding and iron deficiency anemia. She reports she was told she has hemorrhoids and initially was not concerned about the rectal bleeding. Her most recent prior colonoscopy was 2005. On exam, Dr. Henrene Pastor noted a non-obstructing large friable mass was found in the distal rectum. This began at approximately 1 cm above the anal verge and extended proximal for a distance of 8-10 cm. The lesion was bulky and involved approximately 75% of the luminal circumference. Biopsy of the rectal mass on 07/09/16 showed invasive adenocarcinoma.  CT C/A/P on 07/12/16 revealed rectal wall thickening/mass, compatible with known rectal adenocarcinoma.No findings specific for metastatic disease in the chest, abdomen, or pelvis. 2.2 cm probable hemangioma in segment 4B, although poorly evaluated. Consider MRI abdomen with/ without multihance contrast for definitive characterization, as clinically warranted. Bilateral adrenal nodules measuring up to 1.6 cm on the right, likely reflecting benign adrenal adenomas, although technically indeterminate. If MRI abdomen is performed, these can be definitively characterized at that time.  On 07/19/16 the patient consulted with Dr. Leighton Ruff of [facility]. Per her note, she recommends complete metastatic workup with MRI to evaluate liver, adrenal gland, and rectum. The patient will follow up with Dr. Marcello Moores following neoadjuvant therapy to discuss surgical resection.  The patient reports she has lost 30 lbs since January. She reports ongoing fatigue, which her PCP related to iron deficiency and vitamin D deficiency. She reports occasional incontinence, and irregular bowel activity. She reports ongoing bright red rectal bleeding. She  denies cramps or pain. She takes  2 iron pills daily.   MEDICAL HISTORY:  Past Medical History:  Diagnosis Date  . Coronary artery disease 07/2003  . Myocardial infarction ([place])   . Obesity 2003   s/p gastric bypass     SURGICAL HISTORY: Past Surgical History:  Procedure Laterality Date  . CHOLECYSTECTOMY  1999  . GASTRIC BYPASS  2003    SOCIAL HISTORY: Social History   Social History  . Marital status: Single    Spouse name: N/A  . Number of children: 0  . Years of education: N/A   Occupational History  . retired    Social History Main Topics  . Smoking status: Never Smoker  . Smokeless tobacco: Never Used  . Alcohol use No  . Drug use: No  . Sexual activity: Not on file   Other Topics Concern  . Not on file   Social History Narrative  . No narrative on file    FAMILY HISTORY: Family History  Problem Relation Age of Onset  . Heart attack Mother   . Throat cancer Mother   . Clotting disorder Mother   . Liver disease Mother   . Kidney disease Mother   . Heart attack Father   . Uterine cancer Sister   . Uterine cancer Sister     ALLERGIES:  is allergic to bactrim [sulfamethoxazole-trimethoprim]; phenergan [promethazine hcl]; sulfa antibiotics; and penicillins.  MEDICATIONS:  Current Outpatient Prescriptions  Medication Sig Dispense Refill  . aspirin 81 MG tablet Take 81 mg by mouth daily.    . cholecalciferol (VITAMIN D) 1000 UNITS tablet Take 1,000 Units by mouth daily.      Marland Kitchen dicyclomine (BENTYL) 10 MG capsule Take 1 tab twice daily as needed for cramps. 30 capsule 1  . diltiazem 2 % GEL Apply 1 application topically 3 (three) times daily. As directed 30 g 1  . ferrous sulfate (FERROUSUL) 325 (65 FE) MG tablet Take 1 tablet (325 mg total) by mouth daily with breakfast. 30 tablet 6  . lidocaine (XYLOCAINE) 2 % jelly Apply 1 application topically 3 (three) times daily. As needed 30 mL 1  . Multiple Vitamin (MULTIVITAMIN) tablet Take 1 tablet by mouth  daily.    . Vitamin D, Ergocalciferol, (DRISDOL) 50000 units CAPS capsule Take 1 capsule (50,000 Units total) by mouth every 7 (seven) days. 12 capsule 0   Current Facility-Administered Medications  Medication Dose Route Frequency Provider Last Rate Last Dose  . 0.9 %  sodium chloride infusion  500 mL Intravenous Continuous Irene Shipper, MD        REVIEW OF SYSTEMS:  Constitutional: Denies fevers, chills or abnormal night sweats, (+) ongoing fatigue Eyes: Denies blurriness of vision, double vision or watery eyes Ears, nose, mouth, throat, and face: Denies mucositis or sore throat Respiratory: Denies cough, dyspnea or wheezes Cardiovascular: Denies palpitation, chest discomfort or lower extremity swelling Gastrointestinal:  Denies nausea, heartburn, (+) incontinence, hematochezia, irregular bowel activity Skin: Denies abnormal skin rashes Lymphatics: Denies new lymphadenopathy or easy bruising Neurological:Denies numbness, tingling or new weaknesses Behavioral/Psych: Mood is stable, no new changes  All other systems were reviewed with the patient and are negative.  PHYSICAL EXAMINATION: ECOG PERFORMANCE STATUS: 1 - Symptomatic but completely ambulatory  Vitals:   07/20/16 1326  BP: 132/78  Pulse: 72  Resp: 18  Temp: 99.4 F (37.4 C)   Filed Weights   07/20/16 1326  Weight: 148 lb 14.4 oz (67.5 kg)    GENERAL:alert, no distress and comfortable SKIN: skin color, texture,  turgor are normal, no rashes or significant lesions EYES: normal, conjunctiva are pink and non-injected, sclera clear OROPHARYNX:no exudate, no erythema and lips, buccal mucosa, and tongue normal NECK: supple, thyroid normal size, non-tender, without nodularity LYMPH:  no palpable lymphadenopathy in the cervical, axillary or inguinal LUNGS: clear to auscultation and percussion with normal breathing effort HEART: regular rate & rhythm and no murmurs and no lower extremity edema ABDOMEN:abdomen soft,  non-tender, and normal bowel sounds. No organomegaly. Musculoskeletal:no cyanosis of digits and no clubbing PSYCH: alert & oriented x 3 with fluent speech NEURO: no focal motor/sensory deficits Rectal Exam: There is a palpable mass in the posterior right, which is firm, and approximately 3 cm from the anal ridge. Blood on tip of glove.   LABORATORY DATA:  I have reviewed the data as listed. CBC Latest Ref Rng & Units 06/03/2016 03/10/2016 12/22/2015  WBC 4.0 - 10.5 K/uL 7.2 12.2(H) 8.9  Hemoglobin 12.0 - 15.0 g/dL 11.1(L) 11.9(L) 12.2  Hematocrit 36.0 - 46.0 % 33.8(L) 36.7 37.1  Platelets 150.0 - 400.0 K/uL 264.0 314.0 292.0   CMP Latest Ref Rng & Units 07/09/2016 12/22/2015 04/17/2014  Glucose 70 - 99 mg/dL 79 135(H) 107(H)  BUN 6 - 23 mg/dL 12 12 17   Creatinine 0.40 - 1.20 mg/dL 0.52 0.53 0.66  Sodium 135 - 145 mEq/L 143 141 139  Potassium 3.5 - 5.1 mEq/L 3.6 4.0 4.2  Chloride 96 - 112 mEq/L 107 108 106  CO2 19 - 32 mEq/L 25 26 27   Calcium 8.4 - 10.5 mg/dL 8.7 8.9 9.2  Total Protein 6.0 - 8.3 g/dL 6.1 6.1 6.6  Total Bilirubin 0.2 - 1.2 mg/dL 0.4 0.3 0.3  Alkaline Phos 39 - 117 U/L 84 93 83  AST 0 - 37 U/L 30 24 24   ALT 0 - 35 U/L 32 28 33    PATHOLOGY: Diagnosis Surgical [P], rectum mass - INVASIVE ADENOCARCINOMA. - SEE COMMENT. Microscopic Comment Dr. Vicente Males has reviewed the case and concurs with this interpretation. Dr. Henrene Pastor was paged on 07/13/2016. (JBK:kh 07/13/16)  RADIOGRAPHIC STUDIES: I have personally reviewed the radiological images as listed and agreed with the findings in the report. Ct Chest W Contrast  Result Date: 07/12/2016 CLINICAL DATA:  Rectal mass, newly diagnosed rectal cancer, evaluate for metastatic disease EXAM: CT CHEST, ABDOMEN, AND PELVIS WITH CONTRAST TECHNIQUE: Multidetector CT imaging of the chest, abdomen and pelvis was performed following the standard protocol during bolus administration of intravenous contrast. CONTRAST:  168mL ISOVUE-300  IOPAMIDOL (ISOVUE-300) INJECTION 61% COMPARISON:  None. FINDINGS: CT CHEST FINDINGS Cardiovascular: The heart is normal in size. No pericardial effusion. Mild coronary atherosclerosis in the LAD and right coronary artery. No evidence of thoracic aortic aneurysm. Mild atherosclerotic calcifications of the aortic arch. Mediastinum/Nodes: No suspicious mediastinal lymphadenopathy. Visualized thyroid is unremarkable. Lungs/Pleura: Lungs are clear. No suspicious pulmonary nodules. No focal consolidation. No pleural effusion or pneumothorax. Musculoskeletal: Degenerative changes of the thoracic spine. CT ABDOMEN PELVIS FINDINGS Motion degraded images. Hepatobiliary: 2.2 cm probable hemangioma in segment 4B (series 2/image 55), although poorly evaluated. Additional 5 mm probable cyst in segment 3 (series 2/ image 54). Status post cholecystectomy. No intrahepatic or extrahepatic ductal dilatation. Pancreas: Within normal limits. Spleen: Within normal limits. Adrenals/Urinary Tract: 1.6 cm right adrenal nodule (series 2/image 51). 11 mm left adrenal nodule (series 2/ image 7). These are technically indeterminate but likely reflect small benign adrenal adenomas. 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  Two nonobstructing right renal calculi measuring 2-3 mm. Left kidney is within normal limits. No hydronephrosis. Bladder is within normal limits. Stomach/Bowel: Stomach is notable for postsurgical changes related to gastric bypass. No evidence of bowel obstruction. Normal appendix (series 2/ image 86). Rectal wall thickening/ mass (sagittal image 78), compatible with known rectal adenocarcinoma. Vascular/Lymphatic: No evidence of abdominal aortic aneurysm. No suspicious abdominopelvic lymphadenopathy. Specifically, no suspicious perirectal lymphadenopathy. Reproductive: Uterus is within normal limits. Bilateral ovaries are within normal limits. Other: No abdominopelvic  ascites. Musculoskeletal: Mild degenerative changes of the lumbar spine. IMPRESSION: Rectal wall thickening/mass, compatible with known rectal adenocarcinoma. No findings specific for metastatic disease in the chest, abdomen, or pelvis. 2.2 cm probable hemangioma in segment 4B, although poorly evaluated. Consider MRI abdomen with/ without multihance contrast for definitive characterization, as clinically warranted. Bilateral adrenal nodules measuring up to 1.6 cm on the right, likely reflecting benign adrenal adenomas, although technically indeterminate. If MRI abdomen is performed, these can be definitively characterized at that time. Electronically Signed   By: Julian Hy M.D.   On: 07/12/2016 17:04   Ct Abdomen Pelvis W Contrast  Result Date: 07/12/2016 CLINICAL DATA:  Rectal mass, newly diagnosed rectal cancer, evaluate for metastatic disease EXAM: CT CHEST, ABDOMEN, AND PELVIS WITH CONTRAST TECHNIQUE: Multidetector CT imaging of the chest, abdomen and pelvis was performed following the standard protocol during bolus administration of intravenous contrast. CONTRAST:  129mL ISOVUE-300 IOPAMIDOL (ISOVUE-300) INJECTION 61% COMPARISON:  None. FINDINGS: CT CHEST FINDINGS Cardiovascular: The heart is normal in size. No pericardial effusion. Mild coronary atherosclerosis in the LAD and right coronary artery. No evidence of thoracic aortic aneurysm. Mild atherosclerotic calcifications of the aortic arch. Mediastinum/Nodes: No suspicious mediastinal lymphadenopathy. Visualized thyroid is unremarkable. Lungs/Pleura: Lungs are clear. No suspicious pulmonary nodules. No focal consolidation. No pleural effusion or pneumothorax. Musculoskeletal: Degenerative changes of the thoracic spine. CT ABDOMEN PELVIS FINDINGS Motion degraded images. Hepatobiliary: 2.2 cm probable hemangioma in segment 4B (series 2/image 55), although poorly evaluated. Additional 5 mm probable cyst in segment 3 (series 2/ image 54). Status post  cholecystectomy. No intrahepatic or extrahepatic ductal dilatation. Pancreas: Within normal limits. Spleen: Within normal limits. Adrenals/Urinary Tract: 1.6 cm right adrenal nodule (series 2/image 51). 11 mm left adrenal nodule (series 2/ image 5). These are technically indeterminate but likely reflect small benign adrenal adenomas. 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 Two nonobstructing right renal calculi measuring 2-3 mm. Left kidney is within normal limits. No hydronephrosis. Bladder is within normal limits. Stomach/Bowel: Stomach is notable for postsurgical changes related to gastric bypass. No evidence of bowel obstruction. Normal appendix (series 2/ image 86). Rectal wall thickening/ mass (sagittal image 78), compatible with known rectal adenocarcinoma. Vascular/Lymphatic: No evidence of abdominal aortic aneurysm. No suspicious abdominopelvic lymphadenopathy. Specifically, no suspicious perirectal lymphadenopathy. Reproductive: Uterus is within normal limits. Bilateral ovaries are within normal limits. Other: No abdominopelvic ascites. Musculoskeletal: Mild degenerative changes of the lumbar spine. IMPRESSION: Rectal wall thickening/mass, compatible with known rectal adenocarcinoma. No findings specific for metastatic disease in the chest, abdomen, or pelvis. 2.2 cm probable hemangioma in segment 4B, although poorly evaluated. Consider MRI abdomen with/ without multihance contrast for definitive characterization, as clinically warranted. Bilateral adrenal nodules measuring up to 1.6 cm on the right, likely reflecting benign adrenal adenomas, although technically indeterminate. If MRI abdomen is performed, these can be definitively characterized at that time. Electronically Signed   By: Julian Hy M.D.   On: 07/12/2016  17:04    ASSESSMENT & PLAN:  Tonya [name] is a 66 y.o. woman with past medical history of gastric bypass surgery,  presented with rectal bleeding, loose bowel movement and stool incontinence for one half years, was recently diagnosed invasive rectal adenocarcinoma.  1. Invasive Rectal Adenocarcinoma, cTxNxMx -I reviewed his CT scan findings, and surgical pathology results in great details with patient and her friend.  -We discussed that lesions in the liver and adrenal glands are likely benign, but I'll get a MRI of abdomen for further evaluation. -I recommend MRI of pelvis for cancer staging -Her rectal cancer is clinically locally advanced, likely with a needed new adjuvant chemotherapy and irradiation, followed by surgery and adjuvant chemotherapy. -We discussed the role of neoadjuvant chemotherapy for colorectal cancer, to shrink the tumor and reduce the risk of cancer recurrence after surgery -She has seen colorectal surgeon Dr. Marcello Moores yesterday, surgical option of APR vs low anterior resection were discussed, Dr. Marcello Moores feels she needs permanent colostomy. Due to her prior gastric bypass surgery, however patient is very reluctant to have surgery due to the need of colostomy bag. -We discussed the surgery is standard care, and I strongly encouraged her to consider. We discussed the small chance (10%) she may have completed response to chemotherapy and radiation, and if she does not want surgery, she can be followed closely after chemotherapy and irradiation. -we discussed the regimens for neoadjuvant chemotherapy, including single agent Xeloda or 5-FU. The potential side effects and logistics were discussed with her. Patient opted Xeloda --Chemotherapy consent: Side effects including but does not not limited to, fatigue, nausea, vomiting, diarrhea, hair loss, neuropathy, fluid retention, renal and kidney dysfunction, neutropenic fever, needed for blood transfusion, bleeding, coronary artery spasm and heart attack, were discussed with patient in great detail. She agrees to proceed. She will attend a chemotherapy  class. -We also discussed the benefit of adjuvant chemotherapy. The regimen of single agent Xeloda versus combination with oxaliplatin, were discussed with her, he will be determined after her surgery. - I will refer the patient to nutrition and social work as needed. - Patient will consult with Dr. [name] in radiation oncology next week. - The patient will have CBC and iron study labs today. If her iron is low I will schedule IV iron prior to beginning chemo.  2. Iron Deficient Anemia - The patient takes 2 iron supplements daily. - Her iron has been somewhat low. - She will stop taking aspirin to reduce her bleeding risk. -We'll repeat her iron study today, to see if she needs IV iron. Due to her prior gastric bypass surgery, she may have iron absorption issue. Gastric  3. Genetics -Patient has 2 sisters who had uterine cancer, this is suspicious for lynch syndrome. - I encouraged the patient to pursue genetic testing based on her family history. This will determine if the patient carries deleterious genes or is at risk for lynch syndrome. - The patient is in agreement and I will refer her to these services.  4. History of gastric bypass surgery in 2003 -for obesity   5. History of DM and HTN -Resolved after her gastric bypass surgery. -We'll monitor her blood pressure and glucose closely during her chemotherapy treatment.  PLAN:  - Repeat CBC and iron study labs today. If IV iron is needed I will schedule this. - I will order MRI to assess liver and adrenal lesions and for rectal cancer staging  - Patient will consult with Dr. [name] in radiation  oncology next week. - Referral to Nutrition consult  - Follow up in 2 weeks to discuss beginning Xeloda, 3 pills twice daily Mon-Fri. Patient will attend chemo class prior. -I will send a prescription of Xeloda to specialty pharmacy today.    Orders Placed This Encounter  Procedures  . MR Abdomen W Wo Contrast    Standing Status:    Future    Standing Expiration Date:   09/20/2017    Order Specific Question:   If indicated for the ordered procedure, I authorize the administration of contrast media per Radiology protocol    Answer:   Yes    Order Specific Question:   Reason for Exam (SYMPTOM  OR DIAGNOSIS REQUIRED)    Answer:   evaluate liver, adrenal gland lesions, rule out metastasis,  and rectal cancer staging    Order Specific Question:   What is the patient's sedation requirement?    Answer:   No Sedation    Order Specific Question:   Does the patient have a pacemaker or implanted devices?    Answer:   No    Order Specific Question:   Preferred imaging location?    Answer:   [facility] (table limit-350 lbs)    Order Specific Question:   Radiology Contrast Protocol - do NOT remove file path    Answer:   \\[email or web address]\Radiant\[email or web address].PDF  . MR Pelvis W Wo Contrast    Standing Status:   Future    Standing Expiration Date:   09/20/2017    Order Specific Question:   If indicated for the ordered procedure, I authorize the administration of contrast media per Radiology protocol    Answer:   Yes    Order Specific Question:   Reason for Exam (SYMPTOM  OR DIAGNOSIS REQUIRED)    Answer:   evaluate liver, adrenal gland lesions, rule out metastasis,  and rectal cancer staging    Order Specific Question:   What is the patient's sedation requirement?    Answer:   No Sedation    Order Specific Question:   Does the patient have a pacemaker or implanted devices?    Answer:   No    Order Specific Question:   Preferred imaging location?    Answer:   [facility] (table limit-350 lbs)    Order Specific Question:   Radiology Contrast Protocol - do NOT remove file path    Answer:   \\[email or web address].PDF  . CBC & Diff and Retic    Standing Status:   Standing    Number of Occurrences:   50    Standing Expiration Date:   07/20/2021  . Ferritin    Standing Status:   Standing    Number  of Occurrences:   50    Standing Expiration Date:   07/20/2021  . Iron and TIBC    Standing Status:   Standing    Number of Occurrences:   50    Standing Expiration Date:   07/20/2021   All questions were answered. The patient knows to call the clinic with any problems, questions or concerns.  I spent 55 minutes counseling the patient face to face. The total time spent in the appointment was 60 minutes and more than 50% was on counseling.  This document serves as a record of services personally performed by Truitt Merle, MD. It was created on her behalf by Maryla Morrow, a trained medical scribe. The creation of this record is based on the scribe's personal observations and the  provider's statements to them. This document has been checked and approved by the attending provider.    Truitt Merle, MD 07/20/2016 2:36 PM

## 2016-07-20 ENCOUNTER — Ambulatory Visit (HOSPITAL_BASED_OUTPATIENT_CLINIC_OR_DEPARTMENT_OTHER): Payer: Medicare HMO | Admitting: Hematology

## 2016-07-20 ENCOUNTER — Telehealth: Payer: Self-pay | Admitting: Hematology

## 2016-07-20 ENCOUNTER — Encounter: Payer: Self-pay | Admitting: Hematology

## 2016-07-20 ENCOUNTER — Ambulatory Visit (HOSPITAL_BASED_OUTPATIENT_CLINIC_OR_DEPARTMENT_OTHER): Payer: Medicare HMO

## 2016-07-20 DIAGNOSIS — Z8049 Family history of malignant neoplasm of other genital organs: Secondary | ICD-10-CM | POA: Diagnosis not present

## 2016-07-20 DIAGNOSIS — D509 Iron deficiency anemia, unspecified: Secondary | ICD-10-CM

## 2016-07-20 DIAGNOSIS — C20 Malignant neoplasm of rectum: Secondary | ICD-10-CM | POA: Insufficient documentation

## 2016-07-20 DIAGNOSIS — Z9884 Bariatric surgery status: Secondary | ICD-10-CM

## 2016-07-20 DIAGNOSIS — D500 Iron deficiency anemia secondary to blood loss (chronic): Secondary | ICD-10-CM

## 2016-07-20 LAB — CBC & DIFF AND RETIC
BASO%: 0.1 % (ref 0.0–2.0)
BASOS ABS: 0 10*3/uL (ref 0.0–0.1)
EOS ABS: 0.1 10*3/uL (ref 0.0–0.5)
EOS%: 0.8 % (ref 0.0–7.0)
HEMATOCRIT: 36 % (ref 34.8–46.6)
HEMOGLOBIN: 11.7 g/dL (ref 11.6–15.9)
IMMATURE RETIC FRACT: 8.3 % (ref 1.60–10.00)
LYMPH%: 15.1 % (ref 14.0–49.7)
MCH: 29.5 pg (ref 25.1–34.0)
MCHC: 32.5 g/dL (ref 31.5–36.0)
MCV: 90.7 fL (ref 79.5–101.0)
MONO#: 0.8 10*3/uL (ref 0.1–0.9)
MONO%: 8.7 % (ref 0.0–14.0)
NEUT#: 6.8 10*3/uL — ABNORMAL HIGH (ref 1.5–6.5)
NEUT%: 75.3 % (ref 38.4–76.8)
NRBC: 0 % (ref 0–0)
PLATELETS: 308 10*3/uL (ref 145–400)
RBC: 3.97 10*6/uL (ref 3.70–5.45)
RDW: 13.4 % (ref 11.2–14.5)
Retic %: 1.67 % (ref 0.70–2.10)
Retic Ct Abs: 66.3 10*3/uL (ref 33.70–90.70)
WBC: 9.1 10*3/uL (ref 3.9–10.3)
lymph#: 1.4 10*3/uL (ref 0.9–3.3)

## 2016-07-20 MED ORDER — CAPECITABINE 500 MG PO TABS: 825.0000 mg/m2 | ORAL_TABLET | Freq: Two times a day (BID) | ORAL | 1 refills | Status: DC

## 2016-07-20 NOTE — Telephone Encounter (Signed)
Gave patient avs report and appointments for July and August.  Central radiology will call re scans.

## 2016-07-21 ENCOUNTER — Telehealth: Payer: Self-pay | Admitting: Pharmacist

## 2016-07-21 DIAGNOSIS — C20 Malignant neoplasm of rectum: Secondary | ICD-10-CM

## 2016-07-21 LAB — IRON AND TIBC
%SAT: 13 % — AB (ref 21–57)
Iron: 39 ug/dL — ABNORMAL LOW (ref 41–142)
TIBC: 304 ug/dL (ref 236–444)
UIBC: 264 ug/dL (ref 120–384)

## 2016-07-21 LAB — FERRITIN: Ferritin: 26 ng/ml (ref 9–269)

## 2016-07-21 MED ORDER — CAPECITABINE 500 MG PO TABS: 825.0000 mg/m2 | ORAL_TABLET | Freq: Two times a day (BID) | ORAL | 0 refills | Status: DC

## 2016-07-21 NOTE — Telephone Encounter (Signed)
Oral Chemotherapy Pharmacist Encounter  Received new prescription for Xeloda for the neoadjuvant treatment of rectal cancer in conjunction with radiation treatment. RT has not yet been scheduled.  Labs reviewed, OK for treatment Current medication list in Epic assessed, no DDIs with Xeloda identified.  Prior authorization has been submitted on CoverMyMeds Key [id number] Status is pending  The prescriptions has been e-scribed to the [facility] outpatient pharmacy for benefit analysis and approval.   Oral Oncology Clinic will continue to follow for insurance authorization, copayment, initial counseling, and start date.  Thank you,  Johny Drilling, PharmD, BCPS, BCOP 07/21/2016  10:54 AM Oral Oncology Clinic [phone]

## 2016-07-22 ENCOUNTER — Encounter (HOSPITAL_COMMUNITY): Admission: RE | Payer: Self-pay | Source: Ambulatory Visit

## 2016-07-22 ENCOUNTER — Ambulatory Visit (HOSPITAL_COMMUNITY): Admission: RE | Admit: 2016-07-22 | Payer: Medicare HMO | Source: Ambulatory Visit | Admitting: Gastroenterology

## 2016-07-22 SURGERY — ULTRASOUND, LOWER GI TRACT, ENDOSCOPIC
Anesthesia: Moderate Sedation

## 2016-07-26 ENCOUNTER — Encounter: Payer: Self-pay | Admitting: Hematology

## 2016-07-26 ENCOUNTER — Telehealth: Payer: Self-pay | Admitting: Pharmacy Technician

## 2016-07-26 ENCOUNTER — Other Ambulatory Visit: Payer: Medicare HMO

## 2016-07-26 NOTE — Telephone Encounter (Signed)
Oral Oncology Patient Advocate Encounter  Prior Authorization for Xeloda has been approved.    PA# [id number] Effective dates: 07/20/2016 through 01/20/2017.  Oral Oncology Clinic will continue to follow.   Tonya [name]. Melynda Keller, [place] Oral Oncology Patient Advocate [phone] 07/26/2016 8:52 AM

## 2016-07-26 NOTE — Telephone Encounter (Signed)
Oral Chemotherapy Pharmacist Encounter   I spoke with patient for overview of new oral chemotherapy medication: Xeloda for the neoadjuvant treatment of rectal cancer in conjunction with 5 1/2 - 6 weeks of radiation treatment.   Pt is doing well. The prescription is being filled at the [facility]. Copayment $298.68 for entire course of treatment, patient states she is able to afford this copayment.   Counseled patient on administration, dosing, side effects, safe handling, and monitoring. Patient will take Xeloda 500mg  tablets, 3 tablets (1500mg ) by mouth in AM and 3 tablets (1500mg ) by mouth in PM within 30 minutes of finishing a meal. Patient will take her Xeloda on days of radiation only and is aware not to start Xeloda until day 1 of radiation. RT consult appointment scheduled for 07/28/16.  Side effects include but not limited to: GI upset, diarrhea, fatigue, and hand-foot syndrome.    Reviewed with patient importance of keeping a medication schedule and plan for any missed doses.  Tonya [name] voiced understanding and appreciation.   All questions answered.  Will follow up with patient regarding radiation and Xeloda start date.   Oral Oncology Clinic will continue to follow.  Thank you,  Tonya [name], PharmD, BCPS, BCOP 07/26/2016  11:46 AM Oral Oncology Clinic [phone]

## 2016-07-27 NOTE — Progress Notes (Signed)
GI Location of Tumor / Histology: Rectal Cancer  Tonya [name] presented  months ago with symptoms of: rectal bleeding for months  Biopsies of  (if applicable) revealed: Diagnosis 07/09/2016:Surgical [P], rectum mass - INVASIVE ADENOCARCINOMA  Past/Anticipated interventions by surgeon, if any: Dr.Dr. Henrene Pastor colonoscopy  With bx ZJ,0/96/43  Dr. Jilda Panda, MD 8/38/18 note,Consulted  Recommended complete work up, MRI  To evaluate liver,adrenal gland,and rectum,follow up after neoadjuvant therapy to discuss surgical resection. Gastric by-pass 2003,Cholecystectomy 1999, Past/Anticipated interventions by medical oncology, if any: Dr. Burr Medico, MD -referral for genetic testing for lynch syndrome ,nutritional consult, to begin Xeloda  Mon-Fri when radiation begins  Weight changes, if any: 30 lb weight los  Bowel/Bladder complaints, if any: excessive gas, bloody stools,,hemorrhoids, occasional incontinence  Nausea / Vomiting, if any:  No  Pain issues, if any:  Rectal pain  Any blood per rectum:   Yes, bloos in stool, o9n tissue and colors the toilet water stated daily  SAFETY ISSUES: NO  Prior radiation? NO  Pacemaker/ICD?  NO  Possible current pregnancy? NO  Is the patient on methotrexate?  No  Current Complaints/Details:HX open duodenal switch procedure, Menarche age 51 GOPO,no tobacco use, no alcohol or drug use, hx MI Mother Throat Cancer,HTN,Heart disease,Liver and kidney disease,  Father DM, 2 Sisters with  Uterine cancer,   Allergies: Bactrim,Phenergan,Sulfa antibiotics,PCNS  BP 132/86   Pulse 73   Temp 98.1 F (36.7 C) (Oral)   Resp 20   Ht 5' (1.524 m)   Wt 147 lb 9.6 oz (67 kg)   SpO2 100%   BMI 28.83 kg/m   Wt Readings from Last 3 Encounters:  07/28/16 147 lb 9.6 oz (67 kg)  07/28/16 147 lb 9.6 oz (67 kg)  07/20/16 148 lb 14.4 oz (67.5 kg)

## 2016-07-28 ENCOUNTER — Ambulatory Visit
Admission: RE | Admit: 2016-07-28 | Discharge: 2016-07-28 | Disposition: A | Discharge status: Home / Self Care | Payer: Medicare HMO | Source: Ambulatory Visit | Attending: Radiation Oncology | Admitting: Radiation Oncology

## 2016-07-28 ENCOUNTER — Other Ambulatory Visit: Payer: Self-pay | Admitting: Hematology

## 2016-07-28 ENCOUNTER — Encounter: Payer: Self-pay | Admitting: Radiation Oncology

## 2016-07-28 ENCOUNTER — Telehealth: Payer: Self-pay | Admitting: *Deleted

## 2016-07-28 ENCOUNTER — Telehealth: Payer: Self-pay | Admitting: Hematology

## 2016-07-28 ENCOUNTER — Telehealth: Payer: Self-pay | Admitting: Pharmacy Technician

## 2016-07-28 VITALS — BP 132/86 | HR 73 | Temp 98.1°F | Resp 20 | Ht 60.0 in | Wt 147.6 lb

## 2016-07-28 DIAGNOSIS — Z833 Family history of diabetes mellitus: Secondary | ICD-10-CM | POA: Insufficient documentation

## 2016-07-28 DIAGNOSIS — C20 Malignant neoplasm of rectum: Secondary | ICD-10-CM

## 2016-07-28 DIAGNOSIS — Z808 Family history of malignant neoplasm of other organs or systems: Secondary | ICD-10-CM | POA: Insufficient documentation

## 2016-07-28 DIAGNOSIS — Z8049 Family history of malignant neoplasm of other genital organs: Secondary | ICD-10-CM | POA: Diagnosis not present

## 2016-07-28 DIAGNOSIS — D500 Iron deficiency anemia secondary to blood loss (chronic): Secondary | ICD-10-CM | POA: Insufficient documentation

## 2016-07-28 DIAGNOSIS — Z510 Encounter for antineoplastic radiation therapy: Secondary | ICD-10-CM | POA: Diagnosis present

## 2016-07-28 DIAGNOSIS — Z809 Family history of malignant neoplasm, unspecified: Secondary | ICD-10-CM

## 2016-07-28 NOTE — Telephone Encounter (Signed)
Spoke with pt and informed pt re:  MRI Abdomen is scheduled at 5 pm,  MRI Pelvis is scheduled at 6 pm on  Mon  08/02/16.   Instructed pt to be  NPO for  4 Hours  Prior to  MRI.  Pt voiced understanding.

## 2016-07-28 NOTE — Progress Notes (Signed)
Radiation Oncology         [phone] ________________________________  Name: Tonya [name] MRN: [id number]  Date: 07/28/2016  DOB: [birth year]  QB:HALP, Marciano Sequin, MD  Irene Shipper, MD     REFERRING PHYSICIAN: Irene Shipper, MD   DIAGNOSIS: The encounter diagnosis was Rectal adenocarcinoma [facility]).   HISTORY OF PRESENT ILLNESS: Tonya [name] is a 66 y.o. female seen at the request of Dr. Henrene Pastor. The patient presented with complaints of rectal bleeding and iron deficiency anemia. The patient believed she just had hemorrhoids for several years. The patient underwent colonoscopy on 07/09/2016 with Dr. [name]. On exam, a non-obstructing large friable mass was found in the distal rectum. This began at approximately 1 cm above the anal verge and extended proximal for a distance of 8-10 cm. The lesion was bulky and involved approximately 75% of the luminal circumference. Biopsy of the rectal mass showed invasive adenocarcinoma. CEA on 07/09/2016 was 13.9.  CT C/A/P on 07/12/2016 revealed rectal wall thickening/mass, compatible with known rectal adenocarcinoma. There were no findings specific for metastatic disease in the chest, abdomen, or pelvis. 2.2 cm probable hemangioma in segment 4B of liver, although poorly evaluated. Bilateral adrenal nodules measuring up to 1.6 cm on the right, likely reflecting benign adrenal adenomas, although technically indeterminate.   The patient met with Dr. Burr Medico on 07/20/2016 and is scheduled for MRI Abdomen on 08/02/2016. She is not currently scheduled for MRI Pelvis. She has also met with Dr. Marcello Moores and will see her after treatment to discuss surgical options. She comes today to discuss options of radiotherapy.  PREVIOUS RADIATION THERAPY: No   PAST MEDICAL HISTORY:  Past Medical History:  Diagnosis Date  . Coronary artery disease 07/2003  . Myocardial infarction ([place])   . Obesity 2003   s/p gastric bypass        PAST SURGICAL HISTORY: Past  Surgical History:  Procedure Laterality Date  . CHOLECYSTECTOMY  1999  . GASTRIC BYPASS  2003     FAMILY HISTORY:  Family History  Problem Relation Age of Onset  . Heart attack Mother   . Throat cancer Mother   . Clotting disorder Mother   . Liver disease Mother   . Kidney disease Mother   . Heart attack Father   . Uterine cancer Sister   . Thyroid cancer Sister      SOCIAL HISTORY:  reports that she has never smoked. She has never used smokeless tobacco. She reports that she does not drink alcohol or use drugs. The patient is single and resides in [place]. She's a retired Radiographer, therapeutic.   ALLERGIES: Bactrim [sulfamethoxazole-trimethoprim]; Phenergan [promethazine hcl]; Sulfa antibiotics; and Penicillins   MEDICATIONS:  Current Outpatient Prescriptions  Medication Sig Dispense Refill  . Ibuprofen-Diphenhydramine Cit (ADVIL PM) 200-38 MG TABS Take 1 tablet by mouth daily after supper.    . capecitabine (XELODA) 500 MG tablet Take 3 tablets (1,500 mg total) by mouth 2 (two) times daily after a meal. Take on days of radiation only (Patient not taking: Reported on 07/28/2016) 168 tablet 0  . cholecalciferol (VITAMIN D) 1000 UNITS tablet Take 1,000 Units by mouth daily.      Marland Kitchen dicyclomine (BENTYL) 10 MG capsule Take 1 tab twice daily as needed for cramps. (Patient not taking: Reported on 07/28/2016) 30 capsule 1  . ferrous sulfate (FERROUSUL) 325 (65 FE) MG tablet Take 1 tablet (325 mg total) by mouth daily with breakfast. 30 tablet 6  . lidocaine (XYLOCAINE)  2 % jelly Apply 1 application topically 3 (three) times daily. As needed (Patient not taking: Reported on 07/28/2016) 30 mL 1  . Multiple Vitamin (MULTIVITAMIN) tablet Take 1 tablet by mouth daily.    . Vitamin D, Ergocalciferol, (DRISDOL) 50000 units CAPS capsule Take 1 capsule (50,000 Units total) by mouth every 7 (seven) days. 12 capsule 0   Current Facility-Administered Medications  Medication Dose Route Frequency Provider  Last Rate Last Dose  . 0.9 %  sodium chloride infusion  500 mL Intravenous Continuous Irene Shipper, MD         REVIEW OF SYSTEMS: On review of systems, the patient reports that she is doing well overall. She denies any chest pain, shortness of breath, cough, fevers, chills, night sweats. She reports 30 lbs weight loss. She reports excessive gas, bloody stools, hemorrhoids, and occasional incontinence. She reports frequent bowel movements and gas, with urgency. States, "I am in the bathroom a lot". She denies diarrhea. She denies abdominal pain, nausea or vomiting. She reports rectal pain. She reports blood in stool, on tissue, and colors in toilet water daily. She had bariatric surgery, duodenal switch which led to having frequent bowel movements, but her frequency has increased in the last few months. A complete review of systems is obtained and is otherwise negative.     PHYSICAL EXAM:  Wt Readings from Last 3 Encounters:  07/28/16 147 lb 9.6 oz (67 kg)  07/28/16 147 lb 9.6 oz (67 kg)  07/20/16 148 lb 14.4 oz (67.5 kg)   Temp Readings from Last 3 Encounters:  07/28/16 98.1 F (36.7 C) (Oral)  07/28/16 98.1 F (36.7 C) (Oral)  07/20/16 99.4 F (37.4 C) (Oral)   BP Readings from Last 3 Encounters:  07/28/16 132/86  07/28/16 132/86  07/20/16 132/78   Pulse Readings from Last 3 Encounters:  07/28/16 73  07/28/16 73  07/20/16 72   Pain Assessment Pain Score: 2  Pain Loc: Buttocks/10  In general this is a well appearing caucasian female in no acute distress. She is alert and oriented x4 and appropriate throughout the examination. HEENT reveals that the patient is normocephalic, atraumatic. EOMs are intact. PERRLA. Skin is intact without any evidence of gross lesions. Cardiovascular exam reveals a regular rate and rhythm, no clicks rubs or murmurs are auscultated. Chest is clear to auscultation bilaterally. Lymphatic assessment is performed and does not reveal any adenopathy in the  cervical, supraclavicular, axillary, or inguinal chains. Abdomen has normoactive bowel sounds in all quadrants and is intact. The abdomen is soft, non tender, non distended. Lower extremities are negative for pretibial pitting edema, deep calf tenderness, cyanosis or clubbing. Rectal exam reveals palpable fullness 1-2 cm from the anal verge. This is noted more on the right aspect of the posterior wall. No bleeding is noted on the examination glove.   ECOG = 1  0 - Asymptomatic (Fully active, able to carry on all predisease activities without restriction)  1 - Symptomatic but completely ambulatory (Restricted in physically strenuous activity but ambulatory and able to carry out work of a light or sedentary nature. For example, light housework, office work)  2 - Symptomatic, <50% in bed during the day (Ambulatory and capable of all self care but unable to carry out any work activities. Up and about more than 50% of waking hours)  3 - Symptomatic, >50% in bed, but not bedbound (Capable of only limited self-care, confined to bed or chair 50% or more of waking hours)  4 -  Bedbound (Completely disabled. Cannot carry on any self-care. Totally confined to bed or chair)  5 - Death   Eustace Pen MM, Creech RH, Tormey DC, et al. [phone]). "Toxicity and response criteria of the [facility]". [place] Oncol. 5 (6): 649-55    LABORATORY DATA:  Lab Results  Component Value Date   WBC 9.1 07/20/2016   HGB 11.7 07/20/2016   HCT 36.0 07/20/2016   MCV 90.7 07/20/2016   PLT 308 07/20/2016   Lab Results  Component Value Date   NA 143 07/09/2016   K 3.6 07/09/2016   CL 107 07/09/2016   CO2 25 07/09/2016   Lab Results  Component Value Date   ALT 32 07/09/2016   AST 30 07/09/2016   ALKPHOS 84 07/09/2016   BILITOT 0.4 07/09/2016      RADIOGRAPHY: Ct Chest W Contrast  Result Date: 07/12/2016 CLINICAL DATA:  Rectal mass, newly diagnosed rectal cancer, evaluate for metastatic  disease EXAM: CT CHEST, ABDOMEN, AND PELVIS WITH CONTRAST TECHNIQUE: Multidetector CT imaging of the chest, abdomen and pelvis was performed following the standard protocol during bolus administration of intravenous contrast. CONTRAST:  123m ISOVUE-300 IOPAMIDOL (ISOVUE-300) INJECTION 61% COMPARISON:  None. FINDINGS: CT CHEST FINDINGS Cardiovascular: The heart is normal in size. No pericardial effusion. Mild coronary atherosclerosis in the LAD and right coronary artery. No evidence of thoracic aortic aneurysm. Mild atherosclerotic calcifications of the aortic arch. Mediastinum/Nodes: No suspicious mediastinal lymphadenopathy. Visualized thyroid is unremarkable. Lungs/Pleura: Lungs are clear. No suspicious pulmonary nodules. No focal consolidation. No pleural effusion or pneumothorax. Musculoskeletal: Degenerative changes of the thoracic spine. CT ABDOMEN PELVIS FINDINGS Motion degraded images. Hepatobiliary: 2.2 cm probable hemangioma in segment 4B (series 2/image 55), although poorly evaluated. Additional 5 mm probable cyst in segment 3 (series 2/ image 54). Status post cholecystectomy. No intrahepatic or extrahepatic ductal dilatation. Pancreas: Within normal limits. Spleen: Within normal limits. Adrenals/Urinary Tract: 1.6 cm right adrenal nodule (series 2/image 51). 11 mm left adrenal nodule (series 2/ image 529. These are technically indeterminate but likely reflect small benign adrenal adenomas. 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 Two nonobstructing right renal calculi measuring 2-3 mm. Left kidney is within normal limits. No hydronephrosis. Bladder is within normal limits. Stomach/Bowel: Stomach is notable for postsurgical changes related to gastric bypass. No evidence of bowel obstruction. Normal appendix (series 2/ image 86). Rectal wall thickening/ mass (sagittal image 78), compatible with known rectal adenocarcinoma. Vascular/Lymphatic: No  evidence of abdominal aortic aneurysm. No suspicious abdominopelvic lymphadenopathy. Specifically, no suspicious perirectal lymphadenopathy. Reproductive: Uterus is within normal limits. Bilateral ovaries are within normal limits. Other: No abdominopelvic ascites. Musculoskeletal: Mild degenerative changes of the lumbar spine. IMPRESSION: Rectal wall thickening/mass, compatible with known rectal adenocarcinoma. No findings specific for metastatic disease in the chest, abdomen, or pelvis. 2.2 cm probable hemangioma in segment 4B, although poorly evaluated. Consider MRI abdomen with/ without multihance contrast for definitive characterization, as clinically warranted. Bilateral adrenal nodules measuring up to 1.6 cm on the right, likely reflecting benign adrenal adenomas, although technically indeterminate. If MRI abdomen is performed, these can be definitively characterized at that time. Electronically Signed   By: SJulian HyM.D.   On: 07/12/2016 17:04   Ct Abdomen Pelvis W Contrast  Result Date: 07/12/2016 CLINICAL DATA:  Rectal mass, newly diagnosed rectal cancer, evaluate for metastatic disease EXAM: CT CHEST, ABDOMEN, AND PELVIS WITH CONTRAST TECHNIQUE: Multidetector CT imaging of the chest, abdomen and pelvis  was performed following the standard protocol during bolus administration of intravenous contrast. CONTRAST:  162m ISOVUE-300 IOPAMIDOL (ISOVUE-300) INJECTION 61% COMPARISON:  None. FINDINGS: CT CHEST FINDINGS Cardiovascular: The heart is normal in size. No pericardial effusion. Mild coronary atherosclerosis in the LAD and right coronary artery. No evidence of thoracic aortic aneurysm. Mild atherosclerotic calcifications of the aortic arch. Mediastinum/Nodes: No suspicious mediastinal lymphadenopathy. Visualized thyroid is unremarkable. Lungs/Pleura: Lungs are clear. No suspicious pulmonary nodules. No focal consolidation. No pleural effusion or pneumothorax. Musculoskeletal: Degenerative  changes of the thoracic spine. CT ABDOMEN PELVIS FINDINGS Motion degraded images. Hepatobiliary: 2.2 cm probable hemangioma in segment 4B (series 2/image 55), although poorly evaluated. Additional 5 mm probable cyst in segment 3 (series 2/ image 54). Status post cholecystectomy. No intrahepatic or extrahepatic ductal dilatation. Pancreas: Within normal limits. Spleen: Within normal limits. Adrenals/Urinary Tract: 1.6 cm right adrenal nodule (series 2/image 51). 11 mm left adrenal nodule (series 2/ image 591. These are technically indeterminate but likely reflect small benign adrenal adenomas. 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 Two nonobstructing right renal calculi measuring 2-3 mm. Left kidney is within normal limits. No hydronephrosis. Bladder is within normal limits. Stomach/Bowel: Stomach is notable for postsurgical changes related to gastric bypass. No evidence of bowel obstruction. Normal appendix (series 2/ image 86). Rectal wall thickening/ mass (sagittal image 78), compatible with known rectal adenocarcinoma. Vascular/Lymphatic: No evidence of abdominal aortic aneurysm. No suspicious abdominopelvic lymphadenopathy. Specifically, no suspicious perirectal lymphadenopathy. Reproductive: Uterus is within normal limits. Bilateral ovaries are within normal limits. Other: No abdominopelvic ascites. Musculoskeletal: Mild degenerative changes of the lumbar spine. IMPRESSION: Rectal wall thickening/mass, compatible with known rectal adenocarcinoma. No findings specific for metastatic disease in the chest, abdomen, or pelvis. 2.2 cm probable hemangioma in segment 4B, although poorly evaluated. Consider MRI abdomen with/ without multihance contrast for definitive characterization, as clinically warranted. Bilateral adrenal nodules measuring up to 1.6 cm on the right, likely reflecting benign adrenal adenomas, although technically indeterminate. If MRI abdomen  is performed, these can be definitively characterized at that time. Electronically Signed   By: SJulian HyM.D.   On: 07/12/2016 17:04       IMPRESSION/PLAN: 1. Unstaged, invasive adenocarcinoma of the distal rectum. Dr. MLisbeth Renshawdiscusses the findings and work-up thus far. Dr. MLisbeth Renshawdiscusses the pathology findings and reviews the nature of rectal disease. If her metastatic work up with MRI does not reveal concerns for disease outside of the rectum, we would recommend chemoRT. We discussed the risks, benefits, short, and long term effects of radiotherapy, and the patient is interested in proceeding. Dr. MLisbeth Renshawdiscusses the delivery and logistics of radiotherapy, and would recommend a course of 6 weeks. The patient would like to proceed with radiation if appropriate, and will return Tuesday, 7/31, for CT simulation. Written consent is obtained and placed in the chart, a copy was provided to the patient. 2. Frequent, sometimes loose stool. I discussed options to bulk the stool with the patient with OTC  Metamucil, particularly during treatment. She will consider this. 4. 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 The patient will be referred to genetics based on personal and family history. 5. Anemia secondary to chronic blood loss due to #1. The patient will continue to have this followed and her most recent hgb was 11.7 on 07/20/16. We will follow this along with Dr. FBurr Medico  The above documentation reflects my direct findings during this shared patient visit. Please see the separate note by  Dr. [name] on this date for the remainder of the patient's plan of care.    Carola Rhine, PAC  This document serves as a record of services personally performed by Kyung Rudd, MD and Shona Simpson, PA-C. It was created on their behalf by Arlyce Harman, a trained medical scribe. The creation of this record is based on the scribe's personal observations and the provider's statements to them. This document has been  checked and approved by the attending provider.

## 2016-07-28 NOTE — Progress Notes (Signed)
Please see the Nurse Progress Note in the MD Initial Consult Encounter for this patient. 

## 2016-07-28 NOTE — Telephone Encounter (Signed)
Oral Oncology Patient Advocate Encounter  Was successful in securing patient an $69 grant from Patient [facility]) to provide copayment coverage for her Xeloda.  This will keep the out of pocket expense at $0.    I have spoken with the patient.  She is appreciative and understanding that this will cover her copayment expenses until the eligibility period ends or the funds are exhausted.   The billing information is as follows and has been shared with [place].   Member ID: [id number] Group ID: [id number] RxBin: [id number] Dates of Eligibility: 04/29/16 through 07/27/2017  Fabio Asa. Melynda Keller, [place] Oral Oncology Patient Advocate [phone] 07/28/2016 8:47 AM

## 2016-07-28 NOTE — Telephone Encounter (Signed)
sw pt to confirm appt time change 8/1 to 1130 per sch msg and desk RN

## 2016-07-28 NOTE — Telephone Encounter (Signed)
Spoke with pt and informed pt of iron results as per Dr. Ernestina Penna instructions.  Schedule message sent for First time Feraheme infusion.   Pt voiced understanding.

## 2016-07-28 NOTE — Telephone Encounter (Signed)
-----   Message from Truitt Merle, MD sent at 07/27/2016 11:02 PM EDT ----- Please let pt know the iron study result, which showed low serum iron and saturation. I recommend IV feraheme on her next visit with [place] on 8/1, please set up the appointment for her, thanks  Truitt Merle  07/27/2016

## 2016-07-29 ENCOUNTER — Encounter: Payer: Self-pay | Admitting: Genetics

## 2016-07-29 ENCOUNTER — Ambulatory Visit (HOSPITAL_BASED_OUTPATIENT_CLINIC_OR_DEPARTMENT_OTHER): Payer: Medicare HMO | Admitting: Genetics

## 2016-07-29 ENCOUNTER — Other Ambulatory Visit: Payer: Medicare HMO

## 2016-07-29 ENCOUNTER — Ambulatory Visit: Payer: Medicare HMO | Admitting: Radiation Oncology

## 2016-07-29 DIAGNOSIS — C20 Malignant neoplasm of rectum: Secondary | ICD-10-CM | POA: Diagnosis not present

## 2016-07-29 DIAGNOSIS — Z800 Family history of malignant neoplasm of digestive organs: Secondary | ICD-10-CM

## 2016-07-29 DIAGNOSIS — Z8042 Family history of malignant neoplasm of prostate: Secondary | ICD-10-CM | POA: Diagnosis not present

## 2016-07-29 DIAGNOSIS — Z808 Family history of malignant neoplasm of other organs or systems: Secondary | ICD-10-CM | POA: Diagnosis not present

## 2016-07-29 DIAGNOSIS — Z315 Encounter for genetic counseling: Secondary | ICD-10-CM | POA: Diagnosis not present

## 2016-07-29 DIAGNOSIS — Z8049 Family history of malignant neoplasm of other genital organs: Secondary | ICD-10-CM

## 2016-07-29 NOTE — Progress Notes (Signed)
REFERRING PROVIDER: Truitt Merle, MD [address] [place], [place] [address]  PRIMARY PROVIDER:  Lucille Passy, MD  PRIMARY REASON FOR VISIT:  1. Rectal cancer ([place])   2. Family history of uterine cancer   3. Family history of thyroid cancer   4. Family history of colon cancer   5. Family history of prostate cancer     HISTORY OF PRESENT ILLNESS:   Tonya [name], a 66 y.o. female, was seen for a [place] cancer genetics consultation at the request of Dr. Burr Medico due to a personal and family history of cancer.  Tonya [name] presents to clinic today to discuss the possibility of a hereditary predisposition to cancer, genetic testing, and to further clarify her future cancer risks, as well as potential cancer risks for family members.   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 Her treatment is pending. She reports that she plans neoadjuvant chemo/radiation, surgery, and adjuvant chemo.   CANCER HISTORY:    Rectal adenocarcinoma ([place])   07/09/2016 Pathology Results    Diagnosis Surgical [P], rectum mass - INVASIVE ADENOCARCINOMA. - SEE COMMENT.      07/09/2016 Procedure    Colonoscopy A non-obstructing large friable mass was found in the distal rectum. This began at approximately 1 cm above the anal verge and extended proximal for a distance of 8-10 cm. The lesion was bulky and involved approximately 75% of the luminal circumference. Multiple biopsies were taken. A few small-mouthed diverticula were found in the left colon. The exam was otherwise without abnormality on direct views. Retroflexion not performed purposely.      07/12/2016 Imaging    CT C/A/P with contrast IMPRESSION: Rectal wall thickening/mass, compatible with known rectal adenocarcinoma. No findings specific for metastatic disease in the chest, abdomen, or pelvis. 2.2 cm probable hemangioma in segment 4B, although poorly evaluated. Consider MRI abdomen with/ without  multihance contrast for definitive characterization, as clinically warranted. Bilateral adrenal nodules measuring up to 1.6 cm on the right, likely reflecting benign adrenal adenomas, although technically indeterminate. If MRI abdomen is performed, these can be definitively characterized at that time.      07/20/2016 Initial Diagnosis    Rectal adenocarcinoma ([place])      Past Medical History:  Diagnosis Date  . Coronary artery disease 07/2003  . Myocardial infarction ([place])   . Obesity 2003   s/p gastric bypass     Past Surgical History:  Procedure Laterality Date  . CHOLECYSTECTOMY  1999  . GASTRIC BYPASS  2003    Social History   Social History  . Marital status: Single    Spouse name: N/A  . Number of children: 0  . Years of education: N/A   Occupational History  . retired    Social History Main Topics  . Smoking status: Never Smoker  . Smokeless tobacco: Never Used  . Alcohol use No  . Drug use: No  . Sexual activity: Not on file   Other Topics Concern  . Not on file   Social History Narrative  . No narrative on file     FAMILY HISTORY:  We obtained a detailed, 4-generation family history.  Significant diagnoses are listed below: Family History  Problem Relation Age of Onset  . Heart attack Mother        d.93  . Throat cancer Mother 53  . Clotting disorder Mother   . Liver disease Mother   . Kidney disease Mother   .  Heart attack Father        d.92  . Prostate cancer Father 46  . Uterine cancer Sister 19       treated with total hysterectomy and radiation  . Thyroid cancer Sister 70       treated with thyroidectomy  . Cancer Maternal Uncle        d.80s unspecified type of cancer. History of smoking.  Marland Kitchen Uterine cancer Sister 41  . Colon cancer Cousin 81       paternal first-cousin   Tonya [name] has no children. She has five full sisters. Two sisters, Gerald Stabs and Jan, have histories of uterine cancer in their 31s. Gerald Stabs is currently 62 and Jan is  49. Another sister, Leda Gauze, had thyroid cancer at 75 and is currently 23. The remaining two sisters, Shauna Hugh and Alinda Sierras, are 99 and 11 and have no personal history of cancer.  Tonya [name] mother was diagnosed with throat cancer in her mid-14s. Tonya [name] reports her mother's diagnosis lead to removal of her jaw and tongue. Her mother died at the age of 53 from causes unrelated to cancer. Tonya [name] had three maternal uncles and one maternal aunt. One uncle died in his 68s with an unspecified form of cancer and had a history of smoking. The other two uncles died at 60 and 88; one from alcohol abuse and the other rheumatic fever. Tonya [name] aunt died in her 46s without cancer. There are no known cancers in her maternal grandparents.  Tonya [name] father had prostate cancer at 61 and died at 16 from unrelated causes. Tonya [name] had two paternal uncles and one paternal aunt. Her aunt died at age 52 with an unspecified form of cancer. This aunt had a son who had colon cancer in his 73s and is currently doing well at age 40. Tonya [name] uncles died at 7 and 6 without cancers. There are no known cancers in her paternal grandparents.  Tonya [name] is unaware of previous family history of genetic testing for hereditary cancer risks. Patient's maternal ancestors are of [place] descent, and paternal ancestors are of [place] descent. There is no reported Ashkenazi Jewish ancestry. There is no known consanguinity.  GENETIC COUNSELING ASSESSMENT: Tonya [name] is a 66 y.o. female with a personal and family history that are somewhat suggestive of a hereditary cancer syndrome, such as Lynch syndrome, and predisposition to cancer. We, therefore, discussed and recommended the following at today's visit.   DISCUSSION: We reviewed the characteristics, features and inheritance patterns of hereditary cancer syndromes. We also discussed genetic testing, including the appropriate family  members to test, the process of testing, insurance coverage and turn-around-time for results. We discussed the implications of a negative, positive and/or variant of uncertain significant result. We recommended Tonya [name] pursue genetic testing for the [facility]'s Common Hereditary Cancer Panel + [facility]'s Thyroid Cancer Panel. This custom panel would include analysis of the following 49 genes: APC, ATM, AXIN2, BARD1, BMPR1A, BRCA1, BRCA2, BRIP1, CDH1, CDKN2A, CHEK2, CTNNA1, DICER1, EPCAM, GREM1, HOXB13, KIT, MEN1, MLH1, MSH2, MSH3, MSH6, MUTYH, NBN, NF1, NTHL1, PALB2, PDGFRA, PMS2, POLD1, POLE, PRKAR1A, PTEN, RAD50, RAD51C, RAD51D, RET, SDHA, SDHB, SDHC, SDHD, SMAD4, SMARCA4, STK11, TP53, TSC1, TSC2, VHL, and WRN.  Based on Tonya [name] personal and family history of cancer, she meets medical criteria for genetic testing. Despite that she meets criteria, she may still have an out of pocket cost. We discussed that if her out of pocket cost for testing is over $100, the  laboratory will call and confirm whether she wants to proceed with testing.  If the out of pocket cost of testing is less than $100 she will be billed by the genetic testing laboratory.   PLAN: Despite our recommendation, Tonya [name] did not wish to pursue genetic testing at today's visit. Tonya [name] expressed interest in testing to provide information to sisters and nieces and nephews. She was not sure if she would use the information for own care, but did not rule it out. Ultimately, she is hesitant to pursue testing due to the limitations of GINA. She would like to continue considering her options and will contact us if she has questions or desires testing in the future. We understand this decision, and remain available to coordinate genetic testing at any time in the future. Ms. Pugsley was advised to continue to follow the cancer treatment and screening guidelines given by her oncology team and primary care providers.  Based  on Ms. Recinos's family history, we recommended her sisters, Gerald Stabs and Jan, who were diagnosed with uterine cancer in their 27s, have genetic counseling and testing. Ms. Steppe plans to discuss genetic testing with her sisters and will let us know if we can be of any assistance in coordinating genetic counseling and/or testing for these family members.   Lastly, we encouraged Ms. Rahimi to remain in contact with cancer genetics annually so that we can continuously update the family history and inform her of any changes in cancer genetics and testing that may be of benefit for this family.   Ms.  Jeune questions were answered to her satisfaction today. Our contact information was provided should additional questions or concerns arise. Thank you for the referral and allowing [place] to share in the care of your patient.   Mal Misty, MS, [facility] Certified Naval [email or web address] phone: [phone]  The patient was seen for a total of 35 minutes in face-to-face genetic counseling.  This patient was discussed with Drs. Magrinat, Lindi Adie and/or Burr Medico who agrees with the above.    _______________________________________________________________________ For Office Staff:  Number of people involved in session: 1 Was an Intern/ student involved with case: no

## 2016-08-02 ENCOUNTER — Ambulatory Visit (HOSPITAL_COMMUNITY)
Admission: RE | Admit: 2016-08-02 | Discharge: 2016-08-02 | Disposition: A | Discharge status: Home / Self Care | Payer: Medicare HMO | Source: Ambulatory Visit | Attending: Hematology | Admitting: Hematology

## 2016-08-02 DIAGNOSIS — C20 Malignant neoplasm of rectum: Secondary | ICD-10-CM

## 2016-08-02 DIAGNOSIS — D3502 Benign neoplasm of left adrenal gland: Secondary | ICD-10-CM | POA: Diagnosis not present

## 2016-08-02 DIAGNOSIS — K7689 Other specified diseases of liver: Secondary | ICD-10-CM | POA: Insufficient documentation

## 2016-08-02 DIAGNOSIS — D3501 Benign neoplasm of right adrenal gland: Secondary | ICD-10-CM | POA: Diagnosis not present

## 2016-08-02 MED ORDER — GADOBENATE DIMEGLUMINE 529 MG/ML IV SOLN
15.0000 mL | Freq: Once | INTRAVENOUS | Status: AC | PRN
  Administered 2016-08-02: 14 mL via INTRAVENOUS

## 2016-08-03 ENCOUNTER — Ambulatory Visit
Admission: RE | Admit: 2016-08-03 | Discharge: 2016-08-03 | Disposition: A | Discharge status: Home / Self Care | Payer: Medicare HMO | Source: Ambulatory Visit | Attending: Radiation Oncology | Admitting: Radiation Oncology

## 2016-08-03 DIAGNOSIS — Z510 Encounter for antineoplastic radiation therapy: Secondary | ICD-10-CM | POA: Diagnosis not present

## 2016-08-03 DIAGNOSIS — Z808 Family history of malignant neoplasm of other organs or systems: Secondary | ICD-10-CM | POA: Diagnosis not present

## 2016-08-03 DIAGNOSIS — C20 Malignant neoplasm of rectum: Secondary | ICD-10-CM | POA: Diagnosis not present

## 2016-08-03 DIAGNOSIS — Z833 Family history of diabetes mellitus: Secondary | ICD-10-CM | POA: Diagnosis not present

## 2016-08-03 DIAGNOSIS — D500 Iron deficiency anemia secondary to blood loss (chronic): Secondary | ICD-10-CM | POA: Diagnosis not present

## 2016-08-03 DIAGNOSIS — R69 Illness, unspecified: Secondary | ICD-10-CM | POA: Diagnosis not present

## 2016-08-03 MED FILL — XELODA 500 MG TABLET: 500 | 28 days supply | Qty: 120 | Fill #0

## 2016-08-03 NOTE — Progress Notes (Signed)
  Radiation Oncology         [phone] ________________________________  Name: Tonya [name] MRN: [id number]  Date: 08/03/2016  DOB: 09/07/1950   SIMULATION AND TREATMENT PLANNING NOTE  DIAGNOSIS:     ICD-10-CM   1. Rectal adenocarcinoma ([place]) C20      The patient presented for simulation for the patient's upcoming course of radiation for the diagnosis of rectal cancer. The patient was placed in a supine position. A customized vac-lock bag was constructed to aid in patient immobilization on. This complex treatment device will be used on a daily basis during the treatment. In this fashion a CT scan was obtained through the pelvic region and the isocenter was placed near midline within the pelvis. Surface markings were placed.  The patient's imaging was loaded into the radiation treatment planning system. The patient will initially be planned to receive a course of radiation to a dose of 45 Gy. This will be accomplished in 25 fractions at 1.8 gray per fraction. This initial treatment will correspond to a 3-D conformal technique. The target has been contoured in addition to the rectum, bladder and femoral heads. Dose volume histograms of each of these structures have been requested and these will be carefully reviewed as part of the 3-D conformal treatment planning process. To accomplish this initial treatment, 4 customized blocks have been designed for this purpose. Each of these 4 complex treatment devices will be used on a daily basis during the initial course of the treatment. It is anticipated that the patient will then receive a boost for an additional 5.4 Gy. The anticipated total dose therefore will be 50.4 Gy.    Special treatment procedure The patient will receive chemotherapy during the course of radiation treatment. The patient may experience increased or overlapping toxicity due to this combined-modality approach and the patient will be monitored for such problems. This may  include extra lab work as necessary. This therefore constitutes a special treatment procedure.    ________________________________  Jodelle Gross, MD, PhD

## 2016-08-03 NOTE — Progress Notes (Signed)
  Radiation Oncology         [phone]) [phone] ________________________________  Name: Tonya [name] MRN: [id number]  Date: 08/03/2016  DOB: 1950-05-30  Optical Surface Tracking Plan:  Since intensity modulated radiotherapy (IMRT) and 3D conformal radiation treatment methods are predicated on accurate and precise positioning for treatment, intrafraction motion monitoring is medically necessary to ensure accurate and safe treatment delivery.  The ability to quantify intrafraction motion without excessive ionizing radiation dose can only be performed with optical surface tracking. Accordingly, surface imaging offers the opportunity to obtain 3D measurements of patient position throughout IMRT and 3D treatments without excessive radiation exposure.  I am ordering optical surface tracking for this patient's upcoming course of radiotherapy. ________________________________  Kyung Rudd, MD 08/03/2016 5:45 PM    Reference:   Ursula Alert, J, et al. Surface imaging-based analysis of intrafraction motion for breast radiotherapy patients.Journal of [place], n. 6, nov. 2014. ISSN [id number].   Available at: <http://www.jacmp.org/index.php/jacmp/article/view/4957>.

## 2016-08-04 ENCOUNTER — Other Ambulatory Visit (HOSPITAL_BASED_OUTPATIENT_CLINIC_OR_DEPARTMENT_OTHER): Payer: Medicare HMO

## 2016-08-04 ENCOUNTER — Ambulatory Visit (HOSPITAL_BASED_OUTPATIENT_CLINIC_OR_DEPARTMENT_OTHER): Payer: Medicare HMO

## 2016-08-04 ENCOUNTER — Ambulatory Visit (HOSPITAL_BASED_OUTPATIENT_CLINIC_OR_DEPARTMENT_OTHER): Payer: Medicare HMO | Admitting: Hematology

## 2016-08-04 ENCOUNTER — Encounter: Payer: Self-pay | Admitting: Hematology

## 2016-08-04 VITALS — BP 123/60 | HR 65 | Temp 98.3°F | Resp 18

## 2016-08-04 DIAGNOSIS — D500 Iron deficiency anemia secondary to blood loss (chronic): Secondary | ICD-10-CM

## 2016-08-04 DIAGNOSIS — C20 Malignant neoplasm of rectum: Secondary | ICD-10-CM

## 2016-08-04 LAB — COMPREHENSIVE METABOLIC PANEL
ALBUMIN: 3.1 g/dL — AB (ref 3.5–5.0)
ALK PHOS: 82 U/L (ref 40–150)
ALT: 28 U/L (ref 0–55)
ANION GAP: 10 meq/L (ref 3–11)
AST: 21 U/L (ref 5–34)
BILIRUBIN TOTAL: 0.28 mg/dL (ref 0.20–1.20)
BUN: 20.9 mg/dL (ref 7.0–26.0)
CO2: 22 mEq/L (ref 22–29)
CREATININE: 0.6 mg/dL (ref 0.6–1.1)
Calcium: 9.3 mg/dL (ref 8.4–10.4)
Chloride: 111 mEq/L — ABNORMAL HIGH (ref 98–109)
Glucose: 83 mg/dl (ref 70–140)
Potassium: 3.6 mEq/L (ref 3.5–5.1)
Sodium: 143 mEq/L (ref 136–145)
TOTAL PROTEIN: 6 g/dL — AB (ref 6.4–8.3)

## 2016-08-04 LAB — CBC & DIFF AND RETIC
BASO%: 0.1 % (ref 0.0–2.0)
Basophils Absolute: 0 10*3/uL (ref 0.0–0.1)
EOS ABS: 0.1 10*3/uL (ref 0.0–0.5)
EOS%: 0.8 % (ref 0.0–7.0)
HCT: 37.7 % (ref 34.8–46.6)
HGB: 12 g/dL (ref 11.6–15.9)
Immature Retic Fract: 10.9 % — ABNORMAL HIGH (ref 1.60–10.00)
LYMPH%: 12.5 % — ABNORMAL LOW (ref 14.0–49.7)
MCH: 29.1 pg (ref 25.1–34.0)
MCHC: 31.8 g/dL (ref 31.5–36.0)
MCV: 91.5 fL (ref 79.5–101.0)
MONO#: 0.6 10*3/uL (ref 0.1–0.9)
MONO%: 7.4 % (ref 0.0–14.0)
NEUT%: 79.2 % — ABNORMAL HIGH (ref 38.4–76.8)
NEUTROS ABS: 6.1 10*3/uL (ref 1.5–6.5)
Platelets: 246 10*3/uL (ref 145–400)
RBC: 4.12 10*6/uL (ref 3.70–5.45)
RDW: 13.6 % (ref 11.2–14.5)
RETIC %: 1.72 % (ref 0.70–2.10)
Retic Ct Abs: 70.86 10*3/uL (ref 33.70–90.70)
WBC: 7.7 10*3/uL (ref 3.9–10.3)
lymph#: 1 10*3/uL (ref 0.9–3.3)

## 2016-08-04 LAB — IRON AND TIBC
%SAT: 32 % (ref 21–57)
IRON: 97 ug/dL (ref 41–142)
TIBC: 301 ug/dL (ref 236–444)
UIBC: 205 ug/dL (ref 120–384)

## 2016-08-04 LAB — FERRITIN: FERRITIN: 16 ng/mL (ref 9–269)

## 2016-08-04 MED ORDER — SODIUM CHLORIDE 0.9 % IV SOLN
Freq: Once | INTRAVENOUS | Status: AC
  Administered 2016-08-04: 12:00:00 via INTRAVENOUS

## 2016-08-04 MED ORDER — FERUMOXYTOL INJECTION 510 MG/17 ML
510.0000 mg | Freq: Once | INTRAVENOUS | Status: AC
  Administered 2016-08-04: 510 mg via INTRAVENOUS
  Filled 2016-08-04: qty 17

## 2016-08-04 NOTE — Patient Instructions (Signed)

## 2016-08-04 NOTE — Progress Notes (Signed)
Tonya [name]  [phone] [phone]  Clinic Follow Up Note   Patient Care Team: Lucille Passy, MD as PCP - General (Family Medicine) Irene Shipper, MD as Consulting Physician (Gastroenterology) Leighton Ruff, MD as Consulting Physician (General Surgery) Truitt Merle, MD as Consulting Physician (Hematology) Kyung Rudd, MD as Consulting Physician (Radiation Oncology) 08/04/2016  CHIEF COMPLAINTS:  Follow up rectal Adenocarcinoma    Rectal adenocarcinoma ([place])   07/09/2016 Pathology Results    Diagnosis Surgical [P], rectum mass - INVASIVE ADENOCARCINOMA. - SEE COMMENT.      07/09/2016 Procedure    Colonoscopy A non-obstructing large friable mass was found in the distal rectum. This began at approximately 1 cm above the anal verge and extended proximal for a distance of 8-10 cm. The lesion was bulky and involved approximately 75% of the luminal circumference. Multiple biopsies were taken. A few small-mouthed diverticula were found in the left colon. The exam was otherwise without abnormality on direct views. Retroflexion not performed purposely.      07/12/2016 Imaging    CT C/A/P with contrast IMPRESSION: Rectal wall thickening/mass, compatible with known rectal adenocarcinoma. No findings specific for metastatic disease in the chest, abdomen, or pelvis. 2.2 cm probable hemangioma in segment 4B, although poorly evaluated. Consider MRI abdomen with/ without multihance contrast for definitive characterization, as clinically warranted. Bilateral adrenal nodules measuring up to 1.6 cm on the right, likely reflecting benign adrenal adenomas, although technically indeterminate. If MRI abdomen is performed, these can be definitively characterized at that time.      07/20/2016 Initial Diagnosis    Rectal adenocarcinoma ([place])     08/03/2016 Imaging    MRI AP W WO Contrast 08/03/16 IMPRESSION: 1. Large rectal mass measures 6.1 cm in length. No  obstruction identified. This is compatible with at least a T3bN0M0 lesion. 2. No specific features highly suspicious for nodal metastasis or metastatic disease to the upper abdomen. 3. Indeterminate arterial phase enhancing lesion within the anterior dome of liver measures less than 1 cm. This may represent a benign liver lesions such as FNH or adenoma. Less favored with the hypervascular liver metastasis. Followup imaging at 6 months with repeat MRI of the liver is advise. 4. Bilateral adrenal adenomas       HISTORY OF PRESENTING ILLNESS: 07/20/16 Tonya [name] 66 y.o. female is here because of recently discovered invasive adenocarcinoma. She is accompanied by a friend today. The patient underwent colonoscopy on 07/09/16 with Dr. [name]. According to Dr. Blanch Media note from that encounter, the patient presented with complaints of rectal bleeding and iron deficiency anemia. She reports she was told she has hemorrhoids and initially was not concerned about the rectal bleeding. Her most recent prior colonoscopy was 2005. On exam, Dr. Henrene Pastor noted a non-obstructing large friable mass was found in the distal rectum. This began at approximately 1 cm above the anal verge and extended proximal for a distance of 8-10 cm. The lesion was bulky and involved approximately 75% of the luminal circumference. Biopsy of the rectal mass on 07/09/16 showed invasive adenocarcinoma.  CT C/A/P on 07/12/16 revealed rectal wall thickening/mass, compatible with known rectal adenocarcinoma.No findings specific for metastatic disease in the chest, abdomen, or pelvis. 2.2 cm probable hemangioma in segment 4B, although poorly evaluated. Consider MRI abdomen with/ without multihance contrast for definitive characterization, as clinically warranted. Bilateral adrenal nodules measuring up to 1.6 cm on the right, likely reflecting benign adrenal adenomas, although technically indeterminate. If MRI abdomen is performed,  these can  be definitively characterized at that time.  On 07/19/16 the patient consulted with Dr. Leighton Ruff of [facility]. Per her note, she recommends complete metastatic workup with MRI to evaluate liver, adrenal gland, and rectum. The patient will follow up with Dr. Marcello Moores following neoadjuvant therapy to discuss surgical resection.  The patient reports she has lost 30 lbs since January. She reports ongoing fatigue, which her PCP related to iron deficiency and vitamin D deficiency. She reports occasional incontinence, and irregular bowel activity. She reports ongoing bright red rectal bleeding. She denies cramps or pain. She takes 2 iron pills daily.  CURRENT THERAPY: Concurrent chemoradiation with Xeloda, 1500mg  twice daily Mon-Fri started 08/04/16  INTERVAL HISTORY: Tonya [name]  Is here for a follow-up. She presents in the infusion room. She picked up her Xeloda today. She also wanted paperwork for her to cancel her flight. Overall she is ready for treatment. She denies any other new symptoms.  MEDICAL HISTORY:  Past Medical History:  Diagnosis Date  . Coronary artery disease 07/2003  . Myocardial infarction ([place])   . Obesity 2003   s/p gastric bypass     SURGICAL HISTORY: Past Surgical History:  Procedure Laterality Date  . CHOLECYSTECTOMY  1999  . GASTRIC BYPASS  2003    SOCIAL HISTORY: Social History   Social History  . Marital status: Single    Spouse name: N/A  . Number of children: 0  . Years of education: N/A   Occupational History  . retired    Social History Main Topics  . Smoking status: Never Smoker  . Smokeless tobacco: Never Used  . Alcohol use No  . Drug use: No  . Sexual activity: Not on file   Other Topics Concern  . Not on file   Social History Narrative  . No narrative on file    FAMILY HISTORY: Family History  Problem Relation Age of Onset  . Heart attack Mother        d.93  . Throat cancer Mother 71  . Clotting disorder  Mother   . Liver disease Mother   . Kidney disease Mother   . Heart attack Father        d.92  . Prostate cancer Father 58  . Uterine cancer Sister 78       treated with total hysterectomy and radiation  . Thyroid cancer Sister 74       treated with thyroidectomy  . Cancer Maternal Uncle        d.80s unspecified type of cancer. History of smoking.  Marland Kitchen Uterine cancer Sister 27  . Colon cancer Cousin 40       paternal first-cousin    ALLERGIES:  is allergic to bactrim [sulfamethoxazole-trimethoprim]; phenergan [promethazine hcl]; sulfa antibiotics; and penicillins.  MEDICATIONS:  Current Outpatient Prescriptions  Medication Sig Dispense Refill  . capecitabine (XELODA) 500 MG tablet Take 3 tablets (1,500 mg total) by mouth 2 (two) times daily after a meal. Take on days of radiation only (Patient not taking: Reported on 07/28/2016) 168 tablet 0  . cholecalciferol (VITAMIN D) 1000 UNITS tablet Take 1,000 Units by mouth daily.      Marland Kitchen dicyclomine (BENTYL) 10 MG capsule Take 1 tab twice daily as needed for cramps. (Patient not taking: Reported on 07/28/2016) 30 capsule 1  . ferrous sulfate (FERROUSUL) 325 (65 FE) MG tablet Take 1 tablet (325 mg total) by mouth daily with breakfast. 30 tablet 6  . Ibuprofen-Diphenhydramine Cit (ADVIL PM)  200-38 MG TABS Take 1 tablet by mouth daily after supper.    . lidocaine (XYLOCAINE) 2 % jelly Apply 1 application topically 3 (three) times daily. As needed (Patient not taking: Reported on 07/28/2016) 30 mL 1  . Multiple Vitamin (MULTIVITAMIN) tablet Take 1 tablet by mouth daily.    . Vitamin D, Ergocalciferol, (DRISDOL) 50000 units CAPS capsule Take 1 capsule (50,000 Units total) by mouth every 7 (seven) days. 12 capsule 0   Current Facility-Administered Medications  Medication Dose Route Frequency Provider Last Rate Last Dose  . 0.9 %  sodium chloride infusion  500 mL Intravenous Continuous Irene Shipper, MD        REVIEW OF SYSTEMS:  Constitutional:  Denies fevers, chills or abnormal night sweats, (+) ongoing fatigue Eyes: Denies blurriness of vision, double vision or watery eyes Ears, nose, mouth, throat, and face: Denies mucositis or sore throat Respiratory: Denies cough, dyspnea or wheezes Cardiovascular: Denies palpitation, chest discomfort or lower extremity swelling Gastrointestinal:  Denies nausea, heartburn, (+) incontinence, hematochezia, irregular bowel activity Skin: Denies abnormal skin rashes Lymphatics: Denies new lymphadenopathy or easy bruising Neurological:Denies numbness, tingling or new weaknesses Behavioral/Psych: Mood is stable, no new changes  All other systems were reviewed with the patient and are negative.  PHYSICAL EXAMINATION: ECOG PERFORMANCE STATUS: 1 - Symptomatic but completely ambulatory 08/04/16: BP: 130/60 P: 57 R: 19 W 147lbs.  GENERAL:alert, no distress and comfortable SKIN: skin color, texture, turgor are normal, no rashes or significant lesions EYES: normal, conjunctiva are pink and non-injected, sclera clear OROPHARYNX:no exudate, no erythema and lips, buccal mucosa, and tongue normal NECK: supple, thyroid normal size, non-tender, without nodularity LYMPH:  no palpable lymphadenopathy in the cervical, axillary or inguinal LUNGS: clear to auscultation and percussion with normal breathing effort HEART: regular rate & rhythm and no murmurs and no lower extremity edema ABDOMEN:abdomen soft, non-tender, and normal bowel sounds. No organomegaly. Musculoskeletal:no cyanosis of digits and no clubbing PSYCH: alert & oriented x 3 with fluent speech NEURO: no focal motor/sensory deficits Rectal Exam: There is a palpable mass in the posterior right, which is firm, and approximately 3 cm from the anal ridge. Blood on tip of glove.   LABORATORY DATA:  I have reviewed the data as listed. CBC Latest Ref Rng & Units 08/04/2016 07/20/2016 06/03/2016  WBC 3.9 - 10.3 10e3/uL 7.7 9.1 7.2  Hemoglobin 11.6 - 15.9 g/dL  12.0 11.7 11.1(L)  Hematocrit 34.8 - 46.6 % 37.7 36.0 33.8(L)  Platelets 145 - 400 10e3/uL 246 308 264.0   CMP Latest Ref Rng & Units 08/04/2016 07/09/2016 12/22/2015  Glucose 70 - 140 mg/dl 83 79 135(H)  BUN 7.0 - 26.0 mg/dL 20.9 12 12   Creatinine 0.6 - 1.1 mg/dL 0.6 0.52 0.53  Sodium 136 - 145 mEq/L 143 143 141  Potassium 3.5 - 5.1 mEq/L 3.6 3.6 4.0  Chloride 96 - 112 mEq/L - 107 108  CO2 22 - 29 mEq/L 22 25 26   Calcium 8.4 - 10.4 mg/dL 9.3 8.7 8.9  Total Protein 6.4 - 8.3 g/dL 6.0(L) 6.1 6.1  Total Bilirubin 0.20 - 1.20 mg/dL 0.28 0.4 0.3  Alkaline Phos 40 - 150 U/L 82 84 93  AST 5 - 34 U/L 21 30 24   ALT 0 - 55 U/L 28 32 28    PATHOLOGY: Diagnosis Surgical [P], rectum mass - INVASIVE ADENOCARCINOMA. - SEE COMMENT. Microscopic Comment Dr. Vicente Males has reviewed the case and concurs with this interpretation. Dr. Henrene Pastor was paged on 07/13/2016. (JBK:[id number]  07/13/16)  RADIOGRAPHIC STUDIES: I have personally reviewed the radiological images as listed and agreed with the findings in the report. Ct Chest W Contrast  Result Date: 07/12/2016 CLINICAL DATA:  Rectal mass, newly diagnosed rectal cancer, evaluate for metastatic disease EXAM: CT CHEST, ABDOMEN, AND PELVIS WITH CONTRAST TECHNIQUE: Multidetector CT imaging of the chest, abdomen and pelvis was performed following the standard protocol during bolus administration of intravenous contrast. CONTRAST:  177mL ISOVUE-300 IOPAMIDOL (ISOVUE-300) INJECTION 61% COMPARISON:  None. FINDINGS: CT CHEST FINDINGS Cardiovascular: The heart is normal in size. No pericardial effusion. Mild coronary atherosclerosis in the LAD and right coronary artery. No evidence of thoracic aortic aneurysm. Mild atherosclerotic calcifications of the aortic arch. Mediastinum/Nodes: No suspicious mediastinal lymphadenopathy. Visualized thyroid is unremarkable. Lungs/Pleura: Lungs are clear. No suspicious pulmonary nodules. No focal consolidation. No pleural effusion or  pneumothorax. Musculoskeletal: Degenerative changes of the thoracic spine. CT ABDOMEN PELVIS FINDINGS Motion degraded images. Hepatobiliary: 2.2 cm probable hemangioma in segment 4B (series 2/image 55), although poorly evaluated. Additional 5 mm probable cyst in segment 3 (series 2/ image 54). Status post cholecystectomy. No intrahepatic or extrahepatic ductal dilatation. Pancreas: Within normal limits. Spleen: Within normal limits. Adrenals/Urinary Tract: 1.6 cm right adrenal nodule (series 2/image 51). 11 mm left adrenal nodule (series 2/ image 29). These are technically indeterminate but likely reflect small benign adrenal adenomas. 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 Two nonobstructing right renal calculi measuring 2-3 mm. Left kidney is within normal limits. No hydronephrosis. Bladder is within normal limits. Stomach/Bowel: Stomach is notable for postsurgical changes related to gastric bypass. No evidence of bowel obstruction. Normal appendix (series 2/ image 86). Rectal wall thickening/ mass (sagittal image 78), compatible with known rectal adenocarcinoma. Vascular/Lymphatic: No evidence of abdominal aortic aneurysm. No suspicious abdominopelvic lymphadenopathy. Specifically, no suspicious perirectal lymphadenopathy. Reproductive: Uterus is within normal limits. Bilateral ovaries are within normal limits. Other: No abdominopelvic ascites. Musculoskeletal: Mild degenerative changes of the lumbar spine. IMPRESSION: Rectal wall thickening/mass, compatible with known rectal adenocarcinoma. No findings specific for metastatic disease in the chest, abdomen, or pelvis. 2.2 cm probable hemangioma in segment 4B, although poorly evaluated. Consider MRI abdomen with/ without multihance contrast for definitive characterization, as clinically warranted. Bilateral adrenal nodules measuring up to 1.6 cm on the right, likely reflecting benign adrenal adenomas, although  technically indeterminate. If MRI abdomen is performed, these can be definitively characterized at that time. Electronically Signed   By: Julian Hy M.D.   On: 07/12/2016 17:04   Mr Pelvis W Wo Contrast  Result Date: 08/03/2016 CLINICAL DATA:  Rectal cancer.  Staging. EXAM: MRI ABDOMEN AND PELVIS WITHOUT AND WITH CONTRAST TECHNIQUE: Multiplanar multisequence MR imaging of the abdomen and pelvis was performed both before and after the administration of intravenous contrast. CONTRAST:  36mL MULTIHANCE GADOBENATE DIMEGLUMINE 529 MG/ML IV SOLN COMPARISON:  07/12/2016. FINDINGS: COMBINED FINDINGS FOR BOTH MR ABDOMEN AND PELVIS Lower chest: No acute findings. Hepatobiliary: Within the anterior dome of liver there is a small arterial phase enhancing structure measuring 8 mm, image 11 of series 11901. This becomes isointense to liver on the portal venous and delayed phase images. There is a focal area of signal dropout within segment 4 B which corresponds to the hypodense lesion on previous exam from 07/12/2016 compatible with focal fatty deposition. Previous cholecystectomy. Mild increase caliber of the CBD which measures 7 mm. No obstructing stone or mass noted. Pancreas: No mass, inflammatory changes, or other parenchymal abnormality  identified. Spleen:  Within normal limits in size and appearance. Adrenals/Urinary Tract: Small bilateral adrenal nodules show loss of signal on the out of phase sequences compatible with benign adenomas. No kidney mass identified. Urinary bladder is unremarkable. Stomach/Bowel: The stomach is normal. No pathologic dilatation of the large or small bowel loops. Rectal mass is again noted. This measures 5.3 x 4.1 x 6.1 cm. This extends to the level of the an anorectal angle compatible with a low rectal cancer. The lesion extends beyond the muscularis propria by at least 2.4 cm. No surrounding lymph nodes identified. Vascular/Lymphatic: No pathologically enlarged lymph nodes  identified. No abdominal aortic aneurysm demonstrated. Reproductive: Uterus and bilateral adnexa are unremarkable. Other:  None. Musculoskeletal: No suspicious bone lesions identified. IMPRESSION: 1. Large rectal mass measures 6.1 cm in length. No obstruction identified. This is compatible with at least a T3bN0M0 lesion. 2. No specific features highly suspicious for nodal metastasis or metastatic disease to the upper abdomen. 3. Indeterminate arterial phase enhancing lesion within the anterior dome of liver measures less than 1 cm. This may represent a benign liver lesions such as FNH or adenoma. Less favored with the hypervascular liver metastasis. Followup imaging at 6 months with repeat MRI of the liver is advise. 4. Bilateral adrenal adenomas. Electronically Signed   By: Kerby Moors M.D.   On: 08/03/2016 10:05   Mr Abdomen W Wo Contrast  Result Date: 08/03/2016 CLINICAL DATA:  Rectal cancer.  Staging. EXAM: MRI ABDOMEN AND PELVIS WITHOUT AND WITH CONTRAST TECHNIQUE: Multiplanar multisequence MR imaging of the abdomen and pelvis was performed both before and after the administration of intravenous contrast. CONTRAST:  43mL MULTIHANCE GADOBENATE DIMEGLUMINE 529 MG/ML IV SOLN COMPARISON:  07/12/2016. FINDINGS: COMBINED FINDINGS FOR BOTH MR ABDOMEN AND PELVIS Lower chest: No acute findings. Hepatobiliary: Within the anterior dome of liver there is a small arterial phase enhancing structure measuring 8 mm, image 11 of series 11901. This becomes isointense to liver on the portal venous and delayed phase images. There is a focal area of signal dropout within segment 4 B which corresponds to the hypodense lesion on previous exam from 07/12/2016 compatible with focal fatty deposition. Previous cholecystectomy. Mild increase caliber of the CBD which measures 7 mm. No obstructing stone or mass noted. Pancreas: No mass, inflammatory changes, or other parenchymal abnormality identified. Spleen:  Within normal limits  in size and appearance. Adrenals/Urinary Tract: Small bilateral adrenal nodules show loss of signal on the out of phase sequences compatible with benign adenomas. No kidney mass identified. Urinary bladder is unremarkable. Stomach/Bowel: The stomach is normal. No pathologic dilatation of the large or small bowel loops. Rectal mass is again noted. This measures 5.3 x 4.1 x 6.1 cm. This extends to the level of the an anorectal angle compatible with a low rectal cancer. The lesion extends beyond the muscularis propria by at least 2.4 cm. No surrounding lymph nodes identified. Vascular/Lymphatic: No pathologically enlarged lymph nodes identified. No abdominal aortic aneurysm demonstrated. Reproductive: Uterus and bilateral adnexa are unremarkable. Other:  None. Musculoskeletal: No suspicious bone lesions identified. IMPRESSION: 1. Large rectal mass measures 6.1 cm in length. No obstruction identified. This is compatible with at least a T3bN0M0 lesion. 2. No specific features highly suspicious for nodal metastasis or metastatic disease to the upper abdomen. 3. Indeterminate arterial phase enhancing lesion within the anterior dome of liver measures less than 1 cm. This may represent a benign liver lesions such as FNH or adenoma. Less favored with the hypervascular liver  metastasis. Followup imaging at 6 months with repeat MRI of the liver is advise. 4. Bilateral adrenal adenomas. Electronically Signed   By: Kerby Moors M.D.   On: 08/03/2016 10:05   Ct Abdomen Pelvis W Contrast  Result Date: 07/12/2016 CLINICAL DATA:  Rectal mass, newly diagnosed rectal cancer, evaluate for metastatic disease EXAM: CT CHEST, ABDOMEN, AND PELVIS WITH CONTRAST TECHNIQUE: Multidetector CT imaging of the chest, abdomen and pelvis was performed following the standard protocol during bolus administration of intravenous contrast. CONTRAST:  12mL ISOVUE-300 IOPAMIDOL (ISOVUE-300) INJECTION 61% COMPARISON:  None. FINDINGS: CT CHEST FINDINGS  Cardiovascular: The heart is normal in size. No pericardial effusion. Mild coronary atherosclerosis in the LAD and right coronary artery. No evidence of thoracic aortic aneurysm. Mild atherosclerotic calcifications of the aortic arch. Mediastinum/Nodes: No suspicious mediastinal lymphadenopathy. Visualized thyroid is unremarkable. Lungs/Pleura: Lungs are clear. No suspicious pulmonary nodules. No focal consolidation. No pleural effusion or pneumothorax. Musculoskeletal: Degenerative changes of the thoracic spine. CT ABDOMEN PELVIS FINDINGS Motion degraded images. Hepatobiliary: 2.2 cm probable hemangioma in segment 4B (series 2/image 55), although poorly evaluated. Additional 5 mm probable cyst in segment 3 (series 2/ image 54). Status post cholecystectomy. No intrahepatic or extrahepatic ductal dilatation. Pancreas: Within normal limits. Spleen: Within normal limits. Adrenals/Urinary Tract: 1.6 cm right adrenal nodule (series 2/image 51). 11 mm left adrenal nodule (series 2/ image 61). These are technically indeterminate but likely reflect small benign adrenal adenomas. 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 Two nonobstructing right renal calculi measuring 2-3 mm. Left kidney is within normal limits. No hydronephrosis. Bladder is within normal limits. Stomach/Bowel: Stomach is notable for postsurgical changes related to gastric bypass. No evidence of bowel obstruction. Normal appendix (series 2/ image 86). Rectal wall thickening/ mass (sagittal image 78), compatible with known rectal adenocarcinoma. Vascular/Lymphatic: No evidence of abdominal aortic aneurysm. No suspicious abdominopelvic lymphadenopathy. Specifically, no suspicious perirectal lymphadenopathy. Reproductive: Uterus is within normal limits. Bilateral ovaries are within normal limits. Other: No abdominopelvic ascites. Musculoskeletal: Mild degenerative changes of the lumbar spine. IMPRESSION:  Rectal wall thickening/mass, compatible with known rectal adenocarcinoma. No findings specific for metastatic disease in the chest, abdomen, or pelvis. 2.2 cm probable hemangioma in segment 4B, although poorly evaluated. Consider MRI abdomen with/ without multihance contrast for definitive characterization, as clinically warranted. Bilateral adrenal nodules measuring up to 1.6 cm on the right, likely reflecting benign adrenal adenomas, although technically indeterminate. If MRI abdomen is performed, these can be definitively characterized at that time. Electronically Signed   By: Julian Hy M.D.   On: 07/12/2016 17:04    ASSESSMENT & PLAN:  Tonya [name] is a 66 y.o. woman with past medical history of gastric bypass surgery, presented with rectal bleeding, loose bowel movement and stool incontinence for one half years, was recently diagnosed invasive rectal adenocarcinoma.  1. Invasive low rectal adenocarcinoma, cT3N0M0 -I reviewed his CT scan findings, and surgical pathology results in great details with patient and her friend.  -I discussed her abdominal and pelvic MRI scan findings, which showed a T3 N0 rectal tumor. The liver lesion is indeterminate, does not have possibility of metastasis. We'll continue observation, and repeat scan in 3-6 months. -Given her T3 disease, I recommend neoadjuvant chemotherapy and irradiation, followed by surgery and adjuvant chemotherapy. -We discussed the role of neoadjuvant chemotherapy for colorectal cancer, to shrink the tumor and reduce the risk of cancer recurrence after surgery -She has seen colorectal surgeon Dr. Marcello Moores, surgical  option of APR vs low anterior resection were discussed, Dr. Marcello Moores feels she needs permanent colostomy. Due to her prior gastric bypass surgery, however patient is very reluctant to have surgery due to the need of colostomy bag. -Patient agrees to proceed with concurrent chemotherapy and radiation. She has received  Xeloda, knows how to take it. Potential side effects reviewed with her again. -All monitor her CBC and CMP weekly, and I'll see her every other week when she is on concurrent chemotherapy and radiation.  2. Iron Deficient Anemia - The patient takes 2 iron supplements daily. - Her iron has been somewhat low. - She will stop taking aspirin to reduce her bleeding risk. -Repeat iron studies showed iron deficiency, she will receive 1 dose IV iron Feraheme today.  3. Genetics -Patient has 2 sisters who had uterine cancer, this is suspicious for lynch syndrome. - I encouraged the patient to pursue genetic testing based on her family history. This will determine if the patient carries deleterious genes or is at risk for lynch syndrome. - The patient is in agreement and I will refer her to these services.  4. History of gastric bypass surgery in 2003 -for obesity   5. History of DM and HTN -Resolved after her gastric bypass surgery. -We'll monitor her blood pressure and glucose closely during her chemotherapy treatment.  6. Liver lesion -Indeterminate, no high suspicion for metastatic disease, plan to repeat liver MRI in 3-6 months.  PLAN: -Call in Zofran/comapzine today -She'll start Xeloda and radiation next Monday -F/u in 2 weeks  -lab weekly  -I filled her paperwork today for her reimbursement of her cancelled flight.    Orders Placed This Encounter  Procedures  . Comprehensive metabolic panel    Standing Status:   Standing    Number of Occurrences:   50    Standing Expiration Date:   08/03/2017   All questions were answered. The patient knows to call the clinic with any problems, questions or concerns.  I spent 25 minutes counseling the patient face to face. The total time spent in the appointment was 30 minutes and more than 50% was on counseling.  This document serves as a record of services personally performed by Truitt Merle, MD. It was created on her behalf by Joslyn Devon,  a trained medical scribe. The creation of this record is based on the scribe's personal observations and the provider's statements to them. This document has been checked and approved by the attending provider.     Truitt Merle, MD 08/04/2016

## 2016-08-05 ENCOUNTER — Telehealth: Payer: Self-pay | Admitting: Hematology

## 2016-08-05 NOTE — Telephone Encounter (Signed)
lvm to inform pt of added lab/MD appt 8/13 at 1345 per sch msg

## 2016-08-06 DIAGNOSIS — Z510 Encounter for antineoplastic radiation therapy: Secondary | ICD-10-CM | POA: Diagnosis not present

## 2016-08-06 DIAGNOSIS — Z833 Family history of diabetes mellitus: Secondary | ICD-10-CM | POA: Diagnosis not present

## 2016-08-06 DIAGNOSIS — D500 Iron deficiency anemia secondary to blood loss (chronic): Secondary | ICD-10-CM | POA: Diagnosis not present

## 2016-08-06 DIAGNOSIS — C20 Malignant neoplasm of rectum: Secondary | ICD-10-CM | POA: Diagnosis not present

## 2016-08-06 DIAGNOSIS — Z808 Family history of malignant neoplasm of other organs or systems: Secondary | ICD-10-CM | POA: Diagnosis not present

## 2016-08-09 ENCOUNTER — Encounter: Payer: Self-pay | Admitting: Family Medicine

## 2016-08-09 ENCOUNTER — Ambulatory Visit (INDEPENDENT_AMBULATORY_CARE_PROVIDER_SITE_OTHER): Payer: Medicare HMO | Admitting: Family Medicine

## 2016-08-09 VITALS — BP 118/64 | HR 72 | Temp 98.4°F | Wt 145.8 lb

## 2016-08-09 DIAGNOSIS — C20 Malignant neoplasm of rectum: Secondary | ICD-10-CM

## 2016-08-09 NOTE — Progress Notes (Signed)
Subjective:   Patient ID: Tonya [name], female    DOB: [birth year], 66 y.o.   MRN: [id number]  Tonya [name] is a pleasant 66 y.o. year old female who presents to clinic today with Referral (wants second opinion)  on 08/09/2016  HPI:  Recent diagnosis of rectal CA.  Has been followed by Dr. Burr Medico.  Was last seen on 08/04/16. Note reviewed.  Plan as follows:  1. Invasive low rectal adenocarcinoma, cT3N0M0 -I reviewed his CT scan findings, and surgical pathology results in great details with patient and her friend.  -I discussed her abdominal and pelvic MRI scan findings, which showed a T3 N0 rectal tumor. The liver lesion is indeterminate, does not have possibility of metastasis. We'll continue observation, and repeat scan in 3-6 months. -Given her T3 disease, I recommend neoadjuvant chemotherapy and irradiation, followed by surgery and adjuvant chemotherapy. -We discussed the role of neoadjuvant chemotherapy for colorectal cancer, to shrink the tumor and reduce the risk of cancer recurrence after surgery -She has seen colorectal surgeon Dr. Marcello Moores, surgical option of APR vs low anterior resection were discussed, Dr. Marcello Moores feels she needs permanent colostomy. Due to her prior gastric bypass surgery, however patient is very reluctant to have surgery due to the need of colostomy bag. -Patient agrees to proceed with concurrent chemotherapy and radiation. She has received Xeloda, knows how to take it. Potential side effects reviewed with her again. -All monitor her CBC and CMP weekly, and I'll see her every other week when she is on concurrent chemotherapy and radiation.  Tonya [name] does not want to have a permanent colostomy if she can avoid this and is asking for a second opinion.  Current Outpatient Prescriptions on File Prior to Visit  Medication Sig Dispense Refill  . capecitabine (XELODA) 500 MG tablet Take 3 tablets (1,500 mg total) by mouth 2 (two) times daily after a meal. Take  on days of radiation only 168 tablet 0  . cholecalciferol (VITAMIN D) 1000 UNITS tablet Take 1,000 Units by mouth daily.      Marland Kitchen dicyclomine (BENTYL) 10 MG capsule Take 1 tab twice daily as needed for cramps. 30 capsule 1  . ferrous sulfate (FERROUSUL) 325 (65 FE) MG tablet Take 1 tablet (325 mg total) by mouth daily with breakfast. (Patient taking differently: Take 325 mg by mouth. Take two by mouth daily) 30 tablet 6  . Ibuprofen-Diphenhydramine Cit (ADVIL PM) 200-38 MG TABS Take 1 tablet by mouth daily after supper.    . Multiple Vitamin (MULTIVITAMIN) tablet Take 1 tablet by mouth daily.    . Vitamin D, Ergocalciferol, (DRISDOL) 50000 units CAPS capsule Take 1 capsule (50,000 Units total) by mouth every 7 (seven) days. 12 capsule 0  . lidocaine (XYLOCAINE) 2 % jelly Apply 1 application topically 3 (three) times daily. As needed (Patient not taking: Reported on 07/28/2016) 30 mL 1   Current Facility-Administered Medications on File Prior to Visit  Medication Dose Route Frequency Provider Last Rate Last Dose  . 0.9 %  sodium chloride infusion  500 mL Intravenous Continuous Irene Shipper, MD        Allergies  Allergen Reactions  . Bactrim [Sulfamethoxazole-Trimethoprim] Anaphylaxis, Hives and Other (See Comments)  . Phenergan [Promethazine Hcl]     Hives  . Sulfa Antibiotics   . Penicillins     REACTION: As a child.    Past Medical History:  Diagnosis Date  . Coronary artery disease 07/2003  . Myocardial infarction ([place])   .  Obesity 2003   s/p gastric bypass     Past Surgical History:  Procedure Laterality Date  . CHOLECYSTECTOMY  1999  . GASTRIC BYPASS  2003    Family History  Problem Relation Age of Onset  . Heart attack Mother        d.93  . Throat cancer Mother 3  . Clotting disorder Mother   . Liver disease Mother   . Kidney disease Mother   . Heart attack Father        d.92  . Prostate cancer Father 70  . Uterine cancer Sister 15       treated with total  hysterectomy and radiation  . Thyroid cancer Sister 50       treated with thyroidectomy  . Cancer Maternal Uncle        d.80s unspecified type of cancer. History of smoking.  Marland Kitchen Uterine cancer Sister 71  . Colon cancer Cousin 43       paternal first-cousin    Social History   Social History  . Marital status: Single    Spouse name: N/A  . Number of children: 0  . Years of education: N/A   Occupational History  . retired    Social History Main Topics  . Smoking status: Never Smoker  . Smokeless tobacco: Never Used  . Alcohol use No  . Drug use: No  . Sexual activity: Not on file   Other Topics Concern  . Not on file   Social History Narrative  . No narrative on file   The PMH, PSH, Social History, Family History, Medications, and allergies have been reviewed in [facility], and have been updated if relevant.  Review of Systems  All other systems reviewed and are negative.      Objective:    BP 118/64   Pulse 72   Temp 98.4 F (36.9 C) (Oral)   Wt 145 lb 12 oz (66.1 kg)   SpO2 99%   BMI 28.46 kg/m    Physical Exam   General:  Well-developed,well-nourished,in no acute distress; alert,appropriate and cooperative throughout examination Head:  normocephalic and atraumatic.   Eyes:  vision grossly intact Ears:  R ear normal and L ear normal externally Nose:  no external deformity.   Mouth:  good dentition.   Lungs:  Normal respiratory effort, chest expands symmetrically.  Msk:  No deformity or scoliosis noted of thoracic or lumbar spine.   Extremities:  No clubbing, cyanosis, edema, or deformity noted with normal full range of motion of all joints.   Neurologic:  alert & oriented X3 and gait normal.   Skin:  Intact without suspicious lesions or rashes Psych:  Cognition and judgment appear intact. Alert and cooperative with normal attention span and concentration. No apparent delusions, illusions, hallucinations       Assessment & Plan:   Rectal adenocarcinoma  ([place]) - Plan: Ambulatory referral to General Surgery No Follow-up on file.

## 2016-08-09 NOTE — Patient Instructions (Signed)
It was so good to see you.  Please stop by to see Rosaria Ferries on your way out.

## 2016-08-09 NOTE — Assessment & Plan Note (Signed)
>  25 minutes spent in face to face time with patient, >50% spent in counselling or coordination of care Agree that a second opinion is always a good idea. Refer to Dr. Demetrio Lapping at [facility].

## 2016-08-10 ENCOUNTER — Ambulatory Visit
Admission: RE | Admit: 2016-08-10 | Discharge: 2016-08-10 | Disposition: A | Discharge status: Home / Self Care | Payer: Medicare HMO | Source: Ambulatory Visit | Attending: Radiation Oncology | Admitting: Radiation Oncology

## 2016-08-10 DIAGNOSIS — C20 Malignant neoplasm of rectum: Secondary | ICD-10-CM | POA: Diagnosis not present

## 2016-08-10 DIAGNOSIS — Z510 Encounter for antineoplastic radiation therapy: Secondary | ICD-10-CM | POA: Diagnosis not present

## 2016-08-10 DIAGNOSIS — Z833 Family history of diabetes mellitus: Secondary | ICD-10-CM | POA: Diagnosis not present

## 2016-08-10 DIAGNOSIS — D500 Iron deficiency anemia secondary to blood loss (chronic): Secondary | ICD-10-CM | POA: Diagnosis not present

## 2016-08-10 DIAGNOSIS — Z808 Family history of malignant neoplasm of other organs or systems: Secondary | ICD-10-CM | POA: Diagnosis not present

## 2016-08-11 ENCOUNTER — Other Ambulatory Visit: Payer: Self-pay | Admitting: *Deleted

## 2016-08-11 ENCOUNTER — Ambulatory Visit
Admission: RE | Admit: 2016-08-11 | Discharge: 2016-08-11 | Disposition: A | Discharge status: Home / Self Care | Payer: Medicare HMO | Source: Ambulatory Visit | Attending: Radiation Oncology | Admitting: Radiation Oncology

## 2016-08-11 ENCOUNTER — Telehealth: Payer: Self-pay

## 2016-08-11 DIAGNOSIS — C20 Malignant neoplasm of rectum: Secondary | ICD-10-CM | POA: Diagnosis not present

## 2016-08-11 DIAGNOSIS — Z510 Encounter for antineoplastic radiation therapy: Secondary | ICD-10-CM | POA: Diagnosis not present

## 2016-08-11 DIAGNOSIS — Z833 Family history of diabetes mellitus: Secondary | ICD-10-CM | POA: Diagnosis not present

## 2016-08-11 DIAGNOSIS — Z808 Family history of malignant neoplasm of other organs or systems: Secondary | ICD-10-CM | POA: Diagnosis not present

## 2016-08-11 DIAGNOSIS — D500 Iron deficiency anemia secondary to blood loss (chronic): Secondary | ICD-10-CM | POA: Diagnosis not present

## 2016-08-11 MED ORDER — ONDANSETRON HCL 8 MG PO TABS
8.0000 mg | ORAL_TABLET | Freq: Three times a day (TID) | ORAL | 1 refills | Status: DC | PRN
Start: 2016-08-11 — End: 2017-08-29

## 2016-08-11 MED ORDER — PROCHLORPERAZINE MALEATE 10 MG PO TABS: 10.0000 mg | ORAL_TABLET | Freq: Four times a day (QID) | ORAL | 1 refills | Status: DC | PRN

## 2016-08-11 MED FILL — ONDANSETRON HCL 8 MG TAB: 8 | 6 days supply | Qty: 20 | Fill #0

## 2016-08-11 MED FILL — PROCHLORPERAZINE 10 MG TAB: 10 | 7 days supply | Qty: 30 | Fill #0

## 2016-08-11 NOTE — Telephone Encounter (Signed)
Nutrition Assessment   Reason for Assessment:   Patient identified on Malnutrition Screening Report for weight loss  ASSESSMENT:  66 year old female with rectal adenocarcinoma.  Past medical history of CAD, MI, gastric bypass in 2003. Patient just started oral xeloda and radiation treatments  Spoke with patient via phone this am.  Patient reports appetite has been good, feels that the cancer has caused her weight loss.  Reports that all her life she has been trying to loose weight and now she is trying to maintain her weight or stop loosing weight.  Reports that she this am ate english muffin with egg, cheese and sausage on it, yesterday am ate oatmeal and yogurt.  Today planning to go out to eat with friends for lunch and dinner.  Reports yesterday ate sandwich for lunch.  Usually has an afternoon snack of cheese and crackers.  Does not like the protein shakes but has not tried them since 2003.  Reports that sugary foods (ie deserts do not sit well with her stomach).  Reports no nausea, diarrhea recently  Nutrition Focused Physical Exam: deferred  Medications: Vit D, Fe sulfate, MVI,  Labs: reviewed  Anthropometrics:   Height: 60 inches Weight: 145 lb 12 oz Noted  30 lb weight loss since January 2018 BMI: 28  17% weight loss in the last 7 months   Estimated Energy Needs  Kcals: 1650-1980 calories/d Protein: 68-90 g/d Fluid: 1.9 L/d  NUTRITION DIAGNOSIS: Increased nutrient needs related to cancer as evidenced by weight loss of 17% in the last 7 months   INTERVENTION:   Discussed ways to add calories and protein.  Encouraged small frequent meals. Discussed oral nutrition supplement options. Recipes for shakes mailed to patient with coupons. Patient would benefit from bariatric specific MVI with history of gastric bypass and additional calcium 500mg  TID.     MONITORING, EVALUATION, GOAL: Patient will consume adequate calories and protein to meet nutritional needs   NEXT  VISIT: as needed, patient to contact  Kieffer Blatz B. Zenia Resides, [place], [place] Registered Dietitian [phone] (pager)

## 2016-08-12 ENCOUNTER — Ambulatory Visit
Admission: RE | Admit: 2016-08-12 | Discharge: 2016-08-12 | Disposition: A | Discharge status: Home / Self Care | Payer: Medicare HMO | Source: Ambulatory Visit | Attending: Radiation Oncology | Admitting: Radiation Oncology

## 2016-08-12 DIAGNOSIS — D500 Iron deficiency anemia secondary to blood loss (chronic): Secondary | ICD-10-CM | POA: Diagnosis not present

## 2016-08-12 DIAGNOSIS — Z808 Family history of malignant neoplasm of other organs or systems: Secondary | ICD-10-CM | POA: Diagnosis not present

## 2016-08-12 DIAGNOSIS — Z833 Family history of diabetes mellitus: Secondary | ICD-10-CM | POA: Diagnosis not present

## 2016-08-12 DIAGNOSIS — Z510 Encounter for antineoplastic radiation therapy: Secondary | ICD-10-CM | POA: Diagnosis not present

## 2016-08-12 DIAGNOSIS — C20 Malignant neoplasm of rectum: Secondary | ICD-10-CM | POA: Diagnosis not present

## 2016-08-13 ENCOUNTER — Ambulatory Visit
Admission: RE | Admit: 2016-08-13 | Discharge: 2016-08-13 | Disposition: A | Discharge status: Home / Self Care | Payer: Medicare HMO | Source: Ambulatory Visit | Attending: Radiation Oncology | Admitting: Radiation Oncology

## 2016-08-13 DIAGNOSIS — D500 Iron deficiency anemia secondary to blood loss (chronic): Secondary | ICD-10-CM | POA: Diagnosis not present

## 2016-08-13 DIAGNOSIS — Z808 Family history of malignant neoplasm of other organs or systems: Secondary | ICD-10-CM | POA: Diagnosis not present

## 2016-08-13 DIAGNOSIS — C20 Malignant neoplasm of rectum: Secondary | ICD-10-CM | POA: Diagnosis not present

## 2016-08-13 DIAGNOSIS — Z510 Encounter for antineoplastic radiation therapy: Secondary | ICD-10-CM | POA: Diagnosis not present

## 2016-08-13 DIAGNOSIS — Z833 Family history of diabetes mellitus: Secondary | ICD-10-CM | POA: Diagnosis not present

## 2016-08-13 NOTE — Progress Notes (Signed)
[place]  [phone] Fax:([phone]  Clinic Follow Up Note   Patient Care Team: Lucille Passy, MD as PCP - General (Family Medicine) Irene Shipper, MD as Consulting Physician (Gastroenterology) Leighton Ruff, MD as Consulting Physician (General Surgery) Truitt Merle, MD as Consulting Physician (Hematology) Kyung Rudd, MD as Consulting Physician (Radiation Oncology) 08/16/2016  CHIEF COMPLAINTS:  Follow up rectal Adenocarcinoma  Oncology History   Cancer Staging Rectal adenocarcinoma [facility]) Staging form: Colon and Rectum, AJCC 8th Edition - Clinical stage from 07/09/2016: Stage IIA (cT3, cN0, cM0) - Signed by Truitt Merle, MD on 08/16/2016       Rectal adenocarcinoma ([place])   07/09/2016 Pathology Results    Diagnosis Surgical [P], rectum mass - INVASIVE ADENOCARCINOMA. - SEE COMMENT.      07/09/2016 Procedure    Colonoscopy A non-obstructing large friable mass was found in the distal rectum. This began at approximately 1 cm above the anal verge and extended proximal for a distance of 8-10 cm. The lesion was bulky and involved approximately 75% of the luminal circumference. Multiple biopsies were taken. A few small-mouthed diverticula were found in the left colon. The exam was otherwise without abnormality on direct views. Retroflexion not performed purposely.      07/12/2016 Imaging    CT C/A/P with contrast IMPRESSION: Rectal wall thickening/mass, compatible with known rectal adenocarcinoma. No findings specific for metastatic disease in the chest, abdomen, or pelvis. 2.2 cm probable hemangioma in segment 4B, although poorly evaluated. Consider MRI abdomen with/ without multihance contrast for definitive characterization, as clinically warranted. Bilateral adrenal nodules measuring up to 1.6 cm on the right, likely reflecting benign adrenal adenomas, although technically indeterminate. If MRI abdomen is performed, these can be definitively  characterized at that time.      07/20/2016 Initial Diagnosis    Rectal adenocarcinoma ([place])     08/03/2016 Imaging    MRI AP W WO Contrast 08/03/16 IMPRESSION: 1. Large rectal mass measures 6.1 cm in length. No obstruction identified. This is compatible with at least a T3bN0M0 lesion. 2. No specific features highly suspicious for nodal metastasis or metastatic disease to the upper abdomen. 3. Indeterminate arterial phase enhancing lesion within the anterior dome of liver measures less than 1 cm. This may represent a benign liver lesions such as FNH or adenoma. Less favored with the hypervascular liver metastasis. Followup imaging at 6 months with repeat MRI of the liver is advise. 4. Bilateral adrenal adenomas      08/04/2016 -  Radiation Therapy    Concurrent chemo radiation with Dr. [name]       08/04/2016 -  Chemotherapy    Concurrent chemo radiation with Xeloda, 1500mg  twice daily       HISTORY OF PRESENTING ILLNESS: 07/20/16 [name] 66 y.o. female is here because of recently discovered invasive adenocarcinoma. She is accompanied by a friend today. The patient underwent colonoscopy on 07/09/16 with Dr. [name]. According to Dr. Blanch Media note from that encounter, the patient presented with complaints of rectal bleeding and iron deficiency anemia. She reports she was told she has hemorrhoids and initially was not concerned about the rectal bleeding. Her most recent prior colonoscopy was 2005. On exam, Dr. Henrene Pastor noted a non-obstructing large friable mass was found in the distal rectum. This began at approximately 1 cm above the anal verge and extended proximal for a distance of 8-10 cm. The lesion was bulky and involved approximately 75% of the luminal circumference. Biopsy of the  rectal mass on 07/09/16 showed invasive adenocarcinoma.  CT C/A/P on 07/12/16 revealed rectal wall thickening/mass, compatible with known rectal adenocarcinoma.No findings specific for metastatic disease  in the chest, abdomen, or pelvis. 2.2 cm probable hemangioma in segment 4B, although poorly evaluated. Consider MRI abdomen with/ without multihance contrast for definitive characterization, as clinically warranted. Bilateral adrenal nodules measuring up to 1.6 cm on the right, likely reflecting benign adrenal adenomas, although technically indeterminate. If MRI abdomen is performed, these can be definitively characterized at that time.  On 07/19/16 the patient consulted with Dr. Leighton Ruff of [facility]. Per her note, she recommends complete metastatic workup with MRI to evaluate liver, adrenal gland, and rectum. The patient will follow up with Dr. Marcello Moores following neoadjuvant therapy to discuss surgical resection.  The patient reports she has lost 30 lbs since January. She reports ongoing fatigue, which her PCP related to iron deficiency and vitamin D deficiency. She reports occasional incontinence, and irregular bowel activity. She reports ongoing bright red rectal bleeding. She denies cramps or pain. She takes 2 iron pills daily.  CURRENT THERAPY: Concurrent chemoradiation with Xeloda, 1500mg  twice daily Mon-Fri started 08/04/16  INTERVAL HISTORY:  [name]  Is here for a follow-up. She presents to the clinic today reporting she gained two pounds. She has been tolerating radiation so far. She was told to expect some symptoms soon. She denies nausea but has medication just in case. Her rectal bleeding is still the same. She is tolerating Xeloda fine. She denies numbness and tingling.    MEDICAL HISTORY:  Past Medical History:  Diagnosis Date  . Coronary artery disease 07/2003  . Myocardial infarction ([place])   . Obesity 2003   s/p gastric bypass     SURGICAL HISTORY: Past Surgical History:  Procedure Laterality Date  . CHOLECYSTECTOMY  1999  . GASTRIC BYPASS  2003    SOCIAL HISTORY: Social History   Social History  . Marital status: Single    Spouse name:  N/A  . Number of children: 0  . Years of education: N/A   Occupational History  . retired    Social History Main Topics  . Smoking status: Never Smoker  . Smokeless tobacco: Never Used  . Alcohol use No  . Drug use: No  . Sexual activity: Not on file   Other Topics Concern  . Not on file   Social History Narrative  . No narrative on file    FAMILY HISTORY: Family History  Problem Relation Age of Onset  . Heart attack Mother        d.93  . Throat cancer Mother 68  . Clotting disorder Mother   . Liver disease Mother   . Kidney disease Mother   . Heart attack Father        d.92  . Prostate cancer Father 33  . Uterine cancer Sister 55       treated with total hysterectomy and radiation  . Thyroid cancer Sister 17       treated with thyroidectomy  . Cancer Maternal Uncle        d.80s unspecified type of cancer. History of smoking.  Marland Kitchen Uterine cancer Sister 59  . Colon cancer Cousin 3       paternal first-cousin    ALLERGIES:  is allergic to bactrim [sulfamethoxazole-trimethoprim]; phenergan [promethazine hcl]; sulfa antibiotics; and penicillins.  MEDICATIONS:  Current Outpatient Prescriptions  Medication Sig Dispense Refill  . capecitabine (XELODA) 500 MG tablet Take 3 tablets (  1,500 mg total) by mouth 2 (two) times daily after a meal. Take on days of radiation only 168 tablet 0  . cholecalciferol (VITAMIN D) 1000 UNITS tablet Take 1,000 Units by mouth daily.      Marland Kitchen dicyclomine (BENTYL) 10 MG capsule Take 1 tab twice daily as needed for cramps. 30 capsule 1  . ferrous sulfate (FERROUSUL) 325 (65 FE) MG tablet Take 1 tablet (325 mg total) by mouth daily with breakfast. (Patient taking differently: Take 325 mg by mouth. Take two by mouth daily) 30 tablet 6  . Ibuprofen-Diphenhydramine Cit (ADVIL PM) 200-38 MG TABS Take 1 tablet by mouth daily after supper.    . Vitamin D, Ergocalciferol, (DRISDOL) 50000 units CAPS capsule Take 1 capsule (50,000 Units total) by mouth  every 7 (seven) days. 12 capsule 0  . Multiple Vitamin (MULTIVITAMIN) tablet Take 1 tablet by mouth daily.    . ondansetron (ZOFRAN) 8 MG tablet Take 1 tablet (8 mg total) by mouth every 8 (eight) hours as needed for nausea or vomiting. (Patient not taking: Reported on 08/16/2016) 20 tablet 1  . prochlorperazine (COMPAZINE) 10 MG tablet Take 1 tablet (10 mg total) by mouth every 6 (six) hours as needed for nausea or vomiting. (Patient not taking: Reported on 08/16/2016) 30 tablet 1   Current Facility-Administered Medications  Medication Dose Route Frequency Provider Last Rate Last Dose  . 0.9 %  sodium chloride infusion  500 mL Intravenous Continuous Irene Shipper, MD        REVIEW OF SYSTEMS:  Constitutional: Denies fevers, chills or abnormal night sweats, (+) ongoing fatigue Eyes: Denies blurriness of vision, double vision or watery eyes Ears, nose, mouth, throat, and face: Denies mucositis or sore throat Respiratory: Denies cough, dyspnea or wheezes Cardiovascular: Denies palpitation, chest discomfort or lower extremity swelling Gastrointestinal:  Denies nausea, heartburn, (+) incontinence, hematochezia, irregular bowel activity (+) rectal bleeding Skin: Denies abnormal skin rashes Lymphatics: Denies new lymphadenopathy or easy bruising Neurological:Denies numbness, tingling or new weaknesses Behavioral/Psych: Mood is stable, no new changes  All other systems were reviewed with the patient and are negative.  PHYSICAL EXAMINATION: ECOG PERFORMANCE STATUS: 1 - Symptomatic but completely ambulatory Vitals:   08/16/16 1434  BP: 122/90  Pulse: 70  Resp: 20  Temp: 98.7 F (37.1 C)  TempSrc: Oral  SpO2: 99%  Weight: 148 lb 8 oz (67.4 kg)  Height: 5' (1.524 m)    GENERAL:alert, no distress and comfortable SKIN: skin color, texture, turgor are normal, no rashes or significant lesions EYES: normal, conjunctiva are pink and non-injected, sclera clear OROPHARYNX:no exudate, no erythema  and lips, buccal mucosa, and tongue normal NECK: supple, thyroid normal size, non-tender, without nodularity LYMPH:  no palpable lymphadenopathy in the cervical, axillary or inguinal LUNGS: clear to auscultation and percussion with normal breathing effort HEART: regular rate & rhythm and no murmurs and no lower extremity edema ABDOMEN:abdomen soft, non-tender, and normal bowel sounds. No organomegaly. Musculoskeletal:no cyanosis of digits and no clubbing PSYCH: alert & oriented x 3 with fluent speech NEURO: no focal motor/sensory deficits Rectal Exam: deferred today    LABORATORY DATA:  I have reviewed the data as listed. CBC Latest Ref Rng & Units 08/16/2016 08/04/2016 07/20/2016  WBC 3.9 - 10.3 10e3/uL 6.4 7.7 9.1  Hemoglobin 11.6 - 15.9 g/dL 11.1(L) 12.0 11.7  Hematocrit 34.8 - 46.6 % 34.2(L) 37.7 36.0  Platelets 145 - 400 10e3/uL 237 246 308   CMP Latest Ref Rng & Units 08/16/2016 08/04/2016 07/09/2016  Glucose 70 - 140 mg/dl 103 83 79  BUN 7.0 - 26.0 mg/dL 18.5 20.9 12  Creatinine 0.6 - 1.1 mg/dL 0.6 0.6 0.52  Sodium 136 - 145 mEq/L 142 143 143  Potassium 3.5 - 5.1 mEq/L 3.9 3.6 3.6  Chloride 96 - 112 mEq/L - - 107  CO2 22 - 29 mEq/L 27 22 25   Calcium 8.4 - 10.4 mg/dL 8.8 9.3 8.7  Total Protein 6.4 - 8.3 g/dL 5.4(L) 6.0(L) 6.1  Total Bilirubin 0.20 - 1.20 mg/dL 0.28 0.28 0.4  Alkaline Phos 40 - 150 U/L 72 82 84  AST 5 - 34 U/L 23 21 30   ALT 0 - 55 U/L 31 28 32    PATHOLOGY: Diagnosis Surgical [P], rectum mass - INVASIVE ADENOCARCINOMA. - SEE COMMENT. Microscopic Comment Dr. Vicente Males has reviewed the case and concurs with this interpretation. Dr. Henrene Pastor was paged on 07/13/2016. (JBK:kh 07/13/16)  RADIOGRAPHIC STUDIES: I have personally reviewed the radiological images as listed and agreed with the findings in the report. Mr Pelvis W Wo Contrast  Result Date: 08/03/2016 CLINICAL DATA:  Rectal cancer.  Staging. EXAM: MRI ABDOMEN AND PELVIS WITHOUT AND WITH CONTRAST  TECHNIQUE: Multiplanar multisequence MR imaging of the abdomen and pelvis was performed both before and after the administration of intravenous contrast. CONTRAST:  65mL MULTIHANCE GADOBENATE DIMEGLUMINE 529 MG/ML IV SOLN COMPARISON:  07/12/2016. FINDINGS: COMBINED FINDINGS FOR BOTH MR ABDOMEN AND PELVIS Lower chest: No acute findings. Hepatobiliary: Within the anterior dome of liver there is a small arterial phase enhancing structure measuring 8 mm, image 11 of series 11901. This becomes isointense to liver on the portal venous and delayed phase images. There is a focal area of signal dropout within segment 4 B which corresponds to the hypodense lesion on previous exam from 07/12/2016 compatible with focal fatty deposition. Previous cholecystectomy. Mild increase caliber of the CBD which measures 7 mm. No obstructing stone or mass noted. Pancreas: No mass, inflammatory changes, or other parenchymal abnormality identified. Spleen:  Within normal limits in size and appearance. Adrenals/Urinary Tract: Small bilateral adrenal nodules show loss of signal on the out of phase sequences compatible with benign adenomas. No kidney mass identified. Urinary bladder is unremarkable. Stomach/Bowel: The stomach is normal. No pathologic dilatation of the large or small bowel loops. Rectal mass is again noted. This measures 5.3 x 4.1 x 6.1 cm. This extends to the level of the an anorectal angle compatible with a low rectal cancer. The lesion extends beyond the muscularis propria by at least 2.4 cm. No surrounding lymph nodes identified. Vascular/Lymphatic: No pathologically enlarged lymph nodes identified. No abdominal aortic aneurysm demonstrated. Reproductive: Uterus and bilateral adnexa are unremarkable. Other:  None. Musculoskeletal: No suspicious bone lesions identified. IMPRESSION: 1. Large rectal mass measures 6.1 cm in length. No obstruction identified. This is compatible with at least a T3bN0M0 lesion. 2. No specific  features highly suspicious for nodal metastasis or metastatic disease to the upper abdomen. 3. Indeterminate arterial phase enhancing lesion within the anterior dome of liver measures less than 1 cm. This may represent a benign liver lesions such as FNH or adenoma. Less favored with the hypervascular liver metastasis. Followup imaging at 6 months with repeat MRI of the liver is advise. 4. Bilateral adrenal adenomas. Electronically Signed   By: Kerby Moors M.D.   On: 08/03/2016 10:05   Mr Abdomen W Wo Contrast  Result Date: 08/03/2016 CLINICAL DATA:  Rectal cancer.  Staging. EXAM: MRI ABDOMEN AND PELVIS WITHOUT AND WITH  CONTRAST TECHNIQUE: Multiplanar multisequence MR imaging of the abdomen and pelvis was performed both before and after the administration of intravenous contrast. CONTRAST:  49mL MULTIHANCE GADOBENATE DIMEGLUMINE 529 MG/ML IV SOLN COMPARISON:  07/12/2016. FINDINGS: COMBINED FINDINGS FOR BOTH MR ABDOMEN AND PELVIS Lower chest: No acute findings. Hepatobiliary: Within the anterior dome of liver there is a small arterial phase enhancing structure measuring 8 mm, image 11 of series 11901. This becomes isointense to liver on the portal venous and delayed phase images. There is a focal area of signal dropout within segment 4 B which corresponds to the hypodense lesion on previous exam from 07/12/2016 compatible with focal fatty deposition. Previous cholecystectomy. Mild increase caliber of the CBD which measures 7 mm. No obstructing stone or mass noted. Pancreas: No mass, inflammatory changes, or other parenchymal abnormality identified. Spleen:  Within normal limits in size and appearance. Adrenals/Urinary Tract: Small bilateral adrenal nodules show loss of signal on the out of phase sequences compatible with benign adenomas. No kidney mass identified. Urinary bladder is unremarkable. Stomach/Bowel: The stomach is normal. No pathologic dilatation of the large or small bowel loops. Rectal mass is  again noted. This measures 5.3 x 4.1 x 6.1 cm. This extends to the level of the an anorectal angle compatible with a low rectal cancer. The lesion extends beyond the muscularis propria by at least 2.4 cm. No surrounding lymph nodes identified. Vascular/Lymphatic: No pathologically enlarged lymph nodes identified. No abdominal aortic aneurysm demonstrated. Reproductive: Uterus and bilateral adnexa are unremarkable. Other:  None. Musculoskeletal: No suspicious bone lesions identified. IMPRESSION: 1. Large rectal mass measures 6.1 cm in length. No obstruction identified. This is compatible with at least a T3bN0M0 lesion. 2. No specific features highly suspicious for nodal metastasis or metastatic disease to the upper abdomen. 3. Indeterminate arterial phase enhancing lesion within the anterior dome of liver measures less than 1 cm. This may represent a benign liver lesions such as FNH or adenoma. Less favored with the hypervascular liver metastasis. Followup imaging at 6 months with repeat MRI of the liver is advise. 4. Bilateral adrenal adenomas. Electronically Signed   By: Kerby Moors M.D.   On: 08/03/2016 10:05    ASSESSMENT & PLAN:  Mehek Grega is a 66 y.o. woman with past medical history of gastric bypass surgery, presented with rectal bleeding, loose bowel movement and stool incontinence for one half years, was recently diagnosed invasive rectal adenocarcinoma.  1. Invasive low rectal adenocarcinoma, cT3N0M0 -I reviewed his CT scan findings, and surgical pathology results in great details with patient and her friend.  -I discussed her abdominal and pelvic MRI scan findings, which showed a T3 N0 rectal tumor. The liver lesion is indeterminate, does not have possibility of metastasis. We'll continue observation, and repeat scan in 3-6 months. -Given her T3 disease, I recommend neoadjuvant chemotherapy and irradiation, followed by surgery and adjuvant chemotherapy. -We discussed the role of  neoadjuvant chemotherapy for colorectal cancer, to shrink the tumor and reduce the risk of cancer recurrence after surgery -She has seen colorectal surgeon Dr. Marcello Moores, surgical option of APR vs low anterior resection were discussed, Dr. Marcello Moores feels she needs permanent colostomy. Due to her prior gastric bypass surgery, however patient is very reluctant to have surgery due to the need of colostomy bag. -Patient agrees to proceed with concurrent chemotherapy and radiation. She has received Xeloda, knows how to take it. Potential side effects reviewed with her again. -continue monitor her CBC and CMP weekly, and I'll see her  every other week when she is on concurrent chemotherapy and radiation. -Labs reviewed and HG is slightly down at 11.1 but overall her CMP an CBC are within normal range. She is currently tolerating chemoradiation very well, we'll continue treatment. -I will f/u in 2 weeks    2. Iron Deficient Anemia - The patient takes 2 iron supplements daily. - Her iron has been somewhat low. - She will stop taking aspirin to reduce her bleeding risk. -Repeat iron studies showed iron deficiency, she will receive 1 dose IV iron Feraheme today. -Her levels are improved now, Ferritin is 272 (08/13/16)  3. Genetics -Patient has 2 sisters who had uterine cancer, this is suspicious for lynch syndrome. - I encouraged the patient to pursue genetic testing based on her family history. This will determine if the patient carries deleterious genes or is at risk for lynch syndrome. - The patient is in agreement and I will refer her to these services.  4. History of gastric bypass surgery in 2003 -for obesity   5. History of DM and HTN -Resolved after her gastric bypass surgery. -We'll monitor her blood pressure and glucose closely during her chemotherapy treatment.  6. Liver lesion -Indeterminate, no high suspicion for metastatic disease, plan to repeat liver MRI in 3-6 months.  PLAN: -lab  weekly  -I'll see her back in 2 weeks.  No orders of the defined types were placed in this encounter.  All questions were answered. The patient knows to call the clinic with any problems, questions or concerns.  I spent 15 minutes counseling the patient face to face. The total time spent in the appointment was 20 minutes and more than 50% was on counseling.  This document serves as a record of services personally performed by Truitt Merle, MD. It was created on her behalf by Joslyn Devon, a trained medical scribe. The creation of this record is based on the scribe's personal observations and the provider's statements to them. This document has been checked and approved by the attending provider.     Truitt Merle, MD 08/16/2016

## 2016-08-16 ENCOUNTER — Encounter: Payer: Self-pay | Admitting: Hematology

## 2016-08-16 ENCOUNTER — Other Ambulatory Visit (HOSPITAL_BASED_OUTPATIENT_CLINIC_OR_DEPARTMENT_OTHER): Payer: Medicare HMO

## 2016-08-16 ENCOUNTER — Ambulatory Visit
Admission: RE | Admit: 2016-08-16 | Discharge: 2016-08-16 | Disposition: A | Discharge status: Home / Self Care | Payer: Medicare HMO | Source: Ambulatory Visit | Attending: Radiation Oncology | Admitting: Radiation Oncology

## 2016-08-16 ENCOUNTER — Ambulatory Visit (HOSPITAL_BASED_OUTPATIENT_CLINIC_OR_DEPARTMENT_OTHER): Payer: Medicare HMO | Admitting: Hematology

## 2016-08-16 VITALS — BP 122/90 | HR 70 | Temp 98.7°F | Resp 20 | Ht 60.0 in | Wt 148.5 lb

## 2016-08-16 DIAGNOSIS — D500 Iron deficiency anemia secondary to blood loss (chronic): Secondary | ICD-10-CM

## 2016-08-16 DIAGNOSIS — Z510 Encounter for antineoplastic radiation therapy: Secondary | ICD-10-CM | POA: Diagnosis not present

## 2016-08-16 DIAGNOSIS — C20 Malignant neoplasm of rectum: Secondary | ICD-10-CM

## 2016-08-16 DIAGNOSIS — Z808 Family history of malignant neoplasm of other organs or systems: Secondary | ICD-10-CM | POA: Diagnosis not present

## 2016-08-16 DIAGNOSIS — Z833 Family history of diabetes mellitus: Secondary | ICD-10-CM | POA: Diagnosis not present

## 2016-08-16 LAB — COMPREHENSIVE METABOLIC PANEL
ALBUMIN: 2.8 g/dL — AB (ref 3.5–5.0)
ALK PHOS: 72 U/L (ref 40–150)
ALT: 31 U/L (ref 0–55)
ANION GAP: 7 meq/L (ref 3–11)
AST: 23 U/L (ref 5–34)
BILIRUBIN TOTAL: 0.28 mg/dL (ref 0.20–1.20)
BUN: 18.5 mg/dL (ref 7.0–26.0)
CO2: 27 mEq/L (ref 22–29)
Calcium: 8.8 mg/dL (ref 8.4–10.4)
Chloride: 109 mEq/L (ref 98–109)
Creatinine: 0.6 mg/dL (ref 0.6–1.1)
GLUCOSE: 103 mg/dL (ref 70–140)
Potassium: 3.9 mEq/L (ref 3.5–5.1)
Sodium: 142 mEq/L (ref 136–145)
TOTAL PROTEIN: 5.4 g/dL — AB (ref 6.4–8.3)

## 2016-08-16 LAB — CBC & DIFF AND RETIC
BASO%: 0.2 % (ref 0.0–2.0)
Basophils Absolute: 0 10*3/uL (ref 0.0–0.1)
EOS%: 0.8 % (ref 0.0–7.0)
Eosinophils Absolute: 0.1 10*3/uL (ref 0.0–0.5)
HCT: 34.2 % — ABNORMAL LOW (ref 34.8–46.6)
HGB: 11.1 g/dL — ABNORMAL LOW (ref 11.6–15.9)
Immature Retic Fract: 10.5 % — ABNORMAL HIGH (ref 1.60–10.00)
LYMPH%: 10.2 % — ABNORMAL LOW (ref 14.0–49.7)
MCH: 29.8 pg (ref 25.1–34.0)
MCHC: 32.5 g/dL (ref 31.5–36.0)
MCV: 91.9 fL (ref 79.5–101.0)
MONO#: 0.4 10*3/uL (ref 0.1–0.9)
MONO%: 5.8 % (ref 0.0–14.0)
NEUT%: 83 % — ABNORMAL HIGH (ref 38.4–76.8)
NEUTROS ABS: 5.3 10*3/uL (ref 1.5–6.5)
Platelets: 237 10*3/uL (ref 145–400)
RBC: 3.72 10*6/uL (ref 3.70–5.45)
RDW: 13.7 % (ref 11.2–14.5)
RETIC %: 1.58 % (ref 0.70–2.10)
Retic Ct Abs: 58.78 10*3/uL (ref 33.70–90.70)
WBC: 6.4 10*3/uL (ref 3.9–10.3)
lymph#: 0.7 10*3/uL — ABNORMAL LOW (ref 0.9–3.3)

## 2016-08-16 LAB — IRON AND TIBC
%SAT: 35 % (ref 21–57)
IRON: 87 ug/dL (ref 41–142)
TIBC: 245 ug/dL (ref 236–444)
UIBC: 158 ug/dL (ref 120–384)

## 2016-08-16 LAB — FERRITIN: FERRITIN: 272 ng/mL — AB (ref 9–269)

## 2016-08-17 ENCOUNTER — Ambulatory Visit
Admission: RE | Admit: 2016-08-17 | Discharge: 2016-08-17 | Disposition: A | Discharge status: Home / Self Care | Payer: Medicare HMO | Source: Ambulatory Visit | Attending: Radiation Oncology | Admitting: Radiation Oncology

## 2016-08-17 DIAGNOSIS — C20 Malignant neoplasm of rectum: Secondary | ICD-10-CM | POA: Diagnosis not present

## 2016-08-17 DIAGNOSIS — Z833 Family history of diabetes mellitus: Secondary | ICD-10-CM | POA: Diagnosis not present

## 2016-08-17 DIAGNOSIS — D500 Iron deficiency anemia secondary to blood loss (chronic): Secondary | ICD-10-CM | POA: Diagnosis not present

## 2016-08-17 DIAGNOSIS — Z510 Encounter for antineoplastic radiation therapy: Secondary | ICD-10-CM | POA: Diagnosis not present

## 2016-08-17 DIAGNOSIS — Z808 Family history of malignant neoplasm of other organs or systems: Secondary | ICD-10-CM | POA: Diagnosis not present

## 2016-08-18 ENCOUNTER — Telehealth: Payer: Self-pay | Admitting: Hematology

## 2016-08-18 ENCOUNTER — Ambulatory Visit
Admission: RE | Admit: 2016-08-18 | Discharge: 2016-08-18 | Disposition: A | Discharge status: Home / Self Care | Payer: Medicare HMO | Source: Ambulatory Visit | Attending: Radiation Oncology | Admitting: Radiation Oncology

## 2016-08-18 DIAGNOSIS — Z510 Encounter for antineoplastic radiation therapy: Secondary | ICD-10-CM | POA: Diagnosis not present

## 2016-08-18 DIAGNOSIS — D500 Iron deficiency anemia secondary to blood loss (chronic): Secondary | ICD-10-CM | POA: Diagnosis not present

## 2016-08-18 DIAGNOSIS — C20 Malignant neoplasm of rectum: Secondary | ICD-10-CM | POA: Diagnosis not present

## 2016-08-18 DIAGNOSIS — Z833 Family history of diabetes mellitus: Secondary | ICD-10-CM | POA: Diagnosis not present

## 2016-08-18 DIAGNOSIS — Z808 Family history of malignant neoplasm of other organs or systems: Secondary | ICD-10-CM | POA: Diagnosis not present

## 2016-08-18 NOTE — Telephone Encounter (Signed)
Called patient regarding appointment for September.

## 2016-08-19 ENCOUNTER — Ambulatory Visit
Admission: RE | Admit: 2016-08-19 | Discharge: 2016-08-19 | Disposition: A | Discharge status: Home / Self Care | Payer: Medicare HMO | Source: Ambulatory Visit | Attending: Radiation Oncology | Admitting: Radiation Oncology

## 2016-08-19 DIAGNOSIS — C20 Malignant neoplasm of rectum: Secondary | ICD-10-CM | POA: Diagnosis not present

## 2016-08-19 DIAGNOSIS — Z510 Encounter for antineoplastic radiation therapy: Secondary | ICD-10-CM | POA: Diagnosis not present

## 2016-08-19 DIAGNOSIS — D500 Iron deficiency anemia secondary to blood loss (chronic): Secondary | ICD-10-CM | POA: Diagnosis not present

## 2016-08-19 DIAGNOSIS — Z833 Family history of diabetes mellitus: Secondary | ICD-10-CM | POA: Diagnosis not present

## 2016-08-19 DIAGNOSIS — Z808 Family history of malignant neoplasm of other organs or systems: Secondary | ICD-10-CM | POA: Diagnosis not present

## 2016-08-20 ENCOUNTER — Ambulatory Visit
Admission: RE | Admit: 2016-08-20 | Discharge: 2016-08-20 | Disposition: A | Discharge status: Home / Self Care | Payer: Medicare HMO | Source: Ambulatory Visit | Attending: Radiation Oncology | Admitting: Radiation Oncology

## 2016-08-20 DIAGNOSIS — D500 Iron deficiency anemia secondary to blood loss (chronic): Secondary | ICD-10-CM | POA: Diagnosis not present

## 2016-08-20 DIAGNOSIS — Z510 Encounter for antineoplastic radiation therapy: Secondary | ICD-10-CM | POA: Diagnosis not present

## 2016-08-20 DIAGNOSIS — Z833 Family history of diabetes mellitus: Secondary | ICD-10-CM | POA: Diagnosis not present

## 2016-08-20 DIAGNOSIS — C20 Malignant neoplasm of rectum: Secondary | ICD-10-CM | POA: Diagnosis not present

## 2016-08-20 DIAGNOSIS — Z808 Family history of malignant neoplasm of other organs or systems: Secondary | ICD-10-CM | POA: Diagnosis not present

## 2016-08-23 ENCOUNTER — Other Ambulatory Visit: Payer: Self-pay | Admitting: Family Medicine

## 2016-08-23 ENCOUNTER — Other Ambulatory Visit: Payer: Medicare HMO

## 2016-08-23 ENCOUNTER — Ambulatory Visit
Admission: RE | Admit: 2016-08-23 | Discharge: 2016-08-23 | Disposition: A | Discharge status: Home / Self Care | Payer: Medicare HMO | Source: Ambulatory Visit | Attending: Radiation Oncology | Admitting: Radiation Oncology

## 2016-08-23 DIAGNOSIS — E559 Vitamin D deficiency, unspecified: Secondary | ICD-10-CM

## 2016-08-23 DIAGNOSIS — Z833 Family history of diabetes mellitus: Secondary | ICD-10-CM | POA: Diagnosis not present

## 2016-08-23 DIAGNOSIS — Z510 Encounter for antineoplastic radiation therapy: Secondary | ICD-10-CM | POA: Diagnosis not present

## 2016-08-23 DIAGNOSIS — Z808 Family history of malignant neoplasm of other organs or systems: Secondary | ICD-10-CM | POA: Diagnosis not present

## 2016-08-23 DIAGNOSIS — C20 Malignant neoplasm of rectum: Secondary | ICD-10-CM | POA: Diagnosis not present

## 2016-08-23 DIAGNOSIS — D500 Iron deficiency anemia secondary to blood loss (chronic): Secondary | ICD-10-CM | POA: Diagnosis not present

## 2016-08-24 ENCOUNTER — Ambulatory Visit
Admission: RE | Admit: 2016-08-24 | Discharge: 2016-08-24 | Disposition: A | Discharge status: Home / Self Care | Payer: Medicare HMO | Source: Ambulatory Visit | Attending: Radiation Oncology | Admitting: Radiation Oncology

## 2016-08-24 DIAGNOSIS — C20 Malignant neoplasm of rectum: Secondary | ICD-10-CM | POA: Diagnosis not present

## 2016-08-24 DIAGNOSIS — D500 Iron deficiency anemia secondary to blood loss (chronic): Secondary | ICD-10-CM | POA: Diagnosis not present

## 2016-08-24 DIAGNOSIS — Z833 Family history of diabetes mellitus: Secondary | ICD-10-CM | POA: Diagnosis not present

## 2016-08-24 DIAGNOSIS — Z808 Family history of malignant neoplasm of other organs or systems: Secondary | ICD-10-CM | POA: Diagnosis not present

## 2016-08-24 DIAGNOSIS — Z510 Encounter for antineoplastic radiation therapy: Secondary | ICD-10-CM | POA: Diagnosis not present

## 2016-08-25 ENCOUNTER — Ambulatory Visit
Admission: RE | Admit: 2016-08-25 | Discharge: 2016-08-25 | Disposition: A | Discharge status: Home / Self Care | Payer: Medicare HMO | Source: Ambulatory Visit | Attending: Radiation Oncology | Admitting: Radiation Oncology

## 2016-08-25 DIAGNOSIS — Z808 Family history of malignant neoplasm of other organs or systems: Secondary | ICD-10-CM | POA: Diagnosis not present

## 2016-08-25 DIAGNOSIS — Z510 Encounter for antineoplastic radiation therapy: Secondary | ICD-10-CM | POA: Diagnosis not present

## 2016-08-25 DIAGNOSIS — C20 Malignant neoplasm of rectum: Secondary | ICD-10-CM | POA: Diagnosis not present

## 2016-08-25 DIAGNOSIS — Z833 Family history of diabetes mellitus: Secondary | ICD-10-CM | POA: Diagnosis not present

## 2016-08-25 DIAGNOSIS — D500 Iron deficiency anemia secondary to blood loss (chronic): Secondary | ICD-10-CM | POA: Diagnosis not present

## 2016-08-26 ENCOUNTER — Ambulatory Visit
Admission: RE | Admit: 2016-08-26 | Discharge: 2016-08-26 | Disposition: A | Discharge status: Home / Self Care | Payer: Medicare HMO | Source: Ambulatory Visit | Attending: Radiation Oncology | Admitting: Radiation Oncology

## 2016-08-26 DIAGNOSIS — D500 Iron deficiency anemia secondary to blood loss (chronic): Secondary | ICD-10-CM | POA: Diagnosis not present

## 2016-08-26 DIAGNOSIS — Z808 Family history of malignant neoplasm of other organs or systems: Secondary | ICD-10-CM | POA: Diagnosis not present

## 2016-08-26 DIAGNOSIS — Z833 Family history of diabetes mellitus: Secondary | ICD-10-CM | POA: Diagnosis not present

## 2016-08-26 DIAGNOSIS — C20 Malignant neoplasm of rectum: Secondary | ICD-10-CM | POA: Diagnosis not present

## 2016-08-26 DIAGNOSIS — Z510 Encounter for antineoplastic radiation therapy: Secondary | ICD-10-CM | POA: Diagnosis not present

## 2016-08-27 ENCOUNTER — Ambulatory Visit
Admission: RE | Admit: 2016-08-27 | Discharge: 2016-08-27 | Disposition: A | Discharge status: Home / Self Care | Payer: Medicare HMO | Source: Ambulatory Visit | Attending: Radiation Oncology | Admitting: Radiation Oncology

## 2016-08-27 DIAGNOSIS — C20 Malignant neoplasm of rectum: Secondary | ICD-10-CM | POA: Diagnosis not present

## 2016-08-27 DIAGNOSIS — D500 Iron deficiency anemia secondary to blood loss (chronic): Secondary | ICD-10-CM | POA: Diagnosis not present

## 2016-08-27 DIAGNOSIS — Z833 Family history of diabetes mellitus: Secondary | ICD-10-CM | POA: Diagnosis not present

## 2016-08-27 DIAGNOSIS — Z808 Family history of malignant neoplasm of other organs or systems: Secondary | ICD-10-CM | POA: Diagnosis not present

## 2016-08-27 DIAGNOSIS — Z510 Encounter for antineoplastic radiation therapy: Secondary | ICD-10-CM | POA: Diagnosis not present

## 2016-08-30 ENCOUNTER — Encounter: Payer: Self-pay | Admitting: Hematology

## 2016-08-30 ENCOUNTER — Ambulatory Visit
Admission: RE | Admit: 2016-08-30 | Discharge: 2016-08-30 | Disposition: A | Discharge status: Home / Self Care | Payer: Medicare HMO | Source: Ambulatory Visit | Attending: Radiation Oncology | Admitting: Radiation Oncology

## 2016-08-30 ENCOUNTER — Ambulatory Visit (HOSPITAL_BASED_OUTPATIENT_CLINIC_OR_DEPARTMENT_OTHER): Payer: Medicare HMO | Admitting: Hematology

## 2016-08-30 ENCOUNTER — Other Ambulatory Visit (HOSPITAL_BASED_OUTPATIENT_CLINIC_OR_DEPARTMENT_OTHER): Payer: Medicare HMO

## 2016-08-30 VITALS — BP 132/61 | HR 67 | Temp 98.5°F | Resp 18 | Ht 60.0 in | Wt 143.0 lb

## 2016-08-30 DIAGNOSIS — Z9884 Bariatric surgery status: Secondary | ICD-10-CM | POA: Diagnosis not present

## 2016-08-30 DIAGNOSIS — Z833 Family history of diabetes mellitus: Secondary | ICD-10-CM | POA: Diagnosis not present

## 2016-08-30 DIAGNOSIS — D500 Iron deficiency anemia secondary to blood loss (chronic): Secondary | ICD-10-CM

## 2016-08-30 DIAGNOSIS — K769 Liver disease, unspecified: Secondary | ICD-10-CM | POA: Diagnosis not present

## 2016-08-30 DIAGNOSIS — E876 Hypokalemia: Secondary | ICD-10-CM

## 2016-08-30 DIAGNOSIS — Z808 Family history of malignant neoplasm of other organs or systems: Secondary | ICD-10-CM | POA: Diagnosis not present

## 2016-08-30 DIAGNOSIS — C20 Malignant neoplasm of rectum: Secondary | ICD-10-CM

## 2016-08-30 DIAGNOSIS — Z510 Encounter for antineoplastic radiation therapy: Secondary | ICD-10-CM | POA: Diagnosis not present

## 2016-08-30 LAB — CBC & DIFF AND RETIC
BASO%: 0.3 % (ref 0.0–2.0)
BASOS ABS: 0 10*3/uL (ref 0.0–0.1)
EOS ABS: 0.6 10*3/uL — AB (ref 0.0–0.5)
EOS%: 8.3 % — ABNORMAL HIGH (ref 0.0–7.0)
HEMATOCRIT: 34.1 % — AB (ref 34.8–46.6)
HEMOGLOBIN: 11.2 g/dL — AB (ref 11.6–15.9)
IMMATURE RETIC FRACT: 12.9 % — AB (ref 1.60–10.00)
LYMPH#: 0.5 10*3/uL — AB (ref 0.9–3.3)
LYMPH%: 7.3 % — AB (ref 14.0–49.7)
MCH: 30.3 pg (ref 25.1–34.0)
MCHC: 32.8 g/dL (ref 31.5–36.0)
MCV: 92.2 fL (ref 79.5–101.0)
MONO#: 0.7 10*3/uL (ref 0.1–0.9)
MONO%: 10.5 % (ref 0.0–14.0)
NEUT#: 5 10*3/uL (ref 1.5–6.5)
NEUT%: 73.6 % (ref 38.4–76.8)
PLATELETS: 193 10*3/uL (ref 145–400)
RBC: 3.7 10*6/uL (ref 3.70–5.45)
RDW: 15 % — ABNORMAL HIGH (ref 11.2–14.5)
RETIC CT ABS: 61.79 10*3/uL (ref 33.70–90.70)
Retic %: 1.67 % (ref 0.70–2.10)
WBC: 6.9 10*3/uL (ref 3.9–10.3)

## 2016-08-30 LAB — IRON AND TIBC
%SAT: 28 % (ref 21–57)
Iron: 71 ug/dL (ref 41–142)
TIBC: 253 ug/dL (ref 236–444)
UIBC: 182 ug/dL (ref 120–384)

## 2016-08-30 LAB — COMPREHENSIVE METABOLIC PANEL
ALT: 23 U/L (ref 0–55)
AST: 21 U/L (ref 5–34)
Albumin: 2.8 g/dL — ABNORMAL LOW (ref 3.5–5.0)
Alkaline Phosphatase: 70 U/L (ref 40–150)
Anion Gap: 8 mEq/L (ref 3–11)
BUN: 16.6 mg/dL (ref 7.0–26.0)
CALCIUM: 8.9 mg/dL (ref 8.4–10.4)
CHLORIDE: 110 meq/L — AB (ref 98–109)
CO2: 24 meq/L (ref 22–29)
Creatinine: 0.7 mg/dL (ref 0.6–1.1)
EGFR: >90 mL/min/{1.73_m2} (ref >90)
Glucose: 84 mg/dl (ref 70–140)
POTASSIUM: 3.1 meq/L — AB (ref 3.5–5.1)
SODIUM: 141 meq/L (ref 136–145)
Total Bilirubin: 0.28 mg/dL (ref 0.20–1.20)
Total Protein: 5.7 g/dL — ABNORMAL LOW (ref 6.4–8.3)

## 2016-08-30 LAB — FERRITIN: Ferritin: 170 ng/ml (ref 9–269)

## 2016-08-30 MED ORDER — POTASSIUM CHLORIDE 20 MEQ PO PACK: 20.0000 meq | PACK | Freq: Every day | ORAL | 1 refills | Status: DC

## 2016-08-30 MED ORDER — CAPECITABINE 500 MG PO TABS: 825.0000 mg/m2 | ORAL_TABLET | Freq: Two times a day (BID) | ORAL | 0 refills | Status: DC

## 2016-08-30 MED ORDER — TRAMADOL HCL 50 MG PO TABS: 50.0000 mg | ORAL_TABLET | Freq: Four times a day (QID) | ORAL | 0 refills | Status: DC | PRN

## 2016-08-30 NOTE — Progress Notes (Signed)
[facility]  [phone]) [phone] [phone]  Clinic Follow Up Note   Patient Care Team: Lucille Passy, MD as PCP - General (Family Medicine) Irene Shipper, MD as Consulting Physician (Gastroenterology) Leighton Ruff, MD as Consulting Physician (General Surgery) Truitt Merle, MD as Consulting Physician (Hematology) Kyung Rudd, MD as Consulting Physician (Radiation Oncology) 08/30/2016  CHIEF COMPLAINTS:  Follow up rectal Adenocarcinoma  Oncology History   Cancer Staging Rectal adenocarcinoma [facility]) Staging form: Colon and Rectum, AJCC 8th Edition - Clinical stage from 07/09/2016: Stage IIA (cT3, cN0, cM0) - Signed by Truitt Merle, MD on 08/16/2016       Rectal adenocarcinoma ([place])   07/09/2016 Pathology Results    Diagnosis Surgical [P], rectum mass - INVASIVE ADENOCARCINOMA. - SEE COMMENT.      07/09/2016 Procedure    Colonoscopy A non-obstructing large friable mass was found in the distal rectum. This began at approximately 1 cm above the anal verge and extended proximal for a distance of 8-10 cm. The lesion was bulky and involved approximately 75% of the luminal circumference. Multiple biopsies were taken. A few small-mouthed diverticula were found in the left colon. The exam was otherwise without abnormality on direct views. Retroflexion not performed purposely.      07/12/2016 Imaging    CT C/A/P with contrast IMPRESSION: Rectal wall thickening/mass, compatible with known rectal adenocarcinoma. No findings specific for metastatic disease in the chest, abdomen, or pelvis. 2.2 cm probable hemangioma in segment 4B, although poorly evaluated. Consider MRI abdomen with/ without multihance contrast for definitive characterization, as clinically warranted. Bilateral adrenal nodules measuring up to 1.6 cm on the right, likely reflecting benign adrenal adenomas, although technically indeterminate. If MRI abdomen is performed, these can be definitively  characterized at that time.      07/20/2016 Initial Diagnosis    Rectal adenocarcinoma ([place])     08/03/2016 Imaging    MRI AP W WO Contrast 08/03/16 IMPRESSION: 1. Large rectal mass measures 6.1 cm in length. No obstruction identified. This is compatible with at least a T3bN0M0 lesion. 2. No specific features highly suspicious for nodal metastasis or metastatic disease to the upper abdomen. 3. Indeterminate arterial phase enhancing lesion within the anterior dome of liver measures less than 1 cm. This may represent a benign liver lesions such as FNH or adenoma. Less favored with the hypervascular liver metastasis. Followup imaging at 6 months with repeat MRI of the liver is advise. 4. Bilateral adrenal adenomas      08/04/2016 -  Radiation Therapy    Concurrent chemo radiation with Dr. [name]       08/04/2016 -  Chemotherapy    Concurrent chemo radiation with Xeloda, 1500mg  twice daily       HISTORY OF PRESENTING ILLNESS: 07/20/16 [name] 66 y.o. female is here because of recently discovered invasive adenocarcinoma. She is accompanied by a friend today. The patient underwent colonoscopy on 07/09/16 with Dr. [name]. According to Dr. Blanch Media note from that encounter, the patient presented with complaints of rectal bleeding and iron deficiency anemia. She reports she was told she has hemorrhoids and initially was not concerned about the rectal bleeding. Her most recent prior colonoscopy was 2005. On exam, Dr. Henrene Pastor noted a non-obstructing large friable mass was found in the distal rectum. This began at approximately 1 cm above the anal verge and extended proximal for a distance of 8-10 cm. The lesion was bulky and involved approximately 75% of the luminal circumference. Biopsy of the  rectal mass on 07/09/16 showed invasive adenocarcinoma.  CT C/A/P on 07/12/16 revealed rectal wall thickening/mass, compatible with known rectal adenocarcinoma.No findings specific for metastatic disease  in the chest, abdomen, or pelvis. 2.2 cm probable hemangioma in segment 4B, although poorly evaluated. Consider MRI abdomen with/ without multihance contrast for definitive characterization, as clinically warranted. Bilateral adrenal nodules measuring up to 1.6 cm on the right, likely reflecting benign adrenal adenomas, although technically indeterminate. If MRI abdomen is performed, these can be definitively characterized at that time.  On 07/19/16 the patient consulted with Dr. Leighton Ruff of [facility]. Per her note, she recommends complete metastatic workup with MRI to evaluate liver, adrenal gland, and rectum. The patient will follow up with Dr. Marcello Moores following neoadjuvant therapy to discuss surgical resection.  The patient reports she has lost 30 lbs since January. She reports ongoing fatigue, which her PCP related to iron deficiency and vitamin D deficiency. She reports occasional incontinence, and irregular bowel activity. She reports ongoing bright red rectal bleeding. She denies cramps or pain. She takes 2 iron pills daily.  CURRENT THERAPY: Concurrent chemoradiation with Xeloda, 1500mg  twice daily Mon-Fri started 08/04/16   INTERVAL HISTORY:  Mehar Sagen is here for a follow-up. Pt would like to switch her surgery to Dr. Renelda Mom who has offered her surgical resection without a permanent colostomy. She will see her again on 09/24/16.  She is taking ibuprofen every 4-5 hours for her pain. No change in her rectal bleeding. She has not been taking tylenol and is advised to switch. Pt notes her pain is getting worse and would like prescription pain med. She reports having a a BM every 2 hours and notes she is unable to sleep. Her BM are sometimes loose and sometimes hard. Took imodium once and was told by Dr. [name] to not take it unless she needs too. Pt needs a refill on her chemo medication. She notes decreased appetite and she has lost weight since last visit. Pt endorses  soreness when wiping.    On review of systems, pt denies numbness, tingling, fever, chills, weight loss, decreased appetite, decreased energy levels. Denies pain. Pt denies abdominal pain, nausea, vomiting. Pt endorses diarrhea and rectal soreness.     MEDICAL HISTORY:  Past Medical History:  Diagnosis Date  . Coronary artery disease 07/2003  . Myocardial infarction ([place])   . Obesity 2003   s/p gastric bypass     SURGICAL HISTORY: Past Surgical History:  Procedure Laterality Date  . CHOLECYSTECTOMY  1999  . GASTRIC BYPASS  2003    SOCIAL HISTORY: Social History   Social History  . Marital status: Single    Spouse name: N/A  . Number of children: 0  . Years of education: N/A   Occupational History  . retired    Social History Main Topics  . Smoking status: Never Smoker  . Smokeless tobacco: Never Used  . Alcohol use No  . Drug use: No  . Sexual activity: Not on file   Other Topics Concern  . Not on file   Social History Narrative  . No narrative on file    FAMILY HISTORY: Family History  Problem Relation Age of Onset  . Heart attack Mother        d.93  . Throat cancer Mother 25  . Clotting disorder Mother   . Liver disease Mother   . Kidney disease Mother   . Heart attack Father  d.57  . Prostate cancer Father 37  . Uterine cancer Sister 59       treated with total hysterectomy and radiation  . Thyroid cancer Sister 64       treated with thyroidectomy  . Cancer Maternal Uncle        d.80s unspecified type of cancer. History of smoking.  Marland Kitchen Uterine cancer Sister 12  . Colon cancer Cousin 36       paternal first-cousin    ALLERGIES:  is allergic to bactrim [sulfamethoxazole-trimethoprim]; phenergan [promethazine hcl]; sulfa antibiotics; and penicillins.  MEDICATIONS:  Current Outpatient Prescriptions  Medication Sig Dispense Refill  . capecitabine (XELODA) 500 MG tablet Take 3 tablets (1,500 mg total) by mouth 2 (two) times daily after a  meal. Take on days of radiation only 50 tablet 0  . cholecalciferol (VITAMIN D) 1000 UNITS tablet Take 1,000 Units by mouth daily.      Marland Kitchen dicyclomine (BENTYL) 10 MG capsule Take 1 tab twice daily as needed for cramps. 30 capsule 1  . ferrous sulfate (FERROUSUL) 325 (65 FE) MG tablet Take 1 tablet (325 mg total) by mouth daily with breakfast. (Patient taking differently: Take 325 mg by mouth. Take two by mouth daily) 30 tablet 6  . Ibuprofen-Diphenhydramine Cit (ADVIL PM) 200-38 MG TABS Take 1 tablet by mouth every 5 (five) hours as needed.     . Multiple Vitamin (MULTIVITAMIN) tablet Take 1 tablet by mouth daily.    . ondansetron (ZOFRAN) 8 MG tablet Take 1 tablet (8 mg total) by mouth every 8 (eight) hours as needed for nausea or vomiting. (Patient not taking: Reported on 08/16/2016) 20 tablet 1  . potassium chloride (KLOR-CON) 20 MEQ packet Take 20 mEq by mouth daily. 30 packet 1  . prochlorperazine (COMPAZINE) 10 MG tablet Take 1 tablet (10 mg total) by mouth every 6 (six) hours as needed for nausea or vomiting. (Patient not taking: Reported on 08/16/2016) 30 tablet 1  . traMADol (ULTRAM) 50 MG tablet Take 1 tablet (50 mg total) by mouth every 6 (six) hours as needed. 30 tablet 0   Current Facility-Administered Medications  Medication Dose Route Frequency Provider Last Rate Last Dose  . 0.9 %  sodium chloride infusion  500 mL Intravenous Continuous Irene Shipper, MD        REVIEW OF SYSTEMS:  Constitutional: Denies fevers, chills or abnormal night sweats, Eyes: Denies blurriness of vision, double vision or watery eyes Ears, nose, mouth, throat, and face: Denies mucositis or sore throat Respiratory: Denies cough, dyspnea or wheezes Cardiovascular: Denies palpitation, chest discomfort or lower extremity swelling Gastrointestinal:  Denies nausea, heartburn, (+) incontinence, hematochezia, irregular bowel activity (+) rectal bleeding Skin: Denies abnormal skin rashes Lymphatics: Denies new  lymphadenopathy or easy bruising Neurological:Denies numbness, tingling or new weaknesses Behavioral/Psych: Mood is stable, no new changes  All other systems were reviewed with the patient and are negative.  PHYSICAL EXAMINATION:  ECOG PERFORMANCE STATUS: 1 - Symptomatic but completely ambulatory Vitals:   08/30/16 1107  BP: 132/61  Pulse: 67  Resp: 18  Temp: 98.5 F (36.9 C)  TempSrc: Oral  SpO2: 100%  Weight: 143 lb (64.9 kg)  Height: 5' (1.524 m)    GENERAL:alert, no distress and comfortable SKIN: skin color, texture, turgor are normal, no rashes or significant lesions, Mild erythema parenal area, no skin breakdown or discharge  EYES: normal, conjunctiva are pink and non-injected, sclera clear OROPHARYNX:no exudate, no erythema and lips, buccal mucosa, and tongue normal NECK:  supple, thyroid normal size, non-tender, without nodularity LYMPH:  no palpable lymphadenopathy in the cervical, axillary or inguinal LUNGS: clear to auscultation and percussion with normal breathing effort HEART: regular rate & rhythm and no murmurs and no lower extremity edema ABDOMEN:abdomen soft, non-tender, and normal bowel sounds. No organomegaly. Musculoskeletal:no cyanosis of digits and no clubbing PSYCH: alert & oriented x 3 with fluent speech NEURO: no focal motor/sensory deficits Rectal Exam: Deferred today   LABORATORY DATA:  I have reviewed the data as listed. CBC Latest Ref Rng & Units 08/30/2016 08/16/2016 08/04/2016  WBC 3.9 - 10.3 10e3/uL 6.9 6.4 7.7  Hemoglobin 11.6 - 15.9 g/dL 11.2(L) 11.1(L) 12.0  Hematocrit 34.8 - 46.6 % 34.1(L) 34.2(L) 37.7  Platelets 145 - 400 10e3/uL 193 237 246   CMP Latest Ref Rng & Units 08/30/2016 08/16/2016 08/04/2016  Glucose 70 - 140 mg/dl 84 103 83  BUN 7.0 - 26.0 mg/dL 16.6 18.5 20.9  Creatinine 0.6 - 1.1 mg/dL 0.7 0.6 0.6  Sodium 136 - 145 mEq/L 141 142 143  Potassium 3.5 - 5.1 mEq/L 3.1(L) 3.9 3.6  Chloride 96 - 112 mEq/L - - -  CO2 22 - 29 mEq/L 24  27 22   Calcium 8.4 - 10.4 mg/dL 8.9 8.8 9.3  Total Protein 6.4 - 8.3 g/dL 5.7(L) 5.4(L) 6.0(L)  Total Bilirubin 0.20 - 1.20 mg/dL 0.28 0.28 0.28  Alkaline Phos 40 - 150 U/L 70 72 82  AST 5 - 34 U/L 21 23 21   ALT 0 - 55 U/L 23 31 28     PATHOLOGY: 07/09/2016: Diagnosis Surgical [P], rectum mass - INVASIVE ADENOCARCINOMA.  RADIOGRAPHIC STUDIES: I have personally reviewed the radiological images as listed and agreed with the findings in the report. Mr Pelvis W Wo Contrast  Result Date: 08/03/2016 CLINICAL DATA:  Rectal cancer.  Staging. EXAM: MRI ABDOMEN AND PELVIS WITHOUT AND WITH CONTRAST TECHNIQUE: Multiplanar multisequence MR imaging of the abdomen and pelvis was performed both before and after the administration of intravenous contrast. CONTRAST:  65mL MULTIHANCE GADOBENATE DIMEGLUMINE 529 MG/ML IV SOLN COMPARISON:  07/12/2016. FINDINGS: COMBINED FINDINGS FOR BOTH MR ABDOMEN AND PELVIS Lower chest: No acute findings. Hepatobiliary: Within the anterior dome of liver there is a small arterial phase enhancing structure measuring 8 mm, image 11 of series 11901. This becomes isointense to liver on the portal venous and delayed phase images. There is a focal area of signal dropout within segment 4 B which corresponds to the hypodense lesion on previous exam from 07/12/2016 compatible with focal fatty deposition. Previous cholecystectomy. Mild increase caliber of the CBD which measures 7 mm. No obstructing stone or mass noted. Pancreas: No mass, inflammatory changes, or other parenchymal abnormality identified. Spleen:  Within normal limits in size and appearance. Adrenals/Urinary Tract: Small bilateral adrenal nodules show loss of signal on the out of phase sequences compatible with benign adenomas. No kidney mass identified. Urinary bladder is unremarkable. Stomach/Bowel: The stomach is normal. No pathologic dilatation of the large or small bowel loops. Rectal mass is again noted. This measures 5.3 x 4.1  x 6.1 cm. This extends to the level of the an anorectal angle compatible with a low rectal cancer. The lesion extends beyond the muscularis propria by at least 2.4 cm. No surrounding lymph nodes identified. Vascular/Lymphatic: No pathologically enlarged lymph nodes identified. No abdominal aortic aneurysm demonstrated. Reproductive: Uterus and bilateral adnexa are unremarkable. Other:  None. Musculoskeletal: No suspicious bone lesions identified. IMPRESSION: 1. Large rectal mass measures 6.1 cm in length. No  obstruction identified. This is compatible with at least a T3bN0M0 lesion. 2. No specific features highly suspicious for nodal metastasis or metastatic disease to the upper abdomen. 3. Indeterminate arterial phase enhancing lesion within the anterior dome of liver measures less than 1 cm. This may represent a benign liver lesions such as FNH or adenoma. Less favored with the hypervascular liver metastasis. Followup imaging at 6 months with repeat MRI of the liver is advise. 4. Bilateral adrenal adenomas. Electronically Signed   By: Kerby Moors M.D.   On: 08/03/2016 10:05   Mr Abdomen W Wo Contrast  Result Date: 08/03/2016 CLINICAL DATA:  Rectal cancer.  Staging. EXAM: MRI ABDOMEN AND PELVIS WITHOUT AND WITH CONTRAST TECHNIQUE: Multiplanar multisequence MR imaging of the abdomen and pelvis was performed both before and after the administration of intravenous contrast. CONTRAST:  79mL MULTIHANCE GADOBENATE DIMEGLUMINE 529 MG/ML IV SOLN COMPARISON:  07/12/2016. FINDINGS: COMBINED FINDINGS FOR BOTH MR ABDOMEN AND PELVIS Lower chest: No acute findings. Hepatobiliary: Within the anterior dome of liver there is a small arterial phase enhancing structure measuring 8 mm, image 11 of series 11901. This becomes isointense to liver on the portal venous and delayed phase images. There is a focal area of signal dropout within segment 4 B which corresponds to the hypodense lesion on previous exam from 07/12/2016  compatible with focal fatty deposition. Previous cholecystectomy. Mild increase caliber of the CBD which measures 7 mm. No obstructing stone or mass noted. Pancreas: No mass, inflammatory changes, or other parenchymal abnormality identified. Spleen:  Within normal limits in size and appearance. Adrenals/Urinary Tract: Small bilateral adrenal nodules show loss of signal on the out of phase sequences compatible with benign adenomas. No kidney mass identified. Urinary bladder is unremarkable. Stomach/Bowel: The stomach is normal. No pathologic dilatation of the large or small bowel loops. Rectal mass is again noted. This measures 5.3 x 4.1 x 6.1 cm. This extends to the level of the an anorectal angle compatible with a low rectal cancer. The lesion extends beyond the muscularis propria by at least 2.4 cm. No surrounding lymph nodes identified. Vascular/Lymphatic: No pathologically enlarged lymph nodes identified. No abdominal aortic aneurysm demonstrated. Reproductive: Uterus and bilateral adnexa are unremarkable. Other:  None. Musculoskeletal: No suspicious bone lesions identified. IMPRESSION: 1. Large rectal mass measures 6.1 cm in length. No obstruction identified. This is compatible with at least a T3bN0M0 lesion. 2. No specific features highly suspicious for nodal metastasis or metastatic disease to the upper abdomen. 3. Indeterminate arterial phase enhancing lesion within the anterior dome of liver measures less than 1 cm. This may represent a benign liver lesions such as FNH or adenoma. Less favored with the hypervascular liver metastasis. Followup imaging at 6 months with repeat MRI of the liver is advise. 4. Bilateral adrenal adenomas. Electronically Signed   By: Kerby Moors M.D.   On: 08/03/2016 10:05    ASSESSMENT & PLAN:  Derya Dettmann is a 66 y.o. woman with past medical history of gastric bypass surgery, presented with rectal bleeding, loose bowel movement and stool incontinence for one half  years, was recently diagnosed invasive rectal adenocarcinoma.  1. Invasive low rectal adenocarcinoma, cT3N0M0 -I reviewed his CT scan findings, and surgical pathology results in great details with patient and her friend.  -I discussed her abdominal and pelvic MRI scan findings, which showed a T3 N0 rectal tumor. The liver lesion is indeterminate, does not have possibility of metastasis. We'll continue observation, and repeat scan in 3-6 months. -Given  her T3 disease, I recommend neoadjuvant chemotherapy and irradiation, followed by surgery and adjuvant chemotherapy. -We discussed the role of neoadjuvant chemotherapy for colorectal cancer, to shrink the tumor and reduce the risk of cancer recurrence after surgery -She has seen colorectal surgeon Dr. Marcello Moores, surgical option of APR vs low anterior resection were discussed, Dr. Marcello Moores feels she needs permanent colostomy. Due to her prior gastric bypass surgery, however patient is very reluctant to have surgery due to the need of colostomy bag. -Patient agrees to proceed with concurrent chemotherapy and radiation.  -Patient is tolerating concurrent chemotherapy and radiation very well, lab reviewed, adequate for treatment, we'll continue, final treatment 9/14 -She has developed some rectal pain from radiation, I given a prescription of tramadol as needed for pain control -She has seen colorectal surgeon Dr. Drue Flirt at [facility] and plan to switch  -I'll see her back in 2 weeks   2. Iron Deficient Anemia - The patient takes 2 iron supplements daily. - Her iron has been somewhat low. - She will stop taking aspirin to reduce her bleeding risk. -Repeat iron studies showed iron deficiency, she will receive 1 dose IV iron Feraheme today. -Her levels are improved now, Ferritin is 272 as of 08/16/2016  3. Genetics -Patient has 2 sisters who had uterine cancer, this is suspicious for lynch syndrome. - I encouraged the patient to pursue genetic testing based  on her family history. This will determine if the patient carries deleterious genes or is at risk for lynch syndrome. - The patient is in agreement and I will refer her to these services.    4. History of gastric bypass surgery in 2003 -for obesity   5. History of DM and HTN -Resolved after her gastric bypass surgery. -We'll monitor her blood pressure and glucose closely during her chemotherapy treatment.  6. Liver lesion -Indeterminate, no high suspicion for metastatic disease, plan to repeat liver MRI in 3-6 months.  7. Hypokalemia - Low and will recheck next week. - I advised her to eat foods that are high in potassium -I called in potassium chloride 20 meq daily  PLAN: -Continue concurrent chemotherapy and radiation with Xeloda  -lab weekly  -I'll see her back in 2 weeks -I refilled her xeloda #50 today  -I also given a prescription of tramadol as needed if her rectal pain gets worse  No orders of the defined types were placed in this encounter.  All questions were answered. The patient knows to call the clinic with any problems, questions or concerns.  I spent 20 minutes counseling the patient face to face. The total time spent in the appointment was 25 minutes and more than 50% was on counseling.  This document serves as a record of services personally performed by Truitt Merle, MD. It was created on her behalf by Margit Banda, a trained medical scribe. The creation of this record is based on the scribe's personal observations and the provider's statements to them. This document has been checked and approved by the attending provider.     Truitt Merle, MD 08/30/2016

## 2016-08-31 ENCOUNTER — Telehealth: Payer: Self-pay | Admitting: Hematology

## 2016-08-31 ENCOUNTER — Ambulatory Visit
Admission: RE | Admit: 2016-08-31 | Discharge: 2016-08-31 | Disposition: A | Discharge status: Home / Self Care | Payer: Medicare HMO | Source: Ambulatory Visit | Attending: Radiation Oncology | Admitting: Radiation Oncology

## 2016-08-31 DIAGNOSIS — Z833 Family history of diabetes mellitus: Secondary | ICD-10-CM | POA: Diagnosis not present

## 2016-08-31 DIAGNOSIS — Z510 Encounter for antineoplastic radiation therapy: Secondary | ICD-10-CM | POA: Diagnosis not present

## 2016-08-31 DIAGNOSIS — D500 Iron deficiency anemia secondary to blood loss (chronic): Secondary | ICD-10-CM | POA: Diagnosis not present

## 2016-08-31 DIAGNOSIS — C20 Malignant neoplasm of rectum: Secondary | ICD-10-CM | POA: Diagnosis not present

## 2016-08-31 DIAGNOSIS — R69 Illness, unspecified: Secondary | ICD-10-CM | POA: Diagnosis not present

## 2016-08-31 DIAGNOSIS — Z808 Family history of malignant neoplasm of other organs or systems: Secondary | ICD-10-CM | POA: Diagnosis not present

## 2016-08-31 MED FILL — XELODA 500 MG TABLET: 500 | 8 days supply | Qty: 50 | Fill #0

## 2016-08-31 NOTE — Telephone Encounter (Signed)
Per 8/27 no los °

## 2016-09-01 ENCOUNTER — Ambulatory Visit
Admission: RE | Admit: 2016-09-01 | Discharge: 2016-09-01 | Disposition: A | Discharge status: Home / Self Care | Payer: Medicare HMO | Source: Ambulatory Visit | Attending: Radiation Oncology | Admitting: Radiation Oncology

## 2016-09-01 DIAGNOSIS — Z510 Encounter for antineoplastic radiation therapy: Secondary | ICD-10-CM | POA: Diagnosis not present

## 2016-09-01 DIAGNOSIS — Z833 Family history of diabetes mellitus: Secondary | ICD-10-CM | POA: Diagnosis not present

## 2016-09-01 DIAGNOSIS — D500 Iron deficiency anemia secondary to blood loss (chronic): Secondary | ICD-10-CM | POA: Diagnosis not present

## 2016-09-01 DIAGNOSIS — Z808 Family history of malignant neoplasm of other organs or systems: Secondary | ICD-10-CM | POA: Diagnosis not present

## 2016-09-01 DIAGNOSIS — C20 Malignant neoplasm of rectum: Secondary | ICD-10-CM | POA: Diagnosis not present

## 2016-09-02 ENCOUNTER — Ambulatory Visit
Admission: RE | Admit: 2016-09-02 | Discharge: 2016-09-02 | Disposition: A | Discharge status: Home / Self Care | Payer: Medicare HMO | Source: Ambulatory Visit | Attending: Radiation Oncology | Admitting: Radiation Oncology

## 2016-09-02 DIAGNOSIS — C20 Malignant neoplasm of rectum: Secondary | ICD-10-CM | POA: Diagnosis not present

## 2016-09-02 DIAGNOSIS — Z833 Family history of diabetes mellitus: Secondary | ICD-10-CM | POA: Diagnosis not present

## 2016-09-02 DIAGNOSIS — Z510 Encounter for antineoplastic radiation therapy: Secondary | ICD-10-CM | POA: Diagnosis not present

## 2016-09-02 DIAGNOSIS — D500 Iron deficiency anemia secondary to blood loss (chronic): Secondary | ICD-10-CM | POA: Diagnosis not present

## 2016-09-02 DIAGNOSIS — Z808 Family history of malignant neoplasm of other organs or systems: Secondary | ICD-10-CM | POA: Diagnosis not present

## 2016-09-03 ENCOUNTER — Ambulatory Visit
Admission: RE | Admit: 2016-09-03 | Discharge: 2016-09-03 | Disposition: A | Discharge status: Home / Self Care | Payer: Medicare HMO | Source: Ambulatory Visit | Attending: Radiation Oncology | Admitting: Radiation Oncology

## 2016-09-03 DIAGNOSIS — Z808 Family history of malignant neoplasm of other organs or systems: Secondary | ICD-10-CM | POA: Diagnosis not present

## 2016-09-03 DIAGNOSIS — Z510 Encounter for antineoplastic radiation therapy: Secondary | ICD-10-CM | POA: Diagnosis not present

## 2016-09-03 DIAGNOSIS — Z833 Family history of diabetes mellitus: Secondary | ICD-10-CM | POA: Diagnosis not present

## 2016-09-03 DIAGNOSIS — D500 Iron deficiency anemia secondary to blood loss (chronic): Secondary | ICD-10-CM | POA: Diagnosis not present

## 2016-09-03 DIAGNOSIS — C20 Malignant neoplasm of rectum: Secondary | ICD-10-CM | POA: Diagnosis not present

## 2016-09-07 ENCOUNTER — Other Ambulatory Visit (HOSPITAL_BASED_OUTPATIENT_CLINIC_OR_DEPARTMENT_OTHER): Payer: Medicare HMO

## 2016-09-07 ENCOUNTER — Other Ambulatory Visit: Payer: Self-pay | Admitting: *Deleted

## 2016-09-07 ENCOUNTER — Ambulatory Visit
Admission: RE | Admit: 2016-09-07 | Discharge: 2016-09-07 | Disposition: A | Discharge status: Home / Self Care | Payer: Medicare HMO | Source: Ambulatory Visit | Attending: Radiation Oncology | Admitting: Radiation Oncology

## 2016-09-07 DIAGNOSIS — C20 Malignant neoplasm of rectum: Secondary | ICD-10-CM | POA: Diagnosis not present

## 2016-09-07 DIAGNOSIS — D500 Iron deficiency anemia secondary to blood loss (chronic): Secondary | ICD-10-CM | POA: Diagnosis not present

## 2016-09-07 DIAGNOSIS — Z510 Encounter for antineoplastic radiation therapy: Secondary | ICD-10-CM | POA: Diagnosis not present

## 2016-09-07 DIAGNOSIS — Z833 Family history of diabetes mellitus: Secondary | ICD-10-CM | POA: Diagnosis not present

## 2016-09-07 DIAGNOSIS — Z808 Family history of malignant neoplasm of other organs or systems: Secondary | ICD-10-CM | POA: Diagnosis not present

## 2016-09-07 LAB — CBC & DIFF AND RETIC
BASO%: 0.1 % (ref 0.0–2.0)
BASOS ABS: 0 10*3/uL (ref 0.0–0.1)
EOS ABS: 0.9 10*3/uL — AB (ref 0.0–0.5)
EOS%: 12.1 % — ABNORMAL HIGH (ref 0.0–7.0)
HEMATOCRIT: 34.2 % — AB (ref 34.8–46.6)
HEMOGLOBIN: 11.3 g/dL — AB (ref 11.6–15.9)
IMMATURE RETIC FRACT: 15.1 % — AB (ref 1.60–10.00)
LYMPH%: 5.2 % — AB (ref 14.0–49.7)
MCH: 30.7 pg (ref 25.1–34.0)
MCHC: 33 g/dL (ref 31.5–36.0)
MCV: 92.9 fL (ref 79.5–101.0)
MONO#: 1 10*3/uL — AB (ref 0.1–0.9)
MONO%: 13 % (ref 0.0–14.0)
NEUT%: 69.6 % (ref 38.4–76.8)
NEUTROS ABS: 5.2 10*3/uL (ref 1.5–6.5)
PLATELETS: 197 10*3/uL (ref 145–400)
RBC: 3.68 10*6/uL — ABNORMAL LOW (ref 3.70–5.45)
RDW: 15.8 % — ABNORMAL HIGH (ref 11.2–14.5)
Retic %: 1.49 % (ref 0.70–2.10)
Retic Ct Abs: 54.83 10*3/uL (ref 33.70–90.70)
WBC: 7.5 10*3/uL (ref 3.9–10.3)
lymph#: 0.4 10*3/uL — ABNORMAL LOW (ref 0.9–3.3)

## 2016-09-07 LAB — COMPREHENSIVE METABOLIC PANEL
ALT: 19 U/L (ref 0–55)
ANION GAP: 6 meq/L (ref 3–11)
AST: 15 U/L (ref 5–34)
Albumin: 2.7 g/dL — ABNORMAL LOW (ref 3.5–5.0)
Alkaline Phosphatase: 67 U/L (ref 40–150)
BUN: 17.6 mg/dL (ref 7.0–26.0)
CHLORIDE: 108 meq/L (ref 98–109)
CO2: 27 meq/L (ref 22–29)
CREATININE: 0.7 mg/dL (ref 0.6–1.1)
Calcium: 8.8 mg/dL (ref 8.4–10.4)
EGFR: >90 mL/min/{1.73_m2} (ref >90)
Glucose: 92 mg/dl (ref 70–140)
Potassium: 2.9 mEq/L — CL (ref 3.5–5.1)
SODIUM: 141 meq/L (ref 136–145)
Total Bilirubin: 0.29 mg/dL (ref 0.20–1.20)
Total Protein: 5.4 g/dL — ABNORMAL LOW (ref 6.4–8.3)

## 2016-09-07 LAB — IRON AND TIBC
%SAT: 38 % (ref 21–57)
Iron: 89 ug/dL (ref 41–142)
TIBC: 238 ug/dL (ref 236–444)
UIBC: 148 ug/dL (ref 120–384)

## 2016-09-07 LAB — FERRITIN: FERRITIN: 176 ng/mL (ref 9–269)

## 2016-09-07 MED ORDER — POTASSIUM CHLORIDE CRYS ER 20 MEQ PO TBCR: EXTENDED_RELEASE_TABLET | ORAL | 1 refills | Status: DC

## 2016-09-08 ENCOUNTER — Ambulatory Visit
Admission: RE | Admit: 2016-09-08 | Discharge: 2016-09-08 | Disposition: A | Discharge status: Home / Self Care | Payer: Medicare HMO | Source: Ambulatory Visit | Attending: Radiation Oncology | Admitting: Radiation Oncology

## 2016-09-08 DIAGNOSIS — C20 Malignant neoplasm of rectum: Secondary | ICD-10-CM | POA: Diagnosis not present

## 2016-09-08 DIAGNOSIS — D500 Iron deficiency anemia secondary to blood loss (chronic): Secondary | ICD-10-CM | POA: Diagnosis not present

## 2016-09-08 DIAGNOSIS — Z833 Family history of diabetes mellitus: Secondary | ICD-10-CM | POA: Diagnosis not present

## 2016-09-08 DIAGNOSIS — Z510 Encounter for antineoplastic radiation therapy: Secondary | ICD-10-CM | POA: Diagnosis not present

## 2016-09-08 DIAGNOSIS — Z808 Family history of malignant neoplasm of other organs or systems: Secondary | ICD-10-CM | POA: Diagnosis not present

## 2016-09-09 ENCOUNTER — Telehealth: Payer: Self-pay | Admitting: Hematology

## 2016-09-09 ENCOUNTER — Ambulatory Visit
Admission: RE | Admit: 2016-09-09 | Discharge: 2016-09-09 | Disposition: A | Discharge status: Home / Self Care | Payer: Medicare HMO | Source: Ambulatory Visit | Attending: Radiation Oncology | Admitting: Radiation Oncology

## 2016-09-09 DIAGNOSIS — Z510 Encounter for antineoplastic radiation therapy: Secondary | ICD-10-CM | POA: Diagnosis not present

## 2016-09-09 DIAGNOSIS — D500 Iron deficiency anemia secondary to blood loss (chronic): Secondary | ICD-10-CM | POA: Diagnosis not present

## 2016-09-09 DIAGNOSIS — Z833 Family history of diabetes mellitus: Secondary | ICD-10-CM | POA: Diagnosis not present

## 2016-09-09 DIAGNOSIS — C20 Malignant neoplasm of rectum: Secondary | ICD-10-CM | POA: Diagnosis not present

## 2016-09-09 DIAGNOSIS — Z808 Family history of malignant neoplasm of other organs or systems: Secondary | ICD-10-CM | POA: Diagnosis not present

## 2016-09-09 NOTE — Progress Notes (Signed)
[name]  [phone]) [phone] [phone]  Clinic Follow Up Note   Patient Care Team: Lucille Passy, MD as PCP - General (Family Medicine) Irene Shipper, MD as Consulting Physician (Gastroenterology) Leighton Ruff, MD as Consulting Physician (General Surgery) Truitt Merle, MD as Consulting Physician (Hematology) Kyung Rudd, MD as Consulting Physician (Radiation Oncology) 09/13/2016  CHIEF COMPLAINTS:  Follow up rectal Adenocarcinoma  Oncology History   Cancer Staging Rectal adenocarcinoma [facility]) Staging form: Colon and Rectum, AJCC 8th Edition - Clinical stage from 07/09/2016: Stage IIA (cT3, cN0, cM0) - Signed by Truitt Merle, MD on 08/16/2016       Rectal adenocarcinoma ([place])   07/09/2016 Pathology Results    Diagnosis Surgical [P], rectum mass - INVASIVE ADENOCARCINOMA. - SEE COMMENT.      07/09/2016 Procedure    Colonoscopy A non-obstructing large friable mass was found in the distal rectum. This began at approximately 1 cm above the anal verge and extended proximal for a distance of 8-10 cm. The lesion was bulky and involved approximately 75% of the luminal circumference. Multiple biopsies were taken. A few small-mouthed diverticula were found in the left colon. The exam was otherwise without abnormality on direct views. Retroflexion not performed purposely.      07/12/2016 Imaging    CT C/A/P with contrast IMPRESSION: Rectal wall thickening/mass, compatible with known rectal adenocarcinoma. No findings specific for metastatic disease in the chest, abdomen, or pelvis. 2.2 cm probable hemangioma in segment 4B, although poorly evaluated. Consider MRI abdomen with/ without multihance contrast for definitive characterization, as clinically warranted. Bilateral adrenal nodules measuring up to 1.6 cm on the right, likely reflecting benign adrenal adenomas, although technically indeterminate. If MRI abdomen is performed, these can be definitively  characterized at that time.      07/20/2016 Initial Diagnosis    Rectal adenocarcinoma ([place])     08/03/2016 Imaging    MRI AP W WO Contrast 08/03/16 IMPRESSION: 1. Large rectal mass measures 6.1 cm in length. No obstruction identified. This is compatible with at least a T3bN0M0 lesion. 2. No specific features highly suspicious for nodal metastasis or metastatic disease to the upper abdomen. 3. Indeterminate arterial phase enhancing lesion within the anterior dome of liver measures less than 1 cm. This may represent a benign liver lesions such as FNH or adenoma. Less favored with the hypervascular liver metastasis. Followup imaging at 6 months with repeat MRI of the liver is advise. 4. Bilateral adrenal adenomas      08/04/2016 -  Radiation Therapy    Concurrent chemo radiation with Dr. Lisbeth [name], ending 09/17/16      08/04/2016 -  Chemotherapy    Concurrent chemo radiation with Xeloda, 1500mg  twice daily       HISTORY OF PRESENTING ILLNESS: 07/20/16 Tonya [name] 66 y.o. female is here because of recently discovered invasive adenocarcinoma. She is accompanied by a friend today. The patient underwent colonoscopy on 07/09/16 with Dr. [name]. According to Dr. Blanch Media note from that encounter, the patient presented with complaints of rectal bleeding and iron deficiency anemia. She reports she was told she has hemorrhoids and initially was not concerned about the rectal bleeding. Her most recent prior colonoscopy was 2005. On exam, Dr. Henrene Pastor noted a non-obstructing large friable mass was found in the distal rectum. This began at approximately 1 cm above the anal verge and extended proximal for a distance of 8-10 cm. The lesion was bulky and involved approximately 75% of the luminal circumference. Biopsy of  the rectal mass on 07/09/16 showed invasive adenocarcinoma.  CT C/A/P on 07/12/16 revealed rectal wall thickening/mass, compatible with known rectal adenocarcinoma.No findings specific for  metastatic disease in the chest, abdomen, or pelvis. 2.2 cm probable hemangioma in segment 4B, although poorly evaluated. Consider MRI abdomen with/ without multihance contrast for definitive characterization, as clinically warranted. Bilateral adrenal nodules measuring up to 1.6 cm on the right, likely reflecting benign adrenal adenomas, although technically indeterminate. If MRI abdomen is performed, these can be definitively characterized at that time.  On 07/19/16 the patient consulted with Dr. Leighton Ruff of [facility]. Per her note, she recommends complete metastatic workup with MRI to evaluate liver, adrenal gland, and rectum. The patient will follow up with Dr. Marcello Moores following neoadjuvant therapy to discuss surgical resection.  The patient reports she has lost 30 lbs since January. She reports ongoing fatigue, which her PCP related to iron deficiency and vitamin D deficiency. She reports occasional incontinence, and irregular bowel activity. She reports ongoing bright red rectal bleeding. She denies cramps or pain. She takes 2 iron pills daily.  CURRENT THERAPY: Concurrent chemoradiation with Xeloda, 1500mg  twice daily Mon-Fri started 08/04/16, ending 09/17/16  INTERVAL HISTORY:  Tonya [name] is here for a follow-up. She returns to the clinic today reporting she wants to finish chemo this week. It is getting harder for her. She has fatigue and has been slowly eating weight. She has no energy to eat or appetite. Her pain is currently controlled. She takes tramadol (2-3 daily) and ibuprofen . She thinks it is working. On medication her pain is 4/10 and will go back to 8-9/10. She has Bm every 2 hours a day.  From radiation she does have skin breakdown. She will see Dr. Drue Flirt in September.    MEDICAL HISTORY:  Past Medical History:  Diagnosis Date  . Coronary artery disease 07/2003  . Myocardial infarction ([place])   . Obesity 2003   s/p gastric bypass     SURGICAL  HISTORY: Past Surgical History:  Procedure Laterality Date  . CHOLECYSTECTOMY  1999  . GASTRIC BYPASS  2003    SOCIAL HISTORY: Social History   Social History  . Marital status: Single    Spouse name: N/A  . Number of children: 0  . Years of education: N/A   Occupational History  . retired    Social History Main Topics  . Smoking status: Never Smoker  . Smokeless tobacco: Never Used  . Alcohol use No  . Drug use: No  . Sexual activity: Not on file   Other Topics Concern  . Not on file   Social History Narrative  . No narrative on file    FAMILY HISTORY: Family History  Problem Relation Age of Onset  . Heart attack Mother        d.93  . Throat cancer Mother 37  . Clotting disorder Mother   . Liver disease Mother   . Kidney disease Mother   . Heart attack Father        d.92  . Prostate cancer Father 36  . Uterine cancer Sister 81       treated with total hysterectomy and radiation  . Thyroid cancer Sister 37       treated with thyroidectomy  . Cancer Maternal Uncle        d.80s unspecified type of cancer. History of smoking.  Marland Kitchen Uterine cancer Sister 101  . Colon cancer Cousin 44  paternal first-cousin    ALLERGIES:  is allergic to bactrim [sulfamethoxazole-trimethoprim]; phenergan [promethazine hcl]; sulfa antibiotics; and penicillins.  MEDICATIONS:  Current Outpatient Prescriptions  Medication Sig Dispense Refill  . capecitabine (XELODA) 500 MG tablet Take 3 tablets (1,500 mg total) by mouth 2 (two) times daily after a meal. Take on days of radiation only 50 tablet 0  . cholecalciferol (VITAMIN D) 1000 UNITS tablet Take 1,000 Units by mouth daily.      Marland Kitchen dicyclomine (BENTYL) 10 MG capsule Take 1 tab twice daily as needed for cramps. 30 capsule 1  . ferrous sulfate (FERROUSUL) 325 (65 FE) MG tablet Take 1 tablet (325 mg total) by mouth daily with breakfast. (Patient taking differently: Take 325 mg by mouth. Take two by mouth daily) 30 tablet 6  .  Ibuprofen-Diphenhydramine Cit (ADVIL PM) 200-38 MG TABS Take 1 tablet by mouth every 5 (five) hours as needed.     . Multiple Vitamin (MULTIVITAMIN) tablet Take 1 tablet by mouth daily.    . ondansetron (ZOFRAN) 8 MG tablet Take 1 tablet (8 mg total) by mouth every 8 (eight) hours as needed for nausea or vomiting. 20 tablet 1  . potassium chloride SA (K-DUR,KLOR-CON) 20 MEQ tablet Take one tab three times daily x 2 days, then twice daily x 3 days then daily. 40 tablet 1  . prochlorperazine (COMPAZINE) 10 MG tablet Take 1 tablet (10 mg total) by mouth every 6 (six) hours as needed for nausea or vomiting. 30 tablet 1  . traMADol (ULTRAM) 50 MG tablet Take 1 tablet (50 mg total) by mouth every 6 (six) hours as needed. 90 tablet 0   Current Facility-Administered Medications  Medication Dose Route Frequency Provider Last Rate Last Dose  . 0.9 %  sodium chloride infusion  500 mL Intravenous Continuous Irene Shipper, MD        REVIEW OF SYSTEMS:  Constitutional: Denies fevers, chills or abnormal night sweats, Eyes: Denies blurriness of vision, double vision or watery eyes Ears, nose, mouth, throat, and face: Denies mucositis or sore throat Respiratory: Denies cough, dyspnea or wheezes Cardiovascular: Denies palpitation, chest discomfort or lower extremity swelling Gastrointestinal:  Denies nausea, heartburn, (+) frequent bowel movement  Skin: Denies abnormal skin rashes (+) skin breakdown from radiation Lymphatics: Denies new lymphadenopathy or easy bruising Neurological:Denies numbness, tingling or new weaknesses Behavioral/Psych: Mood is stable, no new changes  All other systems were reviewed with the patient and are negative.  PHYSICAL EXAMINATION:  ECOG PERFORMANCE STATUS: 1 - Symptomatic but completely ambulatory Vitals:   09/13/16 1109  BP: 121/72  Pulse: 64  Resp: 18  Temp: 98.1 F (36.7 C)  TempSrc: Oral  SpO2: 100%  Weight: 139 lb 9.6 oz (63.3 kg)  Height: 5' (1.524 m)     GENERAL:alert, no distress and comfortable SKIN: skin color, texture, turgor are normal, no rashes or significant lesions, Mild erythema parenal area, no skin breakdown or discharge  EYES: normal, conjunctiva are pink and non-injected, sclera clear OROPHARYNX:no exudate, no erythema and lips, buccal mucosa, and tongue normal NECK: supple, thyroid normal size, non-tender, without nodularity LYMPH:  no palpable lymphadenopathy in the cervical, axillary or inguinal LUNGS: clear to auscultation and percussion with normal breathing effort HEART: regular rate & rhythm and no murmurs and no lower extremity edema ABDOMEN:abdomen soft, non-tender, and normal bowel sounds. No organomegaly. Musculoskeletal:no cyanosis of digits and no clubbing PSYCH: alert & oriented x 3 with fluent speech NEURO: no focal motor/sensory deficits Rectal Exam: Deferred today  LABORATORY DATA:  I have reviewed the data as listed. CBC Latest Ref Rng & Units 09/13/2016 09/07/2016 08/30/2016  WBC 3.9 - 10.3 10e3/uL 7.2 7.5 6.9  Hemoglobin 11.6 - 15.9 g/dL 11.7 11.3(L) 11.2(L)  Hematocrit 34.8 - 46.6 % 35.9 34.2(L) 34.1(L)  Platelets 145 - 400 10e3/uL 258 197 193   CMP Latest Ref Rng & Units 09/13/2016 09/07/2016 08/30/2016  Glucose 70 - 140 mg/dl 96 92 84  BUN 7.0 - 26.0 mg/dL 15.7 17.6 16.6  Creatinine 0.6 - 1.1 mg/dL 0.6 0.7 0.7  Sodium 136 - 145 mEq/L 139 141 141  Potassium 3.5 - 5.1 mEq/L 4.0 2.9(LL) 3.1(L)  Chloride 96 - 112 mEq/L - - -  CO2 22 - 29 mEq/L 23 27 24   Calcium 8.4 - 10.4 mg/dL 9.3 8.8 8.9  Total Protein 6.4 - 8.3 g/dL 6.0(L) 5.4(L) 5.7(L)  Total Bilirubin 0.20 - 1.20 mg/dL 0.36 0.29 0.28  Alkaline Phos 40 - 150 U/L 78 67 70  AST 5 - 34 U/L 16 15 21   ALT 0 - 55 U/L 14 19 23     PATHOLOGY: 07/09/2016: Diagnosis Surgical [P], rectum mass - INVASIVE ADENOCARCINOMA.  RADIOGRAPHIC STUDIES: I have personally reviewed the radiological images as listed and agreed with the findings in the report. No  results found.  ASSESSMENT & PLAN:  Tonya [name] is a 66 y.o. woman with past medical history of gastric bypass surgery, presented with rectal bleeding, loose bowel movement and stool incontinence for one half years, was recently diagnosed invasive rectal adenocarcinoma.  1. Invasive low rectal adenocarcinoma, cT3N0M0 -I reviewed his CT scan findings, and surgical pathology results in great details with patient and her friend.  -I discussed her abdominal and pelvic MRI scan findings, which showed a T3 N0 rectal tumor. The liver lesion is indeterminate, does not have possibility of metastasis. We'll continue observation, and repeat scan in 3-6 months. -Given her T3 disease, I recommend neoadjuvant chemotherapy and irradiation, followed by surgery and adjuvant chemotherapy. -We discussed the role of neoadjuvant chemotherapy for colorectal cancer, to shrink the tumor and reduce the risk of cancer recurrence after surgery -She has seen colorectal surgeon Dr. Marcello Moores, surgical option of APR vs low anterior resection were discussed, Dr. Marcello Moores feels she needs permanent colostomy. Due to her prior gastric bypass surgery, however patient is very reluctant to have surgery due to the need of colostomy bag. -Patient agrees to proceed with concurrent chemotherapy and radiation.  -Patient has been tolerating concurrent chemotherapy and radiation very well, final treatment 9/14 -She has developed some rectal pain from radiation, I previously gave a prescription of tramadol as needed for pain control, she will continue  -She has seen colorectal surgeon Dr. Drue Flirt at [facility] and plan to switch. She has an f/u appointment on September 14 to discuss surgery. I strongly encouraged her to consider surgery. -Labs reviewed and potassium is 4.0 and she is adequate to continue treatment.  -Her pain is mostly controlled I suggest she to contin tramadolue  -I encouraged her eats more, gain weight back and try to be  more active before surgery.  -I'll see her back in 3 weeks    2. Iron Deficient Anemia - The patient takes 2 iron supplements daily. - Her iron has been somewhat low. - She will stop taking aspirin to reduce her bleeding risk. -Repeat iron studies showed iron deficiency, she will receive 1 dose IV iron Feraheme today. -Her levels are improved now, Ferritin is 272 as of 08/16/2016 -Iron  study is normal today (09/13/16)  3. Genetics -Patient has 2 sisters who had uterine cancer, this is suspicious for lynch syndrome. - I encouraged the patient to pursue genetic testing based on her family history. This will determine if the patient has inheritable genetic syndrome, such as Lynch syndrome - The patient is in agreement and I referred her to  genetics  4. History of gastric bypass surgery in 2003 -for obesity   5. History of DM and HTN -Resolved after her gastric bypass surgery. -We'll monitor her blood pressure and glucose closely during her chemotherapy treatment.  6. Liver lesion -Indeterminate, no high suspicion for metastatic disease, plan to repeat liver MRI in 3-6 months.  7. Hypokalemia - Low and will recheck next week. - I advised her to eat foods that are high in potassium -I previously prescribed potassium chloride 20 meq daily -her potassium is 4.0 today. She will continue with 1 potassium a day until next lab   PLAN: -Refill Tramadol -Continue concurrent chemotherapy and radiation with Xeloda,  she will complete on September 14  -She has a follow-up appointment with her surgeon in 2 weeks -I'll see her back in 3 weeks with labs    No orders of the defined types were placed in this encounter.  All questions were answered. The patient knows to call the clinic with any problems, questions or concerns.  I spent 20 minutes counseling the patient face to face. The total time spent in the appointment was 25 minutes and more than 50% was on counseling.  This document serves  as a record of services personally performed by Truitt Merle, MD. It was created on her behalf by Joslyn Devon, a trained medical scribe. The creation of this record is based on the scribe's personal observations and the provider's statements to them. This document has been checked and approved by the attending provider.     Truitt Merle, MD 09/13/2016

## 2016-09-09 NOTE — Telephone Encounter (Signed)
[facility] FAXED RECORDS TO [facility]

## 2016-09-10 ENCOUNTER — Ambulatory Visit
Admission: RE | Admit: 2016-09-10 | Discharge: 2016-09-10 | Disposition: A | Discharge status: Home / Self Care | Payer: Medicare HMO | Source: Ambulatory Visit | Attending: Radiation Oncology | Admitting: Radiation Oncology

## 2016-09-10 DIAGNOSIS — Z510 Encounter for antineoplastic radiation therapy: Secondary | ICD-10-CM | POA: Diagnosis not present

## 2016-09-10 DIAGNOSIS — Z833 Family history of diabetes mellitus: Secondary | ICD-10-CM | POA: Diagnosis not present

## 2016-09-10 DIAGNOSIS — Z808 Family history of malignant neoplasm of other organs or systems: Secondary | ICD-10-CM | POA: Diagnosis not present

## 2016-09-10 DIAGNOSIS — D500 Iron deficiency anemia secondary to blood loss (chronic): Secondary | ICD-10-CM | POA: Diagnosis not present

## 2016-09-10 DIAGNOSIS — C20 Malignant neoplasm of rectum: Secondary | ICD-10-CM | POA: Diagnosis not present

## 2016-09-13 ENCOUNTER — Ambulatory Visit
Admission: RE | Admit: 2016-09-13 | Discharge: 2016-09-13 | Disposition: A | Discharge status: Home / Self Care | Payer: Medicare HMO | Source: Ambulatory Visit | Attending: Radiation Oncology | Admitting: Radiation Oncology

## 2016-09-13 ENCOUNTER — Ambulatory Visit (HOSPITAL_BASED_OUTPATIENT_CLINIC_OR_DEPARTMENT_OTHER): Payer: Medicare HMO | Admitting: Hematology

## 2016-09-13 ENCOUNTER — Other Ambulatory Visit (HOSPITAL_BASED_OUTPATIENT_CLINIC_OR_DEPARTMENT_OTHER): Payer: Medicare HMO

## 2016-09-13 ENCOUNTER — Telehealth: Payer: Self-pay | Admitting: Hematology

## 2016-09-13 VITALS — BP 121/72 | HR 64 | Temp 98.1°F | Resp 18 | Ht 60.0 in | Wt 139.6 lb

## 2016-09-13 DIAGNOSIS — I5031 Acute diastolic (congestive) heart failure: Secondary | ICD-10-CM

## 2016-09-13 DIAGNOSIS — E876 Hypokalemia: Secondary | ICD-10-CM | POA: Diagnosis not present

## 2016-09-13 DIAGNOSIS — C20 Malignant neoplasm of rectum: Secondary | ICD-10-CM | POA: Diagnosis not present

## 2016-09-13 DIAGNOSIS — K769 Liver disease, unspecified: Secondary | ICD-10-CM | POA: Diagnosis not present

## 2016-09-13 DIAGNOSIS — D500 Iron deficiency anemia secondary to blood loss (chronic): Secondary | ICD-10-CM

## 2016-09-13 DIAGNOSIS — Z510 Encounter for antineoplastic radiation therapy: Secondary | ICD-10-CM | POA: Diagnosis not present

## 2016-09-13 DIAGNOSIS — Z833 Family history of diabetes mellitus: Secondary | ICD-10-CM | POA: Diagnosis not present

## 2016-09-13 DIAGNOSIS — Z808 Family history of malignant neoplasm of other organs or systems: Secondary | ICD-10-CM | POA: Diagnosis not present

## 2016-09-13 LAB — IRON AND TIBC
%SAT: 21 % (ref 21–57)
Iron: 54 ug/dL (ref 41–142)
TIBC: 258 ug/dL (ref 236–444)
UIBC: 204 ug/dL (ref 120–384)

## 2016-09-13 LAB — COMPREHENSIVE METABOLIC PANEL
ALK PHOS: 78 U/L (ref 40–150)
ALT: 14 U/L (ref 0–55)
AST: 16 U/L (ref 5–34)
Albumin: 2.8 g/dL — ABNORMAL LOW (ref 3.5–5.0)
Anion Gap: 8 mEq/L (ref 3–11)
BUN: 15.7 mg/dL (ref 7.0–26.0)
CHLORIDE: 107 meq/L (ref 98–109)
CO2: 23 mEq/L (ref 22–29)
Calcium: 9.3 mg/dL (ref 8.4–10.4)
Creatinine: 0.6 mg/dL (ref 0.6–1.1)
GLUCOSE: 96 mg/dL (ref 70–140)
POTASSIUM: 4 meq/L (ref 3.5–5.1)
SODIUM: 139 meq/L (ref 136–145)
Total Bilirubin: 0.36 mg/dL (ref 0.20–1.20)
Total Protein: 6 g/dL — ABNORMAL LOW (ref 6.4–8.3)

## 2016-09-13 LAB — CBC & DIFF AND RETIC
BASO%: 0.1 % (ref 0.0–2.0)
BASOS ABS: 0 10*3/uL (ref 0.0–0.1)
EOS%: 5 % (ref 0.0–7.0)
Eosinophils Absolute: 0.4 10*3/uL (ref 0.0–0.5)
HEMATOCRIT: 35.9 % (ref 34.8–46.6)
HGB: 11.7 g/dL (ref 11.6–15.9)
IMMATURE RETIC FRACT: 17.3 % — AB (ref 1.60–10.00)
LYMPH%: 8.8 % — ABNORMAL LOW (ref 14.0–49.7)
MCH: 31 pg (ref 25.1–34.0)
MCHC: 32.6 g/dL (ref 31.5–36.0)
MCV: 95 fL (ref 79.5–101.0)
MONO#: 0.4 10*3/uL (ref 0.1–0.9)
MONO%: 5.4 % (ref 0.0–14.0)
NEUT#: 5.8 10*3/uL (ref 1.5–6.5)
NEUT%: 80.7 % — AB (ref 38.4–76.8)
PLATELETS: 258 10*3/uL (ref 145–400)
RBC: 3.78 10*6/uL (ref 3.70–5.45)
RDW: 16.6 % — ABNORMAL HIGH (ref 11.2–14.5)
RETIC CT ABS: 61.24 10*3/uL (ref 33.70–90.70)
Retic %: 1.62 % (ref 0.70–2.10)
WBC: 7.2 10*3/uL (ref 3.9–10.3)
lymph#: 0.6 10*3/uL — ABNORMAL LOW (ref 0.9–3.3)

## 2016-09-13 LAB — FERRITIN: Ferritin: 209 ng/ml (ref 9–269)

## 2016-09-13 MED ORDER — TRAMADOL HCL 50 MG PO TABS: 50.0000 mg | ORAL_TABLET | Freq: Four times a day (QID) | ORAL | 0 refills | Status: DC | PRN

## 2016-09-13 NOTE — Telephone Encounter (Signed)
Gave avs and calendar for October  °

## 2016-09-14 ENCOUNTER — Encounter: Payer: Self-pay | Admitting: Hematology

## 2016-09-14 ENCOUNTER — Ambulatory Visit
Admission: RE | Admit: 2016-09-14 | Discharge: 2016-09-14 | Disposition: A | Discharge status: Home / Self Care | Payer: Medicare HMO | Source: Ambulatory Visit | Attending: Radiation Oncology | Admitting: Radiation Oncology

## 2016-09-14 DIAGNOSIS — C20 Malignant neoplasm of rectum: Secondary | ICD-10-CM | POA: Diagnosis not present

## 2016-09-14 DIAGNOSIS — Z510 Encounter for antineoplastic radiation therapy: Secondary | ICD-10-CM | POA: Diagnosis not present

## 2016-09-14 DIAGNOSIS — Z833 Family history of diabetes mellitus: Secondary | ICD-10-CM | POA: Diagnosis not present

## 2016-09-14 DIAGNOSIS — Z808 Family history of malignant neoplasm of other organs or systems: Secondary | ICD-10-CM | POA: Diagnosis not present

## 2016-09-14 DIAGNOSIS — D500 Iron deficiency anemia secondary to blood loss (chronic): Secondary | ICD-10-CM | POA: Diagnosis not present

## 2016-09-15 ENCOUNTER — Ambulatory Visit
Admission: RE | Admit: 2016-09-15 | Discharge: 2016-09-15 | Disposition: A | Discharge status: Home / Self Care | Payer: Medicare HMO | Source: Ambulatory Visit | Attending: Radiation Oncology | Admitting: Radiation Oncology

## 2016-09-15 DIAGNOSIS — Z808 Family history of malignant neoplasm of other organs or systems: Secondary | ICD-10-CM | POA: Diagnosis not present

## 2016-09-15 DIAGNOSIS — Z510 Encounter for antineoplastic radiation therapy: Secondary | ICD-10-CM | POA: Diagnosis not present

## 2016-09-15 DIAGNOSIS — D500 Iron deficiency anemia secondary to blood loss (chronic): Secondary | ICD-10-CM | POA: Diagnosis not present

## 2016-09-15 DIAGNOSIS — C20 Malignant neoplasm of rectum: Secondary | ICD-10-CM | POA: Diagnosis not present

## 2016-09-15 DIAGNOSIS — Z833 Family history of diabetes mellitus: Secondary | ICD-10-CM | POA: Diagnosis not present

## 2016-09-16 ENCOUNTER — Ambulatory Visit: Payer: Medicare HMO

## 2016-09-16 ENCOUNTER — Encounter: Payer: Self-pay | Admitting: Radiation Oncology

## 2016-09-16 ENCOUNTER — Ambulatory Visit
Admission: RE | Admit: 2016-09-16 | Discharge: 2016-09-16 | Disposition: A | Discharge status: Home / Self Care | Payer: Medicare HMO | Source: Ambulatory Visit | Attending: Radiation Oncology | Admitting: Radiation Oncology

## 2016-09-16 DIAGNOSIS — C20 Malignant neoplasm of rectum: Secondary | ICD-10-CM | POA: Diagnosis not present

## 2016-09-16 DIAGNOSIS — Z510 Encounter for antineoplastic radiation therapy: Secondary | ICD-10-CM | POA: Diagnosis not present

## 2016-09-16 DIAGNOSIS — Z808 Family history of malignant neoplasm of other organs or systems: Secondary | ICD-10-CM | POA: Diagnosis not present

## 2016-09-16 DIAGNOSIS — D500 Iron deficiency anemia secondary to blood loss (chronic): Secondary | ICD-10-CM | POA: Diagnosis not present

## 2016-09-16 DIAGNOSIS — Z833 Family history of diabetes mellitus: Secondary | ICD-10-CM | POA: Diagnosis not present

## 2016-09-17 ENCOUNTER — Ambulatory Visit: Payer: Medicare HMO

## 2016-09-29 NOTE — Progress Notes (Signed)
  Radiation Oncology         [phone]) [phone] ________________________________  Name: Tonya [name] MRN: [id number]  Date: 09/16/2016  DOB: [birth year]  End of Treatment Note  Diagnosis:   Invasive adenocarcinoma of the distal rectum.    ICD-10-CM   1. Rectal adenocarcinoma ([place]) C20     Indication for treatment:  curative       Radiation treatment dates:   08/10/2016 to 09/16/2016  Site/dose:    1. The rectum was treated to 45 Gy in 25 fractions of 1.8 Gy. 2. The rectum was boosted to 5.4 Gy in 3 fractions of 1.8 Gy.  Beams/energy:    1. 3D // 15X, 6X 2. photon // 15X, 6X  Narrative: The patient tolerated radiation treatment relatively well.   Reports having small loose stools. Not taking Imodium. Eating fair and drinking plenty of water stated. Using Desitin on her bottom, as well as baby wipes and spraying bottom. Reports burning when voiding or having bowel movements.   Plan: The patient has completed radiation treatment. The patient will return to radiation oncology clinic for routine followup in one month. I advised them to call or return sooner if they have any questions or concerns related to their recovery or treatment.  ------------------------------------------------  Jodelle Gross, MD, PhD  This document serves as a record of services personally performed by Kyung Rudd, MD. It was created on his behalf by Arlyce Harman, a trained medical scribe. The creation of this record is based on the scribe's personal observations and the provider's statements to them. This document has been checked and approved by the attending provider.

## 2016-10-05 DIAGNOSIS — Z8049 Family history of malignant neoplasm of other genital organs: Secondary | ICD-10-CM | POA: Diagnosis not present

## 2016-10-05 DIAGNOSIS — C20 Malignant neoplasm of rectum: Secondary | ICD-10-CM | POA: Diagnosis not present

## 2016-10-05 DIAGNOSIS — Z800 Family history of malignant neoplasm of digestive organs: Secondary | ICD-10-CM | POA: Diagnosis not present

## 2016-10-05 DIAGNOSIS — Z923 Personal history of irradiation: Secondary | ICD-10-CM | POA: Diagnosis not present

## 2016-10-05 DIAGNOSIS — Z9221 Personal history of antineoplastic chemotherapy: Secondary | ICD-10-CM | POA: Diagnosis not present

## 2016-10-05 NOTE — Progress Notes (Signed)
[place]  [phone] [phone]  Clinic Follow Up Note   Patient Care Team: Lucille Passy, MD as PCP - General (Family Medicine) Irene Shipper, MD as Consulting Physician (Gastroenterology) Leighton Ruff, MD as Consulting Physician (General Surgery) Truitt Merle, MD as Consulting Physician (Hematology) Kyung Rudd, MD as Consulting Physician (Radiation Oncology) 10/07/2016  CHIEF COMPLAINTS:  Follow up rectal Adenocarcinoma  Oncology History   Cancer Staging Rectal adenocarcinoma [facility]) Staging form: Colon and Rectum, AJCC 8th Edition - Clinical stage from 07/09/2016: Stage IIA (cT3, cN0, cM0) - Signed by Truitt Merle, MD on 08/16/2016       Rectal adenocarcinoma ([place])   07/09/2016 Pathology Results    Diagnosis Surgical [P], rectum mass - INVASIVE ADENOCARCINOMA. - SEE COMMENT.      07/09/2016 Procedure    Colonoscopy A non-obstructing large friable mass was found in the distal rectum. This began at approximately 1 cm above the anal verge and extended proximal for a distance of 8-10 cm. The lesion was bulky and involved approximately 75% of the luminal circumference. Multiple biopsies were taken. A few small-mouthed diverticula were found in the left colon. The exam was otherwise without abnormality on direct views. Retroflexion not performed purposely.      07/12/2016 Imaging    CT C/A/P with contrast IMPRESSION: Rectal wall thickening/mass, compatible with known rectal adenocarcinoma. No findings specific for metastatic disease in the chest, abdomen, or pelvis. 2.2 cm probable hemangioma in segment 4B, although poorly evaluated. Consider MRI abdomen with/ without multihance contrast for definitive characterization, as clinically warranted. Bilateral adrenal nodules measuring up to 1.6 cm on the right, likely reflecting benign adrenal adenomas, although technically indeterminate. If MRI abdomen is performed, these can be definitively  characterized at that time.      07/20/2016 Initial Diagnosis    Rectal adenocarcinoma ([place])     08/03/2016 Imaging    MRI AP W WO Contrast 08/03/16 IMPRESSION: 1. Large rectal mass measures 6.1 cm in length. No obstruction identified. This is compatible with at least a T3bN0M0 lesion. 2. No specific features highly suspicious for nodal metastasis or metastatic disease to the upper abdomen. 3. Indeterminate arterial phase enhancing lesion within the anterior dome of liver measures less than 1 cm. This may represent a benign liver lesions such as FNH or adenoma. Less favored with the hypervascular liver metastasis. Followup imaging at 6 months with repeat MRI of the liver is advise. 4. Bilateral adrenal adenomas      08/04/2016 - 09/16/2016 Radiation Therapy    Concurrent chemo radiation with Dr. [name]      08/04/2016 - 09/16/2016 Chemotherapy    Concurrent chemo radiation with Xeloda, 1500mg  twice daily       HISTORY OF PRESENTING ILLNESS: 07/20/16 [name] 66 y.o. female is here because of recently discovered invasive adenocarcinoma. She is accompanied by a friend today. The patient underwent colonoscopy on 07/09/16 with Dr. [name]. According to Dr. Blanch Media note from that encounter, the patient presented with complaints of rectal bleeding and iron deficiency anemia. She reports she was told she has hemorrhoids and initially was not concerned about the rectal bleeding. Her most recent prior colonoscopy was 2005. On exam, Dr. Henrene Pastor noted a non-obstructing large friable mass was found in the distal rectum. This began at approximately 1 cm above the anal verge and extended proximal for a distance of 8-10 cm. The lesion was bulky and involved approximately 75% of the luminal circumference. Biopsy of the rectal  mass on 07/09/16 showed invasive adenocarcinoma.  CT C/A/P on 07/12/16 revealed rectal wall thickening/mass, compatible with known rectal adenocarcinoma.No findings specific for  metastatic disease in the chest, abdomen, or pelvis. 2.2 cm probable hemangioma in segment 4B, although poorly evaluated. Consider MRI abdomen with/ without multihance contrast for definitive characterization, as clinically warranted. Bilateral adrenal nodules measuring up to 1.6 cm on the right, likely reflecting benign adrenal adenomas, although technically indeterminate. If MRI abdomen is performed, these can be definitively characterized at that time.  On 07/19/16 the patient consulted with Dr. Leighton Ruff of [facility]. Per her note, she recommends complete metastatic workup with MRI to evaluate liver, adrenal gland, and rectum. The patient will follow up with Dr. Marcello Moores following neoadjuvant therapy to discuss surgical resection.  The patient reports she has lost 30 lbs since January. She reports ongoing fatigue, which her PCP related to iron deficiency and vitamin D deficiency. She reports occasional incontinence, and irregular bowel activity. She reports ongoing bright red rectal bleeding. She denies cramps or pain. She takes 2 iron pills daily.  CURRENT THERAPY: pending surgery with Dr. Drue Flirt on 11/26/8   INTERVAL HISTORY:  Illianna Paschal is here for a follow-up. She returns to the clinic today post chemoradiation.  She notes to recovering well from chemoradiation, but still has residual rectal pain. Her BM are formed and still has some bloody stool/hematochezia. Her surgery is 11/29/16 with Dr. Drue Flirt. Her appetite has improved and her weight is stable currently. It was discussed that she should gain weight before surgery. She asked about possible adjuvant chemotherapy and what that would look like for her treatment. It was discussed at length. Overall she is recovering well before surgery.     MEDICAL HISTORY:  Past Medical History:  Diagnosis Date  . Coronary artery disease 07/2003  . Myocardial infarction ([place])   . Obesity 2003   s/p gastric bypass      SURGICAL HISTORY: Past Surgical History:  Procedure Laterality Date  . CHOLECYSTECTOMY  1999  . GASTRIC BYPASS  2003    SOCIAL HISTORY: Social History   Social History  . Marital status: Single    Spouse name: N/A  . Number of children: 0  . Years of education: N/A   Occupational History  . retired    Social History Main Topics  . Smoking status: Never Smoker  . Smokeless tobacco: Never Used  . Alcohol use No  . Drug use: No  . Sexual activity: Not on file   Other Topics Concern  . Not on file   Social History Narrative  . No narrative on file    FAMILY HISTORY: Family History  Problem Relation Age of Onset  . Heart attack Mother        d.93  . Throat cancer Mother 54  . Clotting disorder Mother   . Liver disease Mother   . Kidney disease Mother   . Heart attack Father        d.92  . Prostate cancer Father 12  . Uterine cancer Sister 69       treated with total hysterectomy and radiation  . Thyroid cancer Sister 78       treated with thyroidectomy  . Cancer Maternal Uncle        d.80s unspecified type of cancer. History of smoking.  Marland Kitchen Uterine cancer Sister 37  . Colon cancer Cousin 83       paternal first-cousin    ALLERGIES:  is allergic  to bactrim [sulfamethoxazole-trimethoprim]; phenergan [promethazine hcl]; sulfa antibiotics; and penicillins.  MEDICATIONS:  Current Outpatient Prescriptions  Medication Sig Dispense Refill  . cholecalciferol (VITAMIN D) 1000 UNITS tablet Take 1,000 Units by mouth daily.      . ferrous sulfate (FERROUSUL) 325 (65 FE) MG tablet Take 1 tablet (325 mg total) by mouth daily with breakfast. (Patient taking differently: Take 325 mg by mouth. Take two by mouth daily) 30 tablet 6  . Ibuprofen-Diphenhydramine Cit (ADVIL PM) 200-38 MG TABS Take 1 tablet by mouth every 5 (five) hours as needed.     . Multiple Vitamin (MULTIVITAMIN) tablet Take 1 tablet by mouth daily.    . potassium chloride SA (K-DUR,KLOR-CON) 20 MEQ  tablet Take one tab three times daily x 2 days, then twice daily x 3 days then daily. (Patient taking differently: 20 mEq daily. Take one tab three times daily x 2 days, then twice daily x 3 days then daily.) 40 tablet 1  . ondansetron (ZOFRAN) 8 MG tablet Take 1 tablet (8 mg total) by mouth every 8 (eight) hours as needed for nausea or vomiting. (Patient not taking: Reported on 10/07/2016) 20 tablet 1  . prochlorperazine (COMPAZINE) 10 MG tablet Take 1 tablet (10 mg total) by mouth every 6 (six) hours as needed for nausea or vomiting. (Patient not taking: Reported on 10/07/2016) 30 tablet 1  . traMADol (ULTRAM) 50 MG tablet Take 1 tablet (50 mg total) by mouth every 6 (six) hours as needed. (Patient not taking: Reported on 10/07/2016) 90 tablet 0   Current Facility-Administered Medications  Medication Dose Route Frequency Provider Last Rate Last Dose  . 0.9 %  sodium chloride infusion  500 mL Intravenous Continuous Irene Shipper, MD        REVIEW OF SYSTEMS: Constitutional: Denies fevers, chills or abnormal night sweats (+) improved appetite  Eyes: Denies blurriness of vision, double vision or watery eyes Ears, nose, mouth, throat, and face: Denies mucositis or sore throat Respiratory: Denies cough, dyspnea or wheezes Cardiovascular: Denies palpitation, chest discomfort or lower extremity swelling Gastrointestinal:  Denies nausea, heartburn, (+) hematochezia (+) rectal pain  Skin: Denies abnormal skin rashes (+) skin breakdown from radiation Lymphatics: Denies new lymphadenopathy or easy bruising Neurological:Denies numbness, tingling or new weaknesses Behavioral/Psych: Mood is stable, no new changes  All other systems were reviewed with the patient and are negative.  PHYSICAL EXAMINATION:  ECOG PERFORMANCE STATUS: 1 - Symptomatic but completely ambulatory Vitals:   10/07/16 0815  BP: 96/68  Pulse: 66  Resp: 20  Temp: 98.3 F (36.8 C)  TempSrc: Oral  SpO2: 100%  Weight: 137 lb 12.8 oz  (62.5 kg)  Height: 5' (1.524 m)    GENERAL:alert, no distress and comfortable SKIN: skin color, texture, turgor are normal, no rashes or significant lesions, Mild erythema parenal area, no skin breakdown or discharge  EYES: normal, conjunctiva are pink and non-injected, sclera clear OROPHARYNX:no exudate, no erythema and lips, buccal mucosa, and tongue normal NECK: supple, thyroid normal size, non-tender, without nodularity LYMPH:  no palpable lymphadenopathy in the cervical, axillary or inguinal LUNGS: clear to auscultation and percussion with normal breathing effort HEART: regular rate & rhythm and no murmurs and no lower extremity edema ABDOMEN:abdomen soft, non-tender, and normal bowel sounds. No organomegaly. Musculoskeletal:no cyanosis of digits and no clubbing PSYCH: alert & oriented x 3 with fluent speech NEURO: no focal motor/sensory deficits Rectal Exam: Deferred today   LABORATORY DATA:  I have reviewed the data as listed. CBC Latest  Ref Rng & Units 10/07/2016 09/13/2016 09/07/2016  WBC 3.9 - 10.3 10e3/uL 6.3 7.2 7.5  Hemoglobin 11.6 - 15.9 g/dL 10.9(L) 11.7 11.3(L)  Hematocrit 34.8 - 46.6 % 33.7(L) 35.9 34.2(L)  Platelets 145 - 400 10e3/uL 240 258 197   CMP Latest Ref Rng & Units 10/07/2016 09/13/2016 09/07/2016  Glucose 70 - 140 mg/dl 85 96 92  BUN 7.0 - 26.0 mg/dL 19.5 15.7 17.6  Creatinine 0.6 - 1.1 mg/dL 0.7 0.6 0.7  Sodium 136 - 145 mEq/L 142 139 141  Potassium 3.5 - 5.1 mEq/L 3.3(L) 4.0 2.9(LL)  Chloride 96 - 112 mEq/L - - -  CO2 22 - 29 mEq/L 23 23 27   Calcium 8.4 - 10.4 mg/dL 9.0 9.3 8.8  Total Protein 6.4 - 8.3 g/dL 5.9(L) 6.0(L) 5.4(L)  Total Bilirubin 0.20 - 1.20 mg/dL 0.26 0.36 0.29  Alkaline Phos 40 - 150 U/L 79 78 67  AST 5 - 34 U/L 18 16 15   ALT 0 - 55 U/L 18 14 19     PATHOLOGY: 07/09/2016: Diagnosis Surgical [P], rectum mass - INVASIVE ADENOCARCINOMA.  RADIOGRAPHIC STUDIES: I have personally reviewed the radiological images as listed and agreed with  the findings in the report. No results found.  ASSESSMENT & PLAN:  Tonya [name] is a 66 y.o. woman with past medical history of gastric bypass surgery, presented with rectal bleeding, loose bowel movement and stool incontinence for one half years, was recently diagnosed invasive rectal adenocarcinoma.  1. Invasive low rectal adenocarcinoma, cT3N0M0 -I reviewed his CT scan findings, and surgical pathology results in great details with patient and her friend.  -I discussed her abdominal and pelvic MRI scan findings, which showed a T3 N0 rectal tumor. The liver lesion is indeterminate, low possibility of metastasis. We'll continue observation, and repeat scan in 3-6 months. -Given her T3 disease, I recommend neoadjuvant chemotherapy and irradiation, followed by surgery and adjuvant chemotherapy. -We discussed the role of neoadjuvant chemotherapy for colorectal cancer, to shrink the tumor and reduce the risk of cancer recurrence after surgery -She has seen colorectal surgeon Dr. Marcello Moores, surgical option of APR vs low anterior resection were discussed, Dr. Marcello Moores feels she needs permanent colostomy. Due to her prior gastric bypass surgery, however patient is very reluctant to have surgery due to the need of colostomy bag. -She has seen colorectal surgeon Dr. Drue Flirt at [facility] and plan to switch. She had a f/u appointment on to discuss surgery. I strongly encouraged her to consider surgery. -Patient agrees to proceed with concurrent chemotherapy and radiation. She completed on 09/16/16. Patient has tolerated concurrent chemotherapy and radiation very well., other than rectal pain.  -Labs reviewed, she is still anemic with hg at 10.9, but she does not need a blood transfusion. Her CBCs are still recovering from treatment.  -I will defer her rectal exam today due to pain from chemoradiation.  -Her weight is stale but still lower, I encouraged her eats more, gain weight back and try to be more active  before surgery.  -I discussed that she is likely to take adjuvant chemo after her surgery on 11/29/16 with Dr. Drue Flirt. How aggressive the treatment will depend on pathology results. We discussed the option of single agent Xeloda, or combination chemotherapy such as FOLFOX or CAPOX.  -She knows to contact us if she has any concerns. -I'll see her back after her surgery.    2. Iron Deficient Anemia  - The patient takes 2 iron supplements daily. - Her iron has been somewhat  low. - She will stop taking aspirin to reduce her bleeding risk. -Repeat iron studies showed iron deficiency, she will receive 1 dose IV iron Feraheme today. -Her levels are improved now, Ferritin is 272 as of 08/16/2016  -Iron study is normal (09/13/16)  3. Genetics -Patient has 2 sisters who had uterine cancer, this is suspicious for lynch syndrome. - I encouraged the patient to pursue genetic testing based on her family history. This will determine if the patient has inheritable genetic syndrome, such as Lynch syndrome - The patient is in agreement and I referred her to  Genetics -She will do testing  on 10/08/16  before surgery.   4. History of gastric bypass surgery in 2003 -for obesity   5. History of DM and HTN -Resolved after her gastric bypass surgery. -We'll monitor her blood pressure and glucose closely during her chemotherapy treatment.  6. Liver lesion -Indeterminate, no high suspicion for metastatic disease, plan to repeat liver MRI in 6 months.  7. Hypokalemia  - Low and will recheck next week. - I advised her to eat foods that are high in potassium -I previously prescribed potassium chloride 20 meq daily -her potassium is 4.0 previously (09/13/16). She will continue with 1 potassium a day until next lab   PLAN: -Surgery with Dr. Drue Flirt on 11/29/16 -Lab and f/u the week of 12/17    No orders of the defined types were placed in this encounter.  All questions were answered. The patient knows to  call the clinic with any problems, questions or concerns.  I spent 20 minutes counseling the patient face to face. The total time spent in the appointment was 25 minutes and more than 50% was on counseling.  This document serves as a record of services personally performed by Truitt Merle, MD. It was created on her behalf by Joslyn Devon, a trained medical scribe. The creation of this record is based on the scribe's personal observations and the provider's statements to them. This document has been checked and approved by the attending provider.     Truitt Merle, MD 10/07/2016

## 2016-10-07 ENCOUNTER — Ambulatory Visit (HOSPITAL_BASED_OUTPATIENT_CLINIC_OR_DEPARTMENT_OTHER): Payer: Medicare HMO | Admitting: Hematology

## 2016-10-07 ENCOUNTER — Encounter: Payer: Self-pay | Admitting: Hematology

## 2016-10-07 ENCOUNTER — Telehealth: Payer: Self-pay | Admitting: Hematology

## 2016-10-07 ENCOUNTER — Other Ambulatory Visit (HOSPITAL_BASED_OUTPATIENT_CLINIC_OR_DEPARTMENT_OTHER): Payer: Medicare HMO

## 2016-10-07 VITALS — BP 96/68 | HR 66 | Temp 98.3°F | Resp 20 | Ht 60.0 in | Wt 137.8 lb

## 2016-10-07 DIAGNOSIS — C20 Malignant neoplasm of rectum: Secondary | ICD-10-CM

## 2016-10-07 DIAGNOSIS — K769 Liver disease, unspecified: Secondary | ICD-10-CM | POA: Diagnosis not present

## 2016-10-07 DIAGNOSIS — E559 Vitamin D deficiency, unspecified: Secondary | ICD-10-CM

## 2016-10-07 DIAGNOSIS — E876 Hypokalemia: Secondary | ICD-10-CM

## 2016-10-07 DIAGNOSIS — D500 Iron deficiency anemia secondary to blood loss (chronic): Secondary | ICD-10-CM | POA: Diagnosis not present

## 2016-10-07 LAB — COMPREHENSIVE METABOLIC PANEL WITH GFR
ALT: 18 U/L (ref 0–55)
AST: 18 U/L (ref 5–34)
Albumin: 2.9 g/dL — ABNORMAL LOW (ref 3.5–5.0)
Alkaline Phosphatase: 79 U/L (ref 40–150)
Anion Gap: 9 meq/L (ref 3–11)
BUN: 19.5 mg/dL (ref 7.0–26.0)
CO2: 23 meq/L (ref 22–29)
Calcium: 9 mg/dL (ref 8.4–10.4)
Chloride: 111 meq/L — ABNORMAL HIGH (ref 98–109)
Creatinine: 0.7 mg/dL (ref 0.6–1.1)
EGFR: >90 ml/min/1.73 m2 (ref >90)
Glucose: 85 mg/dL (ref 70–140)
Potassium: 3.3 meq/L — ABNORMAL LOW (ref 3.5–5.1)
Sodium: 142 meq/L (ref 136–145)
Total Bilirubin: 0.26 mg/dL (ref 0.20–1.20)
Total Protein: 5.9 g/dL — ABNORMAL LOW (ref 6.4–8.3)

## 2016-10-07 LAB — CBC & DIFF AND RETIC
BASO%: 0.2 % (ref 0.0–2.0)
Basophils Absolute: 0 10e3/uL (ref 0.0–0.1)
EOS%: 1.1 % (ref 0.0–7.0)
Eosinophils Absolute: 0.1 10e3/uL (ref 0.0–0.5)
HCT: 33.7 % — ABNORMAL LOW (ref 34.8–46.6)
HGB: 10.9 g/dL — ABNORMAL LOW (ref 11.6–15.9)
Immature Retic Fract: 8.8 % (ref 1.60–10.00)
LYMPH%: 4.3 % — ABNORMAL LOW (ref 14.0–49.7)
MCH: 31.8 pg (ref 25.1–34.0)
MCHC: 32.3 g/dL (ref 31.5–36.0)
MCV: 98.3 fL (ref 79.5–101.0)
MONO#: 0.6 10e3/uL (ref 0.1–0.9)
MONO%: 9 % (ref 0.0–14.0)
NEUT#: 5.4 10e3/uL (ref 1.5–6.5)
NEUT%: 85.4 % — ABNORMAL HIGH (ref 38.4–76.8)
Platelets: 240 10e3/uL (ref 145–400)
RBC: 3.43 10e6/uL — ABNORMAL LOW (ref 3.70–5.45)
RDW: 16.5 % — ABNORMAL HIGH (ref 11.2–14.5)
Retic %: 2.58 % — ABNORMAL HIGH (ref 0.70–2.10)
Retic Ct Abs: 88.49 10e3/uL (ref 33.70–90.70)
WBC: 6.3 10e3/uL (ref 3.9–10.3)
lymph#: 0.3 10e3/uL — ABNORMAL LOW (ref 0.9–3.3)

## 2016-10-07 LAB — FERRITIN: FERRITIN: 142 ng/mL (ref 9–269)

## 2016-10-07 LAB — IRON AND TIBC
%SAT: 37 % (ref 21–57)
Iron: 87 ug/dL (ref 41–142)
TIBC: 236 ug/dL (ref 236–444)
UIBC: 149 ug/dL (ref 120–384)

## 2016-10-07 NOTE — Telephone Encounter (Signed)
Scheduled appt per 10/4 los - gave patient AVS and calender per los  ° °

## 2016-10-08 DIAGNOSIS — Z85048 Personal history of other malignant neoplasm of rectum, rectosigmoid junction, and anus: Secondary | ICD-10-CM | POA: Diagnosis not present

## 2016-10-08 DIAGNOSIS — Z8049 Family history of malignant neoplasm of other genital organs: Secondary | ICD-10-CM | POA: Diagnosis not present

## 2016-10-08 DIAGNOSIS — Z800 Family history of malignant neoplasm of digestive organs: Secondary | ICD-10-CM | POA: Diagnosis not present

## 2016-10-08 DIAGNOSIS — Z8042 Family history of malignant neoplasm of prostate: Secondary | ICD-10-CM | POA: Diagnosis not present

## 2016-10-13 ENCOUNTER — Other Ambulatory Visit: Payer: Self-pay | Admitting: Hematology

## 2016-10-13 NOTE — Telephone Encounter (Signed)
TCT patient to advise of low potassium level from 10/07/16. Informed pt that Dr. Burr Medico wants her to 2 tabs a day of her Klor-Con for 5 days then resume 1 x a day after that. Pt voiced understanding. Refills for K+ called in.

## 2016-10-14 ENCOUNTER — Telehealth: Payer: Self-pay | Admitting: *Deleted

## 2016-10-14 NOTE — Telephone Encounter (Signed)
TCT patient with results of recent iron studies. Spoke with patient and advised her that iron studies were normal and to continue oral iron until she sees Dr. Burr Medico next time.

## 2016-11-01 ENCOUNTER — Encounter: Payer: Self-pay | Admitting: Radiation Oncology

## 2016-11-01 ENCOUNTER — Ambulatory Visit
Admission: RE | Admit: 2016-11-01 | Discharge: 2016-11-01 | Disposition: A | Discharge status: Home / Self Care | Payer: Medicare HMO | Source: Ambulatory Visit | Attending: Radiation Oncology | Admitting: Radiation Oncology

## 2016-11-01 VITALS — BP 115/81 | HR 72 | Temp 98.5°F | Resp 18 | Ht <= 58 in | Wt 141.6 lb

## 2016-11-01 DIAGNOSIS — Z833 Family history of diabetes mellitus: Secondary | ICD-10-CM | POA: Diagnosis not present

## 2016-11-01 DIAGNOSIS — Z510 Encounter for antineoplastic radiation therapy: Secondary | ICD-10-CM | POA: Insufficient documentation

## 2016-11-01 DIAGNOSIS — C20 Malignant neoplasm of rectum: Secondary | ICD-10-CM | POA: Diagnosis not present

## 2016-11-01 DIAGNOSIS — D500 Iron deficiency anemia secondary to blood loss (chronic): Secondary | ICD-10-CM | POA: Insufficient documentation

## 2016-11-01 DIAGNOSIS — Z808 Family history of malignant neoplasm of other organs or systems: Secondary | ICD-10-CM | POA: Insufficient documentation

## 2016-11-01 NOTE — Progress Notes (Signed)
Radiation Oncology         [phone] ________________________________  Name: Tonya [name] MRN: [id number]  Date of Service: 11/01/2016 DOB: 03-13-1950  Post Treatment Note  CC: Lucille Passy, MD  Irene Shipper, MD  Diagnosis:   Stage IIA, cT3N0M0 adenocarcinoma of the rectum.   Interval Since Last Radiation:  7 weeks   08/10/2016 to 09/16/2016:   1. The rectum was treated to 45 Gy in 25 fractions of 1.8 Gy. 2. The rectum was boosted to 5.4 Gy in 3 fractions of 1.8 Gy.   Narrative:  The patient returns today for routine follow-up. In summary this is a pleasant 66 y.o. woman who was diagnosed with stage II a T3 N0 adenocarcinoma of the rectum, she is completed neoadjuvant chemoradiation, and is planning to undergo surgical resection with Dr. Drue Flirt at [facility] on 11/29/16.                            On review of systems, the patient states she is been doing well overall. She states that her rectal region is improving in terms of discomfort, she is able to sit for longer periods of time, and his able to gain several pounds back. She is concerned about the possibility of having a permanent colostomy, and reports that she has been nervous about what this might be like. She reports that she is not planning on any additional imaging and is hopeful that her tumor is improving in size. She states that clinically she can tell based on the fact that her bowels are moving well without any looseness, or pain with having a bowel movement. She has had 2 episodes of spotting since radiation from the rectum, and reports that this happened about 2 days ago. She has not seen any since.  ALLERGIES:  is allergic to bactrim [sulfamethoxazole-trimethoprim]; phenergan [promethazine hcl]; sulfa antibiotics; and penicillins.  Meds: Current Outpatient Prescriptions  Medication Sig Dispense Refill  . cholecalciferol (VITAMIN D) 1000 UNITS tablet Take 1,000 Units by mouth daily.      . ferrous sulfate  (FERROUSUL) 325 (65 FE) MG tablet Take 1 tablet (325 mg total) by mouth daily with breakfast. (Patient taking differently: Take 325 mg by mouth. Take two by mouth daily) 30 tablet 6  . ibuprofen (ADVIL,MOTRIN) 200 MG tablet Take 200 mg by mouth every 6 (six) hours as needed.    . Ibuprofen-Diphenhydramine Cit (ADVIL PM) 200-38 MG TABS Take 1 tablet by mouth every 5 (five) hours as needed.     . Multiple Vitamin (MULTIVITAMIN) tablet Take 1 tablet by mouth daily.    . potassium chloride SA (K-DUR,KLOR-CON) 20 MEQ tablet Take 1 tablet (20 mEq total) by mouth 2 (two) times daily. For 5 days Then take once a day 40 tablet 1  . ondansetron (ZOFRAN) 8 MG tablet Take 1 tablet (8 mg total) by mouth every 8 (eight) hours as needed for nausea or vomiting. (Patient not taking: Reported on 10/07/2016) 20 tablet 1  . prochlorperazine (COMPAZINE) 10 MG tablet Take 1 tablet (10 mg total) by mouth every 6 (six) hours as needed for nausea or vomiting. (Patient not taking: Reported on 10/07/2016) 30 tablet 1  . traMADol (ULTRAM) 50 MG tablet Take 1 tablet (50 mg total) by mouth every 6 (six) hours as needed. (Patient not taking: Reported on 10/07/2016) 90 tablet 0   Current Facility-Administered Medications  Medication Dose Route Frequency Provider Last Rate  Last Dose  . 0.9 %  sodium chloride infusion  500 mL Intravenous Continuous Irene Shipper, MD        Physical Findings:  height is 5" (0.127 m) (abnormal) and weight is 141 lb 9.6 oz (64.2 kg). Her oral temperature is 98.5 F (36.9 C). Her blood pressure is 115/81 and her pulse is 72. Her respiration is 18 and oxygen saturation is 100%.  Pain Assessment Pain Score: 0-No pain/10 In general this is a well appearing caucasian female in no acute distress. She's alert and oriented x4 and appropriate throughout the examination. Cardiopulmonary assessment is negative for acute distress and she exhibits normal effort. Rectal exam is deferred.  Lab Findings: Lab Results   Component Value Date   WBC 6.3 10/07/2016   HGB 10.9 (L) 10/07/2016   HCT 33.7 (L) 10/07/2016   MCV 98.3 10/07/2016   PLT 240 10/07/2016     Radiographic Findings: No results found.  Impression/Plan: 1. Stage IIA, cT3N0M0 adenocarcinoma of the rectum. The patient appears to be doing well and is recovering from the effects of radiotherapy. She will continue to follow with Dr. Burr Medico and medical oncology following her surgical procedure. I encouraged her to call Dr. Jennette Bill office to see if she did meet with the wound and ostomy care team prior to her surgery so that this might alleviate some of her reservations about ostomy care.We would be happy to see her back as needed moving forward, she states agreement and understanding.     Carola Rhine, PAC

## 2016-11-09 DIAGNOSIS — Z880 Allergy status to penicillin: Secondary | ICD-10-CM | POA: Diagnosis not present

## 2016-11-09 DIAGNOSIS — E869 Volume depletion, unspecified: Secondary | ICD-10-CM | POA: Diagnosis not present

## 2016-11-09 DIAGNOSIS — Z9221 Personal history of antineoplastic chemotherapy: Secondary | ICD-10-CM | POA: Diagnosis not present

## 2016-11-09 DIAGNOSIS — Z923 Personal history of irradiation: Secondary | ICD-10-CM | POA: Diagnosis not present

## 2016-11-09 DIAGNOSIS — C20 Malignant neoplasm of rectum: Secondary | ICD-10-CM | POA: Diagnosis not present

## 2016-11-09 DIAGNOSIS — I252 Old myocardial infarction: Secondary | ICD-10-CM | POA: Diagnosis not present

## 2016-11-09 DIAGNOSIS — Z9049 Acquired absence of other specified parts of digestive tract: Secondary | ICD-10-CM | POA: Diagnosis not present

## 2016-11-09 DIAGNOSIS — N179 Acute kidney failure, unspecified: Secondary | ICD-10-CM | POA: Diagnosis not present

## 2016-11-09 DIAGNOSIS — Z01812 Encounter for preprocedural laboratory examination: Secondary | ICD-10-CM | POA: Diagnosis not present

## 2016-11-09 DIAGNOSIS — I2510 Atherosclerotic heart disease of native coronary artery without angina pectoris: Secondary | ICD-10-CM | POA: Diagnosis not present

## 2016-11-09 DIAGNOSIS — I959 Hypotension, unspecified: Secondary | ICD-10-CM | POA: Diagnosis not present

## 2016-11-10 DIAGNOSIS — Z01810 Encounter for preprocedural cardiovascular examination: Secondary | ICD-10-CM | POA: Diagnosis not present

## 2016-11-10 DIAGNOSIS — C20 Malignant neoplasm of rectum: Secondary | ICD-10-CM | POA: Diagnosis not present

## 2016-11-12 ENCOUNTER — Telehealth: Payer: Self-pay | Admitting: *Deleted

## 2016-11-12 NOTE — Telephone Encounter (Signed)
Pt called informing Dr. Burr Medico of her new surgery date  - December 3.   Wanted to know if her office visit with Dr. Burr Medico on 12/19 should be changed to a later date. Pt's    Phone      [phone].

## 2016-11-12 NOTE — Telephone Encounter (Signed)
Spoke with pt, and informed her of Dr. Ernestina Penna instructions.  Pt wished to keep scheduled appt for 12/22/16 as is. Stated she will call back if appts need to be rescheduled.

## 2016-11-12 NOTE — Telephone Encounter (Signed)
OK to keep 12/19 or postpone for a week if pt wishes, thanks   Truitt Merle MD

## 2016-12-06 DIAGNOSIS — E869 Volume depletion, unspecified: Secondary | ICD-10-CM | POA: Diagnosis not present

## 2016-12-06 DIAGNOSIS — Z9221 Personal history of antineoplastic chemotherapy: Secondary | ICD-10-CM | POA: Diagnosis not present

## 2016-12-06 DIAGNOSIS — G8918 Other acute postprocedural pain: Secondary | ICD-10-CM | POA: Diagnosis not present

## 2016-12-06 DIAGNOSIS — Z923 Personal history of irradiation: Secondary | ICD-10-CM | POA: Diagnosis not present

## 2016-12-06 DIAGNOSIS — N179 Acute kidney failure, unspecified: Secondary | ICD-10-CM | POA: Diagnosis not present

## 2016-12-06 DIAGNOSIS — C20 Malignant neoplasm of rectum: Secondary | ICD-10-CM | POA: Diagnosis not present

## 2016-12-06 DIAGNOSIS — Z9049 Acquired absence of other specified parts of digestive tract: Secondary | ICD-10-CM | POA: Diagnosis not present

## 2016-12-06 DIAGNOSIS — I2510 Atherosclerotic heart disease of native coronary artery without angina pectoris: Secondary | ICD-10-CM | POA: Diagnosis not present

## 2016-12-06 DIAGNOSIS — Z880 Allergy status to penicillin: Secondary | ICD-10-CM | POA: Diagnosis not present

## 2016-12-06 DIAGNOSIS — I959 Hypotension, unspecified: Secondary | ICD-10-CM | POA: Diagnosis not present

## 2016-12-06 DIAGNOSIS — I252 Old myocardial infarction: Secondary | ICD-10-CM | POA: Diagnosis not present

## 2016-12-13 DIAGNOSIS — Z483 Aftercare following surgery for neoplasm: Secondary | ICD-10-CM | POA: Diagnosis not present

## 2016-12-13 DIAGNOSIS — Z432 Encounter for attention to ileostomy: Secondary | ICD-10-CM | POA: Diagnosis not present

## 2016-12-13 DIAGNOSIS — C20 Malignant neoplasm of rectum: Secondary | ICD-10-CM | POA: Diagnosis not present

## 2016-12-14 DIAGNOSIS — C20 Malignant neoplasm of rectum: Secondary | ICD-10-CM | POA: Diagnosis not present

## 2016-12-14 DIAGNOSIS — Z432 Encounter for attention to ileostomy: Secondary | ICD-10-CM | POA: Diagnosis not present

## 2016-12-14 DIAGNOSIS — Z483 Aftercare following surgery for neoplasm: Secondary | ICD-10-CM | POA: Diagnosis not present

## 2016-12-14 DIAGNOSIS — Z932 Ileostomy status: Secondary | ICD-10-CM | POA: Diagnosis not present

## 2016-12-17 DIAGNOSIS — Z483 Aftercare following surgery for neoplasm: Secondary | ICD-10-CM | POA: Diagnosis not present

## 2016-12-17 DIAGNOSIS — C20 Malignant neoplasm of rectum: Secondary | ICD-10-CM | POA: Diagnosis not present

## 2016-12-17 DIAGNOSIS — Z432 Encounter for attention to ileostomy: Secondary | ICD-10-CM | POA: Diagnosis not present

## 2016-12-20 NOTE — Progress Notes (Signed)
[name]  [phone]) [phone] [phone]  Clinic Follow Up Note   Patient Care Team: Tonya Passy, MD as PCP - General (Family Medicine) Tonya Shipper, MD as Consulting Physician (Gastroenterology) Tonya Ruff, MD as Consulting Physician (General Surgery) Tonya Merle, MD as Consulting Physician (Hematology) Tonya Rudd, MD as Consulting Physician (Radiation Oncology) 12/22/2016  CHIEF COMPLAINTS:  Follow up rectal Adenocarcinoma  Oncology History   Cancer Staging Rectal adenocarcinoma [facility]) Staging form: Colon and Rectum, AJCC 8th Edition - Clinical stage from 07/09/2016: Stage IIA (cT3, cN0, cM0) - Signed by Tonya Merle, MD on 08/16/2016       Rectal adenocarcinoma ([place])   07/09/2016 Pathology Results    Diagnosis Surgical [P], rectum mass - INVASIVE ADENOCARCINOMA. - SEE COMMENT.      07/09/2016 Procedure    Colonoscopy A non-obstructing large friable mass was found in the distal rectum. This began at approximately 1 cm above the anal verge and extended proximal for a distance of 8-10 cm. The lesion was bulky and involved approximately 75% of the luminal circumference. Multiple biopsies were taken. A few small-mouthed diverticula were found in the left colon. The exam was otherwise without abnormality on direct views. Retroflexion not performed purposely.      07/12/2016 Imaging    CT C/A/P with contrast IMPRESSION: Rectal wall thickening/mass, compatible with known rectal adenocarcinoma. No findings specific for metastatic disease in the chest, abdomen, or pelvis. 2.2 cm probable hemangioma in segment 4B, although poorly evaluated. Consider MRI abdomen with/ without multihance contrast for definitive characterization, as clinically warranted. Bilateral adrenal nodules measuring up to 1.6 cm on the right, likely reflecting benign adrenal adenomas, although technically indeterminate. If MRI abdomen is performed, these can be definitively  characterized at that time.      07/20/2016 Initial Diagnosis    Rectal adenocarcinoma ([place])      08/03/2016 Imaging    MRI AP W WO Contrast 08/03/16 IMPRESSION: 1. Large rectal mass measures 6.1 cm in length. No obstruction identified. This is compatible with at least a T3bN0M0 lesion. 2. No specific features highly suspicious for nodal metastasis or metastatic disease to the upper abdomen. 3. Indeterminate arterial phase enhancing lesion within the anterior dome of liver measures less than 1 cm. This may represent a benign liver lesions such as FNH or adenoma. Less favored with the hypervascular liver metastasis. Followup imaging at 6 months with repeat MRI of the liver is advise. 4. Bilateral adrenal adenomas      08/04/2016 - 09/16/2016 Radiation Therapy    Concurrent chemo radiation with Dr. Lisbeth [name]      08/04/2016 - 09/16/2016 Chemotherapy    Concurrent chemo radiation with Xeloda, 1500mg  twice daily      12/06/2016 Surgery    ULTRA LOW ANTERIOR RESECTION OF SIGMOID COLON AND RECTUM WITH COLOANAL ANASTAMOSIS AND LOOP ILEOSTOMY  by Dr. Drue [name] at [place]  12/06/16      12/06/2016 Pathology Results    FINAL PATHOLOGIC DIAGNOSIS 12/06/16 MICROSCOPIC EXAMINATION AND DIAGNOSIS  A.ANAL CANAL, MUCOSECTOMY: Benign rectal and fibroadipose tissue. Negative for malignancy.  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 Mucinous tumor focally invades through the muscularis propria into pericolorectal tissue. Radial margin interpreted as involved by invasive carcinoma (invasive acellular mucin with rare viable tumor cells focally comes to within 0.2 mm [0.02 cm] of the inked radial soft tissue margin). Treatment effect present, predominantly  invasive acellular mucin associated with only very rare small groups of viable tumor cells (near complete response). 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 AJCC Pathologic Stage: ypT3 pN1a. See Cancer Case Summary.  C.POSTERIOR ANAL CANAL, EXCISION: Benign rectal tissue. Negative for malignancy.       HISTORY OF PRESENTING ILLNESS: 07/20/16 Tonya [name] [facility] 66 y.o. female is here because of recently discovered invasive adenocarcinoma. She is accompanied by a friend today. The patient underwent colonoscopy on 07/09/16 with Dr. Scarlette [name]. According to Dr. Blanch [name] note from that encounter, the patient presented with complaints of rectal bleeding and iron deficiency anemia. She reports she was told she has hemorrhoids and initially was not concerned about the rectal bleeding. Her most recent prior colonoscopy was 2005. On exam, Dr. Henrene [name] noted a non-obstructing large friable mass was found in the distal rectum. This began at approximately 1 cm above the anal verge and extended proximal for a distance of 8-10 cm. The lesion was bulky and involved approximately 75% of the luminal circumference. Biopsy of the rectal mass on 07/09/16 showed invasive adenocarcinoma.  CT C/A/P on 07/12/16 revealed rectal wall thickening/mass, compatible with known rectal adenocarcinoma.No findings specific for metastatic disease in the chest, abdomen, or pelvis. 2.2 cm probable hemangioma in segment 4B, although poorly evaluated. Consider MRI abdomen with/ without multihance contrast for definitive characterization, as clinically warranted. Bilateral adrenal nodules measuring up to 1.6 cm on the right, likely reflecting benign adrenal adenomas, although technically indeterminate. If MRI abdomen is performed, these can be  definitively characterized at that time.  On 07/19/16 the patient consulted with Dr. Leighton [name] of [facility]. Per her note, she recommends complete metastatic workup with MRI to evaluate liver, adrenal gland, and rectum. The patient will follow up with Dr. Marcello [name] following neoadjuvant therapy to discuss surgical resection.  The patient reports she has lost 30 lbs since January. She reports ongoing fatigue, which her PCP related to iron deficiency and vitamin D deficiency. She reports occasional incontinence, and irregular bowel activity. She reports ongoing bright red rectal bleeding. She denies cramps or pain. She takes 2 iron pills daily.  CURRENT THERAPY: pending CAPOX every 3 weeks    INTERVAL HISTORY:  Tonya [name] is here for a follow-up post surgery. She returns to the clinic today accompanied by her friend Manuela Schwartz. She had surgery on 12/06/16 and has colostomy bag now. She plans to have it reversed after she healed. She is drinking plenty. She has no appetite and food does not appeal to her. She does take supplemental drinks. She cannot eat several types of food as it will effect her stomach. She has loss weight. She personally does not like nor want her colostomy bag and wants to get it reversed as soon as possible. She notes she has no issues with leaking from her bag. She is very unhappy about the bag. She is not in pain. She has no energy.   MEDICAL HISTORY:  Past Medical History:  Diagnosis Date  . Coronary artery disease 07/2003  . Myocardial infarction ([place])   . Obesity 2003   s/p gastric bypass     SURGICAL HISTORY: Past Surgical History:  Procedure Laterality Date  . CHOLECYSTECTOMY  1999  . GASTRIC BYPASS  2003    SOCIAL HISTORY: Social History   Socioeconomic History  . Marital status: Single    Spouse  name: Not on file  . Number of children: 0  . Years of education: Not on file  . Highest education level: Not on file  Social Needs  .  Financial resource strain: Not on file  . Food insecurity - worry: Not on file  . Food insecurity - inability: Not on file  . Transportation needs - medical: Not on file  . Transportation needs - non-medical: Not on file  Occupational History  . Occupation: retired  Tobacco Use  . Smoking status: Never Smoker  . Smokeless tobacco: Never Used  Substance and Sexual Activity  . Alcohol use: No  . Drug use: No  . Sexual activity: Not on file  Other Topics Concern  . Not on file  Social History Narrative  . Not on file    FAMILY HISTORY: Family History  Problem Relation Age of Onset  . Heart attack Mother        d.93  . Throat cancer Mother 77  . Clotting disorder Mother   . Liver disease Mother   . Kidney disease Mother   . Heart attack Father        d.92  . Prostate cancer Father 74  . Uterine cancer Sister 10       treated with total hysterectomy and radiation  . Thyroid cancer Sister 58       treated with thyroidectomy  . Cancer Maternal Uncle        d.80s unspecified type of cancer. History of smoking.  Marland Kitchen Uterine cancer Sister 50  . Colon cancer Cousin 68       paternal first-cousin    ALLERGIES:  is allergic to bactrim [sulfamethoxazole-trimethoprim]; phenergan [promethazine hcl]; sulfa antibiotics; and penicillins.  MEDICATIONS:  Current Outpatient Medications  Medication Sig Dispense Refill  . cholecalciferol (VITAMIN D) 1000 UNITS tablet Take 1,000 Units by mouth daily.      . diphenoxylate-atropine (LOMOTIL) 2.5-0.025 MG tablet Take 1-2 tablets by mouth 4 (four) times daily as needed for diarrhea or loose stools. 60 tablet 0  . ferrous sulfate (FERROUSUL) 325 (65 FE) MG tablet Take 1 tablet (325 mg total) by mouth daily with breakfast. (Patient taking differently: Take 325 mg by mouth. Take two by mouth daily) 30 tablet 6  . ibuprofen (ADVIL,MOTRIN) 200 MG tablet Take 200 mg by mouth every 6 (six) hours as needed.    . Ibuprofen-Diphenhydramine Cit (ADVIL PM)  200-38 MG TABS Take 1 tablet by mouth every 5 (five) hours as needed.     . mirtazapine (REMERON) 15 MG tablet Take 1 tablet (15 mg total) by mouth at bedtime. 30 tablet 1  . Multiple Vitamin (MULTIVITAMIN) tablet Take 1 tablet by mouth daily.    . ondansetron (ZOFRAN) 8 MG tablet Take 1 tablet (8 mg total) by mouth every 8 (eight) hours as needed for nausea or vomiting. (Patient not taking: Reported on 10/07/2016) 20 tablet 1  . potassium chloride SA (K-DUR,KLOR-CON) 20 MEQ tablet Take 1 tablet (20 mEq total) by mouth 2 (two) times daily. For 5 days Then take once a day 40 tablet 1  . prochlorperazine (COMPAZINE) 10 MG tablet Take 1 tablet (10 mg total) by mouth every 6 (six) hours as needed for nausea or vomiting. (Patient not taking: Reported on 10/07/2016) 30 tablet 1  . traMADol (ULTRAM) 50 MG tablet Take 1 tablet (50 mg total) by mouth every 6 (six) hours as needed. (Patient not taking: Reported on 10/07/2016) 90 tablet 0   Current Facility-Administered Medications  Medication Dose Route Frequency Provider Last Rate Last Dose  . 0.9 %  sodium chloride infusion  500 mL Intravenous Continuous Tonya Shipper, MD        REVIEW OF SYSTEMS:  Constitutional: Denies fevers, chills or abnormal night sweats (+) no appetite (+) weight loss  (+) low energy Eyes: Denies blurriness of vision, double vision or watery eyes Ears, nose, mouth, throat, and face: Denies mucositis or sore throat Respiratory: Denies cough, dyspnea or wheezes Cardiovascular: Denies palpitation, chest discomfort or lower extremity swelling Gastrointestinal:  Denies nausea, heartburn (+) loose stool (+) ileostomy bag Skin: Denies abnormal skin rashes  Lymphatics: Denies new lymphadenopathy or easy bruising Neurological:Denies numbness, tingling or new weaknesses Behavioral/Psych: no new changes (+) overall down mood, depression All other systems were reviewed with the patient and are negative.  PHYSICAL EXAMINATION:  ECOG  PERFORMANCE STATUS: 1 - Symptomatic but completely ambulatory Vitals:   12/22/16 0819  BP: 131/67  Pulse: 87  Resp: 20  Temp: 98.4 F (36.9 C)  TempSrc: Oral  SpO2: 100%  Weight: 128 lb 3.2 oz (58.2 kg)  Height: 5' (1.524 m)    GENERAL:alert, no distress and comfortable SKIN: skin color, texture, turgor are normal, no rashes or significant lesions, Mild erythema parienal area, no skin breakdown or discharge  EYES: normal, conjunctiva are pink and non-injected, sclera clear OROPHARYNX:no exudate, no erythema and lips, buccal mucosa, and tongue normal NECK: supple, thyroid normal size, non-tender, without nodularity LYMPH:  no palpable lymphadenopathy in the cervical, axillary or inguinal LUNGS: clear to auscultation and percussion with normal breathing effort HEART: regular rate & rhythm and no murmurs and no lower extremity edema ABDOMEN:abdomen soft, non-tender, and normal bowel sounds. No organomegaly. Low abdomen (+) surgical incision healing well (+) ileostomy bag with watery stool  Musculoskeletal:no cyanosis of digits and no clubbing PSYCH: alert & oriented x 3 with fluent speech NEURO: no focal motor/sensory deficits Rectal Exam: Deferred today   LABORATORY DATA:  I have reviewed the data as listed. CBC Latest Ref Rng & Units 12/22/2016 10/07/2016 09/13/2016  WBC 3.9 - 10.3 10e3/uL 9.8 6.3 7.2  Hemoglobin 11.6 - 15.9 g/dL 10.2(L) 10.9(L) 11.7  Hematocrit 34.8 - 46.6 % 32.2(L) 33.7(L) 35.9  Platelets 145 - 400 10e3/uL 551(H) 240 258   CMP Latest Ref Rng & Units 12/22/2016 10/07/2016 09/13/2016  Glucose 70 - 140 mg/dl 116 85 96  BUN 7.0 - 26.0 mg/dL 16.5 19.5 15.7  Creatinine 0.6 - 1.1 mg/dL 0.7 0.7 0.6  Sodium 136 - 145 mEq/L 137 142 139  Potassium 3.5 - 5.1 mEq/L 3.6 3.3(L) 4.0  Chloride 96 - 112 mEq/L - - -  CO2 22 - 29 mEq/L 25 23 23   Calcium 8.4 - 10.4 mg/dL 9.5 9.0 9.3  Total Protein 6.4 - 8.3 g/dL 6.7 5.9(L) 6.0(L)  Total Bilirubin 0.20 - 1.20 mg/dL 0.22 0.26  0.36  Alkaline Phos 40 - 150 U/L 73 79 78  AST 5 - 34 U/L 18 18 16   ALT 0 - 55 U/L 18 18 14     PATHOLOGY:  FINAL PATHOLOGIC DIAGNOSIS 12/06/16 MICROSCOPIC EXAMINATION AND DIAGNOSIS  A.ANAL CANAL, MUCOSECTOMY: Benign rectal and fibroadipose tissue. Negative for malignancy.  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 Mucinous tumor focally invades through the muscularis propria into pericolorectal tissue. Radial margin interpreted as involved by invasive carcinoma (invasive acellular mucin with rare viable tumor cells focally comes to  within 0.2 mm [0.02 cm] of the inked radial soft tissue margin). Treatment effect present, predominantly invasive acellular mucin associated with only very rare small groups of viable tumor cells (near complete response). 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 AJCC Pathologic Stage: ypT3 pN1a. See Cancer Case Summary.  C.POSTERIOR ANAL CANAL, EXCISION: Benign rectal tissue. Negative for malignancy.    07/09/2016: Diagnosis Surgical [P], rectum mass - INVASIVE ADENOCARCINOMA.  RADIOGRAPHIC STUDIES: I have personally reviewed the radiological images as listed and agreed with the findings in the report. No results found.  ASSESSMENT & PLAN:  Tonya [name] is a 66 y.o. woman with past medical history of gastric bypass surgery, presented with rectal bleeding, loose bowel movement and stool incontinence for one half years, was recently diagnosed invasive rectal adenocarcinoma.  1. Invasive low rectal adenocarcinoma, cT3N0M0,  ypT3N1a, near complete response, (+) margin  -I reviewed his CT scan findings, and surgical pathology results in great details with patient and her friend.  -I discussed her abdominal and pelvic MRI scan findings, which showed a T3 N0 rectal tumor. The liver lesion is indeterminate, low possibility of metastasis. We'll continue observation, and repeat scan in 3-6 months. -Patient agrees to proceed with concurrent chemotherapy and radiation. She completed on 09/16/16. Patient has tolerated concurrent chemotherapy and radiation very well., other than rectal pain.  -She underwent surgery on 12/06/16 with Dr. Drue [name] at [facility]. The surgical path showed near complete response to neoadjuvant chemoradiation, but it was still a T3 lesion, and unfortunately the radial margin was positive and one out of 12 nodes was positive. She is stage III and moderate risk of distant recurrence and high risk of local recurrence   -I recommend adjuvant chemotherapy with CAPOX every 3 weeks or FOLFOX every 2 weeks for at least 2-3 months. I discussed the side effects in great detail. I gave reading material on these medications. Although I think she may tolerate FOLFOX better, she is very stressed about the pump , and see frequent visit for this treatment regimen.  After lengthy discussion, we decided to try Xeloda and oxaliplatin first.  I plan to reduce her oxaliplatin dose for the first cycle to see if she is able to tolerate.   --Chemotherapy consent: Side effects including but does not not limited to, fatigue, nausea, vomiting, diarrhea, hair loss, neuropathy, cold sensitivity, fluid retention, renal and kidney dysfunction, neutropenic fever, needed for blood transfusion, bleeding, were discussed with patient in great detail. She agrees to proceed.  -Pt appears to be very depressed and devastated by her slow recovery, colostomy bag etc, and has little interest in her life now. I also discussed her option to not proceed with  adjuvant treatment. But she states she will do adjuvant chemo.  -After a lengthy discussion she decided to proceed with chemo CAPOX with reduced dose oxaliplatin in 3 weeks.  -She will do chemo class in the next 1-2 weeks.  -F/u in 2 weeks    2. Iron Deficient Anemia  - The patient takes 2 iron supplements daily. - Her iron has been somewhat low. - She will stop taking aspirin to reduce her bleeding risk. -Repeat iron studies showed iron deficiency, she will receive 1 dose IV iron Feraheme today. -Her levels are improved now, Ferritin is 272 as of 08/16/2016  -Iron study for today is pending   3. Genetics  -Patient has  2 sisters who had uterine cancer, this is suspicious for lynch syndrome. -I encouraged the patient to pursue genetic testing based on her family history. This will determine if the patient has inheritable genetic syndrome, such as Lynch syndrome -The patient is in agreement and I referred her to  Genetics -She will do testing  on 10/08/16  before surgery.   4. History of gastric bypass surgery in 2003 -for obesity   5. History of DM and HTN -Resolved after her gastric bypass surgery. -We'll monitor her blood pressure and glucose closely during her chemotherapy treatment.  6. Liver lesion -Indeterminate, no high suspicion for metastatic disease, plan to repeat liver MRI next month   7. Hypokalemia  - Low and will recheck next week. - I advised her to eat foods that are high in potassium -I previously prescribed potassium chloride 20 meq daily -her potassium is 4.0 previously (09/13/16). She will continue with 1 potassium a day until next lab  -Potassium is 3.6 today (12/22/16)    8. Anorexia, Weight loss -She has loss weight since surgery as she has not been eating much  -I strongly encouraged her to take ensure boost or carnation breakfast  -I will set up a consultation with our dietitian    9. Diarrhea, loose stool  -She has imodium at home, I prescribed  lomotil to slow down her BMs   10. Depression -I discussed as this is a hard time for her post surgery she can talk to someone at our clinic. I recommend her to see our Education officer, museum for counseling, she agreed.  -I will prescribe Mirtazapine today (12/22/16) as this can also help her appetite and sleep, she can start at half dose to see if she can tolerate.    PLAN: -Prescribe Lomotil and mirtazapine today  -Lab and f/u in 2 weeks oxaliplatin infusion in 3 weeks  -Dietician consult, SW consults, and Chemo class in 2 weeks -will refill her Xeloda    No orders of the defined types were placed in this encounter.  All questions were answered. The patient knows to call the clinic with any problems, questions or concerns.  I spent 30 minutes counseling the patient face to face. The total time spent in the appointment was 40 minutes and more than 50% was on counseling.  This document serves as a record of services personally performed by Tonya Merle, MD. It was created on her behalf by Joslyn Devon, a trained medical scribe. The creation of this record is based on the scribe's personal observations and the provider's statements to them.    I have reviewed the above documentation for accuracy and completeness, and I agree with the above.      Tonya Merle, MD 12/22/2016

## 2016-12-21 DIAGNOSIS — C20 Malignant neoplasm of rectum: Secondary | ICD-10-CM | POA: Diagnosis not present

## 2016-12-21 DIAGNOSIS — Z483 Aftercare following surgery for neoplasm: Secondary | ICD-10-CM | POA: Diagnosis not present

## 2016-12-21 DIAGNOSIS — Z432 Encounter for attention to ileostomy: Secondary | ICD-10-CM | POA: Diagnosis not present

## 2016-12-22 ENCOUNTER — Ambulatory Visit (HOSPITAL_BASED_OUTPATIENT_CLINIC_OR_DEPARTMENT_OTHER): Payer: Medicare HMO | Admitting: Hematology

## 2016-12-22 ENCOUNTER — Telehealth: Payer: Self-pay | Admitting: Hematology

## 2016-12-22 ENCOUNTER — Other Ambulatory Visit (HOSPITAL_BASED_OUTPATIENT_CLINIC_OR_DEPARTMENT_OTHER): Payer: Medicare HMO

## 2016-12-22 ENCOUNTER — Encounter: Payer: Self-pay | Admitting: Hematology

## 2016-12-22 VITALS — BP 131/67 | HR 87 | Temp 98.4°F | Resp 20 | Ht 60.0 in | Wt 128.2 lb

## 2016-12-22 DIAGNOSIS — K769 Liver disease, unspecified: Secondary | ICD-10-CM | POA: Diagnosis not present

## 2016-12-22 DIAGNOSIS — R634 Abnormal weight loss: Secondary | ICD-10-CM

## 2016-12-22 DIAGNOSIS — F329 Major depressive disorder, single episode, unspecified: Secondary | ICD-10-CM | POA: Diagnosis not present

## 2016-12-22 DIAGNOSIS — D500 Iron deficiency anemia secondary to blood loss (chronic): Secondary | ICD-10-CM | POA: Diagnosis not present

## 2016-12-22 DIAGNOSIS — R69 Illness, unspecified: Secondary | ICD-10-CM | POA: Diagnosis not present

## 2016-12-22 DIAGNOSIS — E876 Hypokalemia: Secondary | ICD-10-CM

## 2016-12-22 DIAGNOSIS — C20 Malignant neoplasm of rectum: Secondary | ICD-10-CM | POA: Diagnosis not present

## 2016-12-22 DIAGNOSIS — Z932 Ileostomy status: Secondary | ICD-10-CM | POA: Diagnosis not present

## 2016-12-22 DIAGNOSIS — R630 Anorexia: Secondary | ICD-10-CM | POA: Diagnosis not present

## 2016-12-22 DIAGNOSIS — R197 Diarrhea, unspecified: Secondary | ICD-10-CM

## 2016-12-22 LAB — CBC & DIFF AND RETIC
BASO%: 0.6 % (ref 0.0–2.0)
BASOS ABS: 0.1 10*3/uL (ref 0.0–0.1)
EOS%: 2.1 % (ref 0.0–7.0)
Eosinophils Absolute: 0.2 10*3/uL (ref 0.0–0.5)
HEMATOCRIT: 32.2 % — AB (ref 34.8–46.6)
HGB: 10.2 g/dL — ABNORMAL LOW (ref 11.6–15.9)
Immature Retic Fract: 24.6 % — ABNORMAL HIGH (ref 1.60–10.00)
LYMPH%: 4.6 % — ABNORMAL LOW (ref 14.0–49.7)
MCH: 30.4 pg (ref 25.1–34.0)
MCHC: 31.7 g/dL (ref 31.5–36.0)
MCV: 95.8 fL (ref 79.5–101.0)
MONO#: 0.7 10*3/uL (ref 0.1–0.9)
MONO%: 6.9 % (ref 0.0–14.0)
NEUT#: 8.4 10*3/uL — ABNORMAL HIGH (ref 1.5–6.5)
NEUT%: 85.8 % — AB (ref 38.4–76.8)
PLATELETS: 551 10*3/uL — AB (ref 145–400)
RBC: 3.36 10*6/uL — AB (ref 3.70–5.45)
RDW: 12.4 % (ref 11.2–14.5)
RETIC %: 3.58 % — AB (ref 0.70–2.10)
RETIC CT ABS: 120.29 10*3/uL — AB (ref 33.70–90.70)
WBC: 9.8 10*3/uL (ref 3.9–10.3)
lymph#: 0.5 10*3/uL — ABNORMAL LOW (ref 0.9–3.3)

## 2016-12-22 LAB — COMPREHENSIVE METABOLIC PANEL
ALT: 18 U/L (ref 0–55)
ANION GAP: 9 meq/L (ref 3–11)
AST: 18 U/L (ref 5–34)
Albumin: 3.1 g/dL — ABNORMAL LOW (ref 3.5–5.0)
Alkaline Phosphatase: 73 U/L (ref 40–150)
BUN: 16.5 mg/dL (ref 7.0–26.0)
CALCIUM: 9.5 mg/dL (ref 8.4–10.4)
CHLORIDE: 103 meq/L (ref 98–109)
CO2: 25 meq/L (ref 22–29)
Creatinine: 0.7 mg/dL (ref 0.6–1.1)
Glucose: 116 mg/dl (ref 70–140)
POTASSIUM: 3.6 meq/L (ref 3.5–5.1)
Sodium: 137 mEq/L (ref 136–145)
Total Bilirubin: 0.22 mg/dL (ref 0.20–1.20)
Total Protein: 6.7 g/dL (ref 6.4–8.3)

## 2016-12-22 LAB — IRON AND TIBC
%SAT: 30 % (ref 21–57)
IRON: 90 ug/dL (ref 41–142)
TIBC: 304 ug/dL (ref 236–444)
UIBC: 214 ug/dL (ref 120–384)

## 2016-12-22 LAB — FERRITIN: FERRITIN: 85 ng/mL (ref 9–269)

## 2016-12-22 MED ORDER — CAPECITABINE 500 MG PO TABS: 1000.0000 mg/m2 | ORAL_TABLET | Freq: Two times a day (BID) | ORAL | 2 refills | Status: DC

## 2016-12-22 MED ORDER — MIRTAZAPINE 15 MG PO TABS: 15.0000 mg | ORAL_TABLET | Freq: Every day | ORAL | 1 refills | Status: DC

## 2016-12-22 MED ORDER — DIPHENOXYLATE-ATROPINE 2.5-0.025 MG PO TABS: 1.0000 | ORAL_TABLET | Freq: Four times a day (QID) | ORAL | 0 refills | Status: DC | PRN

## 2016-12-22 NOTE — Progress Notes (Signed)
START ON PATHWAY REGIMEN - Colorectal     A cycle is every 21 days:     Capecitabine      Oxaliplatin   **Always confirm dose/schedule in your pharmacy ordering system**    Patient Characteristics: Rectal Neoadjuvant/Adjuvant, T3 - T4, N0 or Any T, N+, Postoperative - Had Neoadjuvant Therapy - Preoperative Node Positive Current evidence of distant metastases<= No AJCC T Category: T3 AJCC N Category: N1a AJCC M Category: M0 AJCC 8 Stage Grouping: IIIB Intent of Therapy: Curative Intent, Discussed with Patient

## 2016-12-22 NOTE — Telephone Encounter (Signed)
Gave avs and calendar for January. Patient did not want to schedule sw

## 2016-12-23 ENCOUNTER — Encounter: Payer: Self-pay | Admitting: Pharmacist

## 2016-12-23 ENCOUNTER — Telehealth: Payer: Self-pay | Admitting: Pharmacist

## 2016-12-23 DIAGNOSIS — C20 Malignant neoplasm of rectum: Secondary | ICD-10-CM

## 2016-12-23 MED ORDER — CAPECITABINE 500 MG PO TABS: 1000.0000 mg/m2 | ORAL_TABLET | Freq: Two times a day (BID) | ORAL | 2 refills | Status: DC

## 2016-12-23 NOTE — Telephone Encounter (Signed)
Oral Oncology Pharmacist Encounter  Received new prescription for Xeloda (capecitabine) for the adjuvant treatment of colon cancer in conjunction with infusional oxaliplatin, planned duration 3-6 months.  Labs from 12/22/16 assessed, OK for treatment.  Current medication list in Epic reviewed, no DDIs with Xeloda or oxaliplatin identified.  Prescription has been e-scribed to the [facility] for benefits analysis and approval. Test claim revealed copayment of $144.80, patient does have existing copayment grant. This will be discussed with patient.  Oral Oncology Clinic will continue to follow for initial counseling and start date.  Johny Drilling, PharmD, BCPS, BCOP 12/23/2016 2:57 PM Oral Oncology Clinic [phone]

## 2016-12-23 NOTE — Telephone Encounter (Signed)
Oral Chemotherapy Pharmacist Encounter   Attempted to reach patient for follow up on oral medication: Xeloda. No answer. Left VM for patient to call back with any questions or issues.   Thank you,  Johny Drilling, PharmD, BCPS, BCOP 12/23/2016  3:14 PM  Oral Oncology Clinic [phone]

## 2016-12-24 ENCOUNTER — Encounter: Payer: Self-pay | Admitting: General Practice

## 2016-12-24 ENCOUNTER — Telehealth: Payer: Self-pay | Admitting: Hematology

## 2016-12-24 DIAGNOSIS — C20 Malignant neoplasm of rectum: Secondary | ICD-10-CM | POA: Diagnosis not present

## 2016-12-24 DIAGNOSIS — Z483 Aftercare following surgery for neoplasm: Secondary | ICD-10-CM | POA: Diagnosis not present

## 2016-12-24 DIAGNOSIS — Z432 Encounter for attention to ileostomy: Secondary | ICD-10-CM | POA: Diagnosis not present

## 2016-12-24 NOTE — Progress Notes (Signed)
[facility] CSW Progress Note  CSW received referral from Dr Burr Medico re patient needing additional support and counseling related to adjustment to cancer diagnosis and treatment.  CSW unable to reach patient, left VM requesting call back to discuss needs for counseling and support.  CSW will await return call from patient.  Edwyna Shell, LCSW Clinical Social Worker Phone:  [phone]

## 2016-12-24 NOTE — Progress Notes (Signed)
Tonya [name] CSW Progress Note  Patient returned call.  "I was so blown away by the diagnosis of cancer, I didn't know what questions to ask.  I really just need to see the doctor to get some answers."  Is unsure whether she wants to proceed w treatment, "I have seen people take chemo, suffer and die anyhow."  Wants to discuss w MD pros/cons of treatment and "then I can make my own decision about how to proceed."  Has contacted scheduler to schedule appt w MD. CSW offered services of social workers and chaplain at [facility] to assist in processing information and providing support.  Per patient "I am not interested in therapy", wants to get information, discuss w friends and make her own decision re course of treatment.  CSW encouraged patient to reach out to [facility] as needed, provided contact information.  CSW signing off at this time as patient is not interested in services, will stand by to be reconsulted as needed.  Edwyna Shell, LCSW Clinical Social Worker Phone:  [phone]

## 2016-12-24 NOTE — Telephone Encounter (Signed)
Patient called in and request an appt to be scheduled next week to see a Provider about chemo questions - per NP Lacie unable to add patient in next week and she or Rn navigator will call the patient in regards to her questions. -  Patient is aware.

## 2016-12-24 NOTE — Telephone Encounter (Signed)
Oral Chemotherapy Pharmacist Encounter   I spoke with patient for overview of: Xeloda and CapeOx regimen.   Counseled patient on administration, dosing, side effects, monitoring, drug-food interactions, safe handling, storage, and disposal.  Patient will take Xeloda 500mg  tablets, 3 tablets (1500mg ) by mouth in AM and 3 tabs (1500mg ) by mouth in PM, within 30 minutes of finishing meals, on days 1-14 of each 21 day cycle.  Oxaliplatin will be infused on day 1 of each 21 day cycle. Xeloda and oxaliplatin start date: not yet scheduled (planned early January)  Side effects of Xeloda include but not limited to: fatigue, decreased blood counts, GI upset, diarrhea, and hand-foot syndrome. Patient has loperamide at home and will call the office if diarrhea develops.    Reviewed with patient importance of keeping a medication schedule and plan for any missed doses.  Ms. Tonya [name] voiced understanding and appreciation.   Patient with multiple questions about side effects of oxaliplatin and the rates of recurrence with various treatment strategies, including no treatment. Discussed indication for oxaliplatin All questions answered. Medication reconciliation performed and medication/allergy list updated.  Will follow up with patient regarding insurance and pharmacy.   Patient knows to call the office with questions or concerns. Oral Oncology Clinic will continue to follow.  Thank you,  Johny Drilling, PharmD, BCPS, BCOP 12/24/2016   12:01 PM Oral Oncology Clinic [phone]

## 2016-12-27 DIAGNOSIS — Z432 Encounter for attention to ileostomy: Secondary | ICD-10-CM | POA: Diagnosis not present

## 2016-12-27 DIAGNOSIS — C20 Malignant neoplasm of rectum: Secondary | ICD-10-CM | POA: Diagnosis not present

## 2016-12-27 DIAGNOSIS — Z932 Ileostomy status: Secondary | ICD-10-CM | POA: Diagnosis not present

## 2016-12-27 DIAGNOSIS — Z483 Aftercare following surgery for neoplasm: Secondary | ICD-10-CM | POA: Diagnosis not present

## 2016-12-29 ENCOUNTER — Telehealth: Payer: Self-pay | Admitting: Pharmacist

## 2016-12-29 NOTE — Telephone Encounter (Signed)
Oral Oncology Pharmacist Encounter  Received call from patient and friend, Alvester Chou, with further questions about CapeOx regimen.  Patient states she is starting to feel stronger physically, but is not doing well mentally.  Patient with multiple questions about prognosis, where her disease has spread, and mortality rates with prescribed chemotherapy regimen. She is very distressed about the stage of her disease and the possibility that cancer cells are in distant sites in her body.  I was able to address some of patient's concerns, however, many of her questions will be answered during chemotherapy education class (on 01/05/17) or are more appropriate to address with Dr. Burr Medico. Patient will discuss her concerns at office visit scheduled 01/05/17. Juliann Pulse plans to attend this office visit as well.  Oral Oncology Clinic will continue to follow.  Johny Drilling, PharmD, BCPS, BCOP 12/29/2016 9:41 AM Oral Oncology Clinic [phone]

## 2016-12-30 ENCOUNTER — Encounter: Payer: Self-pay | Admitting: *Deleted

## 2016-12-31 DIAGNOSIS — C20 Malignant neoplasm of rectum: Secondary | ICD-10-CM | POA: Diagnosis not present

## 2016-12-31 DIAGNOSIS — Z432 Encounter for attention to ileostomy: Secondary | ICD-10-CM | POA: Diagnosis not present

## 2016-12-31 DIAGNOSIS — Z483 Aftercare following surgery for neoplasm: Secondary | ICD-10-CM | POA: Diagnosis not present

## 2016-12-31 NOTE — Progress Notes (Signed)
[facility]  [phone] [phone]  Clinic Follow Up Note   Patient Care Team: Lucille Passy, MD as PCP - General (Family Medicine) Irene Shipper, MD as Consulting Physician (Gastroenterology) Leighton Ruff, MD as Consulting Physician (General Surgery) Truitt Merle, MD as Consulting Physician (Hematology) Kyung Rudd, MD as Consulting Physician (Radiation Oncology)   Date of Service:  01/05/2017  CHIEF COMPLAINTS:  Follow up rectal Adenocarcinoma  Oncology History   Cancer Staging Rectal adenocarcinoma [facility]) Staging form: Colon and Rectum, AJCC 8th Edition - Clinical stage from 07/09/2016: Stage IIA (cT3, cN0, cM0) - Signed by Truitt Merle, MD on 08/16/2016       Rectal adenocarcinoma ([place])   07/09/2016 Pathology Results    Diagnosis Surgical [P], rectum mass - INVASIVE ADENOCARCINOMA. - SEE COMMENT.      07/09/2016 Procedure    Colonoscopy A non-obstructing large friable mass was found in the distal rectum. This began at approximately 1 cm above the anal verge and extended proximal for a distance of 8-10 cm. The lesion was bulky and involved approximately 75% of the luminal circumference. Multiple biopsies were taken. A few small-mouthed diverticula were found in the left colon. The exam was otherwise without abnormality on direct views. Retroflexion not performed purposely.      07/12/2016 Imaging    CT C/A/P with contrast IMPRESSION: Rectal wall thickening/mass, compatible with known rectal adenocarcinoma. No findings specific for metastatic disease in the chest, abdomen, or pelvis. 2.2 cm probable hemangioma in segment 4B, although poorly evaluated. Consider MRI abdomen with/ without multihance contrast for definitive characterization, as clinically warranted. Bilateral adrenal nodules measuring up to 1.6 cm on the right, likely reflecting benign adrenal adenomas, although technically indeterminate. If MRI abdomen is performed, these can be  definitively characterized at that time.      07/20/2016 Initial Diagnosis    Rectal adenocarcinoma ([employer])      08/03/2016 Imaging    MRI AP W WO Contrast 08/03/16 IMPRESSION: 1. Large rectal mass measures 6.1 cm in length. No obstruction identified. This is compatible with at least a T3bN0M0 lesion. 2. No specific features highly suspicious for nodal metastasis or metastatic disease to the upper abdomen. 3. Indeterminate arterial phase enhancing lesion within the anterior dome of liver measures less than 1 cm. This may represent a benign liver lesions such as FNH or adenoma. Less favored with the hypervascular liver metastasis. Followup imaging at 6 months with repeat MRI of the liver is advise. 4. Bilateral adrenal adenomas      08/04/2016 - 09/16/2016 Radiation Therapy    Concurrent chemo radiation with Dr. [name]      08/04/2016 - 09/16/2016 Chemotherapy    Concurrent chemo radiation with Xeloda, 1500mg  twice daily      12/06/2016 Surgery    ULTRA LOW ANTERIOR RESECTION OF SIGMOID COLON AND RECTUM WITH COLOANAL ANASTAMOSIS AND LOOP ILEOSTOMY  by Dr. Drue Flirt at [facility]  12/06/16      12/06/2016 Pathology Results    FINAL PATHOLOGIC DIAGNOSIS 12/06/16 MICROSCOPIC EXAMINATION AND DIAGNOSIS  A.ANAL CANAL, MUCOSECTOMY: Benign rectal and fibroadipose tissue. Negative for malignancy.  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 Mucinous tumor focally invades through the muscularis propria into pericolorectal tissue. Radial margin interpreted as involved by invasive carcinoma (invasive acellular mucin with rare viable tumor cells focally comes to within 0.2 mm [0.02 cm] of the inked radial soft tissue  margin). Treatment effect present, predominantly  invasive acellular mucin associated with only very rare small groups of viable tumor cells (near complete response). 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 AJCC Pathologic Stage: ypT3 pN1a. See Cancer Case Summary.  C.POSTERIOR ANAL CANAL, EXCISION: Benign rectal tissue. Negative for malignancy.       Chemotherapy     Xeldoa 1500mg  BID for 2 weeks on and 1 week off on 01/10/17 and start oxaliplatin every 3 weeks with cycle 2.         HISTORY OF PRESENTING ILLNESS: 07/20/16 [name] 66 y.o. female is here because of recently discovered invasive adenocarcinoma. She is accompanied by a friend today. The patient underwent colonoscopy on 07/09/16 with Dr. [name]. According to Dr. Blanch Media note from that encounter, the patient presented with complaints of rectal bleeding and iron deficiency anemia. She reports she was told she has hemorrhoids and initially was not concerned about the rectal bleeding. Her most recent prior colonoscopy was 2005. On exam, Dr. Henrene Pastor noted a non-obstructing large friable mass was found in the distal rectum. This began at approximately 1 cm above the anal verge and extended proximal for a distance of 8-10 cm. The lesion was bulky and involved approximately 75% of the luminal circumference. Biopsy of the rectal mass on 07/09/16 showed invasive adenocarcinoma.  CT C/A/P on 07/12/16 revealed rectal wall thickening/mass, compatible with known rectal adenocarcinoma.No findings specific for metastatic disease in the chest, abdomen, or pelvis. 2.2 cm probable hemangioma in segment 4B, although poorly evaluated. Consider MRI abdomen with/ without multihance contrast for definitive characterization, as clinically warranted. Bilateral adrenal nodules measuring up to 1.6 cm  on the right, likely reflecting benign adrenal adenomas, although technically indeterminate. If MRI abdomen is performed, these can be definitively characterized at that time.  On 07/19/16 the patient consulted with Dr. Leighton Ruff of [facility]. Per her note, she recommends complete metastatic workup with MRI to evaluate liver, adrenal gland, and rectum. The patient will follow up with Dr. Marcello Moores following neoadjuvant therapy to discuss surgical resection.  The patient reports she has lost 30 lbs since January. She reports ongoing fatigue, which her PCP related to iron deficiency and vitamin D deficiency. She reports occasional incontinence, and irregular bowel activity. She reports ongoing bright red rectal bleeding. She denies cramps or pain. She takes 2 iron pills daily.  CURRENT THERAPY: pending CAPOX vs FOLFOX, starting in 1-2 weeks   INTERVAL HISTORY:  [name] is here for a follow-up. She presents to the clinic today accompanied by her friend Juliann Pulse. She notes other than her ileostomy bag is causing her issues. She notes to emptying it several times a day and then she has gas which can cause a mess when changing a bag. She is willing to increase her lomotils. She reports to drinking water enough but does not like her diet change. She is taking 1 ensure a day. She wonders if she is fine to do any further treatment. She thinks she may have to wait surgery for a year and that significantly worries her.     MEDICAL HISTORY:  Past Medical History:  Diagnosis Date  . Coronary artery disease 07/2003  . Myocardial infarction ([place])   . Obesity 2003   s/p gastric bypass     SURGICAL HISTORY: Past Surgical History:  Procedure Laterality Date  . CHOLECYSTECTOMY  1999  . GASTRIC BYPASS  2003    SOCIAL HISTORY: Social History   Socioeconomic History  . Marital status: Single    Spouse name: Not on file  . Number of children: 0  . Years of education: Not on  file  . Highest education level: Not on file  Social Needs  . Financial resource strain: Not on file  . Food insecurity - worry: Not on file  . Food insecurity - inability: Not on file  . Transportation needs - medical: Not on file  . Transportation needs - non-medical: Not on file  Occupational History  . Occupation: retired  Tobacco Use  . Smoking status: Never Smoker  . Smokeless tobacco: Never Used  Substance and Sexual Activity  . Alcohol use: No  . Drug use: No  . Sexual activity: Not on file  Other Topics Concern  . Not on file  Social History Narrative  . Not on file    FAMILY HISTORY: Family History  Problem Relation Age of Onset  . Heart attack Mother        d.93  . Throat cancer Mother 51  . Clotting disorder Mother   . Liver disease Mother   . Kidney disease Mother   . Heart attack Father        d.92  . Prostate cancer Father 77  . Uterine cancer Sister 36       treated with total hysterectomy and radiation  . Thyroid cancer Sister 66       treated with thyroidectomy  . Cancer Maternal Uncle        d.80s unspecified type of cancer. History of smoking.  Marland Kitchen Uterine cancer Sister 74  . Colon cancer Cousin 34       paternal first-cousin    ALLERGIES:  is allergic to bactrim [sulfamethoxazole-trimethoprim]; phenergan [promethazine hcl]; sulfa antibiotics; and penicillins.  MEDICATIONS:  Current Outpatient Medications  Medication Sig Dispense Refill  . capecitabine (XELODA) 500 MG tablet Take 3 tablets (1,500 mg total) by mouth 2 (two) times daily after a meal. Take for 14 days on, 7 days off, repeat every 21 days. 84 tablet 2  . cholecalciferol (VITAMIN D) 1000 UNITS tablet Take 1,000 Units by mouth daily.      . diphenoxylate-atropine (LOMOTIL) 2.5-0.025 MG tablet Take 1-2 tablets by mouth 4 (four) times daily as needed for diarrhea or loose stools. 60 tablet 0  . ferrous sulfate (FERROUSUL) 325 (65 FE) MG tablet Take 1 tablet (325 mg total) by mouth daily  with breakfast. (Patient taking differently: Take 325 mg by mouth. Take two by mouth daily) 30 tablet 6  . ibuprofen (ADVIL,MOTRIN) 200 MG tablet Take 200 mg by mouth every 6 (six) hours as needed.    . Ibuprofen-Diphenhydramine Cit (ADVIL PM) 200-38 MG TABS Take 1 tablet by mouth every 5 (five) hours as needed.     . mirtazapine (REMERON) 15 MG tablet Take 1 tablet (15 mg total) by mouth at bedtime. 30 tablet 1  . Multiple Vitamin (MULTIVITAMIN) tablet Take 1 tablet by mouth daily.    . ondansetron (ZOFRAN) 8 MG tablet Take 1 tablet (8 mg total) by mouth every 8 (eight) hours as needed for nausea or vomiting. (Patient not taking: Reported on 10/07/2016) 20 tablet 1  . potassium chloride SA (K-DUR,KLOR-CON) 20 MEQ tablet Take 1 tablet (20 mEq total) by mouth 2 (two) times daily. For 5 days Then take once a day 40 tablet 1  . prochlorperazine (COMPAZINE) 10  MG tablet Take 1 tablet (10 mg total) by mouth every 6 (six) hours as needed for nausea or vomiting. (Patient not taking: Reported on 10/07/2016) 30 tablet 1  . traMADol (ULTRAM) 50 MG tablet Take 1 tablet (50 mg total) by mouth every 6 (six) hours as needed. (Patient not taking: Reported on 10/07/2016) 90 tablet 0   Current Facility-Administered Medications  Medication Dose Route Frequency Provider Last Rate Last Dose  . 0.9 %  sodium chloride infusion  500 mL Intravenous Continuous Irene Shipper, MD        REVIEW OF SYSTEMS:  Constitutional: Denies fevers, chills or abnormal night sweats (+) no appetite, with change in diet (+) slight weight loss  (+) low energy Eyes: Denies blurriness of vision, double vision or watery eyes Ears, nose, mouth, throat, and face: Denies mucositis or sore throat Respiratory: Denies cough, dyspnea or wheezes Cardiovascular: Denies palpitation, chest discomfort or lower extremity swelling Gastrointestinal:  Denies nausea, heartburn (+) very loose stool output (+) ileostomy bag Skin: Denies abnormal skin rashes    Lymphatics: Denies new lymphadenopathy or easy bruising Neurological:Denies numbness, tingling or new weaknesses Behavioral/Psych: no new changes (+) overall down mood, depression All other systems were reviewed with the patient and are negative.  PHYSICAL EXAMINATION:  ECOG PERFORMANCE STATUS: 1 - Symptomatic but completely ambulatory Vitals:   01/05/17 1151  BP: (!) 128/93  Pulse: 93  Resp: 18  Temp: 97.9 F (36.6 C)  TempSrc: Oral  SpO2: 100%  Weight: 126 lb 9.6 oz (57.4 kg)  Height: 5' (1.524 m)    GENERAL:alert, no distress and comfortable SKIN: skin color, texture, turgor are normal, no rashes or significant lesions, Mild erythema parienal area, no skin breakdown or discharge  EYES: normal, conjunctiva are pink and non-injected, sclera clear OROPHARYNX:no exudate, no erythema and lips, buccal mucosa, and tongue normal NECK: supple, thyroid normal size, non-tender, without nodularity LYMPH:  no palpable lymphadenopathy in the cervical, axillary or inguinal LUNGS: clear to auscultation and percussion with normal breathing effort HEART: regular rate & rhythm and no murmurs and no lower extremity edema ABDOMEN:abdomen soft, non-tender, and normal bowel sounds. No organomegaly. Low abdomen (+) surgical incision healing well (+) ileostomy bag with watery stool  Musculoskeletal:no cyanosis of digits and no clubbing PSYCH: alert & oriented x 3 with fluent speech NEURO: no focal motor/sensory deficits Rectal Exam: Deferred today   LABORATORY DATA:  I have reviewed the data as listed. CBC Latest Ref Rng & Units 01/05/2017 12/22/2016 10/07/2016  WBC 3.9 - 10.3 10e3/uL 7.1 9.8 6.3  Hemoglobin 11.6 - 15.9 g/dL 11.8 10.2(L) 10.9(L)  Hematocrit 34.8 - 46.6 % 36.6 32.2(L) 33.7(L)  Platelets 145 - 400 10e3/uL 348 551(H) 240   CMP Latest Ref Rng & Units 01/05/2017 12/22/2016 10/07/2016  Glucose 70 - 140 mg/dl 102 116 85  BUN 7.0 - 26.0 mg/dL 22.5 16.5 19.5  Creatinine 0.6 - 1.1 mg/dL 0.8  0.7 0.7  Sodium 136 - 145 mEq/L 138 137 142  Potassium 3.5 - 5.1 mEq/L 4.4 3.6 3.3(L)  Chloride 96 - 112 mEq/L - - -  CO2 22 - 29 mEq/L 25 25 23   Calcium 8.4 - 10.4 mg/dL 9.6 9.5 9.0  Total Protein 6.4 - 8.3 g/dL 6.9 6.7 5.9(L)  Total Bilirubin 0.20 - 1.20 mg/dL 0.25 0.22 0.26  Alkaline Phos 40 - 150 U/L 76 73 79  AST 5 - 34 U/L 18 18 18   ALT 0 - 55 U/L 17 18 18  PATHOLOGY:  FINAL PATHOLOGIC DIAGNOSIS 12/06/16 MICROSCOPIC EXAMINATION AND DIAGNOSIS  A.ANAL CANAL, MUCOSECTOMY: Benign rectal and fibroadipose tissue. Negative for malignancy.  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 Mucinous tumor focally invades through the muscularis propria into pericolorectal tissue. Radial margin interpreted as involved by invasive carcinoma (invasive acellular mucin with rare viable tumor cells focally comes to within 0.2 mm [0.02 cm] of the inked radial soft tissue margin). Treatment effect present, predominantly invasive acellular mucin associated with only very rare small groups of viable tumor cells (near complete response). 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 AJCC Pathologic Stage: ypT3 pN1a. See Cancer Case Summary.  C.POSTERIOR ANAL CANAL, EXCISION: Benign rectal tissue. Negative for malignancy.    07/09/2016: Diagnosis Surgical [P], rectum mass - INVASIVE ADENOCARCINOMA.  RADIOGRAPHIC STUDIES: I have personally reviewed the radiological images as listed and agreed with the findings in the report. No results found.  ASSESSMENT & PLAN:  Tonya [name] is a 66 y.o. woman with past medical history of gastric bypass surgery, presented with rectal bleeding, loose bowel movement and stool incontinence for one half years, was recently diagnosed invasive rectal adenocarcinoma.  1. Invasive low rectal adenocarcinoma, cT3N0M0, ypT3N1a, near complete response, (+) margin  -I reviewed his CT scan findings, and surgical pathology results in great details with patient and her friend.  -I discussed her abdominal and pelvic MRI scan findings, which showed a T3 N0 rectal tumor. The liver lesion is indeterminate, low possibility of metastasis. We'll continue observation, and repeat scan in 3-6 months. -Patient agrees to proceed with concurrent chemotherapy and radiation. She completed on 09/16/16. Patient has tolerated concurrent chemotherapy and radiation very well., other than rectal pain.  -She underwent surgery on 12/06/16 with Dr. Drue Flirt at [facility]. The surgical path showed near complete response to neoadjuvant chemoradiation, but it was still a T3 lesion, and unfortunately the radial margin was positive and one out of 12 nodes was positive. She is stage III and moderate risk of distant recurrence and high risk of local recurrence   -I recommend adjuvant chemotherapy with CAPOX every 3 weeks or FOLFOX every 2 weeks for at least 2-3 months. I discussed the side effects in great detail. I gave reading material on these medications. Although I think she may tolerate FOLFOX better, she is very stressed about the pump, and see frequent visit for this treatment regimen.  After lengthy discussion, we decided to try Xeloda and oxaliplatin first.  I plan to reduce her oxaliplatin dose for the first cycle to see if she is able to tolerate. Side effects were discussed with patient in great detail. She agrees to proceed.  -Pt appears to be very depressed and devastated by her slow recovery, colostomy bag etc, and has little interest in her life now. I also discussed  her option to not proceed with adjuvant treatment. But she states she will do adjuvant chemo.  -After a lengthy discussion she previously decided to proceed with chemo CAPOX, however she is very concerned about side effects.  We reviewed the other option of FOLFOX again, and I encouraged her to consider FOLFOX which she would likely tolerate better.  She is more open to FOLFOX now, and will make a decision in the next few days. --Chemotherapy consent: Side effects including but does not  not limited to, fatigue, nausea, vomiting, diarrhea, hair loss, neuropathy, fluid retention, renal and kidney dysfunction, neutropenic fever, needed for blood transfusion, bleeding, coronary artery spasm, heart attack, and heart failure were discussed with patient in great detail. She agrees to proceed. -I will arrange port placement and first cycle FOLFOX if she decides to do   2. Iron Deficient Anemia  - The patient takes 2 iron supplements daily. - Her iron has been somewhat low. - She will stop taking aspirin to reduce her bleeding risk. -Repeat iron studies showed iron deficiency, she will receive 1 dose IV iron Feraheme today. -Her levels are improved now, Ferritin is 272 as of 08/16/2016  -HG normal at 11.8 (01/05/17). Iron study for today is pending   3. Genetics  -Patient has 2 sisters who had uterine cancer, this is suspicious for lynch syndrome. -I encouraged the patient to pursue genetic testing based on her family history. This will determine if the patient has inheritable genetic syndrome, such as Lynch syndrome -The patient is in agreement and I referred her to  Genetics but she has not been seen    4. History of gastric bypass surgery in 2003 -for obesity   5. History of DM and HTN -Resolved after her gastric bypass surgery. -We'll monitor her blood pressure and glucose closely during her chemotherapy treatment.  6. Liver lesion -Indeterminate, no high suspicion for metastatic disease, plan to  repeat liver MRI next month   7. Hypokalemia  - Low and will recheck next week. - I advised her to eat foods that are high in potassium -I previously prescribed potassium chloride 20 meq daily -her potassium is 4.0 previously (09/13/16). She will continue with 1 potassium a day until next lab  -Potassium is 3.6 (12/22/16)  -Potassium at 4.4 (01/05/17)  8. Anorexia, Weight loss -She has loss weight since surgery as she has not been eating much  -I strongly encouraged her to take ensure boost or carnation breakfast  -I will set up a consultation with our dietitian  -Her weight loss has slowed down. I advised her to increase her ensure boost to 2 a day.    9. Diarrhea, loose stool  -She has imodium at home, I prescribed lomotil to slow down her BMs -I advised her to increase her lomotil up to 6-8 times a day as needed.    10. Depression -I discussed as this is a hard time for her post surgery she can talk to someone at our clinic. I recommend her to see our Education officer, museum for counseling, she agreed.  -I prescribed Mirtazapine on (12/22/16) as this can also help her appetite and sleep, she can start at half dose to see if she can tolerate.    PLAN: -I advised her to increased her lomotil per day -she will think about FOLFOX, if she agrees I will arrange port placement by IR and first cycle chemo within two weeks  -f/u with second cycle FOLFOX     No orders of the defined types were placed in this encounter.  All questions were answered. The patient knows to call the clinic with any problems, questions or concerns.  I spent 25 minutes counseling the patient face to face. The total time spent in the appointment was 30 minutes and more than 50% was on counseling.  This document serves as a record of services personally performed by Truitt Merle, MD. It was created on her behalf by Joslyn Devon, a trained medical scribe. The creation of  this record is based on the scribe's personal  observations and the provider's statements to them.    I have reviewed the above documentation for accuracy and completeness, and I agree with the above.      Truitt Merle, MD 01/05/2017

## 2017-01-03 ENCOUNTER — Telehealth: Payer: Self-pay | Admitting: Pharmacy Technician

## 2017-01-03 DIAGNOSIS — R69 Illness, unspecified: Secondary | ICD-10-CM | POA: Diagnosis not present

## 2017-01-03 NOTE — Telephone Encounter (Signed)
Oral Oncology Patient Advocate Encounter  Was successful in securing patient a $10,000 grant from [facility] to provide copayment coverage for Xeloda. This will keep the out of pocket expense at $0.   I have spoken with the patient..   The billing information is as follows and has been shared with [place].   Member ID: [id number] Group ID:  [id number] RxBin: [id number] Dates of Eligibility: 12/04/2016 through 12/03/2017  Fabio Asa. Melynda Keller, [place] Patient [place] [phone] 01/03/2017 11:28 AM

## 2017-01-05 ENCOUNTER — Other Ambulatory Visit (HOSPITAL_BASED_OUTPATIENT_CLINIC_OR_DEPARTMENT_OTHER): Payer: Medicare HMO

## 2017-01-05 ENCOUNTER — Other Ambulatory Visit: Payer: Medicare HMO

## 2017-01-05 ENCOUNTER — Ambulatory Visit (HOSPITAL_BASED_OUTPATIENT_CLINIC_OR_DEPARTMENT_OTHER): Payer: Medicare HMO | Admitting: Hematology

## 2017-01-05 ENCOUNTER — Encounter: Payer: Self-pay | Admitting: *Deleted

## 2017-01-05 ENCOUNTER — Encounter: Payer: Self-pay | Admitting: Nutrition

## 2017-01-05 ENCOUNTER — Encounter: Payer: Self-pay | Admitting: Hematology

## 2017-01-05 ENCOUNTER — Encounter: Payer: Medicare HMO | Admitting: Nutrition

## 2017-01-05 VITALS — BP 128/93 | HR 93 | Temp 97.9°F | Resp 18 | Ht 60.0 in | Wt 126.6 lb

## 2017-01-05 DIAGNOSIS — K909 Intestinal malabsorption, unspecified: Secondary | ICD-10-CM

## 2017-01-05 DIAGNOSIS — C20 Malignant neoplasm of rectum: Secondary | ICD-10-CM

## 2017-01-05 DIAGNOSIS — R634 Abnormal weight loss: Secondary | ICD-10-CM

## 2017-01-05 DIAGNOSIS — K769 Liver disease, unspecified: Secondary | ICD-10-CM

## 2017-01-05 DIAGNOSIS — E876 Hypokalemia: Secondary | ICD-10-CM

## 2017-01-05 DIAGNOSIS — R630 Anorexia: Secondary | ICD-10-CM | POA: Diagnosis not present

## 2017-01-05 DIAGNOSIS — R197 Diarrhea, unspecified: Secondary | ICD-10-CM | POA: Diagnosis not present

## 2017-01-05 DIAGNOSIS — D500 Iron deficiency anemia secondary to blood loss (chronic): Secondary | ICD-10-CM

## 2017-01-05 DIAGNOSIS — F329 Major depressive disorder, single episode, unspecified: Secondary | ICD-10-CM

## 2017-01-05 DIAGNOSIS — R69 Illness, unspecified: Secondary | ICD-10-CM | POA: Diagnosis not present

## 2017-01-05 LAB — CBC & DIFF AND RETIC
BASO%: 0.4 % (ref 0.0–2.0)
BASOS ABS: 0 10*3/uL (ref 0.0–0.1)
EOS%: 4.4 % (ref 0.0–7.0)
Eosinophils Absolute: 0.3 10*3/uL (ref 0.0–0.5)
HEMATOCRIT: 36.6 % (ref 34.8–46.6)
HGB: 11.8 g/dL (ref 11.6–15.9)
IMMATURE RETIC FRACT: 7.7 % (ref 1.60–10.00)
LYMPH%: 6.3 % — ABNORMAL LOW (ref 14.0–49.7)
MCH: 29.6 pg (ref 25.1–34.0)
MCHC: 32.2 g/dL (ref 31.5–36.0)
MCV: 92 fL (ref 79.5–101.0)
MONO#: 0.6 10*3/uL (ref 0.1–0.9)
MONO%: 8.3 % (ref 0.0–14.0)
NEUT#: 5.7 10*3/uL (ref 1.5–6.5)
NEUT%: 80.6 % — AB (ref 38.4–76.8)
Platelets: 348 10*3/uL (ref 145–400)
RBC: 3.98 10*6/uL (ref 3.70–5.45)
RDW: 12.8 % (ref 11.2–14.5)
Retic %: 1.82 % (ref 0.70–2.10)
Retic Ct Abs: 72.44 10*3/uL (ref 33.70–90.70)
WBC: 7.1 10*3/uL (ref 3.9–10.3)
lymph#: 0.5 10*3/uL — ABNORMAL LOW (ref 0.9–3.3)

## 2017-01-05 LAB — IRON AND TIBC
%SAT: 35 % (ref 21–57)
IRON: 106 ug/dL (ref 41–142)
TIBC: 303 ug/dL (ref 236–444)
UIBC: 196 ug/dL (ref 120–384)

## 2017-01-05 LAB — COMPREHENSIVE METABOLIC PANEL
ALT: 17 U/L (ref 0–55)
ANION GAP: 8 meq/L (ref 3–11)
AST: 18 U/L (ref 5–34)
Albumin: 3.4 g/dL — ABNORMAL LOW (ref 3.5–5.0)
Alkaline Phosphatase: 76 U/L (ref 40–150)
BUN: 22.5 mg/dL (ref 7.0–26.0)
CALCIUM: 9.6 mg/dL (ref 8.4–10.4)
CHLORIDE: 105 meq/L (ref 98–109)
CO2: 25 mEq/L (ref 22–29)
CREATININE: 0.8 mg/dL (ref 0.6–1.1)
EGFR: >60 mL/min/{1.73_m2} (ref >60)
Glucose: 102 mg/dl (ref 70–140)
POTASSIUM: 4.4 meq/L (ref 3.5–5.1)
Sodium: 138 mEq/L (ref 136–145)
Total Bilirubin: 0.25 mg/dL (ref 0.20–1.20)
Total Protein: 6.9 g/dL (ref 6.4–8.3)

## 2017-01-05 LAB — FERRITIN: FERRITIN: 46 ng/mL (ref 9–269)

## 2017-01-05 NOTE — Progress Notes (Signed)
Patient cancelled nutrition appointment. 

## 2017-01-07 ENCOUNTER — Telehealth: Payer: Self-pay | Admitting: Family Medicine

## 2017-01-07 ENCOUNTER — Telehealth: Payer: Self-pay | Admitting: Hematology

## 2017-01-07 NOTE — Telephone Encounter (Signed)
Copied from [place] [id number]. Topic: Referral - Request >> Jan 07, 2017  9:36 AM Corie Chiquito, NT wrote: Reason for CRM: Patient called because she would like a referral to a Palliative Care Doctor. Stated that she has cancer and trying to figure out if she wants to do treatment. She also would like for the doctor to be local. If someone could give her a call back at [phone]

## 2017-01-07 NOTE — Telephone Encounter (Signed)
The only referral I can place is to hospice.  I think her oncologist should be able to help her with different options for palliative care.  I would suggest she discuss this with her oncologist and let me know if I can help in any way. Please let her know I have been thinking so much about her.s.

## 2017-01-07 NOTE — Telephone Encounter (Signed)
LMOVM stating TA's response/thx dmf 

## 2017-01-07 NOTE — Telephone Encounter (Signed)
TA-Plz see pt req for a referral for Palliative Care below/plz advise/thx dmf

## 2017-01-07 NOTE — Telephone Encounter (Signed)
Left message for patient regarding upcoming January appointments.  °

## 2017-01-10 ENCOUNTER — Other Ambulatory Visit: Payer: Self-pay | Admitting: Hematology

## 2017-01-11 ENCOUNTER — Other Ambulatory Visit: Payer: Self-pay | Admitting: *Deleted

## 2017-01-11 MED ORDER — DIPHENOXYLATE-ATROPINE 2.5-0.025 MG PO TABS: 1.0000 | ORAL_TABLET | Freq: Four times a day (QID) | ORAL | 1 refills | Status: DC | PRN

## 2017-01-17 ENCOUNTER — Other Ambulatory Visit: Payer: Self-pay | Admitting: Hematology

## 2017-01-17 DIAGNOSIS — C20 Malignant neoplasm of rectum: Secondary | ICD-10-CM

## 2017-01-17 DIAGNOSIS — Z9884 Bariatric surgery status: Secondary | ICD-10-CM | POA: Diagnosis not present

## 2017-01-17 DIAGNOSIS — Z931 Gastrostomy status: Secondary | ICD-10-CM | POA: Diagnosis not present

## 2017-01-17 DIAGNOSIS — Z8049 Family history of malignant neoplasm of other genital organs: Secondary | ICD-10-CM | POA: Diagnosis not present

## 2017-01-18 ENCOUNTER — Telehealth: Payer: Self-pay | Admitting: *Deleted

## 2017-01-18 NOTE — Telephone Encounter (Signed)
Received call from IR/Tiffany stating that next opening for port is next Tuesday.  Informed that this is good.

## 2017-01-18 NOTE — Progress Notes (Signed)
[place]  [phone] [phone]  Clinic Follow Up Note   Patient Care Team: Lucille Passy, MD as PCP - General (Family Medicine) Irene Shipper, MD as Consulting Physician (Gastroenterology) Leighton Ruff, MD as Consulting Physician (General Surgery) Truitt Merle, MD as Consulting Physician (Hematology) Kyung Rudd, MD as Consulting Physician (Radiation Oncology)   Date of Service:  01/19/2017  CHIEF COMPLAINTS:  Follow up rectal Adenocarcinoma  Oncology History   Cancer Staging Rectal adenocarcinoma [facility]) Staging form: Colon and Rectum, AJCC 8th Edition - Clinical stage from 07/09/2016: Stage IIA (cT3, cN0, cM0) - Signed by Truitt Merle, MD on 08/16/2016       Rectal adenocarcinoma ([place])   07/09/2016 Pathology Results    Diagnosis Surgical [P], rectum mass - INVASIVE ADENOCARCINOMA. - SEE COMMENT.      07/09/2016 Procedure    Colonoscopy A non-obstructing large friable mass was found in the distal rectum. This began at approximately 1 cm above the anal verge and extended proximal for a distance of 8-10 cm. The lesion was bulky and involved approximately 75% of the luminal circumference. Multiple biopsies were taken. A few small-mouthed diverticula were found in the left colon. The exam was otherwise without abnormality on direct views. Retroflexion not performed purposely.      07/12/2016 Imaging    CT C/A/P with contrast IMPRESSION: Rectal wall thickening/mass, compatible with known rectal adenocarcinoma. No findings specific for metastatic disease in the chest, abdomen, or pelvis. 2.2 cm probable hemangioma in segment 4B, although poorly evaluated. Consider MRI abdomen with/ without multihance contrast for definitive characterization, as clinically warranted. Bilateral adrenal nodules measuring up to 1.6 cm on the right, likely reflecting benign adrenal adenomas, although technically indeterminate. If MRI abdomen is performed, these can be  definitively characterized at that time.      07/20/2016 Initial Diagnosis    Rectal adenocarcinoma ([place])      08/03/2016 Imaging    MRI AP W WO Contrast 08/03/16 IMPRESSION: 1. Large rectal mass measures 6.1 cm in length. No obstruction identified. This is compatible with at least a T3bN0M0 lesion. 2. No specific features highly suspicious for nodal metastasis or metastatic disease to the upper abdomen. 3. Indeterminate arterial phase enhancing lesion within the anterior dome of liver measures less than 1 cm. This may represent a benign liver lesions such as FNH or adenoma. Less favored with the hypervascular liver metastasis. Followup imaging at 6 months with repeat MRI of the liver is advise. 4. Bilateral adrenal adenomas      08/04/2016 - 09/16/2016 Radiation Therapy    Concurrent chemo radiation with Dr. [name]      08/04/2016 - 09/16/2016 Chemotherapy    Concurrent chemo radiation with Xeloda, 1500mg  twice daily      12/06/2016 Surgery    ULTRA LOW ANTERIOR RESECTION OF SIGMOID COLON AND RECTUM WITH COLOANAL ANASTAMOSIS AND LOOP ILEOSTOMY  by Dr. Drue Flirt at [facility]  12/06/16      12/06/2016 Pathology Results    FINAL PATHOLOGIC DIAGNOSIS 12/06/16 MICROSCOPIC EXAMINATION AND DIAGNOSIS  A.ANAL CANAL, MUCOSECTOMY: Benign rectal and fibroadipose tissue. Negative for malignancy.  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 Mucinous tumor focally invades through the muscularis propria into pericolorectal tissue. Radial margin interpreted as involved by invasive carcinoma (invasive acellular mucin with rare viable tumor cells focally comes to within 0.2 mm [0.02 cm] of the inked radial soft tissue  margin). Treatment effect present, predominantly  invasive acellular mucin associated with only very rare small groups of viable tumor cells (near complete response). 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 AJCC Pathologic Stage: ypT3 pN1a. See Cancer Case Summary.  C.POSTERIOR ANAL CANAL, EXCISION: Benign rectal tissue. Negative for malignancy.       Chemotherapy     Xeldoa 1500mg  BID for 2 weeks on and 1 week off on 01/10/17 and start oxaliplatin every 3 weeks with cycle 2.         HISTORY OF PRESENTING ILLNESS: 07/20/16 Tonya [name] [facility] 67 y.o. female is here because of recently discovered invasive adenocarcinoma. She is accompanied by a friend today. The patient underwent colonoscopy on 07/09/16 with Dr. Scarlette [name]. According to Dr. Blanch Media note from that encounter, the patient presented with complaints of rectal bleeding and iron deficiency anemia. She reports she was told she has hemorrhoids and initially was not concerned about the rectal bleeding. Her most recent prior colonoscopy was 2005. On exam, Dr. Henrene [name] noted a non-obstructing large friable mass was found in the distal rectum. This began at approximately 1 cm above the anal verge and extended proximal for a distance of 8-10 cm. The lesion was bulky and involved approximately 75% of the luminal circumference. Biopsy of the rectal mass on 07/09/16 showed invasive adenocarcinoma.  CT C/A/P on 07/12/16 revealed rectal wall thickening/mass, compatible with known rectal adenocarcinoma.No findings specific for metastatic disease in the chest, abdomen, or pelvis. 2.2 cm probable hemangioma in segment 4B, although poorly evaluated. Consider MRI abdomen with/ without multihance contrast for definitive characterization, as clinically warranted. Bilateral adrenal nodules measuring up to 1.6 cm  on the right, likely reflecting benign adrenal adenomas, although technically indeterminate. If MRI abdomen is performed, these can be definitively characterized at that time.  On 07/19/16 the patient consulted with Dr. Leighton Ruff of [facility]. Per her note, she recommends complete metastatic workup with MRI to evaluate liver, adrenal gland, and rectum. The patient will follow up with Dr. Marcello Moores following neoadjuvant therapy to discuss surgical resection.  The patient reports she has lost 30 lbs since January. She reports ongoing fatigue, which her PCP related to iron deficiency and vitamin D deficiency. She reports occasional incontinence, and irregular bowel activity. She reports ongoing bright red rectal bleeding. She denies cramps or pain. She takes 2 iron pills daily.  CURRENT THERAPY: pendind adjuvant FOLFOX, starting next week   INTERVAL HISTORY:  [name] is here for a follow-up. She presents to the clinic today accompanied by her friend Juliann Pulse. She is doing okay overall. She reports watery diarrhea that is unchanged from last visit. She has no solid bowel movements. She uses 2 lomotil 4 times daily. She takes a multivitamin, potassium and iron supplement daily.   On review of systems, pt denies pain, decreased appetite, vomiting, weight loss, dehydration or any other complaints at this time. Pertinent positives are listed and detailed within the above HPI.     MEDICAL HISTORY:  Past Medical History:  Diagnosis Date  . Coronary artery disease 07/2003  . Myocardial infarction ([place])   . Obesity 2003   s/p gastric bypass     SURGICAL HISTORY: Past Surgical History:  Procedure Laterality Date  . CHOLECYSTECTOMY  1999  . GASTRIC BYPASS  2003    SOCIAL HISTORY: Social History   Socioeconomic History  .  Marital status: Single    Spouse name: Not on file  . Number of children: 0  . Years of education: Not on file  . Highest education level: Not on  file  Social Needs  . Financial resource strain: Not on file  . Food insecurity - worry: Not on file  . Food insecurity - inability: Not on file  . Transportation needs - medical: Not on file  . Transportation needs - non-medical: Not on file  Occupational History  . Occupation: retired  Tobacco Use  . Smoking status: Never Smoker  . Smokeless tobacco: Never Used  Substance and Sexual Activity  . Alcohol use: No  . Drug use: No  . Sexual activity: Not on file  Other Topics Concern  . Not on file  Social History Narrative  . Not on file    FAMILY HISTORY: Family History  Problem Relation Age of Onset  . Heart attack Mother        d.93  . Throat cancer Mother 50  . Clotting disorder Mother   . Liver disease Mother   . Kidney disease Mother   . Heart attack Father        d.92  . Prostate cancer Father 33  . Uterine cancer Sister 51       treated with total hysterectomy and radiation  . Thyroid cancer Sister 60       treated with thyroidectomy  . Cancer Maternal Uncle        d.80s unspecified type of cancer. History of smoking.  Marland Kitchen Uterine cancer Sister 66  . Colon cancer Cousin 36       paternal first-cousin    ALLERGIES:  is allergic to bactrim [sulfamethoxazole-trimethoprim]; phenergan [promethazine hcl]; sulfa antibiotics; and penicillins.  MEDICATIONS:  Current Outpatient Medications  Medication Sig Dispense Refill  . cholecalciferol (VITAMIN D) 1000 UNITS tablet Take 1,000 Units by mouth daily.      . diphenoxylate-atropine (LOMOTIL) 2.5-0.025 MG tablet Take 1-2 tablets by mouth 4 (four) times daily as needed for diarrhea or loose stools. 60 tablet 1  . ferrous sulfate (FERROUSUL) 325 (65 FE) MG tablet Take 1 tablet (325 mg total) by mouth daily with breakfast. (Patient taking differently: Take 325 mg by mouth. Take two by mouth daily) 30 tablet 6  . mirtazapine (REMERON) 15 MG tablet Take 1 tablet (15 mg total) by mouth at bedtime. 30 tablet 1  . Multiple  Vitamin (MULTIVITAMIN) tablet Take 1 tablet by mouth daily.    Marland Kitchen ibuprofen (ADVIL,MOTRIN) 200 MG tablet Take 200 mg by mouth every 6 (six) hours as needed.    . lidocaine-prilocaine (EMLA) cream Apply to affected area once 30 g 3  . ondansetron (ZOFRAN) 8 MG tablet Take 1 tablet (8 mg total) by mouth every 8 (eight) hours as needed for nausea or vomiting. (Patient not taking: Reported on 10/07/2016) 20 tablet 1  . ondansetron (ZOFRAN) 8 MG tablet Take 1 tablet (8 mg total) by mouth 2 (two) times daily as needed for refractory nausea / vomiting. Start on day 3 after chemotherapy. 30 tablet 1  . potassium chloride SA (K-DUR,KLOR-CON) 20 MEQ tablet Take 1 tablet (20 mEq total) by mouth 2 (two) times daily. For 5 days Then take once a day (Patient not taking: Reported on 01/19/2017) 40 tablet 1  . prochlorperazine (COMPAZINE) 10 MG tablet Take 1 tablet (10 mg total) by mouth every 6 (six) hours as needed for nausea or vomiting. (Patient not taking: Reported on 10/07/2016)  30 tablet 1  . prochlorperazine (COMPAZINE) 10 MG tablet Take 1 tablet (10 mg total) by mouth every 6 (six) hours as needed (Nausea or vomiting). 30 tablet 1  . traMADol (ULTRAM) 50 MG tablet Take 1 tablet (50 mg total) by mouth every 6 (six) hours as needed. (Patient not taking: Reported on 10/07/2016) 90 tablet 0   Current Facility-Administered Medications  Medication Dose Route Frequency Provider Last Rate Last Dose  . 0.9 %  sodium chloride infusion  500 mL Intravenous Continuous Irene Shipper, MD        REVIEW OF SYSTEMS:  Constitutional: Denies fevers, chills or abnormal night sweats (+) good appetite, with change in diet (+) steady weight  (+) low energy Eyes: Denies blurriness of vision, double vision or watery eyes Ears, nose, mouth, throat, and face: Denies mucositis or sore throat Respiratory: Denies cough, dyspnea or wheezes Cardiovascular: Denies palpitation, chest discomfort or lower extremity swelling Gastrointestinal:   Denies nausea, heartburn (+) very loose stool output (+) ileostomy bag Skin: Denies abnormal skin rashes  Lymphatics: Denies new lymphadenopathy or easy bruising Neurological:Denies numbness, tingling or new weaknesses Behavioral/Psych: no new changes (+) overall down mood, depression All other systems were reviewed with the patient and are negative.  PHYSICAL EXAMINATION:  ECOG PERFORMANCE STATUS: 1 - Symptomatic but completely ambulatory Vitals:   01/19/17 0924  BP: 112/71  Pulse: 67  Resp: 18  Temp: 98 F (36.7 C)  TempSrc: Oral  SpO2: 99%  Weight: 127 lb 3.2 oz (57.7 kg)  Height: 5' (1.524 m)    GENERAL:alert, no distress and comfortable SKIN: skin color, texture, turgor are normal, no rashes or significant lesions, Mild erythema parienal area, no skin breakdown or discharge  EYES: normal, conjunctiva are pink and non-injected, sclera clear OROPHARYNX:no exudate, no erythema and lips, buccal mucosa, and tongue normal NECK: supple, thyroid normal size, non-tender, without nodularity LYMPH:  no palpable lymphadenopathy in the cervical, axillary or inguinal LUNGS: clear to auscultation and percussion with normal breathing effort HEART: regular rate & rhythm and no murmurs and no lower extremity edema ABDOMEN:abdomen soft, non-tender, and normal bowel sounds. No organomegaly. Low abdomen (+) surgical incision healing well (+) ileostomy bag with watery stool  Musculoskeletal:no cyanosis of digits and no clubbing PSYCH: alert & oriented x 3 with fluent speech NEURO: no focal motor/sensory deficits Rectal Exam: Deferred today   LABORATORY DATA:  I have reviewed the data as listed. CBC Latest Ref Rng & Units 01/19/2017 01/05/2017 12/22/2016  WBC 3.9 - 10.3 K/uL 7.8 7.1 9.8  Hemoglobin 11.6 - 15.9 g/dL 11.5(L) 11.8 10.2(L)  Hematocrit 34.8 - 46.6 % 36.8 36.6 32.2(L)  Platelets 145 - 400 K/uL 373 348 551(H)   CMP Latest Ref Rng & Units 01/19/2017 01/05/2017 12/22/2016  Glucose 70 -  140 mg/dL 76 102 116  BUN 7 - 26 mg/dL 17 22.5 16.5  Creatinine 0.60 - 1.10 mg/dL 0.72 0.8 0.7  Sodium 136 - 145 mmol/L 140 138 137  Potassium 3.3 - 4.7 mmol/L 4.4 4.4 3.6  Chloride 98 - 109 mmol/L 107 - -  CO2 22 - 29 mmol/L 26 25 25   Calcium 8.4 - 10.4 mg/dL 9.4 9.6 9.5  Total Protein 6.4 - 8.3 g/dL 6.6 6.9 6.7  Total Bilirubin 0.2 - 1.2 mg/dL <0.2(L) 0.25 0.22  Alkaline Phos 40 - 150 U/L 70 76 73  AST 5 - 34 U/L 19 18 18   ALT 0 - 55 U/L 17 17 18     PATHOLOGY:  FINAL PATHOLOGIC DIAGNOSIS 12/06/16 MICROSCOPIC EXAMINATION AND DIAGNOSIS  A.ANAL CANAL, MUCOSECTOMY: Benign rectal and fibroadipose tissue. Negative for malignancy.  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 Mucinous tumor focally invades through the muscularis propria into pericolorectal tissue. Radial margin interpreted as involved by invasive carcinoma (invasive acellular mucin with rare viable tumor cells focally comes to within 0.2 mm [0.02 cm] of the inked radial soft tissue margin). Treatment effect present, predominantly invasive acellular mucin associated with only very rare small groups of viable tumor cells (near complete response). 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 AJCC Pathologic Stage: ypT3 pN1a. See Cancer Case Summary.  C.POSTERIOR ANAL CANAL, EXCISION: Benign rectal tissue. Negative for malignancy.    07/09/2016: Diagnosis Surgical [P], rectum mass - INVASIVE ADENOCARCINOMA.  RADIOGRAPHIC STUDIES: I have personally reviewed the radiological images as listed and agreed  with the findings in the report. No results found.  ASSESSMENT & PLAN:  Tonya [name] is a 67 y.o. woman with past medical history of gastric bypass surgery, presented with rectal bleeding, loose bowel movement and stool incontinence for one half years, was recently diagnosed invasive rectal adenocarcinoma.  1. Invasive low rectal adenocarcinoma, cT3N0M0, ypT3N1a, near complete response, (+) margin  -I reviewed his CT scan findings, and surgical pathology results in great details with patient and her friend.  -I discussed her abdominal and pelvic MRI scan findings, which showed a T3 N0 rectal tumor. The liver lesion is indeterminate, low possibility of metastasis. We'll continue observation, and repeat scan in 3-6 months. -Patient agrees to proceed with concurrent chemotherapy and radiation. She completed on 09/16/16. Patient has tolerated concurrent chemotherapy and radiation very well., other than rectal pain.  -She underwent surgery on 12/06/16 with Dr. Drue Flirt at [facility]. The surgical path showed near complete response to neoadjuvant chemoradiation, but it was still a T3 lesion, and unfortunately the radial margin was positive and one out of 12 nodes was positive. She is stage III and moderate risk of distant recurrence and high risk of local recurrence   -I recommend adjuvant chemotherapy with CAPOX every 3 weeks or FOLFOX every 2 weeks for at least 2-3 months. I discussed the side effects in great detail. I gave reading material on these medications. Although I think she may tolerate FOLFOX better, she is very stressed about the pump, and see frequent visit for this treatment regimen.  After lengthy discussion, we decided to try Xeloda and oxaliplatin first.  I plan to reduce her oxaliplatin dose for the first cycle to see if she is able to tolerate. Side effects were discussed with patient in great detail. She agrees to proceed.  -Pt appears to be very depressed and devastated by her slow  recovery, colostomy bag etc, and has little interest in her life now. I also discussed her option to not proceed with adjuvant treatment. But she states she will do adjuvant chemo.  -After a lengthy discussion she previously decided to proceed with chemo CAPOX, however she is very concerned about side effects.  We reviewed the other option of FOLFOX again, and I encouraged her to consider FOLFOX which she would likely tolerate better.  After a lengthy discussion, she agreed with FOLFOX, plan for a total of 6-8 cycles  --Chemotherapy consent: Side effects including but does not not limited to,  fatigue, nausea, vomiting, diarrhea, hair loss, neuropathy, fluid retention, renal and kidney dysfunction, neutropenic fever, needed for blood transfusion, bleeding, coronary artery spasm, heart attack, and heart failure were discussed with patient in great detail. She agrees to proceed. -I will arrange port placement on 01/25/17 and first cycle FOLFOX the day after  -Will repeat scan after chemo completion. Will check tumor marker periodically as well - I advised her to call with any concerns after cycle 1. I encouraged her to come in for symptom management clinic if she needs it and to monitor for a fever.  -I plan to see her back before second cycle FOLFOX   2. Iron Deficient Anemia  - The patient takes 2 iron supplements daily. - Her iron has been somewhat low. - She will stop taking aspirin to reduce her bleeding risk. -Repeat iron studies showed iron deficiency, she will receive 1 dose IV iron Feraheme today. -Her levels are improved now, Ferritin is 272 as of 08/16/2016  -HG normal at 11.8 (01/05/17). Iron study for today is pending   3. Genetics  -Patient has 2 sisters who had uterine cancer, this is suspicious for lynch syndrome. -I encouraged the patient to pursue genetic testing based on her family history. This will determine if the patient has inheritable genetic syndrome, such as Lynch syndrome -The  patient is in agreement and I referred her to  Genetics but she has not been seen    4. History of gastric bypass surgery in 2003 -for obesity   5. History of DM and HTN -Resolved after her gastric bypass surgery. -We'll monitor her blood pressure and glucose closely during her chemotherapy treatment. -Her glucose is 72 today (01/19/17) and her BP is 112/71  6. Liver lesion -Indeterminate, no high suspicion for metastatic disease, plan to repeat liver MRI in the next few months   7. Hypokalemia  - Low and will recheck next week. - I advised her to eat foods that are high in potassium -I previously prescribed potassium chloride 20 meq daily -improved now, potassium at 4.4 (01/05/17) and (01/19/17)  8. Anorexia, Weight loss -She has loss weight since surgery as she has not been eating much  -I strongly encouraged her to take ensure boost or carnation breakfast  -I will set up a consultation with our dietitian  -Her weight loss has slowed down. I advised her to increase her ensure boost to 2 a day.  -She has gained 1 lb at todays visit (01/19/17)    9. Diarrhea, loose stool  -She has imodium at home, I prescribed lomotil to slow down her BMs -I advised her to increase her lomotil up to 6-8 times a day as needed.  --I suggested her to use imodium in conjunction with lomotil to help with diarrhea. She can take OTC medication for gas   10. Depression -I discussed as this is a hard time for her post surgery she can talk to someone at our clinic. I recommend her to see our Education officer, museum for counseling, she agreed.  -I prescribed Mirtazapine on (12/22/16) as this can also help her appetite and sleep, she can start at half dose to see if she can tolerate.  -She is doing better on Remoran, will continue   PLAN: -port placement 1/22  -Lab, flush and chemo FOLFOX 1/23, 2/6, 2/20, will omit 5-fu bolus for cycle 1, if she tolerates well, will add back from cycle 2  -F/u on 2/6 -Take imodium  along with lomotil for her  diarrhea     No orders of the defined types were placed in this encounter.  All questions were answered. The patient knows to call the clinic with any problems, questions or concerns.  I spent 20 minutes counseling the patient face to face. The total time spent in the appointment was 25 minutes and more than 50% was on counseling.  This document serves as a record of services personally performed by Truitt Merle, MD. It was created on her behalf by Theresia Bough, a trained medical scribe. The creation of this record is based on the scribe's personal observations and the provider's statements to them.   I have reviewed the above documentation for accuracy and completeness, and I agree with the above.     Truitt Merle, MD 01/19/2017

## 2017-01-19 ENCOUNTER — Encounter: Payer: Self-pay | Admitting: Hematology

## 2017-01-19 ENCOUNTER — Telehealth: Payer: Self-pay | Admitting: *Deleted

## 2017-01-19 ENCOUNTER — Inpatient Hospital Stay (HOSPITAL_BASED_OUTPATIENT_CLINIC_OR_DEPARTMENT_OTHER): Payer: Medicare HMO | Admitting: Hematology

## 2017-01-19 ENCOUNTER — Inpatient Hospital Stay: Payer: Medicare HMO | Attending: Hematology

## 2017-01-19 ENCOUNTER — Telehealth: Payer: Self-pay | Admitting: Hematology

## 2017-01-19 VITALS — BP 112/71 | HR 67 | Temp 98.0°F | Resp 18 | Ht 60.0 in | Wt 127.2 lb

## 2017-01-19 DIAGNOSIS — R197 Diarrhea, unspecified: Secondary | ICD-10-CM

## 2017-01-19 DIAGNOSIS — Z808 Family history of malignant neoplasm of other organs or systems: Secondary | ICD-10-CM | POA: Diagnosis not present

## 2017-01-19 DIAGNOSIS — E876 Hypokalemia: Secondary | ICD-10-CM | POA: Diagnosis not present

## 2017-01-19 DIAGNOSIS — Z5111 Encounter for antineoplastic chemotherapy: Secondary | ICD-10-CM | POA: Diagnosis not present

## 2017-01-19 DIAGNOSIS — R530 Neoplastic (malignant) related fatigue: Secondary | ICD-10-CM | POA: Insufficient documentation

## 2017-01-19 DIAGNOSIS — R634 Abnormal weight loss: Secondary | ICD-10-CM

## 2017-01-19 DIAGNOSIS — R630 Anorexia: Secondary | ICD-10-CM | POA: Insufficient documentation

## 2017-01-19 DIAGNOSIS — D500 Iron deficiency anemia secondary to blood loss (chronic): Secondary | ICD-10-CM | POA: Insufficient documentation

## 2017-01-19 DIAGNOSIS — F329 Major depressive disorder, single episode, unspecified: Secondary | ICD-10-CM | POA: Insufficient documentation

## 2017-01-19 DIAGNOSIS — R69 Illness, unspecified: Secondary | ICD-10-CM | POA: Diagnosis not present

## 2017-01-19 DIAGNOSIS — K909 Intestinal malabsorption, unspecified: Secondary | ICD-10-CM

## 2017-01-19 DIAGNOSIS — C20 Malignant neoplasm of rectum: Secondary | ICD-10-CM | POA: Diagnosis not present

## 2017-01-19 LAB — CBC WITH DIFFERENTIAL/PLATELET
BASOS ABS: 0 10*3/uL (ref 0.0–0.1)
BASOS PCT: 0 %
EOS ABS: 0.2 10*3/uL (ref 0.0–0.5)
Eosinophils Relative: 2 %
HCT: 36.8 % (ref 34.8–46.6)
HEMOGLOBIN: 11.5 g/dL — AB (ref 11.6–15.9)
Lymphocytes Relative: 8 %
Lymphs Abs: 0.6 10*3/uL — ABNORMAL LOW (ref 0.9–3.3)
MCH: 28.5 pg (ref 25.1–34.0)
MCHC: 31.3 g/dL — ABNORMAL LOW (ref 31.5–36.0)
MCV: 91.3 fL (ref 79.5–101.0)
MONOS PCT: 7 %
Monocytes Absolute: 0.5 10*3/uL (ref 0.1–0.9)
NEUTROS PCT: 83 %
Neutro Abs: 6.4 10*3/uL (ref 1.5–6.5)
Platelets: 373 10*3/uL (ref 145–400)
RBC: 4.03 MIL/uL (ref 3.70–5.45)
RDW: 13.2 % (ref 11.2–16.1)
WBC: 7.8 10*3/uL (ref 3.9–10.3)

## 2017-01-19 LAB — COMPREHENSIVE METABOLIC PANEL
ALK PHOS: 70 U/L (ref 40–150)
ALT: 17 U/L (ref 0–55)
AST: 19 U/L (ref 5–34)
Albumin: 3.3 g/dL — ABNORMAL LOW (ref 3.5–5.0)
Anion gap: 7 (ref 3–11)
BUN: 17 mg/dL (ref 7–26)
CO2: 26 mmol/L (ref 22–29)
CREATININE: 0.72 mg/dL (ref 0.60–1.10)
Calcium: 9.4 mg/dL (ref 8.4–10.4)
Chloride: 107 mmol/L (ref 98–109)
Glucose, Bld: 76 mg/dL (ref 70–140)
Potassium: 4.4 mmol/L (ref 3.3–4.7)
Sodium: 140 mmol/L (ref 136–145)
TOTAL PROTEIN: 6.6 g/dL (ref 6.4–8.3)
Total Bilirubin: <0.2 mg/dL — ABNORMAL LOW (ref 0.2–1.2)

## 2017-01-19 LAB — RETICULOCYTES
RBC.: 4.03 MIL/uL (ref 3.70–5.45)
RETIC CT PCT: 1.6 % (ref 0.7–2.1)
Retic Count, Absolute: 64.5 10*3/uL (ref 33.7–90.7)

## 2017-01-19 LAB — IRON AND TIBC
Iron: 71 ug/dL (ref 41–142)
SATURATION RATIOS: 25 % (ref 21–57)
TIBC: 283 ug/dL (ref 236–444)
UIBC: 212 ug/dL

## 2017-01-19 LAB — FERRITIN: FERRITIN: 42 ng/mL (ref 9–269)

## 2017-01-19 MED ORDER — ONDANSETRON HCL 8 MG PO TABS: 8.0000 mg | ORAL_TABLET | Freq: Two times a day (BID) | ORAL | 1 refills | Status: DC | PRN

## 2017-01-19 MED ORDER — LIDOCAINE-PRILOCAINE 2.5-2.5 % EX CREA: TOPICAL_CREAM | CUTANEOUS | 3 refills | Status: DC

## 2017-01-19 MED ORDER — PROCHLORPERAZINE MALEATE 10 MG PO TABS: 10.0000 mg | ORAL_TABLET | Freq: Four times a day (QID) | ORAL | 1 refills | Status: DC | PRN

## 2017-01-19 NOTE — Telephone Encounter (Signed)
Gave avs and calendar for January and February  °

## 2017-01-19 NOTE — Progress Notes (Signed)
Submitted auth request for Ondansetron today.  Status is pending.  °

## 2017-01-19 NOTE — Progress Notes (Signed)
Submitted auth request for Lidocaine/Prilocaine today.  Status is pending.  °

## 2017-01-19 NOTE — Telephone Encounter (Signed)
FYI "Need clarification or more information.  Diagnosis code does not authorize order for high Medicare Part D."   Authorized after this nurse provided [employer] of need to apply cream topically to port-a-cath for weekly lab draws in addition to chemotheray every 21-days.   Frequency of use may require a 30 G tube every fifteen days verses thirty-day standard.  Authorization will be faxed to [phone].

## 2017-01-20 ENCOUNTER — Encounter: Payer: Self-pay | Admitting: Hematology

## 2017-01-20 NOTE — Progress Notes (Signed)
ON PATHWAY REGIMEN - Colorectal  No Change  Continue With Treatment as Ordered.     A cycle is every 21 days:     Capecitabine      Oxaliplatin   **Always confirm dose/schedule in your pharmacy ordering system**    Patient Characteristics: Rectal Neoadjuvant/Adjuvant, T3 - T4, N0 or Any T, N+, Postoperative - Had Neoadjuvant Therapy - Preoperative Node Positive Current evidence of distant metastases<= No AJCC T Category: T3 AJCC N Category: N1a AJCC M Category: M0 AJCC 8 Stage Grouping: IIIB Intent of Therapy: Curative Intent, Discussed with Patient

## 2017-01-20 NOTE — Progress Notes (Signed)
Rcvd another letter from [facility] approving pt's Lidocaine-Prilocaine from 01/04/17 to 01/03/18.

## 2017-01-20 NOTE — Progress Notes (Signed)
Pt's Ondansetron was approved from 01/02/17  to 01/03/18 and her Lidocaine-Prilocaine was approved from 01/02/17 to 04/19/17.

## 2017-01-21 DIAGNOSIS — R69 Illness, unspecified: Secondary | ICD-10-CM | POA: Diagnosis not present

## 2017-01-24 ENCOUNTER — Other Ambulatory Visit: Payer: Self-pay | Admitting: Hematology

## 2017-01-24 ENCOUNTER — Other Ambulatory Visit: Payer: Self-pay | Admitting: Radiology

## 2017-01-25 ENCOUNTER — Other Ambulatory Visit: Payer: Self-pay | Admitting: Hematology

## 2017-01-25 ENCOUNTER — Encounter (HOSPITAL_COMMUNITY): Payer: Self-pay

## 2017-01-25 ENCOUNTER — Ambulatory Visit (HOSPITAL_COMMUNITY)
Admission: RE | Admit: 2017-01-25 | Discharge: 2017-01-25 | Disposition: A | Discharge status: Home / Self Care | Payer: Medicare HMO | Source: Ambulatory Visit | Attending: Hematology | Admitting: Hematology

## 2017-01-25 DIAGNOSIS — Z7982 Long term (current) use of aspirin: Secondary | ICD-10-CM | POA: Diagnosis not present

## 2017-01-25 DIAGNOSIS — Z9884 Bariatric surgery status: Secondary | ICD-10-CM | POA: Insufficient documentation

## 2017-01-25 DIAGNOSIS — I2510 Atherosclerotic heart disease of native coronary artery without angina pectoris: Secondary | ICD-10-CM | POA: Diagnosis not present

## 2017-01-25 DIAGNOSIS — Z800 Family history of malignant neoplasm of digestive organs: Secondary | ICD-10-CM | POA: Diagnosis not present

## 2017-01-25 DIAGNOSIS — Z9221 Personal history of antineoplastic chemotherapy: Secondary | ICD-10-CM | POA: Insufficient documentation

## 2017-01-25 DIAGNOSIS — Z808 Family history of malignant neoplasm of other organs or systems: Secondary | ICD-10-CM | POA: Insufficient documentation

## 2017-01-25 DIAGNOSIS — Z8042 Family history of malignant neoplasm of prostate: Secondary | ICD-10-CM | POA: Insufficient documentation

## 2017-01-25 DIAGNOSIS — Z923 Personal history of irradiation: Secondary | ICD-10-CM | POA: Insufficient documentation

## 2017-01-25 DIAGNOSIS — C20 Malignant neoplasm of rectum: Secondary | ICD-10-CM

## 2017-01-25 DIAGNOSIS — I252 Old myocardial infarction: Secondary | ICD-10-CM | POA: Insufficient documentation

## 2017-01-25 DIAGNOSIS — Z79899 Other long term (current) drug therapy: Secondary | ICD-10-CM | POA: Insufficient documentation

## 2017-01-25 DIAGNOSIS — Z5111 Encounter for antineoplastic chemotherapy: Secondary | ICD-10-CM | POA: Diagnosis not present

## 2017-01-25 HISTORY — PX: IR FLUORO GUIDE PORT INSERTION RIGHT: IMG5741

## 2017-01-25 HISTORY — PX: IR US GUIDE VASC ACCESS RIGHT: IMG2390

## 2017-01-25 LAB — CBC WITH DIFFERENTIAL/PLATELET
Basophils Absolute: 0 10*3/uL (ref 0.0–0.1)
Basophils Relative: 0 %
EOS ABS: 0.2 10*3/uL (ref 0.0–0.7)
EOS PCT: 3 %
HCT: 38.1 % (ref 36.0–46.0)
Hemoglobin: 12 g/dL (ref 12.0–15.0)
LYMPHS ABS: 0.9 10*3/uL (ref 0.7–4.0)
Lymphocytes Relative: 10 %
MCH: 28.4 pg (ref 26.0–34.0)
MCHC: 31.5 g/dL (ref 30.0–36.0)
MCV: 90.1 fL (ref 78.0–100.0)
Monocytes Absolute: 0.6 10*3/uL (ref 0.1–1.0)
Monocytes Relative: 7 %
Neutro Abs: 6.5 10*3/uL (ref 1.7–7.7)
Neutrophils Relative %: 80 %
Platelets: 353 10*3/uL (ref 150–400)
RBC: 4.23 MIL/uL (ref 3.87–5.11)
RDW: 13.5 % (ref 11.5–15.5)
WBC: 8.2 10*3/uL (ref 4.0–10.5)

## 2017-01-25 LAB — PROTIME-INR
INR: 0.88
Prothrombin Time: 11.9 seconds (ref 11.4–15.2)

## 2017-01-25 MED ORDER — FENTANYL CITRATE (PF) 100 MCG/2ML IJ SOLN
INTRAMUSCULAR | Status: AC
  Filled 2017-01-25: qty 2

## 2017-01-25 MED ORDER — CLINDAMYCIN PHOSPHATE 900 MG/50ML IV SOLN
INTRAVENOUS | Status: AC
  Administered 2017-01-25: 900 mg via INTRAVENOUS
  Filled 2017-01-25: qty 50

## 2017-01-25 MED ORDER — CLINDAMYCIN PHOSPHATE 900 MG/50ML IV SOLN
900.0000 mg | Freq: Once | INTRAVENOUS | Status: AC
  Administered 2017-01-25: 900 mg via INTRAVENOUS

## 2017-01-25 MED ORDER — FENTANYL CITRATE (PF) 100 MCG/2ML IJ SOLN
INTRAMUSCULAR | Status: AC | PRN
  Administered 2017-01-25 (×2): 50 ug via INTRAVENOUS

## 2017-01-25 MED ORDER — SODIUM CHLORIDE 0.9 % IV SOLN
INTRAVENOUS | Status: DC
  Administered 2017-01-25: 13:00:00 via INTRAVENOUS

## 2017-01-25 MED ORDER — MIDAZOLAM HCL 5 MG/5ML IJ SOLN
INTRAMUSCULAR | Status: AC | PRN
  Administered 2017-01-25: 1 mg via INTRAVENOUS

## 2017-01-25 MED ORDER — HEPARIN SOD (PORK) LOCK FLUSH 100 UNIT/ML IV SOLN
INTRAVENOUS | Status: AC
  Filled 2017-01-25: qty 5

## 2017-01-25 MED ORDER — MIDAZOLAM HCL 2 MG/2ML IJ SOLN
INTRAMUSCULAR | Status: AC
  Filled 2017-01-25: qty 4

## 2017-01-25 MED ORDER — HEPARIN SOD (PORK) LOCK FLUSH 100 UNIT/ML IV SOLN
INTRAVENOUS | Status: AC | PRN
  Administered 2017-01-25: 500 [IU] via INTRAVENOUS

## 2017-01-25 MED ORDER — LIDOCAINE-EPINEPHRINE (PF) 2 %-1:200000 IJ SOLN
INTRAMUSCULAR | Status: AC
  Filled 2017-01-25: qty 20

## 2017-01-25 MED ORDER — LIDOCAINE HCL 1 % IJ SOLN
INTRAMUSCULAR | Status: AC | PRN
  Administered 2017-01-25: 10 mL

## 2017-01-25 MED ORDER — MIDAZOLAM HCL 2 MG/2ML IJ SOLN
INTRAMUSCULAR | Status: AC | PRN
  Administered 2017-01-25: 1 mg via INTRAVENOUS

## 2017-01-25 MED ORDER — LIDOCAINE-EPINEPHRINE (PF) 2 %-1:200000 IJ SOLN
INTRAMUSCULAR | Status: AC | PRN
  Administered 2017-01-25: 20 mL

## 2017-01-25 NOTE — Discharge Instructions (Signed)
You may take dressing off and bathe in 24 hours.  Do not use emla cream for 2 weeks.  Implanted [name] Guide An implanted port is a type of central line that is placed under the skin. Central lines are used to provide IV access when treatment or nutrition needs to be given through a persons veins. Implanted ports are used for long-term IV access. An implanted port may be placed because:  You need IV medicine that would be irritating to the small veins in your hands or arms.  You need long-term IV medicines, such as antibiotics.  You need IV nutrition for a long period.  You need frequent blood draws for lab tests.  You need dialysis.  Implanted ports are usually placed in the chest area, but they can also be placed in the upper arm, the abdomen, or the leg. An implanted port has two main parts:  Reservoir. The reservoir is round and will appear as a small, raised area under your skin. The reservoir is the part where a needle is inserted to give medicines or draw blood.  Catheter. The catheter is a thin, flexible tube that extends from the reservoir. The catheter is placed into a large vein. Medicine that is inserted into the reservoir goes into the catheter and then into the vein.  How will I care for my incision site? Do not get the incision site wet. Bathe or shower as directed by your health care provider. How is my port accessed? Special steps must be taken to access the port:  Before the port is accessed, a numbing cream can be placed on the skin. This helps numb the skin over the port site.  Your health care provider uses a sterile technique to access the port. ? Your health care provider must put on a mask and sterile gloves. ? The skin over your port is cleaned carefully with an antiseptic and allowed to dry. ? The port is gently pinched between sterile gloves, and a needle is inserted into the port.  Only "non-coring" port needles should be used to access the port.  Once the port is accessed, a blood return should be checked. This helps ensure that the port is in the vein and is not clogged.  If your port needs to remain accessed for a constant infusion, a clear (transparent) bandage will be placed over the needle site. The bandage and needle will need to be changed every week, or as directed by your health care provider.  Keep the bandage covering the needle clean and dry. Do not get it wet. Follow your health care providers instructions on how to take a shower or bath while the port is accessed.  If your port does not need to stay accessed, no bandage is needed over the port.  What is flushing? Flushing helps keep the port from getting clogged. Follow your health care providers instructions on how and when to flush the port. Ports are usually flushed with saline solution or a medicine called heparin. The need for flushing will depend on how the port is used.  If the port is used for intermittent medicines or blood draws, the port will need to be flushed: ? After medicines have been given. ? After blood has been drawn. ? As part of routine maintenance.  If a constant infusion is running, the port may not need to be flushed.  How long will my port stay implanted? The port can stay in for as long as your  health care provider thinks it is needed. When it is time for the port to come out, surgery will be done to remove it. The procedure is similar to the one performed when the port was put in. When should I seek immediate medical care? When you have an implanted port, you should seek immediate medical care if:  You notice a bad smell coming from the incision site.  You have swelling, redness, or drainage at the incision site.  You have more swelling or pain at the port site or the surrounding area.  You have a fever that is not controlled with medicine.  This information is not intended to replace advice given to you by your health care provider.  Make sure you discuss any questions you have with your health care provider. Document Released: 12/21/2004 Document Revised: 05/29/2015 Document Reviewed: 08/28/2012 Elsevier Interactive Patient Education  2017 [place]. Moderate Conscious Sedation, Adult, Care After These instructions provide you with information about caring for yourself after your procedure. Your health care provider may also give you more specific instructions. Your treatment has been planned according to current medical practices, but problems sometimes occur. Call your health care provider if you have any problems or questions after your procedure. What can I expect after the procedure? After your procedure, it is common:  To feel sleepy for several hours.  To feel clumsy and have poor balance for several hours.  To have poor judgment for several hours.  To vomit if you eat too soon.  Follow these instructions at home: For at least 24 hours after the procedure:   Do not: ? Participate in activities where you could fall or become injured. ? Drive. ? Use heavy machinery. ? Drink alcohol. ? Take sleeping pills or medicines that cause drowsiness. ? Make important decisions or sign legal documents. ? Take care of children on your own.  Rest. Eating and drinking  Follow the diet recommended by your health care provider.  If you vomit: ? Drink water, juice, or soup when you can drink without vomiting. ? Make sure you have little or no nausea before eating solid foods. General instructions  Have a responsible adult stay with you until you are awake and alert.  Take over-the-counter and prescription medicines only as told by your health care provider.  If you smoke, do not smoke without supervision.  Keep all follow-up visits as told by your health care provider. This is important. Contact a health care provider if:  You keep feeling nauseous or you keep vomiting.  You feel light-headed.  You  develop a rash.  You have a fever. Get help right away if:  You have trouble breathing. This information is not intended to replace advice given to you by your health care provider. Make sure you discuss any questions you have with your health care provider. Document Released: 10/11/2012 Document Revised: 05/26/2015 Document Reviewed: 04/12/2015 Elsevier Interactive Patient Education  [employer].

## 2017-01-25 NOTE — Procedures (Signed)
Pre Procedure Dx: Colon Cancer Post Procedural Dx: Same  Successful placement of right IJ approach port-a-cath with tip at the superior caval atrial junction. The catheter is ready for immediate use.  Estimated Blood Loss: Minimal  Complications: None immediate.  Jay Talah Cookston, MD Pager #: [phone]   

## 2017-01-25 NOTE — Consult Note (Signed)
Chief Complaint: Patient was seen in consultation today for Port-A-Cath placement  Referring Physician(s): Feng,Yan  Supervising Physician: Sandi Mariscal  Patient Status: [place]  History of Present Illness: Tonya [name] is a 67 y.o. female with history of rectal adenocarcinoma initially diagnosed in July 2018, status post surgery 12/06/16 at [facility] and chemoradiation.  She did have positive margins with node involvement .  She presents today for Port-A-Cath placement for additional chemotherapy.  Past Medical History:  Diagnosis Date  . Coronary artery disease 07/2003  . Myocardial infarction ([place])   . Obesity 2003   s/p gastric bypass     Past Surgical History:  Procedure Laterality Date  . CHOLECYSTECTOMY  1999  . GASTRIC BYPASS  2003    Allergies: Bactrim [sulfamethoxazole-trimethoprim]; Phenergan [promethazine hcl]; Sulfa antibiotics; and Penicillins  Medications: Prior to Admission medications   Medication Sig Start Date End Date Taking? Authorizing Provider  aspirin 81 MG chewable tablet Chew 81 mg by mouth daily.   Yes [provider]  cholecalciferol (VITAMIN D) 1000 UNITS tablet Take 1,000 Units by mouth daily.     Yes [provider]  diphenoxylate-atropine (LOMOTIL) 2.5-0.025 MG tablet TAKE 1 TO 2 TABLETS BY MOUTH FOUR TIMES DAILY AS NEEDED 01/24/17  Yes Truitt Merle, MD  ferrous sulfate (FERROUSUL) 325 (65 FE) MG tablet Take 1 tablet (325 mg total) by mouth daily with breakfast. Patient taking differently: Take 325 mg by mouth. Take two by mouth daily 02/11/14  Yes Lucille Passy, MD  ibuprofen (ADVIL,MOTRIN) 200 MG tablet Take 200 mg by mouth every 6 (six) hours as needed.   Yes [provider]  mirtazapine (REMERON) 15 MG tablet Take 1 tablet (15 mg total) by mouth at bedtime. 12/22/16  Yes Truitt Merle, MD  Multiple Vitamin (MULTIVITAMIN) tablet Take 1 tablet by mouth daily.   Yes [provider]    ondansetron (ZOFRAN) 8 MG tablet Take 1 tablet (8 mg total) by mouth every 8 (eight) hours as needed for nausea or vomiting. Patient not taking: Reported on 10/07/2016 08/11/16   Truitt Merle, MD  potassium chloride SA (K-DUR,KLOR-CON) 20 MEQ tablet Take 1 tablet (20 mEq total) by mouth 2 (two) times daily. For 5 days Then take once a day Patient not taking: Reported on 01/19/2017 10/13/16   Truitt Merle, MD  prochlorperazine (COMPAZINE) 10 MG tablet Take 1 tablet (10 mg total) by mouth every 6 (six) hours as needed for nausea or vomiting. Patient not taking: Reported on 10/07/2016 08/11/16   Truitt Merle, MD  traMADol (ULTRAM) 50 MG tablet Take 1 tablet (50 mg total) by mouth every 6 (six) hours as needed. Patient not taking: Reported on 10/07/2016 09/13/16   Truitt Merle, MD     Family History  Problem Relation Age of Onset  . Heart attack Mother        d.93  . Throat cancer Mother 64  . Clotting disorder Mother   . Liver disease Mother   . Kidney disease Mother   . Heart attack Father        d.92  . Prostate cancer Father 9  . Uterine cancer Sister 74       treated with total hysterectomy and radiation  . Thyroid cancer Sister 43       treated with thyroidectomy  . Cancer Maternal Uncle        d.80s unspecified type of cancer. History of smoking.  Marland Kitchen Uterine cancer  Sister 31  . Colon cancer Cousin 65       paternal first-cousin    Social History   Socioeconomic History  . Marital status: Single    Spouse name: None  . Number of children: 0  . Years of education: None  . Highest education level: None  Social Needs  . Financial resource strain: None  . Food insecurity - worry: None  . Food insecurity - inability: None  . Transportation needs - medical: None  . Transportation needs - non-medical: None  Occupational History  . Occupation: retired  Tobacco Use  . Smoking status: Never Smoker  . Smokeless tobacco: Never Used  Substance and Sexual Activity  . Alcohol use: No  . Drug  use: No  . Sexual activity: None  Other Topics Concern  . None  Social History Narrative  . None      Review of Systems currently denies fever, headache, chest pain, dyspnea, cough, abdominal/back pain, nausea, vomiting or bleeding.  Vital Signs: BP (!) 142/79 (BP Location: Right Arm)   Pulse 69   Temp 98.1 F (36.7 C) (Oral)   Resp 18   SpO2 100%   Physical Exam awake, alert.  Chest clear to auscultation bilaterally.  Heart with normal rate, occasional ectopy.  Abdomen soft, right lower quadrant ostomy intact, + bowel sounds, nontender; trace bilateral lower extremity edema  Imaging: No results found.  Labs:  CBC: Recent Labs    12/22/16 0740 01/05/17 0907 01/19/17 0847 01/25/17 1230  WBC 9.8 7.1 7.8 8.2  HGB 10.2* 11.8 11.5* 12.0  HCT 32.2* 36.6 36.8 38.1  PLT 551* 348 373 353    COAGS: Recent Labs    01/25/17 1230  INR 0.88    BMP: Recent Labs    07/09/16 1030  10/07/16 0749 12/22/16 0740 01/05/17 0906 01/19/17 0847  NA 143   < > 142 137 138 140  K 3.6   < > 3.3* 3.6 4.4 4.4  CL 107  --   --   --   --  107  CO2 25   < > 23 25 25 26   GLUCOSE 79   < > 85 116 102 76  BUN 12   < > 19.5 16.5 22.5 17  CALCIUM 8.7   < > 9.0 9.5 9.6 9.4  CREATININE 0.52   < > 0.7 0.7 0.8 0.72  GFRNONAA  --   --   --   --   --  >60  GFRAA  --   --   --   --   --  >60   < > = values in this interval not displayed.    LIVER FUNCTION TESTS: Recent Labs    10/07/16 0749 12/22/16 0740 01/05/17 0906 01/19/17 0847  BILITOT 0.26 0.22 0.25 <0.2*  AST 18 18 18 19   ALT 18 18 17 17   ALKPHOS 79 73 76 70  PROT 5.9* 6.7 6.9 6.6  ALBUMIN 2.9* 3.1* 3.4* 3.3*    TUMOR MARKERS: Recent Labs    07/09/16 1030  CEA 13.9*    Assessment and Plan: 67 y.o. female with history of rectal adenocarcinoma initially diagnosed in July 2018, status post surgery 12/06/16 at [facility] and chemoradiation.  She did have positive margins with node involvement .  She presents  today for Port-A-Cath placement for additional chemotherapy.Risks and benefits discussed with the patient including, but not limited to bleeding, infection, pneumothorax, or fibrin sheath development and need for additional procedures.All of  the patient's questions were answered, patient is agreeable to proceed.Consent signed and in chart.     Thank you for this interesting consult.  I greatly enjoyed meeting Tonya [name] and look forward to participating in their care.  A copy of this report was sent to the requesting provider on this date.  Electronically Signed: D. Rowe Robert, PA-C 01/25/2017, 1:16 PM   I spent a total of  25 minutes   in face to face in clinical consultation, greater than 50% of which was counseling/coordinating care for Port-A-Cath placement

## 2017-01-26 ENCOUNTER — Inpatient Hospital Stay: Payer: Medicare HMO

## 2017-01-26 VITALS — BP 118/66 | HR 62 | Temp 98.8°F | Resp 16

## 2017-01-26 DIAGNOSIS — C20 Malignant neoplasm of rectum: Secondary | ICD-10-CM | POA: Diagnosis not present

## 2017-01-26 DIAGNOSIS — D500 Iron deficiency anemia secondary to blood loss (chronic): Secondary | ICD-10-CM

## 2017-01-26 DIAGNOSIS — E876 Hypokalemia: Secondary | ICD-10-CM | POA: Diagnosis not present

## 2017-01-26 DIAGNOSIS — C210 Malignant neoplasm of anus, unspecified: Secondary | ICD-10-CM | POA: Diagnosis not present

## 2017-01-26 DIAGNOSIS — R69 Illness, unspecified: Secondary | ICD-10-CM | POA: Diagnosis not present

## 2017-01-26 DIAGNOSIS — R630 Anorexia: Secondary | ICD-10-CM | POA: Diagnosis not present

## 2017-01-26 DIAGNOSIS — R197 Diarrhea, unspecified: Secondary | ICD-10-CM | POA: Diagnosis not present

## 2017-01-26 DIAGNOSIS — Z5111 Encounter for antineoplastic chemotherapy: Secondary | ICD-10-CM | POA: Diagnosis not present

## 2017-01-26 DIAGNOSIS — Z808 Family history of malignant neoplasm of other organs or systems: Secondary | ICD-10-CM | POA: Diagnosis not present

## 2017-01-26 DIAGNOSIS — R634 Abnormal weight loss: Secondary | ICD-10-CM | POA: Diagnosis not present

## 2017-01-26 DIAGNOSIS — R530 Neoplastic (malignant) related fatigue: Secondary | ICD-10-CM | POA: Diagnosis not present

## 2017-01-26 LAB — CBC WITH DIFFERENTIAL (CANCER CENTER ONLY)
BASOS ABS: 0 10*3/uL (ref 0.0–0.1)
Basophils Relative: 0 %
Eosinophils Absolute: 0.1 10*3/uL (ref 0.0–0.5)
Eosinophils Relative: 2 %
HEMATOCRIT: 33.4 % — AB (ref 34.8–46.6)
HEMOGLOBIN: 10.5 g/dL — AB (ref 11.6–15.9)
LYMPHS PCT: 9 %
Lymphs Abs: 0.5 10*3/uL — ABNORMAL LOW (ref 0.9–3.3)
MCH: 28.4 pg (ref 25.1–34.0)
MCHC: 31.4 g/dL — ABNORMAL LOW (ref 31.5–36.0)
MCV: 90.3 fL (ref 79.5–101.0)
Monocytes Absolute: 0.3 10*3/uL (ref 0.1–0.9)
Monocytes Relative: 5 %
NEUTROS ABS: 5 10*3/uL (ref 1.5–6.5)
NEUTROS PCT: 84 %
Platelet Count: 302 10*3/uL (ref 145–400)
RBC: 3.7 MIL/uL (ref 3.70–5.45)
RDW: 13.5 % (ref 11.2–16.1)
WBC: 6 10*3/uL (ref 3.9–10.3)

## 2017-01-26 LAB — COMPREHENSIVE METABOLIC PANEL
ALT: 20 U/L (ref 0–55)
AST: 22 U/L (ref 5–34)
Albumin: 3 g/dL — ABNORMAL LOW (ref 3.5–5.0)
Alkaline Phosphatase: 66 U/L (ref 40–150)
Anion gap: 8 (ref 3–11)
BUN: 17 mg/dL (ref 7–26)
CHLORIDE: 110 mmol/L — AB (ref 98–109)
CO2: 22 mmol/L (ref 22–29)
CREATININE: 0.72 mg/dL (ref 0.60–1.10)
Calcium: 9 mg/dL (ref 8.4–10.4)
GFR calc Af Amer: >60 mL/min (ref >60)
Glucose, Bld: 81 mg/dL (ref 70–140)
Potassium: 4.5 mmol/L (ref 3.3–4.7)
Sodium: 140 mmol/L (ref 136–145)
Total Bilirubin: <0.2 mg/dL — ABNORMAL LOW (ref 0.2–1.2)
Total Protein: 6.2 g/dL — ABNORMAL LOW (ref 6.4–8.3)

## 2017-01-26 LAB — IRON AND TIBC
IRON: 35 ug/dL — AB (ref 41–142)
Saturation Ratios: 14 % — ABNORMAL LOW (ref 21–57)
TIBC: 261 ug/dL (ref 236–444)
UIBC: 226 ug/dL

## 2017-01-26 LAB — FERRITIN: Ferritin: 34 ng/mL (ref 9–269)

## 2017-01-26 MED ORDER — LEUCOVORIN CALCIUM INJECTION 350 MG
400.0000 mg/m2 | Freq: Once | INTRAVENOUS | Status: AC
  Administered 2017-01-26: 624 mg via INTRAVENOUS
  Filled 2017-01-26: qty 31.2

## 2017-01-26 MED ORDER — FLUOROURACIL CHEMO INJECTION 5 GM/100ML
2400.0000 mg/m2 | INTRAVENOUS | Status: DC
  Administered 2017-01-26: 3750 mg via INTRAVENOUS
  Filled 2017-01-26: qty 75

## 2017-01-26 MED ORDER — DEXAMETHASONE SODIUM PHOSPHATE 100 MG/10ML IJ SOLN
10.0000 mg | Freq: Once | INTRAMUSCULAR | Status: DC
  Filled 2017-01-26: qty 1

## 2017-01-26 MED ORDER — OXALIPLATIN CHEMO INJECTION 100 MG/20ML
85.0000 mg/m2 | Freq: Once | INTRAVENOUS | Status: AC
  Administered 2017-01-26: 135 mg via INTRAVENOUS
  Filled 2017-01-26: qty 7

## 2017-01-26 MED ORDER — SODIUM CHLORIDE 0.9 % IV SOLN: 10.0000 mg | Freq: Once | INTRAVENOUS | Status: DC

## 2017-01-26 MED ORDER — DEXTROSE 5 % IV SOLN
Freq: Once | INTRAVENOUS | Status: AC
  Administered 2017-01-26: 10:00:00 via INTRAVENOUS

## 2017-01-26 MED ORDER — DEXAMETHASONE SODIUM PHOSPHATE 10 MG/ML IJ SOLN
10.0000 mg | Freq: Once | INTRAMUSCULAR | Status: AC
  Administered 2017-01-26: 10 mg via INTRAVENOUS

## 2017-01-26 MED ORDER — SODIUM CHLORIDE 0.9 % IV SOLN
510.0000 mg | Freq: Once | INTRAVENOUS | Status: AC
  Administered 2017-01-26: 510 mg via INTRAVENOUS
  Filled 2017-01-26: qty 17

## 2017-01-26 MED ORDER — PALONOSETRON HCL INJECTION 0.25 MG/5ML
0.2500 mg | Freq: Once | INTRAVENOUS | Status: AC
  Administered 2017-01-26: 0.25 mg via INTRAVENOUS

## 2017-01-26 MED ORDER — SODIUM CHLORIDE 0.9 % IV SOLN: Freq: Once | INTRAVENOUS | Status: DC

## 2017-01-26 NOTE — Patient Instructions (Signed)
[facility] Discharge Instructions for Patients Receiving Chemotherapy  Today you received the following chemotherapy agents Leucovorin, Adrucil (5FU), Oxaliplatin.  To help prevent nausea and vomiting after your treatment, we encourage you to take your nausea medication as directed.   If you develop nausea and vomiting that is not controlled by your nausea medication, call the clinic.   BELOW ARE SYMPTOMS THAT SHOULD BE REPORTED IMMEDIATELY:  *FEVER GREATER THAN 100.5 F  *CHILLS WITH OR WITHOUT FEVER  NAUSEA AND VOMITING THAT IS NOT CONTROLLED WITH YOUR NAUSEA MEDICATION  *UNUSUAL SHORTNESS OF BREATH  *UNUSUAL BRUISING OR BLEEDING  TENDERNESS IN MOUTH AND THROAT WITH OR WITHOUT PRESENCE OF ULCERS  *URINARY PROBLEMS  *BOWEL PROBLEMS  UNUSUAL RASH Items with * indicate a potential emergency and should be followed up as soon as possible.  Feel free to call the clinic should you have any questions or concerns. The clinic phone number is [phone].  Please show the [facility] at check-in to the Emergency Department and triage nurse.  Leucovorin injection What is this medicine? LEUCOVORIN (loo koe VOR in) is used to prevent or treat the harmful effects of some medicines. This medicine is used to treat anemia caused by a low amount of folic acid in the body. It is also used with 5-fluorouracil (5-FU) to treat colon cancer. This medicine may be used for other purposes; ask your health care provider or pharmacist if you have questions. What should I tell my health care provider before I take this medicine? They need to know if you have any of these conditions: -anemia from low levels of vitamin B-12 in the blood -an unusual or allergic reaction to leucovorin, folic acid, other medicines, foods, dyes, or preservatives -pregnant or trying to get pregnant -breast-feeding How should I use this medicine? This medicine is for injection into a muscle or into a  vein. It is given by a health care professional in a hospital or clinic setting. Talk to your pediatrician regarding the use of this medicine in children. Special care may be needed. Overdosage: If you think you have taken too much of this medicine contact a poison control center or emergency room at once. NOTE: This medicine is only for you. Do not share this medicine with others. What if I miss a dose? This does not apply. What may interact with this medicine? -capecitabine -fluorouracil -phenobarbital -phenytoin -primidone -trimethoprim-sulfamethoxazole This list may not describe all possible interactions. Give your health care provider a list of all the medicines, herbs, non-prescription drugs, or dietary supplements you use. Also tell them if you smoke, drink alcohol, or use illegal drugs. Some items may interact with your medicine. What should I watch for while using this medicine? Your condition will be monitored carefully while you are receiving this medicine. This medicine may increase the side effects of 5-fluorouracil, 5-FU. Tell your doctor or health care professional if you have diarrhea or mouth sores that do not get better or that get worse. What side effects may I notice from receiving this medicine? Side effects that you should report to your doctor or health care professional as soon as possible: -allergic reactions like skin rash, itching or hives, swelling of the face, lips, or tongue -breathing problems -fever, infection -mouth sores -unusual bleeding or bruising -unusually weak or tired Side effects that usually do not require medical attention (report to your doctor or health care professional if they continue or are bothersome): -constipation or diarrhea -loss of appetite -  nausea, vomiting This list may not describe all possible side effects. Call your doctor for medical advice about side effects. You may report side effects to FDA at 1-800-FDA-1088. Where should  I keep my medicine? This drug is given in a hospital or clinic and will not be stored at home. NOTE: This sheet is a summary. It may not cover all possible information. If you have questions about this medicine, talk to your doctor, pharmacist, or health care provider.  2018 Elsevier/Gold Standard (2007-06-27 16:50:29)  Fluorouracil, 5-FU injection What is this medicine? FLUOROURACIL, 5-FU (flure oh YOOR a sil) is a chemotherapy drug. It slows the growth of cancer cells. This medicine is used to treat many types of cancer like breast cancer, colon or rectal cancer, pancreatic cancer, and stomach cancer. This medicine may be used for other purposes; ask your health care provider or pharmacist if you have questions. COMMON BRAND NAME(S): Adrucil What should I tell my health care provider before I take this medicine? They need to know if you have any of these conditions: -blood disorders -dihydropyrimidine dehydrogenase (DPD) deficiency -infection (especially a virus infection such as chickenpox, cold sores, or herpes) -kidney disease -liver disease -malnourished, poor nutrition -recent or ongoing radiation therapy -an unusual or allergic reaction to fluorouracil, other chemotherapy, other medicines, foods, dyes, or preservatives -pregnant or trying to get pregnant -breast-feeding How should I use this medicine? This drug is given as an infusion or injection into a vein. It is administered in a hospital or clinic by a specially trained health care professional. Talk to your pediatrician regarding the use of this medicine in children. Special care may be needed. Overdosage: If you think you have taken too much of this medicine contact a poison control center or emergency room at once. NOTE: This medicine is only for you. Do not share this medicine with others. What if I miss a dose? It is important not to miss your dose. Call your doctor or health care professional if you are unable to keep  an appointment. What may interact with this medicine? -allopurinol -cimetidine -dapsone -digoxin -hydroxyurea -leucovorin -levamisole -medicines for seizures like ethotoin, fosphenytoin, phenytoin -medicines to increase blood counts like filgrastim, pegfilgrastim, sargramostim -medicines that treat or prevent blood clots like warfarin, enoxaparin, and dalteparin -methotrexate -metronidazole -pyrimethamine -some other chemotherapy drugs like busulfan, cisplatin, estramustine, vinblastine -trimethoprim -trimetrexate -vaccines Talk to your doctor or health care professional before taking any of these medicines: -acetaminophen -aspirin -ibuprofen -ketoprofen -naproxen This list may not describe all possible interactions. Give your health care provider a list of all the medicines, herbs, non-prescription drugs, or dietary supplements you use. Also tell them if you smoke, drink alcohol, or use illegal drugs. Some items may interact with your medicine. What should I watch for while using this medicine? Visit your doctor for checks on your progress. This drug may make you feel generally unwell. This is not uncommon, as chemotherapy can affect healthy cells as well as cancer cells. Report any side effects. Continue your course of treatment even though you feel ill unless your doctor tells you to stop. In some cases, you may be given additional medicines to help with side effects. Follow all directions for their use. Call your doctor or health care professional for advice if you get a fever, chills or sore throat, or other symptoms of a cold or flu. Do not treat yourself. This drug decreases your body's ability to fight infections. Try to avoid being around people who are sick.  This medicine may increase your risk to bruise or bleed. Call your doctor or health care professional if you notice any unusual bleeding. Be careful brushing and flossing your teeth or using a toothpick because you may  get an infection or bleed more easily. If you have any dental work done, tell your dentist you are receiving this medicine. Avoid taking products that contain aspirin, acetaminophen, ibuprofen, naproxen, or ketoprofen unless instructed by your doctor. These medicines may hide a fever. Do not become pregnant while taking this medicine. Women should inform their doctor if they wish to become pregnant or think they might be pregnant. There is a potential for serious side effects to an unborn child. Talk to your health care professional or pharmacist for more information. Do not breast-feed an infant while taking this medicine. Men should inform their doctor if they wish to father a child. This medicine may lower sperm counts. Do not treat diarrhea with over the counter products. Contact your doctor if you have diarrhea that lasts more than 2 days or if it is severe and watery. This medicine can make you more sensitive to the sun. Keep out of the sun. If you cannot avoid being in the sun, wear protective clothing and use sunscreen. Do not use sun lamps or tanning beds/booths. What side effects may I notice from receiving this medicine? Side effects that you should report to your doctor or health care professional as soon as possible: -allergic reactions like skin rash, itching or hives, swelling of the face, lips, or tongue -low blood counts - this medicine may decrease the number of white blood cells, red blood cells and platelets. You may be at increased risk for infections and bleeding. -signs of infection - fever or chills, cough, sore throat, pain or difficulty passing urine -signs of decreased platelets or bleeding - bruising, pinpoint red spots on the skin, black, tarry stools, blood in the urine -signs of decreased red blood cells - unusually weak or tired, fainting spells, lightheadedness -breathing problems -changes in vision -chest pain -mouth sores -nausea and vomiting -pain, swelling,  redness at site where injected -pain, tingling, numbness in the hands or feet -redness, swelling, or sores on hands or feet -stomach pain -unusual bleeding Side effects that usually do not require medical attention (report to your doctor or health care professional if they continue or are bothersome): -changes in finger or toe nails -diarrhea -dry or itchy skin -hair loss -headache -loss of appetite -sensitivity of eyes to the light -stomach upset -unusually teary eyes This list may not describe all possible side effects. Call your doctor for medical advice about side effects. You may report side effects to FDA at 1-800-FDA-1088. Where should I keep my medicine? This drug is given in a hospital or clinic and will not be stored at home. NOTE: This sheet is a summary. It may not cover all possible information. If you have questions about this medicine, talk to your doctor, pharmacist, or health care provider.  2018 Elsevier/Gold Standard (2007-04-26 13:53:16)  Oxaliplatin Injection What is this medicine? OXALIPLATIN (ox AL i PLA tin) is a chemotherapy drug. It targets fast dividing cells, like cancer cells, and causes these cells to die. This medicine is used to treat cancers of the colon and rectum, and many other cancers. This medicine may be used for other purposes; ask your health care provider or pharmacist if you have questions. COMMON BRAND NAME(S): Eloxatin What should I tell my health care provider before I take  this medicine? They need to know if you have any of these conditions: -kidney disease -an unusual or allergic reaction to oxaliplatin, other chemotherapy, other medicines, foods, dyes, or preservatives -pregnant or trying to get pregnant -breast-feeding How should I use this medicine? This drug is given as an infusion into a vein. It is administered in a hospital or clinic by a specially trained health care professional. Talk to your pediatrician regarding the use of  this medicine in children. Special care may be needed. Overdosage: If you think you have taken too much of this medicine contact a poison control center or emergency room at once. NOTE: This medicine is only for you. Do not share this medicine with others. What if I miss a dose? It is important not to miss a dose. Call your doctor or health care professional if you are unable to keep an appointment. What may interact with this medicine? -medicines to increase blood counts like filgrastim, pegfilgrastim, sargramostim -probenecid -some antibiotics like amikacin, gentamicin, neomycin, polymyxin B, streptomycin, tobramycin -zalcitabine Talk to your doctor or health care professional before taking any of these medicines: -acetaminophen -aspirin -ibuprofen -ketoprofen -naproxen This list may not describe all possible interactions. Give your health care provider a list of all the medicines, herbs, non-prescription drugs, or dietary supplements you use. Also tell them if you smoke, drink alcohol, or use illegal drugs. Some items may interact with your medicine. What should I watch for while using this medicine? Your condition will be monitored carefully while you are receiving this medicine. You will need important blood work done while you are taking this medicine. This medicine can make you more sensitive to cold. Do not drink cold drinks or use ice. Cover exposed skin before coming in contact with cold temperatures or cold objects. When out in cold weather wear warm clothing and cover your mouth and nose to warm the air that goes into your lungs. Tell your doctor if you get sensitive to the cold. This drug may make you feel generally unwell. This is not uncommon, as chemotherapy can affect healthy cells as well as cancer cells. Report any side effects. Continue your course of treatment even though you feel ill unless your doctor tells you to stop. In some cases, you may be given additional medicines  to help with side effects. Follow all directions for their use. Call your doctor or health care professional for advice if you get a fever, chills or sore throat, or other symptoms of a cold or flu. Do not treat yourself. This drug decreases your body's ability to fight infections. Try to avoid being around people who are sick. This medicine may increase your risk to bruise or bleed. Call your doctor or health care professional if you notice any unusual bleeding. Be careful brushing and flossing your teeth or using a toothpick because you may get an infection or bleed more easily. If you have any dental work done, tell your dentist you are receiving this medicine. Avoid taking products that contain aspirin, acetaminophen, ibuprofen, naproxen, or ketoprofen unless instructed by your doctor. These medicines may hide a fever. Do not become pregnant while taking this medicine. Women should inform their doctor if they wish to become pregnant or think they might be pregnant. There is a potential for serious side effects to an unborn child. Talk to your health care professional or pharmacist for more information. Do not breast-feed an infant while taking this medicine. Call your doctor or health care professional if you  get diarrhea. Do not treat yourself. What side effects may I notice from receiving this medicine? Side effects that you should report to your doctor or health care professional as soon as possible: -allergic reactions like skin rash, itching or hives, swelling of the face, lips, or tongue -low blood counts - This drug may decrease the number of white blood cells, red blood cells and platelets. You may be at increased risk for infections and bleeding. -signs of infection - fever or chills, cough, sore throat, pain or difficulty passing urine -signs of decreased platelets or bleeding - bruising, pinpoint red spots on the skin, black, tarry stools, nosebleeds -signs of decreased red blood cells -  unusually weak or tired, fainting spells, lightheadedness -breathing problems -chest pain, pressure -cough -diarrhea -jaw tightness -mouth sores -nausea and vomiting -pain, swelling, redness or irritation at the injection site -pain, tingling, numbness in the hands or feet -problems with balance, talking, walking -redness, blistering, peeling or loosening of the skin, including inside the mouth -trouble passing urine or change in the amount of urine Side effects that usually do not require medical attention (report to your doctor or health care professional if they continue or are bothersome): -changes in vision -constipation -hair loss -loss of appetite -metallic taste in the mouth or changes in taste -stomach pain This list may not describe all possible side effects. Call your doctor for medical advice about side effects. You may report side effects to FDA at 1-800-FDA-1088. Where should I keep my medicine? This drug is given in a hospital or clinic and will not be stored at home. NOTE: This sheet is a summary. It may not cover all possible information. If you have questions about this medicine, talk to your doctor, pharmacist, or health care provider.  2018 Elsevier/Gold Standard (2007-07-18 17:22:47)

## 2017-01-27 ENCOUNTER — Telehealth: Payer: Self-pay | Admitting: *Deleted

## 2017-01-27 NOTE — Telephone Encounter (Signed)
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 Spoke with patient. She states she is doing ok at this time. Reports no side effects. She is aware of appt tomorrow for 5FU pump d/c

## 2017-01-27 NOTE — Telephone Encounter (Signed)
-----   Message from Rennis Harding, RN sent at 01/27/2017  8:27 AM EST ----- Regarding: Dr. Burr Medico First Time Chemo F/U First time chemo f/u from 01/26/2017

## 2017-01-28 ENCOUNTER — Telehealth: Payer: Self-pay | Admitting: Nurse Practitioner

## 2017-01-28 ENCOUNTER — Inpatient Hospital Stay: Payer: Medicare HMO

## 2017-01-28 VITALS — BP 118/68 | HR 68 | Temp 98.2°F | Resp 18

## 2017-01-28 DIAGNOSIS — R634 Abnormal weight loss: Secondary | ICD-10-CM | POA: Diagnosis not present

## 2017-01-28 DIAGNOSIS — C20 Malignant neoplasm of rectum: Secondary | ICD-10-CM

## 2017-01-28 DIAGNOSIS — R197 Diarrhea, unspecified: Secondary | ICD-10-CM | POA: Diagnosis not present

## 2017-01-28 DIAGNOSIS — R69 Illness, unspecified: Secondary | ICD-10-CM | POA: Diagnosis not present

## 2017-01-28 DIAGNOSIS — R630 Anorexia: Secondary | ICD-10-CM | POA: Diagnosis not present

## 2017-01-28 DIAGNOSIS — Z808 Family history of malignant neoplasm of other organs or systems: Secondary | ICD-10-CM | POA: Diagnosis not present

## 2017-01-28 DIAGNOSIS — Z5111 Encounter for antineoplastic chemotherapy: Secondary | ICD-10-CM | POA: Diagnosis not present

## 2017-01-28 DIAGNOSIS — R530 Neoplastic (malignant) related fatigue: Secondary | ICD-10-CM | POA: Diagnosis not present

## 2017-01-28 DIAGNOSIS — E876 Hypokalemia: Secondary | ICD-10-CM | POA: Diagnosis not present

## 2017-01-28 DIAGNOSIS — D500 Iron deficiency anemia secondary to blood loss (chronic): Secondary | ICD-10-CM | POA: Diagnosis not present

## 2017-01-28 MED ORDER — HEPARIN SOD (PORK) LOCK FLUSH 100 UNIT/ML IV SOLN
500.0000 [IU] | Freq: Once | INTRAVENOUS | Status: AC | PRN
  Administered 2017-01-28: 500 [IU]
  Filled 2017-01-28: qty 5

## 2017-01-28 MED ORDER — SODIUM CHLORIDE 0.9% FLUSH
10.0000 mL | INTRAVENOUS | Status: DC | PRN
  Administered 2017-01-28: 10 mL
  Filled 2017-01-28: qty 10

## 2017-01-28 NOTE — Telephone Encounter (Signed)
Spoke to patient regarding voicemail left earlier today. Patient requested an earlier treatment time but due to provider availability, patients treatment will be in the afternoon.

## 2017-02-01 DIAGNOSIS — Z888 Allergy status to other drugs, medicaments and biological substances status: Secondary | ICD-10-CM | POA: Diagnosis not present

## 2017-02-01 DIAGNOSIS — Z882 Allergy status to sulfonamides status: Secondary | ICD-10-CM | POA: Diagnosis not present

## 2017-02-01 DIAGNOSIS — C20 Malignant neoplasm of rectum: Secondary | ICD-10-CM | POA: Diagnosis not present

## 2017-02-01 DIAGNOSIS — Z880 Allergy status to penicillin: Secondary | ICD-10-CM | POA: Diagnosis not present

## 2017-02-04 ENCOUNTER — Other Ambulatory Visit: Payer: Self-pay | Admitting: Hematology

## 2017-02-07 NOTE — Progress Notes (Signed)
[place]  [phone] [phone]  Clinic Follow up Note   Patient Care Team: Lucille Passy, MD as PCP - General (Family Medicine) Irene Shipper, MD as Consulting Physician (Gastroenterology) Leighton Ruff, MD as Consulting Physician (General Surgery) Truitt Merle, MD as Consulting Physician (Hematology) Kyung Rudd, MD as Consulting Physician (Radiation Oncology) 02/08/2017  CHIEF COMPLAINT: Follow up rectal adenocarcinoma  SUMMARY OF ONCOLOGIC HISTORY: Oncology History   Cancer Staging Rectal adenocarcinoma [facility]) Staging form: Colon and Rectum, AJCC 8th Edition - Clinical stage from 07/09/2016: Stage IIA (cT3, cN0, cM0) - Signed by Truitt Merle, MD on 08/16/2016       Rectal adenocarcinoma ([place])   07/09/2016 Pathology Results    Diagnosis Surgical [P], rectum mass - INVASIVE ADENOCARCINOMA. - SEE COMMENT.      07/09/2016 Procedure    Colonoscopy A non-obstructing large friable mass was found in the distal rectum. This began at approximately 1 cm above the anal verge and extended proximal for a distance of 8-10 cm. The lesion was bulky and involved approximately 75% of the luminal circumference. Multiple biopsies were taken. A few small-mouthed diverticula were found in the left colon. The exam was otherwise without abnormality on direct views. Retroflexion not performed purposely.      07/12/2016 Imaging    CT C/A/P with contrast IMPRESSION: Rectal wall thickening/mass, compatible with known rectal adenocarcinoma. No findings specific for metastatic disease in the chest, abdomen, or pelvis. 2.2 cm probable hemangioma in segment 4B, although poorly evaluated. Consider MRI abdomen with/ without multihance contrast for definitive characterization, as clinically warranted. Bilateral adrenal nodules measuring up to 1.6 cm on the right, likely reflecting benign adrenal adenomas, although technically indeterminate. If MRI abdomen is performed, these can  be definitively characterized at that time.      07/20/2016 Initial Diagnosis    Rectal adenocarcinoma ([place])      08/03/2016 Imaging    MRI AP W WO Contrast 08/03/16 IMPRESSION: 1. Large rectal mass measures 6.1 cm in length. No obstruction identified. This is compatible with at least a T3bN0M0 lesion. 2. No specific features highly suspicious for nodal metastasis or metastatic disease to the upper abdomen. 3. Indeterminate arterial phase enhancing lesion within the anterior dome of liver measures less than 1 cm. This may represent a benign liver lesions such as FNH or adenoma. Less favored with the hypervascular liver metastasis. Followup imaging at 6 months with repeat MRI of the liver is advise. 4. Bilateral adrenal adenomas      08/04/2016 - 09/16/2016 Radiation Therapy    Concurrent chemo radiation with Dr. [name]      08/04/2016 - 09/16/2016 Chemotherapy    Concurrent chemo radiation with Xeloda, 1500mg  twice daily      12/06/2016 Surgery    ULTRA LOW ANTERIOR RESECTION OF SIGMOID COLON AND RECTUM WITH COLOANAL ANASTAMOSIS AND LOOP ILEOSTOMY  by Dr. Drue Flirt at [place]  12/06/16      12/06/2016 Pathology Results    FINAL PATHOLOGIC DIAGNOSIS 12/06/16 MICROSCOPIC EXAMINATION AND DIAGNOSIS  A.ANAL CANAL, MUCOSECTOMY: Benign rectal and fibroadipose tissue. Negative for malignancy.  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 Mucinous tumor focally invades through the muscularis propria into pericolorectal tissue. Radial margin interpreted as involved by invasive carcinoma (invasive acellular mucin with rare viable tumor cells focally comes to within 0.2 mm [0.02 cm] of the inked radial soft tissue margin). Treatment effect  present, predominantly  invasive acellular mucin associated with only very rare small groups of viable tumor cells (near complete response). 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 AJCC Pathologic Stage: ypT3 pN1a. See Cancer Case Summary.  C.POSTERIOR ANAL CANAL, EXCISION: Benign rectal tissue. Negative for malignancy.       Chemotherapy     Xeldoa 1500mg  BID for 2 weeks on and 1 week off on 01/10/17 and start oxaliplatin every 3 weeks with cycle 2.       CURRENT THERAPY: Adjuvant FOLFOX q2 weeks began 01/26/17, plan for 6-8 cycles    INTERVAL HISTORY: Ms. Orrison returns for follow up as scheduled prior to cycle 2 FOLFOX. She tolerated first cycle well with mild fatigue that is improving. She has mild cold sensitivity to mouth and nose, none to hands/feet. No mucositis. Continues to have high ostomy output that fluctuates in consistently. Often watery but occasionally more formed. She empties bag q3 hours including at night which interrupts her sleep. Alternates imodium and lomotil around the clock. Good appetite, no n/v. Skin is dry without rash. Denies pain. She is feeling down about her treatments and ostomy in particular. Denies other specific complaints today.  REVIEW OF SYSTEMS:   Constitutional: Denies fevers, chills or abnormal weight loss (+) mild fatigue, improving Eyes: Denies blurriness of vision Ears, nose, mouth, throat, and face: Denies mucositis or sore throat Respiratory: Denies cough, dyspnea or wheezes Cardiovascular: Denies palpitation, chest discomfort or lower extremity swelling Gastrointestinal:  Denies nausea, vomiting, constipation, heartburn or change in bowel habits (+) high ostomy output, taking imodium and lomotil  Skin: Denies abnormal skin rashes (+) dry skin  Lymphatics:  Denies new lymphadenopathy or easy bruising Neurological:Denies numbness, tingling or new weaknesses (+) cold sensitivity to mouth and nose Behavioral/Psych: Mood is stable, no new changes  All other systems were reviewed with the patient and are negative.  MEDICAL HISTORY:  Past Medical History:  Diagnosis Date  . Coronary artery disease 07/2003  . Myocardial infarction ([place])   . Obesity 2003   s/p gastric bypass     SURGICAL HISTORY: Past Surgical History:  Procedure Laterality Date  . CHOLECYSTECTOMY  1999  . GASTRIC BYPASS  2003  . IR FLUORO GUIDE PORT INSERTION RIGHT  01/25/2017  . IR US GUIDE VASC ACCESS RIGHT  01/25/2017    I have reviewed the social history and family history with the patient and they are unchanged from previous note.  ALLERGIES:  is allergic to bactrim [sulfamethoxazole-trimethoprim]; phenergan [promethazine hcl]; sulfa antibiotics; and penicillins.  MEDICATIONS:  Current Outpatient Medications  Medication Sig Dispense Refill  . aspirin 81 MG chewable tablet Chew 81 mg by mouth daily.    . cholecalciferol (VITAMIN D) 1000 UNITS tablet Take 1,000 Units by mouth daily.      . diphenoxylate-atropine (LOMOTIL) 2.5-0.025 MG tablet TAKE 1 TO 2 TABLETS BY MOUTH FOUR TIMES DAILY AS NEEDED 60 tablet 0  . ferrous sulfate (FERROUSUL) 325 (65 FE) MG tablet Take 1 tablet (325 mg total) by mouth daily with breakfast. (Patient taking differently: Take 325 mg by mouth. Take two by mouth daily) 30 tablet 6  . ibuprofen (ADVIL,MOTRIN) 200 MG tablet Take 200 mg by mouth every 6 (six) hours as needed.    . mirtazapine (REMERON) 15 MG tablet Take 1 tablet (15 mg total) by mouth at bedtime. 30 tablet 1  . Multiple  Vitamin (MULTIVITAMIN) tablet Take 1 tablet by mouth daily.    . ondansetron (ZOFRAN) 8 MG tablet Take 1 tablet (8 mg total) by mouth every 8 (eight) hours as needed for nausea or vomiting. 20 tablet 1  . prochlorperazine (COMPAZINE) 10 MG tablet Take 1 tablet (10 mg  total) by mouth every 6 (six) hours as needed for nausea or vomiting. 30 tablet 1  . traMADol (ULTRAM) 50 MG tablet Take 1 tablet (50 mg total) by mouth every 6 (six) hours as needed. 90 tablet 0  . potassium chloride SA (K-DUR,KLOR-CON) 20 MEQ tablet Take 1 tablet (20 mEq total) by mouth 2 (two) times daily. For 5 days Then take once a day 40 tablet 1   Current Facility-Administered Medications  Medication Dose Route Frequency Provider Last Rate Last Dose  . 0.9 %  sodium chloride infusion  500 mL Intravenous Continuous Irene Shipper, MD        PHYSICAL EXAMINATION: ECOG PERFORMANCE STATUS: 1 - Symptomatic but completely ambulatory  Vitals:   02/08/17 1259  BP: 116/74  Pulse: 73  Resp: 18  Temp: 98.4 F (36.9 C)  SpO2: 100%   Filed Weights   02/08/17 1259  Weight: 127 lb (57.6 kg)    GENERAL:alert, no distress and comfortable SKIN: skin color, texture, turgor are normal, no rashes or significant lesions EYES: normal, Conjunctiva are pink and non-injected, sclera clear OROPHARYNX:no exudate, no erythema and lips, buccal mucosa, and tongue normal  NECK: supple, thyroid normal size, non-tender, without nodularity LYMPH:  no palpable cervical, supraclavicular, axillary, or inguinal lymphadenopathy LUNGS: clear to auscultation bilaterally with normal breathing effort HEART: regular rate & rhythm and no murmurs and no lower extremity edema ABDOMEN:abdomen soft, non-tender and normal bowel sounds. No hepatomegaly  Musculoskeletal:no cyanosis of digits and no clubbing  NEURO: alert & oriented x 3 with fluent speech, no focal motor/sensory deficits Rectal exam deferred  PAC without erythema  LABORATORY DATA:  I have reviewed the data as listed CBC Latest Ref Rng & Units 02/08/2017 01/26/2017 01/25/2017  WBC 3.9 - 10.3 K/uL 6.6 6.0 8.2  Hemoglobin 12.0 - 15.0 g/dL - - 12.0  Hematocrit 34.8 - 46.6 % 33.4(L) 33.4(L) 38.1  Platelets 145 - 400 K/uL 258 302 353     CMP Latest Ref Rng  & Units 02/08/2017 01/26/2017 01/19/2017  Glucose 70 - 140 mg/dL 104 81 76  BUN 7 - 26 mg/dL 14 17 17   Creatinine 0.60 - 1.10 mg/dL 0.69 0.72 0.72  Sodium 136 - 145 mmol/L 139 140 140  Potassium 3.5 - 5.1 mmol/L 4.1 4.5 4.4  Chloride 98 - 109 mmol/L 111(H) 110(H) 107  CO2 22 - 29 mmol/L 19(L) 22 26  Calcium 8.4 - 10.4 mg/dL 9.1 9.0 9.4  Total Protein 6.4 - 8.3 g/dL 6.2(L) 6.2(L) 6.6  Total Bilirubin 0.2 - 1.2 mg/dL <0.2(L) <0.2(L) <0.2(L)  Alkaline Phos 40 - 150 U/L 72 66 70  AST 5 - 34 U/L 30 22 19   ALT 0 - 55 U/L 37 20 17   PATHOLOGY:  FINAL PATHOLOGIC DIAGNOSIS 12/06/16 MICROSCOPIC EXAMINATION AND DIAGNOSIS  A.ANAL CANAL, MUCOSECTOMY: Benign rectal and fibroadipose tissue. Negative for malignancy.  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 Mucinous tumor focally invades through the muscularis propria into pericolorectal tissue. Radial margin interpreted as involved by invasive carcinoma (invasive acellular mucin with rare viable tumor cells focally comes to within 0.2 mm [  0.02 cm] of the inked radial soft tissue margin). Treatment effect present, predominantly invasive acellular mucin associated with only very rare small groups of viable tumor cells (near complete response). 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 AJCC Pathologic Stage: ypT3 pN1a. See Cancer Case Summary.  C.POSTERIOR ANAL CANAL, EXCISION: Benign rectal tissue. Negative for malignancy.    07/09/2016: Diagnosis Surgical [P], rectum mass - INVASIVE ADENOCARCINOMA.      RADIOGRAPHIC  STUDIES: I have personally reviewed the radiological images as listed and agreed with the findings in the report. No results found.   ASSESSMENT & PLAN: Jleigh Striplin is a 67 y.o. woman with past medical history of gastric bypass surgery, presented with rectal bleeding, loose bowel movement and stool incontinence for one half years, was recently diagnosed invasive rectal adenocarcinoma.  1. Invasive low rectal adenocarcinoma, cT3N0M0, ypT3N1a, near complete response, (+) margin  -Ms. Rattan appears stable today. She completed 1st cycle FOLFOX without difficulty.  -She had mild fatigue and high ostomy output, but otherwise tolerated well.  -Lasb reviewed, CBC and Cmet adequate to proceed with cycle 2 adjuvant FOLFOX on 2/6 -Plan is for her to complete 6-8 cycles in adjuvant setting, per Dr. Burr Medico -She wants to schedule her planned 8 cycles in advance so she has and end date to look forward to -Return for lab and f/u in 2 weeks with next cycle  2. Iron deficient anemia -She takes oral iron supplement 1 tab BID, s/p IV Feraheme 08/04/16 and 01/26/17 -Hgb is stable, 10.7 today; ferritin now 397 -she responded to [facility]; will monitor -Continue oral iron supplement BID  3. Genetics  -Appointment and labs pending  4. History of DM, HTN -BG and BP well controlled lately per our records -will follow closely  5. Hypokalemia -Has been off oral K supplement for several months K 4.1 today, does not require supplementation at this time  6. Anorexia, weight loss -Weight is stable lately, reports having good appetite lately  -Will monitor weight closely  -On Remeron  7. Diarrhea, loose stool -She has high ostomy output, empties bag every 3 hours -she alternates lomotil and imodium daily, encouraged her to continue with maximum doses to slow GI output -encouraged her to increase po liquid intake to prevent dehydration  8. depression -Related to her treatment and current  situation -She is not interested in support group at this time, will let us know if she changes her mind -On Remeron  PLAN:  Labs reviewed, proceed with cycle 2 FOLFOX 2/6 and continue q2 weeks Maximize imodium and lomotil for diarrhea Return in 2 weeks for lab, f/u with Dr. Burr Medico, and next cycle   All questions were answered. The patient knows to call the clinic with any problems, questions or concerns. No barriers to learning was detected.    Alla Feeling, NP 02/08/17

## 2017-02-08 ENCOUNTER — Telehealth: Payer: Self-pay | Admitting: Hematology

## 2017-02-08 ENCOUNTER — Encounter: Payer: Self-pay | Admitting: Nurse Practitioner

## 2017-02-08 ENCOUNTER — Inpatient Hospital Stay (HOSPITAL_BASED_OUTPATIENT_CLINIC_OR_DEPARTMENT_OTHER): Payer: Medicare HMO | Admitting: Nurse Practitioner

## 2017-02-08 ENCOUNTER — Inpatient Hospital Stay: Payer: Medicare HMO | Attending: Hematology

## 2017-02-08 ENCOUNTER — Inpatient Hospital Stay: Payer: Medicare HMO

## 2017-02-08 VITALS — BP 116/74 | HR 73 | Temp 98.4°F | Resp 18 | Ht 60.0 in | Wt 127.0 lb

## 2017-02-08 DIAGNOSIS — D500 Iron deficiency anemia secondary to blood loss (chronic): Secondary | ICD-10-CM

## 2017-02-08 DIAGNOSIS — R530 Neoplastic (malignant) related fatigue: Secondary | ICD-10-CM | POA: Diagnosis not present

## 2017-02-08 DIAGNOSIS — Z5111 Encounter for antineoplastic chemotherapy: Secondary | ICD-10-CM | POA: Insufficient documentation

## 2017-02-08 DIAGNOSIS — Z808 Family history of malignant neoplasm of other organs or systems: Secondary | ICD-10-CM | POA: Diagnosis not present

## 2017-02-08 DIAGNOSIS — Z8042 Family history of malignant neoplasm of prostate: Secondary | ICD-10-CM | POA: Diagnosis not present

## 2017-02-08 DIAGNOSIS — C20 Malignant neoplasm of rectum: Secondary | ICD-10-CM

## 2017-02-08 DIAGNOSIS — Z800 Family history of malignant neoplasm of digestive organs: Secondary | ICD-10-CM | POA: Insufficient documentation

## 2017-02-08 DIAGNOSIS — R69 Illness, unspecified: Secondary | ICD-10-CM | POA: Diagnosis not present

## 2017-02-08 DIAGNOSIS — D509 Iron deficiency anemia, unspecified: Secondary | ICD-10-CM | POA: Insufficient documentation

## 2017-02-08 DIAGNOSIS — K909 Intestinal malabsorption, unspecified: Secondary | ICD-10-CM

## 2017-02-08 DIAGNOSIS — Z9221 Personal history of antineoplastic chemotherapy: Secondary | ICD-10-CM | POA: Diagnosis not present

## 2017-02-08 DIAGNOSIS — R634 Abnormal weight loss: Secondary | ICD-10-CM | POA: Diagnosis not present

## 2017-02-08 DIAGNOSIS — R197 Diarrhea, unspecified: Secondary | ICD-10-CM

## 2017-02-08 DIAGNOSIS — E876 Hypokalemia: Secondary | ICD-10-CM | POA: Diagnosis not present

## 2017-02-08 DIAGNOSIS — R630 Anorexia: Secondary | ICD-10-CM | POA: Diagnosis not present

## 2017-02-08 DIAGNOSIS — F329 Major depressive disorder, single episode, unspecified: Secondary | ICD-10-CM | POA: Diagnosis not present

## 2017-02-08 DIAGNOSIS — Z923 Personal history of irradiation: Secondary | ICD-10-CM | POA: Insufficient documentation

## 2017-02-08 DIAGNOSIS — Z8049 Family history of malignant neoplasm of other genital organs: Secondary | ICD-10-CM | POA: Diagnosis not present

## 2017-02-08 LAB — CBC WITH DIFFERENTIAL (CANCER CENTER ONLY)
BASOS ABS: 0 10*3/uL (ref 0.0–0.1)
Basophils Relative: 1 %
Eosinophils Absolute: 0.2 10*3/uL (ref 0.0–0.5)
Eosinophils Relative: 3 %
HEMATOCRIT: 33.4 % — AB (ref 34.8–46.6)
Hemoglobin: 10.7 g/dL — ABNORMAL LOW (ref 11.6–15.9)
LYMPHS PCT: 8 %
Lymphs Abs: 0.5 10*3/uL — ABNORMAL LOW (ref 0.9–3.3)
MCH: 28.3 pg (ref 25.1–34.0)
MCHC: 32 g/dL (ref 31.5–36.0)
MCV: 88.4 fL (ref 79.5–101.0)
MONO ABS: 0.6 10*3/uL (ref 0.1–0.9)
Monocytes Relative: 9 %
NEUTROS ABS: 5.2 10*3/uL (ref 1.5–6.5)
NEUTROS PCT: 79 %
Platelet Count: 258 10*3/uL (ref 145–400)
RBC: 3.78 MIL/uL (ref 3.70–5.45)
RDW: 14.2 % (ref 11.2–14.5)
WBC: 6.6 10*3/uL (ref 3.9–10.3)

## 2017-02-08 LAB — COMPREHENSIVE METABOLIC PANEL
ALT: 37 U/L (ref 0–55)
ANION GAP: 9 (ref 3–11)
AST: 30 U/L (ref 5–34)
Albumin: 3 g/dL — ABNORMAL LOW (ref 3.5–5.0)
Alkaline Phosphatase: 72 U/L (ref 40–150)
BUN: 14 mg/dL (ref 7–26)
CHLORIDE: 111 mmol/L — AB (ref 98–109)
CO2: 19 mmol/L — ABNORMAL LOW (ref 22–29)
Calcium: 9.1 mg/dL (ref 8.4–10.4)
Creatinine, Ser: 0.69 mg/dL (ref 0.60–1.10)
GFR calc Af Amer: >60 mL/min (ref >60)
Glucose, Bld: 104 mg/dL (ref 70–140)
POTASSIUM: 4.1 mmol/L (ref 3.5–5.1)
Sodium: 139 mmol/L (ref 136–145)
Total Bilirubin: <0.2 mg/dL — ABNORMAL LOW (ref 0.2–1.2)
Total Protein: 6.2 g/dL — ABNORMAL LOW (ref 6.4–8.3)

## 2017-02-08 LAB — RETICULOCYTES
RBC.: 3.78 MIL/uL (ref 3.70–5.45)
RETIC COUNT ABSOLUTE: 68 10*3/uL (ref 33.7–90.7)
Retic Ct Pct: 1.8 % (ref 0.7–2.1)

## 2017-02-08 LAB — FERRITIN: FERRITIN: 397 ng/mL — AB (ref 9–269)

## 2017-02-08 MED ORDER — SODIUM CHLORIDE 0.9% FLUSH
10.0000 mL | INTRAVENOUS | Status: DC | PRN
  Administered 2017-02-08: 10 mL
  Filled 2017-02-08: qty 10

## 2017-02-08 MED ORDER — HEPARIN SOD (PORK) LOCK FLUSH 100 UNIT/ML IV SOLN
500.0000 [IU] | Freq: Once | INTRAVENOUS | Status: AC | PRN
  Administered 2017-02-08: 100 [IU]
  Filled 2017-02-08: qty 5

## 2017-02-08 NOTE — Telephone Encounter (Signed)
Gave avs and calendar for February and march °

## 2017-02-09 ENCOUNTER — Ambulatory Visit: Payer: Medicare HMO | Admitting: Nurse Practitioner

## 2017-02-09 ENCOUNTER — Other Ambulatory Visit: Payer: Medicare HMO

## 2017-02-09 ENCOUNTER — Inpatient Hospital Stay: Payer: Medicare HMO

## 2017-02-09 VITALS — BP 107/66 | HR 78 | Temp 98.7°F | Resp 16

## 2017-02-09 DIAGNOSIS — D509 Iron deficiency anemia, unspecified: Secondary | ICD-10-CM | POA: Diagnosis not present

## 2017-02-09 DIAGNOSIS — Z5111 Encounter for antineoplastic chemotherapy: Secondary | ICD-10-CM | POA: Diagnosis not present

## 2017-02-09 DIAGNOSIS — R197 Diarrhea, unspecified: Secondary | ICD-10-CM | POA: Diagnosis not present

## 2017-02-09 DIAGNOSIS — C20 Malignant neoplasm of rectum: Secondary | ICD-10-CM

## 2017-02-09 DIAGNOSIS — Z923 Personal history of irradiation: Secondary | ICD-10-CM | POA: Diagnosis not present

## 2017-02-09 DIAGNOSIS — R634 Abnormal weight loss: Secondary | ICD-10-CM | POA: Diagnosis not present

## 2017-02-09 DIAGNOSIS — E876 Hypokalemia: Secondary | ICD-10-CM | POA: Diagnosis not present

## 2017-02-09 DIAGNOSIS — R530 Neoplastic (malignant) related fatigue: Secondary | ICD-10-CM | POA: Diagnosis not present

## 2017-02-09 DIAGNOSIS — R69 Illness, unspecified: Secondary | ICD-10-CM | POA: Diagnosis not present

## 2017-02-09 DIAGNOSIS — R630 Anorexia: Secondary | ICD-10-CM | POA: Diagnosis not present

## 2017-02-09 DIAGNOSIS — C210 Malignant neoplasm of anus, unspecified: Secondary | ICD-10-CM | POA: Diagnosis not present

## 2017-02-09 MED ORDER — LEUCOVORIN CALCIUM INJECTION 350 MG
400.0000 mg/m2 | Freq: Once | INTRAVENOUS | Status: AC
  Administered 2017-02-09: 624 mg via INTRAVENOUS
  Filled 2017-02-09: qty 31.2

## 2017-02-09 MED ORDER — SODIUM CHLORIDE 0.9 % IV SOLN: 10.0000 mg | Freq: Once | INTRAVENOUS | Status: DC

## 2017-02-09 MED ORDER — PALONOSETRON HCL INJECTION 0.25 MG/5ML
INTRAVENOUS | Status: AC
  Filled 2017-02-09: qty 5

## 2017-02-09 MED ORDER — SODIUM CHLORIDE 0.9 % IV SOLN
2400.0000 mg/m2 | INTRAVENOUS | Status: DC
  Administered 2017-02-09: 3750 mg via INTRAVENOUS
  Filled 2017-02-09: qty 75

## 2017-02-09 MED ORDER — PALONOSETRON HCL INJECTION 0.25 MG/5ML
0.2500 mg | Freq: Once | INTRAVENOUS | Status: AC
  Administered 2017-02-09: 0.25 mg via INTRAVENOUS

## 2017-02-09 MED ORDER — OXALIPLATIN CHEMO INJECTION 100 MG/20ML
85.0000 mg/m2 | Freq: Once | INTRAVENOUS | Status: AC
  Administered 2017-02-09: 135 mg via INTRAVENOUS
  Filled 2017-02-09: qty 7

## 2017-02-09 MED ORDER — FLUOROURACIL CHEMO INJECTION 2.5 GM/50ML
400.0000 mg/m2 | Freq: Once | INTRAVENOUS | Status: AC
  Administered 2017-02-09: 600 mg via INTRAVENOUS
  Filled 2017-02-09: qty 12

## 2017-02-09 MED ORDER — DEXAMETHASONE SODIUM PHOSPHATE 10 MG/ML IJ SOLN
INTRAMUSCULAR | Status: AC
  Filled 2017-02-09: qty 1

## 2017-02-09 MED ORDER — DEXTROSE 5 % IV SOLN
Freq: Once | INTRAVENOUS | Status: AC
  Administered 2017-02-09: 14:00:00 via INTRAVENOUS

## 2017-02-09 MED ORDER — DEXAMETHASONE SODIUM PHOSPHATE 10 MG/ML IJ SOLN
10.0000 mg | Freq: Once | INTRAMUSCULAR | Status: AC
  Administered 2017-02-09: 10 mg via INTRAVENOUS

## 2017-02-09 NOTE — Patient Instructions (Signed)
[facility] Discharge Instructions for Patients Receiving Chemotherapy  Today you received the following chemotherapy agents: Oxaliplatin, Leucovorin, and 5FU.  To help prevent nausea and vomiting after your treatment, we encourage you to take your nausea medication as directed.   If you develop nausea and vomiting that is not controlled by your nausea medication, call the clinic.   BELOW ARE SYMPTOMS THAT SHOULD BE REPORTED IMMEDIATELY:  *FEVER GREATER THAN 100.5 F  *CHILLS WITH OR WITHOUT FEVER  NAUSEA AND VOMITING THAT IS NOT CONTROLLED WITH YOUR NAUSEA MEDICATION  *UNUSUAL SHORTNESS OF BREATH  *UNUSUAL BRUISING OR BLEEDING  TENDERNESS IN MOUTH AND THROAT WITH OR WITHOUT PRESENCE OF ULCERS  *URINARY PROBLEMS  *BOWEL PROBLEMS  UNUSUAL RASH Items with * indicate a potential emergency and should be followed up as soon as possible.  Feel free to call the clinic should you have any questions or concerns. The clinic phone number is [phone].  Please show the CHEMO ALERT CARD at check-in to the Emergency Department and triage nurse.    

## 2017-02-10 DIAGNOSIS — C20 Malignant neoplasm of rectum: Secondary | ICD-10-CM | POA: Diagnosis not present

## 2017-02-10 DIAGNOSIS — Z932 Ileostomy status: Secondary | ICD-10-CM | POA: Diagnosis not present

## 2017-02-11 ENCOUNTER — Inpatient Hospital Stay: Payer: Medicare HMO

## 2017-02-11 ENCOUNTER — Other Ambulatory Visit: Payer: Self-pay | Admitting: Hematology

## 2017-02-11 ENCOUNTER — Other Ambulatory Visit: Payer: Self-pay | Admitting: *Deleted

## 2017-02-11 VITALS — BP 118/68 | HR 72 | Temp 98.7°F | Resp 16

## 2017-02-11 DIAGNOSIS — D509 Iron deficiency anemia, unspecified: Secondary | ICD-10-CM | POA: Diagnosis not present

## 2017-02-11 DIAGNOSIS — R530 Neoplastic (malignant) related fatigue: Secondary | ICD-10-CM | POA: Diagnosis not present

## 2017-02-11 DIAGNOSIS — E876 Hypokalemia: Secondary | ICD-10-CM | POA: Diagnosis not present

## 2017-02-11 DIAGNOSIS — R197 Diarrhea, unspecified: Secondary | ICD-10-CM | POA: Diagnosis not present

## 2017-02-11 DIAGNOSIS — R634 Abnormal weight loss: Secondary | ICD-10-CM | POA: Diagnosis not present

## 2017-02-11 DIAGNOSIS — C20 Malignant neoplasm of rectum: Secondary | ICD-10-CM | POA: Diagnosis not present

## 2017-02-11 DIAGNOSIS — Z923 Personal history of irradiation: Secondary | ICD-10-CM | POA: Diagnosis not present

## 2017-02-11 DIAGNOSIS — R630 Anorexia: Secondary | ICD-10-CM | POA: Diagnosis not present

## 2017-02-11 DIAGNOSIS — Z5111 Encounter for antineoplastic chemotherapy: Secondary | ICD-10-CM | POA: Diagnosis not present

## 2017-02-11 DIAGNOSIS — R69 Illness, unspecified: Secondary | ICD-10-CM | POA: Diagnosis not present

## 2017-02-11 MED ORDER — SODIUM CHLORIDE 0.9% FLUSH
10.0000 mL | INTRAVENOUS | Status: DC | PRN
  Administered 2017-02-11: 10 mL
  Filled 2017-02-11: qty 10

## 2017-02-11 MED ORDER — DIPHENOXYLATE-ATROPINE 2.5-0.025 MG PO TABS: ORAL_TABLET | ORAL | 0 refills | Status: DC

## 2017-02-11 MED ORDER — HEPARIN SOD (PORK) LOCK FLUSH 100 UNIT/ML IV SOLN
500.0000 [IU] | Freq: Once | INTRAVENOUS | Status: AC | PRN
  Administered 2017-02-11: 500 [IU]
  Filled 2017-02-11: qty 5

## 2017-02-21 NOTE — Progress Notes (Signed)
Tonya [name]  [phone] [phone]  Clinic Follow Up Note   Patient Care Team: Lucille Passy, MD as PCP - General (Family Medicine) Irene Shipper, MD as Consulting Physician (Gastroenterology) Leighton Ruff, MD as Consulting Physician (General Surgery) Truitt Merle, MD as Consulting Physician (Hematology) Kyung Rudd, MD as Consulting Physician (Radiation Oncology)   Date of Service:  02/23/2017  CHIEF COMPLAINTS:  Follow up rectal Adenocarcinoma  Oncology History   Cancer Staging Rectal adenocarcinoma [facility]) Staging form: Colon and Rectum, AJCC 8th Edition - Clinical stage from 07/09/2016: Stage IIA (cT3, cN0, cM0) - Signed by Truitt Merle, MD on 08/16/2016       Rectal adenocarcinoma ([place])   07/09/2016 Pathology Results    Diagnosis Surgical [P], rectum mass - INVASIVE ADENOCARCINOMA. - SEE COMMENT.      07/09/2016 Procedure    Colonoscopy A non-obstructing large friable mass was found in the distal rectum. This began at approximately 1 cm above the anal verge and extended proximal for a distance of 8-10 cm. The lesion was bulky and involved approximately 75% of the luminal circumference. Multiple biopsies were taken. A few small-mouthed diverticula were found in the left colon. The exam was otherwise without abnormality on direct views. Retroflexion not performed purposely.      07/12/2016 Imaging    CT C/A/P with contrast IMPRESSION: Rectal wall thickening/mass, compatible with known rectal adenocarcinoma. No findings specific for metastatic disease in the chest, abdomen, or pelvis. 2.2 cm probable hemangioma in segment 4B, although poorly evaluated. Consider MRI abdomen with/ without multihance contrast for definitive characterization, as clinically warranted. Bilateral adrenal nodules measuring up to 1.6 cm on the right, likely reflecting benign adrenal adenomas, although technically indeterminate. If MRI abdomen is performed, these can be  definitively characterized at that time.      07/20/2016 Initial Diagnosis    Rectal adenocarcinoma ([place])      08/03/2016 Imaging    MRI AP W WO Contrast 08/03/16 IMPRESSION: 1. Large rectal mass measures 6.1 cm in length. No obstruction identified. This is compatible with at least a T3bN0M0 lesion. 2. No specific features highly suspicious for nodal metastasis or metastatic disease to the upper abdomen. 3. Indeterminate arterial phase enhancing lesion within the anterior dome of liver measures less than 1 cm. This may represent a benign liver lesions such as FNH or adenoma. Less favored with the hypervascular liver metastasis. Followup imaging at 6 months with repeat MRI of the liver is advise. 4. Bilateral adrenal adenomas      08/04/2016 - 09/16/2016 Radiation Therapy    Concurrent chemo radiation with Dr. Lisbeth [name]      08/04/2016 - 09/16/2016 Chemotherapy    Concurrent chemo radiation with Xeloda, 1525m twice daily      12/06/2016 Surgery    ULTRA LOW ANTERIOR RESECTION OF SIGMOID COLON AND RECTUM WITH COLOANAL ANASTAMOSIS AND LOOP [place]OSTOMY  [place]16      12/06/2016 Pathology Results    FINAL PATHOLOGIC DIAGNOSIS 12/06/16 MICROSCOPIC EXAMINATION AND DIAGNOSIS  A.ANAL CANAL, MUCOSECTOMY: Benign rectal and fibroadipose tissue. Negative for malignancy.  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 Mucinous tumor focally invades through the muscularis propria into pericolorectal tissue. Radial margin interpreted as involved by invasive carcinoma (invasive acellular mucin with rare viable tumor cells focally comes to within 0.2 mm [0.02 cm] of the inked radial soft tissue  margin). Treatment effect present, predominantly  invasive acellular mucin associated with only very rare small groups of viable tumor cells (near complete response). 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 AJCC Pathologic Stage: ypT3 pN1a. See Cancer Case Summary.  C.POSTERIOR ANAL CANAL, EXCISION: Benign rectal tissue. Negative for malignancy.      01/26/2017 -  Chemotherapy    Adjuvant FOLFOX q2 weeks, plan for 6-8 cycles             HISTORY OF PRESENTING ILLNESS: 07/20/16 Tonya [name] 67 y.o. female is here because of recently discovered invasive adenocarcinoma. She is accompanied by a friend today. The patient underwent colonoscopy on 07/09/16 with Dr. [name]. According to Dr. Blanch Media note from that encounter, the patient presented with complaints of rectal bleeding and iron deficiency anemia. She reports she was told she has hemorrhoids and initially was not concerned about the rectal bleeding. Her most recent prior colonoscopy was 2005. On exam, Dr. Henrene Pastor noted a non-obstructing large friable mass was found in the distal rectum. This began at approximately 1 cm above the anal verge and extended proximal for a distance of 8-10 cm. The lesion was bulky and involved approximately 75% of the luminal circumference. Biopsy of the rectal mass on 07/09/16 showed invasive adenocarcinoma.  CT C/A/P on 07/12/16 revealed rectal wall thickening/mass, compatible with known rectal adenocarcinoma.No findings specific for metastatic disease in the chest, abdomen, or pelvis. 2.2 cm probable hemangioma in segment 4B, although poorly evaluated. Consider MRI abdomen with/ without multihance contrast for definitive characterization, as clinically warranted. Bilateral adrenal nodules measuring up to 1.6 cm on the right, likely reflecting benign  adrenal adenomas, although technically indeterminate. If MRI abdomen is performed, these can be definitively characterized at that time.  On 07/19/16 the patient consulted with Dr. Leighton Ruff of [facility]. Per her note, she recommends complete metastatic workup with MRI to evaluate liver, adrenal gland, and rectum. The patient will follow up with Dr. Marcello Moores following neoadjuvant therapy to discuss surgical resection.  The patient reports she has lost 30 lbs since January. She reports ongoing fatigue, which her PCP related to iron deficiency and vitamin D deficiency. She reports occasional incontinence, and irregular bowel activity. She reports ongoing bright red rectal bleeding. She denies cramps or pain. She takes 2 iron pills daily.  CURRENT THERAPY: Adjuvant FOLFOX q2 weeks began 01/26/17, plan for 6-8 cycles     INTERVAL HISTORY:  Teddie Curd is here for a follow-up. She presents to the clinic today accompanied by her friend Juliann Pulse. She is doing okay overall. She reports she is doing well considering her condition. She still reports watery diarrhea that is unchanged from last visit. She has no solid bowel movements. She uses 4 lomotil daily. She notes her nose is very dry causing some bleeding when she blows her nose. Her weight is stable. She reports fatigue. She also states that she is no longer taking a K supplement.   On review of systems, pt denies fever, abnormal bleeding or any other complaints at this time. Pertinent positives are listed and detailed within the above HPI.   MEDICAL HISTORY:  Past Medical History:  Diagnosis Date  . Coronary artery disease 07/2003  . Myocardial infarction ([place])   . Obesity 2003   s/p gastric bypass     SURGICAL HISTORY: Past Surgical History:  Procedure Laterality Date  .  CHOLECYSTECTOMY  1999  . GASTRIC BYPASS  2003  . IR FLUORO GUIDE PORT INSERTION RIGHT  01/25/2017  . IR US GUIDE VASC ACCESS RIGHT  01/25/2017     SOCIAL HISTORY: Social History   Socioeconomic History  . Marital status: Single    Spouse name: Not on file  . Number of children: 0  . Years of education: Not on file  . Highest education level: Not on file  Social Needs  . Financial resource strain: Not on file  . Food insecurity - worry: Not on file  . Food insecurity - inability: Not on file  . Transportation needs - medical: Not on file  . Transportation needs - non-medical: Not on file  Occupational History  . Occupation: retired  Tobacco Use  . Smoking status: Never Smoker  . Smokeless tobacco: Never Used  Substance and Sexual Activity  . Alcohol use: No  . Drug use: No  . Sexual activity: Not on file  Other Topics Concern  . Not on file  Social History Narrative  . Not on file    FAMILY HISTORY: Family History  Problem Relation Age of Onset  . Heart attack Mother        d.93  . Throat cancer Mother 9  . Clotting disorder Mother   . Liver disease Mother   . Kidney disease Mother   . Heart attack Father        d.92  . Prostate cancer Father 11  . Uterine cancer Sister 14       treated with total hysterectomy and radiation  . Thyroid cancer Sister 16       treated with thyroidectomy  . Cancer Maternal Uncle        d.80s unspecified type of cancer. History of smoking.  Marland Kitchen Uterine cancer Sister 31  . Colon cancer Cousin 56       paternal first-cousin    ALLERGIES:  is allergic to bactrim [sulfamethoxazole-trimethoprim]; phenergan [promethazine hcl]; sulfa antibiotics; and penicillins.  MEDICATIONS:  Current Outpatient Medications  Medication Sig Dispense Refill  . aspirin 81 MG chewable tablet Chew 81 mg by mouth daily.    . cholecalciferol (VITAMIN D) 1000 UNITS tablet Take 1,000 Units by mouth daily.      . diphenoxylate-atropine (LOMOTIL) 2.5-0.025 MG tablet TAKE 1 TO 2 TABLETS BY MOUTH FOUR TIMES DAILY AS NEEDED 120 tablet 1  . ferrous sulfate (FERROUSUL) 325 (65 FE) MG tablet Take 1 tablet  (325 mg total) by mouth daily with breakfast. (Patient taking differently: Take 325 mg by mouth. Take two by mouth daily) 30 tablet 6  . ibuprofen (ADVIL,MOTRIN) 200 MG tablet Take 200 mg by mouth every 6 (six) hours as needed.    . mirtazapine (REMERON) 15 MG tablet Take 1 tablet (15 mg total) by mouth at bedtime. 30 tablet 1  . Multiple Vitamin (MULTIVITAMIN) tablet Take 1 tablet by mouth daily.    . ondansetron (ZOFRAN) 8 MG tablet Take 1 tablet (8 mg total) by mouth every 8 (eight) hours as needed for nausea or vomiting. 20 tablet 1  . prochlorperazine (COMPAZINE) 10 MG tablet Take 1 tablet (10 mg total) by mouth every 6 (six) hours as needed for nausea or vomiting. 30 tablet 1  . traMADol (ULTRAM) 50 MG tablet Take 1 tablet (50 mg total) by mouth every 6 (six) hours as needed. 90 tablet 0   Current Facility-Administered Medications  Medication Dose Route Frequency Provider Last Rate Last Dose  . 0.9 %  sodium chloride infusion  500 mL Intravenous Continuous Irene Shipper, MD        REVIEW OF SYSTEMS:  Constitutional: Denies fevers, chills or abnormal night sweats (+) good appetite, with change in diet (+) steady weight  (+) low energy Eyes: Denies blurriness of vision, double vision or watery eyes Ears, nose, mouth, throat, and face: Denies mucositis or sore throat Respiratory: Denies cough, dyspnea or wheezes Cardiovascular: Denies palpitation, chest discomfort or lower extremity swelling Gastrointestinal:  Denies nausea, heartburn (+) very loose stool output (+) ileostomy bag Skin: Denies abnormal skin rashes  Lymphatics: Denies new lymphadenopathy or easy bruising Neurological:Denies numbness, tingling or new weaknesses Behavioral/Psych: no new changes (+) overall down mood, depression All other systems were reviewed with the patient and are negative.  PHYSICAL EXAMINATION:  ECOG PERFORMANCE STATUS: 1 - Symptomatic but completely ambulatory Vitals:   02/23/17 0850  BP: 114/62   Pulse: (!) 58  Resp: 16  Temp: 97.8 F (36.6 C)  TempSrc: Oral  SpO2: 100%  Weight: 130 lb 14.4 oz (59.4 kg)  Height: 5' (1.524 m)    GENERAL:alert, no distress and comfortable SKIN: skin color, texture, turgor are normal, no rashes or significant lesions, Mild erythema parienal area, no skin breakdown or discharge  EYES: normal, conjunctiva are pink and non-injected, sclera clear OROPHARYNX:no exudate, no erythema and lips, buccal mucosa, and tongue normal NECK: supple, thyroid normal size, non-tender, without nodularity LYMPH:  no palpable lymphadenopathy in the cervical, axillary or inguinal LUNGS: clear to auscultation and percussion with normal breathing effort HEART: regular rate & rhythm and no murmurs and no lower extremity edema ABDOMEN:abdomen soft, non-tender, and normal bowel sounds. No organomegaly. Low abdomen (+) surgical incision healing well (+) ileostomy bag with watery stool  Musculoskeletal:no cyanosis of digits and no clubbing PSYCH: alert & oriented x 3 with fluent speech NEURO: no focal motor/sensory deficits Rectal Exam: Deferred today   LABORATORY DATA:  I have reviewed the data as listed. CBC Latest Ref Rng & Units 02/23/2017 02/08/2017 01/26/2017  WBC 3.9 - 10.3 K/uL 4.4 6.6 6.0  Hemoglobin 11.6 - 15.9 g/dL 10.5(L) - -  Hematocrit 34.8 - 46.6 % 32.6(L) 33.4(L) 33.4(L)  Platelets 145 - 400 K/uL 145 258 302   CMP Latest Ref Rng & Units 02/23/2017 02/08/2017 01/26/2017  Glucose 70 - 140 mg/dL 90 104 81  BUN 7 - 26 mg/dL _0 Creatinine 0.60 - 1.10 mg/dL 0.68 0.69 0.72  Sodium 136 - 145 mmol/L 141 139 140  Potassium 3.5 - 5.1 mmol/L 4.2 4.1 4.5  Chloride 98 - 109 mmol/L 111(H) 111(H) 110(H)  CO2 22 - 29 mmol/L 22 19(L) 22  Calcium 8.4 - 10.4 mg/dL 9.0 9.1 9.0  Total Protein 6.4 - 8.3 g/dL 5.7(L) 6.2(L) 6.2(L)  Total Bilirubin 0.2 - 1.2 mg/dL <0.2(L) <0.2(L) <0.2(L)  Alkaline Phos 40 - 150 U/L 74 72 66  AST 5 - 34 U/L 35(H) 30 22  ALT 0 - 55 U/L 41 37  20    PATHOLOGY:  FINAL PATHOLOGIC DIAGNOSIS 12/06/16 MICROSCOPIC EXAMINATION AND DIAGNOSIS  A.ANAL CANAL, MUCOSECTOMY: Benign rectal and fibroadipose tissue. Negative for malignancy.  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 Mucinous tumor focally invades through the muscularis propria into pericolorectal tissue. Radial margin interpreted as involved by invasive carcinoma (invasive acellular mucin with rare viable tumor cells focally comes to within 0.2 mm [0.02 cm] of the  inked radial soft tissue margin). Treatment effect present, predominantly invasive acellular mucin associated with only very rare small groups of viable tumor cells (near complete response). 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 AJCC Pathologic Stage: ypT3 pN1a. See Cancer Case Summary.  C.POSTERIOR ANAL CANAL, EXCISION: Benign rectal tissue. Negative for malignancy.    07/09/2016: Diagnosis Surgical [P], rectum mass - INVASIVE ADENOCARCINOMA.  RADIOGRAPHIC STUDIES: I have personally reviewed the radiological images as listed and agreed with the findings in the report. Ir US Guide Vasc Access Right  Result Date: 01/25/2017 INDICATION: History of rectal cancer, in need of durable intravenous access for chemotherapy administration. EXAM: IMPLANTED PORT A CATH PLACEMENT WITH ULTRASOUND AND FLUOROSCOPIC GUIDANCE COMPARISON:  Chest CT - 07/12/2016 MEDICATIONS: Cleocin 900 mg IV;; The antibiotic was administered within an appropriate time interval prior to skin puncture. ANESTHESIA/SEDATION:  Moderate (conscious) sedation was employed during this procedure. A total of Versed 2 mg and Fentanyl 100 mcg was administered intravenously. Moderate Sedation Time: 26 minutes. The patient's level of consciousness and vital signs were monitored continuously by radiology nursing throughout the procedure under my direct supervision. CONTRAST:  None FLUOROSCOPY TIME:  12 seconds (2 mGy) COMPLICATIONS: None immediate. PROCEDURE: The procedure, risks, benefits, and alternatives were explained to the patient. Questions regarding the procedure were encouraged and answered. The patient understands and consents to the procedure. The right neck and chest were prepped with chlorhexidine in a sterile fashion, and a sterile drape was applied covering the operative field. Maximum barrier sterile technique with sterile gowns and gloves were used for the procedure. A timeout was performed prior to the initiation of the procedure. Local anesthesia was provided with 1% lidocaine with epinephrine. After creating a small venotomy incision, a micropuncture kit was utilized to access the internal jugular vein. Real-time ultrasound guidance was utilized for vascular access including the acquisition of a permanent ultrasound image documenting patency of the accessed vessel. The microwire was utilized to measure appropriate catheter length. A subcutaneous port pocket was then created along the upper chest wall utilizing a combination of sharp and blunt dissection. The pocket was irrigated with sterile saline. A single lumen thin power injectable port was chosen for placement. The 8 Fr catheter was tunneled from the port pocket site to the venotomy incision. The port was placed in the pocket. The external catheter was trimmed to appropriate length. At the venotomy, an 8 Fr peel-away sheath was placed over a guidewire under fluoroscopic guidance. The catheter was then placed through the sheath and the sheath was removed. Final catheter  positioning was confirmed and documented with a fluoroscopic spot radiograph. The port was accessed with a Huber needle, aspirated and flushed with heparinized saline. The venotomy site was closed with an interrupted 4-0 Vicryl suture. The port pocket incision was closed with interrupted 2-0 Vicryl suture and the skin was opposed with a running subcuticular 4-0 Vicryl suture. Dermabond and Steri-strips were applied to both incisions. Dressings were placed. The patient tolerated the procedure well without immediate post procedural complication. FINDINGS: After catheter placement, the tip lies within the superior cavoatrial junction. The catheter aspirates and flushes normally and is ready for immediate use. IMPRESSION: Successful placement of a right internal jugular approach power injectable Port-A-Cath. The catheter is ready for immediate use. Electronically Signed   By: Sandi Mariscal M.D.   On: 01/25/2017 15:42   Ir Fluoro  Guide Port Insertion Right  Result Date: 01/25/2017 INDICATION: History of rectal cancer, in need of durable intravenous access for chemotherapy administration. EXAM: IMPLANTED PORT A CATH PLACEMENT WITH ULTRASOUND AND FLUOROSCOPIC GUIDANCE COMPARISON:  Chest CT - 07/12/2016 MEDICATIONS: Cleocin 900 mg IV;; The antibiotic was administered within an appropriate time interval prior to skin puncture. ANESTHESIA/SEDATION: Moderate (conscious) sedation was employed during this procedure. A total of Versed 2 mg and Fentanyl 100 mcg was administered intravenously. Moderate Sedation Time: 26 minutes. The patient's level of consciousness and vital signs were monitored continuously by radiology nursing throughout the procedure under my direct supervision. CONTRAST:  None FLUOROSCOPY TIME:  12 seconds (2 mGy) COMPLICATIONS: None immediate. PROCEDURE: The procedure, risks, benefits, and alternatives were explained to the patient. Questions regarding the procedure were encouraged and answered. The patient  understands and consents to the procedure. The right neck and chest were prepped with chlorhexidine in a sterile fashion, and a sterile drape was applied covering the operative field. Maximum barrier sterile technique with sterile gowns and gloves were used for the procedure. A timeout was performed prior to the initiation of the procedure. Local anesthesia was provided with 1% lidocaine with epinephrine. After creating a small venotomy incision, a micropuncture kit was utilized to access the internal jugular vein. Real-time ultrasound guidance was utilized for vascular access including the acquisition of a permanent ultrasound image documenting patency of the accessed vessel. The microwire was utilized to measure appropriate catheter length. A subcutaneous port pocket was then created along the upper chest wall utilizing a combination of sharp and blunt dissection. The pocket was irrigated with sterile saline. A single lumen thin power injectable port was chosen for placement. The 8 Fr catheter was tunneled from the port pocket site to the venotomy incision. The port was placed in the pocket. The external catheter was trimmed to appropriate length. At the venotomy, an 8 Fr peel-away sheath was placed over a guidewire under fluoroscopic guidance. The catheter was then placed through the sheath and the sheath was removed. Final catheter positioning was confirmed and documented with a fluoroscopic spot radiograph. The port was accessed with a Huber needle, aspirated and flushed with heparinized saline. The venotomy site was closed with an interrupted 4-0 Vicryl suture. The port pocket incision was closed with interrupted 2-0 Vicryl suture and the skin was opposed with a running subcuticular 4-0 Vicryl suture. Dermabond and Steri-strips were applied to both incisions. Dressings were placed. The patient tolerated the procedure well without immediate post procedural complication. FINDINGS: After catheter placement, the  tip lies within the superior cavoatrial junction. The catheter aspirates and flushes normally and is ready for immediate use. IMPRESSION: Successful placement of a right internal jugular approach power injectable Port-A-Cath. The catheter is ready for immediate use. Electronically Signed   By: Sandi Mariscal M.D.   On: 01/25/2017 15:42    ASSESSMENT & PLAN:  Devorah Givhan is a 67 y.o. woman with past medical history of gastric bypass surgery, presented with rectal bleeding, loose bowel movement and stool incontinence for one half years, was recently diagnosed invasive rectal adenocarcinoma.  1. Invasive low rectal adenocarcinoma, cT3N0M0, ypT3N1a, near complete response, (+) margin  -I reviewed his CT scan findings, and surgical pathology results in great details with patient and her friend.  -I discussed her abdominal and pelvic MRI scan findings, which showed a T3 N0 rectal tumor. The liver lesion is indeterminate, low possibility of metastasis. We'll continue observation, and repeat scan in 3-6 months. -Patient  agrees to proceed with concurrent chemotherapy and radiation. She completed on 09/16/16. Patient has tolerated concurrent chemotherapy and radiation very well., other than rectal pain.  -She underwent surgery on 12/06/16 with Dr. Drue Flirt at [facility]. The surgical path showed near complete response to neoadjuvant chemoradiation, but it was still a T3 lesion, and unfortunately the radial margin was positive and one out of 12 nodes was positive. She is stage III and moderate risk of distant recurrence and high risk of local recurrence   -I recommend adjuvant chemotherapy with CAPOX every 3 weeks or FOLFOX every 2 weeks for at least 2-3 months. I discussed the side effects in great detail. I gave reading material on these medications. Although I think she may tolerate FOLFOX better, she is very stressed about the pump, and see frequent visit for this treatment regimen.  After lengthy discussion, we  decided to try Xeloda and oxaliplatin first.  I plan to reduce her oxaliplatin dose for the first cycle to see if she is able to tolerate. Side effects were discussed with patient in great detail. She agrees to proceed.  -Pt appears to be very depressed and devastated by her slow recovery, colostomy bag etc, and has little interest in her life now. I also discussed her option to not proceed with adjuvant treatment. But she states she will do adjuvant chemo.  -After a lengthy discussion she agreed to proceed with chemo FOLFOX for 4 months --She has been tolerating chemotherapy overall well, main side effect of fatigue, no worsening of her diarrhea, no significant nausea or other issues. -Labs reviewed, CBC and Cmp adequate to proceed with cycle 3 adjuvant FOLFOX on today  -F/u in 2 weeks with Lacie and next cycle chemo   2. Iron Deficient Anemia  - The patient takes 2 iron supplements daily. - Her iron has been somewhat low. - She will stop taking aspirin to reduce her bleeding risk. -Repeat iron studies showed iron deficiency, she received 1 dose IV iron Feraheme in the past -Her levels are improved now, Ferritin is 272 as of 08/16/2016  -HG normal at 11.8 (01/05/17).  -Hgb is 10.5 after 2 cycles of chemo -Repeat iron studies normal today  3. Genetics  -Patient has 2 sisters who had uterine cancer, this is suspicious for lynch syndrome. -I encouraged the patient to pursue genetic testing based on her family history. This will determine if the patient has inheritable genetic syndrome, such as Lynch syndrome -The patient is in agreement and I referred her to Genetics but she has not been seen    4. History of gastric bypass surgery in 2003 -for obesity   5. History of DM and HTN -Resolved after her gastric bypass surgery. -We'll monitor her blood pressure and glucose closely during her chemotherapy treatment. -Her glucose is 90 today (02/23/17) and her BP is 114/62  6. Liver  lesion -Indeterminate, no high suspicion for metastatic disease, plan to repeat liver MRI when she completes chemo   7. Hypokalemia  - Low and will recheck next week. - I advised her to eat foods that are high in potassium -I previously prescribed potassium chloride 20 meq daily -improved now, potassium at 4.4 (01/05/17) and (01/19/17) -She is no longer taking K supplement. K is normal as of 02/23/17  8. Anorexia, Weight loss -She has loss weight since surgery as she has not been eating much  -I strongly encouraged her to take ensure boost or carnation breakfast  -I will set up a consultation with  our dietitian  -Her weight loss has slowed down. I advised her to increase her ensure boost to 2 a day.  -appetite overall much improved overall, gained a few lbs   9. Diarrhea, loose stool  -She has imodium at home, I prescribed lomotil to slow down her BMs -I advised her to increase her lomotil up to 6-8 times a day as needed.  --I suggested her to use imodium in conjunction with lomotil to help with diarrhea. She can take OTC medication for gas -I refilled lomotil today (02/23/17)   10. Depression -I discussed as this is a hard time for her post surgery she can talk to someone at our clinic. I recommend her to see our Education officer, museum for counseling, she agreed.  -I prescribed Mirtazapine on (12/22/16) as this can also help her appetite and sleep, she can start at half dose to see if she can tolerate.  -She is doing better on Remoran, will continue   PLAN: Labs reviewed, proceed with cycle 3 [id number] and continue q2 weeks Maximize imodium and lomotil for diarrhea Return in 2 weeks for lab, f/u with Lacie, and next cycle chemo    No orders of the defined types were placed in this encounter.  All questions were answered. The patient knows to call the clinic with any problems, questions or concerns.  I spent 20 minutes counseling the patient face to face. The total time spent in the  appointment was 25 minutes and more than 50% was on counseling.  This document serves as a record of services personally performed by Truitt Merle, MD. It was created on her behalf by Theresia Bough, a trained medical scribe. The creation of this record is based on the scribe's personal observations and the provider's statements to them.   I have reviewed the above documentation for accuracy and completeness, and I agree with the above.     Truitt Merle, MD 02/23/2017 9:48 AM

## 2017-02-23 ENCOUNTER — Encounter: Payer: Self-pay | Admitting: Hematology

## 2017-02-23 ENCOUNTER — Inpatient Hospital Stay: Payer: Medicare HMO

## 2017-02-23 ENCOUNTER — Other Ambulatory Visit: Payer: Medicare HMO

## 2017-02-23 ENCOUNTER — Inpatient Hospital Stay (HOSPITAL_BASED_OUTPATIENT_CLINIC_OR_DEPARTMENT_OTHER): Payer: Medicare HMO | Admitting: Hematology

## 2017-02-23 ENCOUNTER — Ambulatory Visit: Payer: Medicare HMO

## 2017-02-23 VITALS — BP 114/62 | HR 58 | Temp 97.8°F | Resp 16 | Ht 60.0 in | Wt 130.9 lb

## 2017-02-23 DIAGNOSIS — Z5111 Encounter for antineoplastic chemotherapy: Secondary | ICD-10-CM | POA: Diagnosis not present

## 2017-02-23 DIAGNOSIS — Z9221 Personal history of antineoplastic chemotherapy: Secondary | ICD-10-CM

## 2017-02-23 DIAGNOSIS — Z923 Personal history of irradiation: Secondary | ICD-10-CM

## 2017-02-23 DIAGNOSIS — R530 Neoplastic (malignant) related fatigue: Secondary | ICD-10-CM

## 2017-02-23 DIAGNOSIS — R630 Anorexia: Secondary | ICD-10-CM

## 2017-02-23 DIAGNOSIS — D500 Iron deficiency anemia secondary to blood loss (chronic): Secondary | ICD-10-CM

## 2017-02-23 DIAGNOSIS — K909 Intestinal malabsorption, unspecified: Secondary | ICD-10-CM

## 2017-02-23 DIAGNOSIS — Z808 Family history of malignant neoplasm of other organs or systems: Secondary | ICD-10-CM

## 2017-02-23 DIAGNOSIS — E876 Hypokalemia: Secondary | ICD-10-CM | POA: Diagnosis not present

## 2017-02-23 DIAGNOSIS — Z8042 Family history of malignant neoplasm of prostate: Secondary | ICD-10-CM

## 2017-02-23 DIAGNOSIS — R634 Abnormal weight loss: Secondary | ICD-10-CM | POA: Diagnosis not present

## 2017-02-23 DIAGNOSIS — Z8049 Family history of malignant neoplasm of other genital organs: Secondary | ICD-10-CM | POA: Diagnosis not present

## 2017-02-23 DIAGNOSIS — C210 Malignant neoplasm of anus, unspecified: Secondary | ICD-10-CM | POA: Diagnosis not present

## 2017-02-23 DIAGNOSIS — R69 Illness, unspecified: Secondary | ICD-10-CM | POA: Diagnosis not present

## 2017-02-23 DIAGNOSIS — C20 Malignant neoplasm of rectum: Secondary | ICD-10-CM | POA: Diagnosis not present

## 2017-02-23 DIAGNOSIS — Z800 Family history of malignant neoplasm of digestive organs: Secondary | ICD-10-CM

## 2017-02-23 DIAGNOSIS — R197 Diarrhea, unspecified: Secondary | ICD-10-CM | POA: Diagnosis not present

## 2017-02-23 DIAGNOSIS — F329 Major depressive disorder, single episode, unspecified: Secondary | ICD-10-CM

## 2017-02-23 DIAGNOSIS — D509 Iron deficiency anemia, unspecified: Secondary | ICD-10-CM | POA: Diagnosis not present

## 2017-02-23 LAB — CBC WITH DIFFERENTIAL/PLATELET
BASOS ABS: 0 10*3/uL (ref 0.0–0.1)
BASOS PCT: 1 %
Eosinophils Absolute: 0.8 10*3/uL — ABNORMAL HIGH (ref 0.0–0.5)
Eosinophils Relative: 18 %
HEMATOCRIT: 32.6 % — AB (ref 34.8–46.6)
Hemoglobin: 10.5 g/dL — ABNORMAL LOW (ref 11.6–15.9)
Lymphocytes Relative: 11 %
Lymphs Abs: 0.5 10*3/uL — ABNORMAL LOW (ref 0.9–3.3)
MCH: 28.8 pg (ref 25.1–34.0)
MCHC: 32.2 g/dL (ref 31.5–36.0)
MCV: 89.3 fL (ref 79.5–101.0)
MONO ABS: 0.6 10*3/uL (ref 0.1–0.9)
Monocytes Relative: 13 %
NEUTROS ABS: 2.5 10*3/uL (ref 1.5–6.5)
Neutrophils Relative %: 57 %
PLATELETS: 145 10*3/uL (ref 145–400)
RBC: 3.65 MIL/uL — ABNORMAL LOW (ref 3.70–5.45)
RDW: 15.3 % — AB (ref 11.2–14.5)
WBC: 4.4 10*3/uL (ref 3.9–10.3)

## 2017-02-23 LAB — COMPREHENSIVE METABOLIC PANEL
ALBUMIN: 2.7 g/dL — AB (ref 3.5–5.0)
ALK PHOS: 74 U/L (ref 40–150)
ALT: 41 U/L (ref 0–55)
ANION GAP: 8 (ref 3–11)
AST: 35 U/L — AB (ref 5–34)
BUN: 11 mg/dL (ref 7–26)
CO2: 22 mmol/L (ref 22–29)
Calcium: 9 mg/dL (ref 8.4–10.4)
Chloride: 111 mmol/L — ABNORMAL HIGH (ref 98–109)
Creatinine, Ser: 0.68 mg/dL (ref 0.60–1.10)
GFR calc Af Amer: >60 mL/min (ref >60)
GFR calc non Af Amer: >60 mL/min (ref >60)
GLUCOSE: 90 mg/dL (ref 70–140)
POTASSIUM: 4.2 mmol/L (ref 3.5–5.1)
SODIUM: 141 mmol/L (ref 136–145)
Total Bilirubin: <0.2 mg/dL — ABNORMAL LOW (ref 0.2–1.2)
Total Protein: 5.7 g/dL — ABNORMAL LOW (ref 6.4–8.3)

## 2017-02-23 LAB — RETICULOCYTES
RBC.: 3.65 MIL/uL — AB (ref 3.70–5.45)
RETIC COUNT ABSOLUTE: 69.4 10*3/uL (ref 33.7–90.7)
RETIC CT PCT: 1.9 % (ref 0.7–2.1)

## 2017-02-23 LAB — IRON AND TIBC
Iron: 79 ug/dL (ref 41–142)
SATURATION RATIOS: 30 % (ref 21–57)
TIBC: 265 ug/dL (ref 236–444)
UIBC: 186 ug/dL

## 2017-02-23 LAB — FERRITIN: Ferritin: 333 ng/mL — ABNORMAL HIGH (ref 9–269)

## 2017-02-23 MED ORDER — SODIUM CHLORIDE 0.9% FLUSH
10.0000 mL | INTRAVENOUS | Status: DC | PRN
  Administered 2017-02-23: 10 mL
  Filled 2017-02-23: qty 10

## 2017-02-23 MED ORDER — LEUCOVORIN CALCIUM INJECTION 350 MG
400.0000 mg/m2 | Freq: Once | INTRAVENOUS | Status: AC
  Administered 2017-02-23: 624 mg via INTRAVENOUS
  Filled 2017-02-23: qty 31.2

## 2017-02-23 MED ORDER — DEXAMETHASONE SODIUM PHOSPHATE 10 MG/ML IJ SOLN
INTRAMUSCULAR | Status: AC
  Filled 2017-02-23: qty 1

## 2017-02-23 MED ORDER — FLUOROURACIL CHEMO INJECTION 2.5 GM/50ML
400.0000 mg/m2 | Freq: Once | INTRAVENOUS | Status: AC
  Administered 2017-02-23: 600 mg via INTRAVENOUS
  Filled 2017-02-23: qty 12

## 2017-02-23 MED ORDER — PALONOSETRON HCL INJECTION 0.25 MG/5ML
0.2500 mg | Freq: Once | INTRAVENOUS | Status: AC
  Administered 2017-02-23: 0.25 mg via INTRAVENOUS

## 2017-02-23 MED ORDER — DIPHENOXYLATE-ATROPINE 2.5-0.025 MG PO TABS: ORAL_TABLET | ORAL | 1 refills | Status: DC

## 2017-02-23 MED ORDER — OXALIPLATIN CHEMO INJECTION 100 MG/20ML
85.0000 mg/m2 | Freq: Once | INTRAVENOUS | Status: AC
  Administered 2017-02-23: 135 mg via INTRAVENOUS
  Filled 2017-02-23: qty 20

## 2017-02-23 MED ORDER — DEXAMETHASONE SODIUM PHOSPHATE 10 MG/ML IJ SOLN
10.0000 mg | Freq: Once | INTRAMUSCULAR | Status: AC
  Administered 2017-02-23: 10 mg via INTRAVENOUS

## 2017-02-23 MED ORDER — PALONOSETRON HCL INJECTION 0.25 MG/5ML
INTRAVENOUS | Status: AC
  Filled 2017-02-23: qty 5

## 2017-02-23 MED ORDER — DEXTROSE 5 % IV SOLN
Freq: Once | INTRAVENOUS | Status: AC
  Administered 2017-02-23: 10:00:00 via INTRAVENOUS

## 2017-02-23 MED ORDER — SODIUM CHLORIDE 0.9 % IV SOLN
2400.0000 mg/m2 | INTRAVENOUS | Status: DC
  Administered 2017-02-23: 3750 mg via INTRAVENOUS
  Filled 2017-02-23: qty 75

## 2017-02-23 NOTE — Patient Instructions (Signed)
[facility] Discharge Instructions for Patients Receiving Chemotherapy  Today you received the following chemotherapy agents :  Oxaliplatin,  Leucovorin,  Fluorouracil.  To help prevent nausea and vomiting after your treatment, we encourage you to take your nausea medication as prescribed.   If you develop nausea and vomiting that is not controlled by your nausea medication, call the clinic.   BELOW ARE SYMPTOMS THAT SHOULD BE REPORTED IMMEDIATELY:  *FEVER GREATER THAN 100.5 F  *CHILLS WITH OR WITHOUT FEVER  NAUSEA AND VOMITING THAT IS NOT CONTROLLED WITH YOUR NAUSEA MEDICATION  *UNUSUAL SHORTNESS OF BREATH  *UNUSUAL BRUISING OR BLEEDING  TENDERNESS IN MOUTH AND THROAT WITH OR WITHOUT PRESENCE OF ULCERS  *URINARY PROBLEMS  *BOWEL PROBLEMS  UNUSUAL RASH Items with * indicate a potential emergency and should be followed up as soon as possible.  Feel free to call the clinic should you have any questions or concerns. The clinic phone number is [phone].  Please show the CHEMO ALERT CARD at check-in to the Emergency Department and triage nurse.   

## 2017-02-24 ENCOUNTER — Telehealth: Payer: Self-pay | Admitting: *Deleted

## 2017-02-24 NOTE — Telephone Encounter (Signed)
TCT patient regarding lab results from yesterday, 02/23/17.  Spoke with patient and informed her that her iron levels were good.  Pt voiced understanding.  Patient did say that she experienced some burning and pins and needles feeling in her feet last night and today. She wanted Dr. Burr Medico to be aware of that. Pt got Oxaliplatin , LV and 5 FU yesterday. She will be here tomorrow for pump d/c.

## 2017-02-25 ENCOUNTER — Inpatient Hospital Stay: Payer: Medicare HMO

## 2017-02-25 VITALS — BP 116/70 | HR 80 | Temp 98.4°F | Resp 18

## 2017-02-25 DIAGNOSIS — E876 Hypokalemia: Secondary | ICD-10-CM | POA: Diagnosis not present

## 2017-02-25 DIAGNOSIS — Z5111 Encounter for antineoplastic chemotherapy: Secondary | ICD-10-CM | POA: Diagnosis not present

## 2017-02-25 DIAGNOSIS — C20 Malignant neoplasm of rectum: Secondary | ICD-10-CM

## 2017-02-25 DIAGNOSIS — R630 Anorexia: Secondary | ICD-10-CM | POA: Diagnosis not present

## 2017-02-25 DIAGNOSIS — R530 Neoplastic (malignant) related fatigue: Secondary | ICD-10-CM | POA: Diagnosis not present

## 2017-02-25 DIAGNOSIS — R69 Illness, unspecified: Secondary | ICD-10-CM | POA: Diagnosis not present

## 2017-02-25 DIAGNOSIS — R634 Abnormal weight loss: Secondary | ICD-10-CM | POA: Diagnosis not present

## 2017-02-25 DIAGNOSIS — R197 Diarrhea, unspecified: Secondary | ICD-10-CM | POA: Diagnosis not present

## 2017-02-25 DIAGNOSIS — Z923 Personal history of irradiation: Secondary | ICD-10-CM | POA: Diagnosis not present

## 2017-02-25 DIAGNOSIS — D509 Iron deficiency anemia, unspecified: Secondary | ICD-10-CM | POA: Diagnosis not present

## 2017-02-25 MED ORDER — HEPARIN SOD (PORK) LOCK FLUSH 100 UNIT/ML IV SOLN
500.0000 [IU] | Freq: Once | INTRAVENOUS | Status: AC | PRN
  Administered 2017-02-25: 500 [IU]
  Filled 2017-02-25: qty 5

## 2017-02-25 MED ORDER — SODIUM CHLORIDE 0.9% FLUSH
10.0000 mL | INTRAVENOUS | Status: DC | PRN
  Administered 2017-02-25: 10 mL
  Filled 2017-02-25: qty 10

## 2017-02-25 NOTE — Patient Instructions (Signed)
Implanted Port Home Guide An implanted port is a type of central line that is placed under the skin. Central lines are used to provide IV access when treatment or nutrition needs to be given through a person's veins. Implanted ports are used for long-term IV access. An implanted port may be placed because:  You need IV medicine that would be irritating to the small veins in your hands or arms.  You need long-term IV medicines, such as antibiotics.  You need IV nutrition for a long period.  You need frequent blood draws for lab tests.  You need dialysis.  Implanted ports are usually placed in the chest area, but they can also be placed in the upper arm, the abdomen, or the leg. An implanted port has two main parts:  Reservoir. The reservoir is round and will appear as a small, raised area under your skin. The reservoir is the part where a needle is inserted to give medicines or draw blood.  Catheter. The catheter is a thin, flexible tube that extends from the reservoir. The catheter is placed into a large vein. Medicine that is inserted into the reservoir goes into the catheter and then into the vein.  How will I care for my incision site? Do not get the incision site wet. Bathe or shower as directed by your health care provider. How is my port accessed? Special steps must be taken to access the port:  Before the port is accessed, a numbing cream can be placed on the skin. This helps numb the skin over the port site.  Your health care provider uses a sterile technique to access the port. ? Your health care provider must put on a mask and sterile gloves. ? The skin over your port is cleaned carefully with an antiseptic and allowed to dry. ? The port is gently pinched between sterile gloves, and a needle is inserted into the port.  Only "non-coring" port needles should be used to access the port. Once the port is accessed, a blood return should be checked. This helps ensure that the port  is in the vein and is not clogged.  If your port needs to remain accessed for a constant infusion, a clear (transparent) bandage will be placed over the needle site. The bandage and needle will need to be changed every week, or as directed by your health care provider.  Keep the bandage covering the needle clean and dry. Do not get it wet. Follow your health care provider's instructions on how to take a shower or bath while the port is accessed.  If your port does not need to stay accessed, no bandage is needed over the port.  What is flushing? Flushing helps keep the port from getting clogged. Follow your health care provider's instructions on how and when to flush the port. Ports are usually flushed with saline solution or a medicine called heparin. The need for flushing will depend on how the port is used.  If the port is used for intermittent medicines or blood draws, the port will need to be flushed: ? After medicines have been given. ? After blood has been drawn. ? As part of routine maintenance.  If a constant infusion is running, the port may not need to be flushed.  How long will my port stay implanted? The port can stay in for as long as your health care provider thinks it is needed. When it is time for the port to come out, surgery will be   done to remove it. The procedure is similar to the one performed when the port was put in. When should I seek immediate medical care? When you have an implanted port, you should seek immediate medical care if:  You notice a bad smell coming from the incision site.  You have swelling, redness, or drainage at the incision site.  You have more swelling or pain at the port site or the surrounding area.  You have a fever that is not controlled with medicine.  This information is not intended to replace advice given to you by your health care provider. Make sure you discuss any questions you have with your health care provider. Document  Released: 12/21/2004 Document Revised: 05/29/2015 Document Reviewed: 08/28/2012 Elsevier Interactive Patient Education  2017 [employer]vier Inc.  

## 2017-02-26 DIAGNOSIS — C210 Malignant neoplasm of anus, unspecified: Secondary | ICD-10-CM | POA: Diagnosis not present

## 2017-02-28 ENCOUNTER — Other Ambulatory Visit: Payer: Self-pay | Admitting: Hematology

## 2017-03-08 NOTE — Progress Notes (Signed)
[place]  [phone] [phone]  Clinic Follow up Note   Patient Care Team: Lucille Passy, MD as PCP - General (Family Medicine) Irene Shipper, MD as Consulting Physician (Gastroenterology) Leighton Ruff, MD as Consulting Physician (General Surgery) Truitt Merle, MD as Consulting Physician (Hematology) Kyung Rudd, MD as Consulting Physician (Radiation Oncology) 03/09/2017  SUMMARY OF ONCOLOGIC HISTORY: Oncology History   Cancer Staging Rectal adenocarcinoma [facility]) Staging form: Colon and Rectum, AJCC 8th Edition - Clinical stage from 07/09/2016: Stage IIA (cT3, cN0, cM0) - Signed by Truitt Merle, MD on 08/16/2016       Rectal adenocarcinoma ([place])   07/09/2016 Pathology Results    Diagnosis Surgical [P], rectum mass - INVASIVE ADENOCARCINOMA. - SEE COMMENT.      07/09/2016 Procedure    Colonoscopy A non-obstructing large friable mass was found in the distal rectum. This began at approximately 1 cm above the anal verge and extended proximal for a distance of 8-10 cm. The lesion was bulky and involved approximately 75% of the luminal circumference. Multiple biopsies were taken. A few small-mouthed diverticula were found in the left colon. The exam was otherwise without abnormality on direct views. Retroflexion not performed purposely.      07/12/2016 Imaging    CT C/A/P with contrast IMPRESSION: Rectal wall thickening/mass, compatible with known rectal adenocarcinoma. No findings specific for metastatic disease in the chest, abdomen, or pelvis. 2.2 cm probable hemangioma in segment 4B, although poorly evaluated. Consider MRI abdomen with/ without multihance contrast for definitive characterization, as clinically warranted. Bilateral adrenal nodules measuring up to 1.6 cm on the right, likely reflecting benign adrenal adenomas, although technically indeterminate. If MRI abdomen is performed, these can be definitively characterized at that time.      07/20/2016 Initial Diagnosis    Rectal adenocarcinoma ([place])      08/03/2016 Imaging    MRI AP W WO Contrast 08/03/16 IMPRESSION: 1. Large rectal mass measures 6.1 cm in length. No obstruction identified. This is compatible with at least a T3bN0M0 lesion. 2. No specific features highly suspicious for nodal metastasis or metastatic disease to the upper abdomen. 3. Indeterminate arterial phase enhancing lesion within the anterior dome of liver measures less than 1 cm. This may represent a benign liver lesions such as FNH or adenoma. Less favored with the hypervascular liver metastasis. Followup imaging at 6 months with repeat MRI of the liver is advise. 4. Bilateral adrenal adenomas      08/04/2016 - 09/16/2016 Radiation Therapy    Concurrent chemo radiation with Dr. [name]      08/04/2016 - 09/16/2016 Chemotherapy    Concurrent chemo radiation with Xeloda, 1500mg  twice daily      12/06/2016 Surgery    ULTRA LOW ANTERIOR RESECTION OF SIGMOID COLON AND RECTUM WITH COLOANAL ANASTAMOSIS AND LOOP ILEOSTOMY  by Dr. Drue Flirt at [facility]  12/06/16      12/06/2016 Pathology Results    FINAL PATHOLOGIC DIAGNOSIS 12/06/16 MICROSCOPIC EXAMINATION AND DIAGNOSIS  A.ANAL CANAL, MUCOSECTOMY: Benign rectal and fibroadipose tissue. Negative for malignancy.  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 Mucinous tumor focally invades through the muscularis propria into pericolorectal tissue. Radial margin interpreted as involved by invasive carcinoma (invasive acellular mucin with rare viable tumor cells focally comes to within 0.2 mm [0.02 cm] of the inked radial soft tissue margin). Treatment effect present, predominantly invasive acellular mucin associated with  only very rare small groups of viable tumor cells (near complete response). 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 AJCC Pathologic Stage: ypT3 pN1a. See Cancer Case Summary.  C.POSTERIOR ANAL CANAL, EXCISION: Benign rectal tissue. Negative for malignancy.      01/26/2017 -  Chemotherapy    Adjuvant FOLFOX q2 weeks, plan for 6-8 cycles           CURRENT THERAPY: AdjuvantFOLFOX q2 weeks began 01/26/17, plan for 6-8 cycles  INTERVAL HISTORY: Tonya [name] returns for follow-up as scheduled prior to cycle 4 FOLFOX.  She was last treated on 02/23/2017.  During her last treatment she noticed mild tingling and burning to her feet which has been intermittent since then.  Not limiting function, balance, or gait.  No numbness or tingling in hands.  Continues to report constant diarrhea using lomotil 4-5 times per day.  No blood in GI output.  She remains very depressed over her situation and ileostomy bag she reports "I wish I had never had anything done" expresses she wants to complete adjuvant chemo.  Denies mucositis, eating and drinking well.  Has mild stable fatigue but able to complete activities as needed.  She reports frequent nose bleeding after blowing her nose occurring throughout the day and night.  She blows her nose, there is blood in the tissue and then she has clots that proceed to come out after that.  Occurs approximately every 1-2 hours during the day and approximately once at night, she is awakened by the sensation her nose is stopped up and cannot breathe and she proceeds to blow her nose and blood comes out each time.  REVIEW OF SYSTEMS:   Constitutional: Denies fevers, chills or abnormal weight loss (+) stable mild fatigue Eyes: Denies blurriness of vision Ears, nose,  mouth, throat, and face: Denies mucositis or sore throat (+) frequent bleeding with clots with blowing nose (+) dry nose Respiratory: Denies cough, dyspnea or wheezes Cardiovascular: Denies palpitation, chest discomfort or lower extremity swelling Gastrointestinal:  Denies nausea, vomiting, constipation, heartburn or change in bowel habits (+) frequent diarrhea, using lomotil 4-5 x per day Skin: Denies abnormal skin rashes Lymphatics: Denies new lymphadenopathy or easy bruising Neurological:Denies new weaknesses (+) mild intermittent tingling, burning to feet, began during last chemo; not limiting function or gait Behavioral/Psych: Mood is stable, no new changes (+) depressed  All other systems were reviewed with the patient and are negative.  MEDICAL HISTORY:  Past Medical History:  Diagnosis Date  . Coronary artery disease 07/2003  . Myocardial infarction ([place])   . Obesity 2003   s/p gastric bypass     SURGICAL HISTORY: Past Surgical History:  Procedure Laterality Date  . CHOLECYSTECTOMY  1999  . GASTRIC BYPASS  2003  . IR FLUORO GUIDE PORT INSERTION RIGHT  01/25/2017  . IR US GUIDE VASC ACCESS RIGHT  01/25/2017    I have reviewed the social history and family history with the patient and they are unchanged from previous note.  ALLERGIES:  is allergic to bactrim [sulfamethoxazole-trimethoprim]; phenergan [promethazine hcl]; sulfa antibiotics; and penicillins.  MEDICATIONS:  Current Outpatient Medications  Medication Sig Dispense Refill  . aspirin 81 MG chewable tablet Chew 81 mg by mouth daily.    . cholecalciferol (VITAMIN D) 1000 UNITS tablet Take 1,000 Units by mouth daily.      . diphenoxylate-atropine (LOMOTIL) 2.5-0.025 MG tablet TAKE 1  TO 2 TABLETS BY MOUTH FOUR TIMES DAILY AS NEEDED 120 tablet 1  . ferrous sulfate (FERROUSUL) 325 (65 FE) MG tablet Take 1 tablet (325 mg total) by mouth daily with breakfast. (Patient taking differently: Take 325 mg by mouth. Take two by  mouth daily) 30 tablet 6  . ibuprofen (ADVIL,MOTRIN) 200 MG tablet Take 200 mg by mouth every 6 (six) hours as needed.    . mirtazapine (REMERON) 15 MG tablet TAKE 1 TABLET(15 MG) BY MOUTH AT BEDTIME 30 tablet 0  . Multiple Vitamin (MULTIVITAMIN) tablet Take 1 tablet by mouth daily.    . ondansetron (ZOFRAN) 8 MG tablet Take 1 tablet (8 mg total) by mouth every 8 (eight) hours as needed for nausea or vomiting. 20 tablet 1  . prochlorperazine (COMPAZINE) 10 MG tablet Take 1 tablet (10 mg total) by mouth every 6 (six) hours as needed for nausea or vomiting. 30 tablet 1  . traMADol (ULTRAM) 50 MG tablet Take 1 tablet (50 mg total) by mouth every 6 (six) hours as needed. 90 tablet 0   Current Facility-Administered Medications  Medication Dose Route Frequency Provider Last Rate Last Dose  . 0.9 %  sodium chloride infusion  500 mL Intravenous Continuous Irene Shipper, MD       Facility-Administered Medications Ordered in Other Visits  Medication Dose Route Frequency Provider Last Rate Last Dose  . fluorouracil (ADRUCIL) 3,750 mg in sodium chloride 0.9 % 75 mL chemo infusion  2,400 mg/m2 (Treatment Plan Recorded) Intravenous 1 day or 1 dose Truitt Merle, MD   3,750 mg at 03/09/17 1401    PHYSICAL EXAMINATION: ECOG PERFORMANCE STATUS: 1 - Symptomatic but completely ambulatory  Vitals:   03/09/17 1005  BP: 120/63  Pulse: 73  Resp: 17  Temp: 97.7 F (36.5 C)  SpO2: 100%   Filed Weights   03/09/17 1005  Weight: 129 lb 1.6 oz (58.6 kg)    GENERAL:alert, no distress and comfortable SKIN: skin color, texture, turgor are normal, no rashes or significant lesions EYES: normal, Conjunctiva are pink and non-injected, sclera clear OROPHARYNX:no exudate, no erythema and lips, buccal mucosa, and tongue normal  LYMPH:  no palpable cervical, supraclavicular, or axillary lymphadenopathy LUNGS: clear to auscultation bilaterally with normal breathing effort HEART: regular rate & rhythm and no murmurs and  no lower extremity edema ABDOMEN:abdomen soft, non-tender and normal bowel sounds (+) vertical midline incision is well healed (+) ileostomy bag with watery stool   Musculoskeletal:no cyanosis of digits and no clubbing  NEURO: alert & oriented x 3 with fluent speech, no focal motor/sensory deficits PAC without erythema  LABORATORY DATA:  I have reviewed the data as listed CBC Latest Ref Rng & Units 03/09/2017 02/23/2017 02/08/2017  WBC 3.9 - 10.3 K/uL 3.4(L) 4.4 6.6  Hemoglobin 11.6 - 15.9 g/dL - 10.5(L) -  Hematocrit 34.8 - 46.6 % 31.7(L) 32.6(L) 33.4(L)  Platelets 145 - 400 K/uL 137(L) 145 258     CMP Latest Ref Rng & Units 03/09/2017 02/23/2017 02/08/2017  Glucose 70 - 140 mg/dL 94 90 104  BUN 7 - 26 mg/dL 11 11 14   Creatinine 0.60 - 1.10 mg/dL 0.69 0.68 0.69  Sodium 136 - 145 mmol/L 140 141 139  Potassium 3.5 - 5.1 mmol/L 4.3 4.2 4.1  Chloride 98 - 109 mmol/L 111(H) 111(H) 111(H)  CO2 22 - 29 mmol/L 24 22 19(L)  Calcium 8.4 - 10.4 mg/dL 8.9 9.0 9.1  Total Protein 6.4 - 8.3 g/dL 5.6(L) 5.7(L) 6.2(L)  Total  Bilirubin 0.2 - 1.2 mg/dL <0.2(L) <0.2(L) <0.2(L)  Alkaline Phos 40 - 150 U/L 84 74 72  AST 5 - 34 U/L 36(H) 35(H) 30  ALT 0 - 55 U/L 42 41 37   PATHOLOGY:  FINAL PATHOLOGIC DIAGNOSIS 12/06/16 MICROSCOPIC EXAMINATION AND DIAGNOSIS  A.ANAL CANAL, MUCOSECTOMY: Benign rectal and fibroadipose tissue. Negative for malignancy.  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 Mucinous tumor focally invades through the muscularis propria into pericolorectal tissue. Radial margin interpreted as involved by invasive carcinoma (invasive acellular mucin with rare viable tumor cells focally comes to within 0.2 mm [0.02 cm] of the inked radial soft tissue margin). Treatment effect  present, predominantly invasive acellular mucin associated with only very rare small groups of viable tumor cells (near complete response). 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 AJCC Pathologic Stage: ypT3 pN1a. See Cancer Case Summary.  C.POSTERIOR ANAL CANAL, EXCISION: Benign rectal tissue. Negative for malignancy.    07/09/2016: Diagnosis Surgical [P], rectum mass - INVASIVE ADENOCARCINOMA.     RADIOGRAPHIC STUDIES: I have personally reviewed the radiological images as listed and agreed with the findings in the report. No results found.   ASSESSMENT & PLAN: Tonya [name] is a 67 y.o. woman with past medical history of gastric bypass surgery, presented with rectal bleeding, loose bowel movement and stool incontinence for one half years, was recently diagnosed invasive rectal adenocarcinoma.  1. Invasive low rectal adenocarcinoma, cT3N0M0, ypT3N1a, near complete response, (+) margin  2. Iron deficient anemia 3. Genetics referral placed 07/20/16 4. History of gastric bypass surgery, 2003 5. History of DM, HTN 6. Liver lesion 7. Hypokalemia 8. Anorexia, weight loss 9. Diarrhea, loose stool 10. Depression 11. Peripheral neuropathy, G1  -Tonya [name] remains stable. She has completed 3 cycles adjuvant FOLFOX chemo, tolerating well overall except diarrhea, which is stable. She continues lomotil and imodium. VS and weight are stable. Labs reviewed, CBC and CMP adequate for treatment. S/p 2 doses IV Feraheme 08/2016 and 01/2017. She continues oral iron supplement 1 tablet daily. Today's iron studies are normal; Hgb stable. She has mild thrombocytopenia with frequent bleeding from nose; I suggest she try to avoid blowing her nose if she can. Platelets may be lower  mid-chemo cycle, but are adequate today to continue at full dose. I reviewed this with Dr. Burr Medico. Hypokalemia resolved. She developed mild intermittent tingling and burning in her feet during last chemo, neuropathy likely secondary to oxaliplatin. No functional or sensory deficit. Will monitor closely. Her mood is stable but remains depressed about the ileostomy; on Remeron. Will proceed with cycle 4 today, return in 2 weeks for f/u and cycle 5.    PLAN -Labs reviewed, proceed with cycle 4 adjuvant FOLFOX today -Return for f/u and cycle 5 in 2 weeks   All questions were answered. The patient knows to call the clinic with any problems, questions or concerns. No barriers to learning was detected. I spent 20 minutes counseling the patient face to face. The total time spent in the appointment was 25 minutes and more than 50% was on counseling and review of test results     Alla Feeling, NP 03/09/17

## 2017-03-09 ENCOUNTER — Inpatient Hospital Stay: Payer: Medicare HMO

## 2017-03-09 ENCOUNTER — Inpatient Hospital Stay (HOSPITAL_BASED_OUTPATIENT_CLINIC_OR_DEPARTMENT_OTHER): Payer: Medicare HMO | Admitting: Nurse Practitioner

## 2017-03-09 ENCOUNTER — Inpatient Hospital Stay: Payer: Medicare HMO | Attending: Hematology

## 2017-03-09 ENCOUNTER — Encounter: Payer: Self-pay | Admitting: Nurse Practitioner

## 2017-03-09 VITALS — BP 120/63 | HR 73 | Temp 97.7°F | Resp 17 | Ht 60.0 in | Wt 129.1 lb

## 2017-03-09 DIAGNOSIS — Z9221 Personal history of antineoplastic chemotherapy: Secondary | ICD-10-CM | POA: Insufficient documentation

## 2017-03-09 DIAGNOSIS — R634 Abnormal weight loss: Secondary | ICD-10-CM | POA: Diagnosis not present

## 2017-03-09 DIAGNOSIS — R197 Diarrhea, unspecified: Secondary | ICD-10-CM

## 2017-03-09 DIAGNOSIS — Z5111 Encounter for antineoplastic chemotherapy: Secondary | ICD-10-CM | POA: Insufficient documentation

## 2017-03-09 DIAGNOSIS — Z933 Colostomy status: Secondary | ICD-10-CM | POA: Diagnosis not present

## 2017-03-09 DIAGNOSIS — Z5189 Encounter for other specified aftercare: Secondary | ICD-10-CM | POA: Diagnosis not present

## 2017-03-09 DIAGNOSIS — Z8049 Family history of malignant neoplasm of other genital organs: Secondary | ICD-10-CM | POA: Diagnosis not present

## 2017-03-09 DIAGNOSIS — Z9884 Bariatric surgery status: Secondary | ICD-10-CM | POA: Insufficient documentation

## 2017-03-09 DIAGNOSIS — D696 Thrombocytopenia, unspecified: Secondary | ICD-10-CM | POA: Diagnosis not present

## 2017-03-09 DIAGNOSIS — K769 Liver disease, unspecified: Secondary | ICD-10-CM | POA: Insufficient documentation

## 2017-03-09 DIAGNOSIS — R69 Illness, unspecified: Secondary | ICD-10-CM | POA: Diagnosis not present

## 2017-03-09 DIAGNOSIS — D701 Agranulocytosis secondary to cancer chemotherapy: Secondary | ICD-10-CM | POA: Diagnosis not present

## 2017-03-09 DIAGNOSIS — C20 Malignant neoplasm of rectum: Secondary | ICD-10-CM

## 2017-03-09 DIAGNOSIS — R630 Anorexia: Secondary | ICD-10-CM | POA: Diagnosis not present

## 2017-03-09 DIAGNOSIS — D509 Iron deficiency anemia, unspecified: Secondary | ICD-10-CM | POA: Diagnosis not present

## 2017-03-09 DIAGNOSIS — C210 Malignant neoplasm of anus, unspecified: Secondary | ICD-10-CM | POA: Diagnosis not present

## 2017-03-09 DIAGNOSIS — L271 Localized skin eruption due to drugs and medicaments taken internally: Secondary | ICD-10-CM | POA: Insufficient documentation

## 2017-03-09 DIAGNOSIS — Z95828 Presence of other vascular implants and grafts: Secondary | ICD-10-CM

## 2017-03-09 DIAGNOSIS — G620 Drug-induced polyneuropathy: Secondary | ICD-10-CM | POA: Insufficient documentation

## 2017-03-09 DIAGNOSIS — K909 Intestinal malabsorption, unspecified: Secondary | ICD-10-CM

## 2017-03-09 DIAGNOSIS — Z452 Encounter for adjustment and management of vascular access device: Secondary | ICD-10-CM | POA: Diagnosis present

## 2017-03-09 DIAGNOSIS — R040 Epistaxis: Secondary | ICD-10-CM

## 2017-03-09 DIAGNOSIS — Z923 Personal history of irradiation: Secondary | ICD-10-CM | POA: Insufficient documentation

## 2017-03-09 DIAGNOSIS — D500 Iron deficiency anemia secondary to blood loss (chronic): Secondary | ICD-10-CM

## 2017-03-09 DIAGNOSIS — F329 Major depressive disorder, single episode, unspecified: Secondary | ICD-10-CM

## 2017-03-09 LAB — COMPREHENSIVE METABOLIC PANEL
ALK PHOS: 84 U/L (ref 40–150)
ALT: 42 U/L (ref 0–55)
AST: 36 U/L — AB (ref 5–34)
Albumin: 2.6 g/dL — ABNORMAL LOW (ref 3.5–5.0)
Anion gap: 5 (ref 3–11)
BUN: 11 mg/dL (ref 7–26)
CHLORIDE: 111 mmol/L — AB (ref 98–109)
CO2: 24 mmol/L (ref 22–29)
CREATININE: 0.69 mg/dL (ref 0.60–1.10)
Calcium: 8.9 mg/dL (ref 8.4–10.4)
GFR calc Af Amer: >60 mL/min (ref >60)
Glucose, Bld: 94 mg/dL (ref 70–140)
Potassium: 4.3 mmol/L (ref 3.5–5.1)
SODIUM: 140 mmol/L (ref 136–145)
Total Bilirubin: <0.2 mg/dL — ABNORMAL LOW (ref 0.2–1.2)
Total Protein: 5.6 g/dL — ABNORMAL LOW (ref 6.4–8.3)

## 2017-03-09 LAB — CBC WITH DIFFERENTIAL (CANCER CENTER ONLY)
Basophils Absolute: 0 10*3/uL (ref 0.0–0.1)
Basophils Relative: 1 %
EOS ABS: 0.8 10*3/uL — AB (ref 0.0–0.5)
EOS PCT: 22 %
HCT: 31.7 % — ABNORMAL LOW (ref 34.8–46.6)
Hemoglobin: 10.1 g/dL — ABNORMAL LOW (ref 11.6–15.9)
LYMPHS ABS: 0.4 10*3/uL — AB (ref 0.9–3.3)
Lymphocytes Relative: 12 %
MCH: 28.7 pg (ref 25.1–34.0)
MCHC: 31.9 g/dL (ref 31.5–36.0)
MCV: 90.1 fL (ref 79.5–101.0)
MONOS PCT: 14 %
Monocytes Absolute: 0.5 10*3/uL (ref 0.1–0.9)
Neutro Abs: 1.7 10*3/uL (ref 1.5–6.5)
Neutrophils Relative %: 51 %
PLATELETS: 137 10*3/uL — AB (ref 145–400)
RBC: 3.52 MIL/uL — ABNORMAL LOW (ref 3.70–5.45)
RDW: 17.2 % — AB (ref 11.2–14.5)
WBC Count: 3.4 10*3/uL — ABNORMAL LOW (ref 3.9–10.3)

## 2017-03-09 LAB — RETICULOCYTES
RBC.: 3.52 MIL/uL — ABNORMAL LOW (ref 3.70–5.45)
RETIC CT PCT: 2.3 % — AB (ref 0.7–2.1)
Retic Count, Absolute: 81 10*3/uL (ref 33.7–90.7)

## 2017-03-09 LAB — IRON AND TIBC
Iron: 80 ug/dL (ref 41–142)
Saturation Ratios: 28 % (ref 21–57)
TIBC: 290 ug/dL (ref 236–444)
UIBC: 210 ug/dL

## 2017-03-09 LAB — FERRITIN: FERRITIN: 309 ng/mL — AB (ref 9–269)

## 2017-03-09 MED ORDER — SODIUM CHLORIDE 0.9 % IV SOLN
2400.0000 mg/m2 | INTRAVENOUS | Status: DC
  Administered 2017-03-09: 3750 mg via INTRAVENOUS
  Filled 2017-03-09: qty 75

## 2017-03-09 MED ORDER — LEUCOVORIN CALCIUM INJECTION 350 MG
400.0000 mg/m2 | Freq: Once | INTRAVENOUS | Status: AC
  Administered 2017-03-09: 624 mg via INTRAVENOUS
  Filled 2017-03-09: qty 31.2

## 2017-03-09 MED ORDER — SODIUM CHLORIDE 0.9% FLUSH
10.0000 mL | INTRAVENOUS | Status: DC | PRN
  Administered 2017-03-09: 10 mL via INTRAVENOUS
  Filled 2017-03-09: qty 10

## 2017-03-09 MED ORDER — DEXAMETHASONE SODIUM PHOSPHATE 10 MG/ML IJ SOLN
10.0000 mg | Freq: Once | INTRAMUSCULAR | Status: AC
  Administered 2017-03-09: 10 mg via INTRAVENOUS

## 2017-03-09 MED ORDER — DEXAMETHASONE SODIUM PHOSPHATE 10 MG/ML IJ SOLN
INTRAMUSCULAR | Status: AC
  Filled 2017-03-09: qty 1

## 2017-03-09 MED ORDER — DEXTROSE 5 % IV SOLN
Freq: Once | INTRAVENOUS | Status: AC
  Administered 2017-03-09: 11:00:00 via INTRAVENOUS

## 2017-03-09 MED ORDER — PALONOSETRON HCL INJECTION 0.25 MG/5ML
0.2500 mg | Freq: Once | INTRAVENOUS | Status: AC
  Administered 2017-03-09: 0.25 mg via INTRAVENOUS

## 2017-03-09 MED ORDER — PALONOSETRON HCL INJECTION 0.25 MG/5ML
INTRAVENOUS | Status: AC
  Filled 2017-03-09: qty 5

## 2017-03-09 MED ORDER — OXALIPLATIN CHEMO INJECTION 100 MG/20ML
85.0000 mg/m2 | Freq: Once | INTRAVENOUS | Status: AC
  Administered 2017-03-09: 135 mg via INTRAVENOUS
  Filled 2017-03-09: qty 7

## 2017-03-09 MED ORDER — FLUOROURACIL CHEMO INJECTION 2.5 GM/50ML
400.0000 mg/m2 | Freq: Once | INTRAVENOUS | Status: AC
  Administered 2017-03-09: 600 mg via INTRAVENOUS
  Filled 2017-03-09: qty 12

## 2017-03-09 NOTE — Patient Instructions (Signed)
Cancer Center Discharge Instructions for Patients Receiving Chemotherapy  Today you received the following chemotherapy agents: Oxaliplatin, Leucovorin, and 5FU.  To help prevent nausea and vomiting after your treatment, we encourage you to take your nausea medication as directed.   If you develop nausea and vomiting that is not controlled by your nausea medication, call the clinic.   BELOW ARE SYMPTOMS THAT SHOULD BE REPORTED IMMEDIATELY:  *FEVER GREATER THAN 100.5 F  *CHILLS WITH OR WITHOUT FEVER  NAUSEA AND VOMITING THAT IS NOT CONTROLLED WITH YOUR NAUSEA MEDICATION  *UNUSUAL SHORTNESS OF BREATH  *UNUSUAL BRUISING OR BLEEDING  TENDERNESS IN MOUTH AND THROAT WITH OR WITHOUT PRESENCE OF ULCERS  *URINARY PROBLEMS  *BOWEL PROBLEMS  UNUSUAL RASH Items with * indicate a potential emergency and should be followed up as soon as possible.  Feel free to call the clinic should you have any questions or concerns. The clinic phone number is [phone].  Please show the CHEMO ALERT CARD at check-in to the Emergency Department and triage nurse.    

## 2017-03-09 NOTE — Patient Instructions (Signed)

## 2017-03-11 ENCOUNTER — Inpatient Hospital Stay: Payer: Medicare HMO

## 2017-03-11 VITALS — BP 101/56 | HR 56 | Temp 98.3°F | Resp 16

## 2017-03-11 DIAGNOSIS — C20 Malignant neoplasm of rectum: Secondary | ICD-10-CM

## 2017-03-11 DIAGNOSIS — Z5189 Encounter for other specified aftercare: Secondary | ICD-10-CM | POA: Diagnosis not present

## 2017-03-11 MED ORDER — HEPARIN SOD (PORK) LOCK FLUSH 100 UNIT/ML IV SOLN
500.0000 [IU] | Freq: Once | INTRAVENOUS | Status: AC | PRN
  Administered 2017-03-11: 500 [IU]
  Filled 2017-03-11: qty 5

## 2017-03-11 MED ORDER — SODIUM CHLORIDE 0.9% FLUSH
10.0000 mL | INTRAVENOUS | Status: DC | PRN
  Administered 2017-03-11: 10 mL
  Filled 2017-03-11: qty 10

## 2017-03-21 NOTE — Progress Notes (Signed)
[place]  [phone] [phone]) [phone]  Clinic Follow Up Note   Patient Care Team: Lucille Passy, MD as PCP - General (Family Medicine) Irene Shipper, MD as Consulting Physician (Gastroenterology) Leighton Ruff, MD as Consulting Physician (General Surgery) Truitt Merle, MD as Consulting Physician (Hematology) Kyung Rudd, MD as Consulting Physician (Radiation Oncology)   Date of Service:  03/23/2017  CHIEF COMPLAINTS:  Follow up rectal Adenocarcinoma  Oncology History   Cancer Staging Rectal adenocarcinoma [facility]) Staging form: Colon and Rectum, AJCC 8th Edition - Clinical stage from 07/09/2016: Stage IIA (cT3, cN0, cM0) - Signed by Truitt Merle, MD on 08/16/2016       Rectal adenocarcinoma ([place])   07/09/2016 Pathology Results    Diagnosis Surgical [P], rectum mass - INVASIVE ADENOCARCINOMA. - SEE COMMENT.      07/09/2016 Procedure    Colonoscopy A non-obstructing large friable mass was found in the distal rectum. This began at approximately 1 cm above the anal verge and extended proximal for a distance of 8-10 cm. The lesion was bulky and involved approximately 75% of the luminal circumference. Multiple biopsies were taken. A few small-mouthed diverticula were found in the left colon. The exam was otherwise without abnormality on direct views. Retroflexion not performed purposely.      07/12/2016 Imaging    CT C/A/P with contrast IMPRESSION: Rectal wall thickening/mass, compatible with known rectal adenocarcinoma. No findings specific for metastatic disease in the chest, abdomen, or pelvis. 2.2 cm probable hemangioma in segment 4B, although poorly evaluated. Consider MRI abdomen with/ without multihance contrast for definitive characterization, as clinically warranted. Bilateral adrenal nodules measuring up to 1.6 cm on the right, likely reflecting benign adrenal adenomas, although technically indeterminate. If MRI abdomen is performed, these can be  definitively characterized at that time.      07/20/2016 Initial Diagnosis    Rectal adenocarcinoma ([place])      08/03/2016 Imaging    MRI AP W WO Contrast 08/03/16 IMPRESSION: 1. Large rectal mass measures 6.1 cm in length. No obstruction identified. This is compatible with at least a T3bN0M0 lesion. 2. No specific features highly suspicious for nodal metastasis or metastatic disease to the upper abdomen. 3. Indeterminate arterial phase enhancing lesion within the anterior dome of liver measures less than 1 cm. This may represent a benign liver lesions such as FNH or adenoma. Less favored with the hypervascular liver metastasis. Followup imaging at 6 months with repeat MRI of the liver is advise. 4. Bilateral adrenal adenomas      08/04/2016 - 09/16/2016 Radiation Therapy    Concurrent chemo radiation with Dr. [name]      08/04/2016 - 09/16/2016 Chemotherapy    Concurrent chemo radiation with Xeloda, 1500mg  twice daily      12/06/2016 Surgery    ULTRA LOW ANTERIOR RESECTION OF SIGMOID COLON AND RECTUM WITH COLOANAL ANASTAMOSIS AND LOOP ILEOSTOMY  by Dr. Drue Flirt at [place]  12/06/16      12/06/2016 Pathology Results    FINAL PATHOLOGIC DIAGNOSIS 12/06/16 MICROSCOPIC EXAMINATION AND DIAGNOSIS  A.ANAL CANAL, MUCOSECTOMY: Benign rectal and fibroadipose tissue. Negative for malignancy.  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 Mucinous tumor focally invades through the muscularis propria into pericolorectal tissue. Radial margin interpreted as involved by invasive carcinoma (invasive acellular mucin with rare viable tumor cells focally comes to within 0.2 mm [0.02 cm] of the inked radial soft tissue  margin). Treatment effect present, predominantly  invasive acellular mucin associated with only very rare small groups of viable tumor cells (near complete response). 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 AJCC Pathologic Stage: ypT3 pN1a. See Cancer Case Summary.  C.POSTERIOR ANAL CANAL, EXCISION: Benign rectal tissue. Negative for malignancy.      01/26/2017 -  Chemotherapy    Adjuvant FOLFOX q2 weeks, plan for 6-8 cycles             HISTORY OF PRESENTING ILLNESS: 07/20/16 [name] 67 y.o. female is here because of recently discovered invasive adenocarcinoma. She is accompanied by a friend today. The patient underwent colonoscopy on 07/09/16 with Dr. [name]. According to Dr. Blanch Media note from that encounter, the patient presented with complaints of rectal bleeding and iron deficiency anemia. She reports she was told she has hemorrhoids and initially was not concerned about the rectal bleeding. Her most recent prior colonoscopy was 2005. On exam, Dr. Henrene Pastor noted a non-obstructing large friable mass was found in the distal rectum. This began at approximately 1 cm above the anal verge and extended proximal for a distance of 8-10 cm. The lesion was bulky and involved approximately 75% of the luminal circumference. Biopsy of the rectal mass on 07/09/16 showed invasive adenocarcinoma.  CT C/A/P on 07/12/16 revealed rectal wall thickening/mass, compatible with known rectal adenocarcinoma.No findings specific for metastatic disease in the chest, abdomen, or pelvis. 2.2 cm probable hemangioma in segment 4B, although poorly evaluated. Consider MRI abdomen with/ without multihance contrast for definitive characterization, as clinically warranted. Bilateral adrenal nodules measuring up to 1.6 cm on the right, likely reflecting benign  adrenal adenomas, although technically indeterminate. If MRI abdomen is performed, these can be definitively characterized at that time.  On 07/19/16 the patient consulted with Dr. Leighton Ruff of [facility]. Per her note, she recommends complete metastatic workup with MRI to evaluate liver, adrenal gland, and rectum. The patient will follow up with Dr. Marcello Moores following neoadjuvant therapy to discuss surgical resection.  The patient reports she has lost 30 lbs since January. She reports ongoing fatigue, which her PCP related to iron deficiency and vitamin D deficiency. She reports occasional incontinence, and irregular bowel activity. She reports ongoing bright red rectal bleeding. She denies cramps or pain. She takes 2 iron pills daily.  CURRENT THERAPY: Adjuvant FOLFOX q2 weeks began 01/26/17, plan for 6-8 cycles     INTERVAL HISTORY:  Tonya [name] is here for a follow-up. She presents to the clinic today by herself. She reports she is not tolerating chemo as well anymore. She states she is more nauseated then not. She endorses cold sensitivity that resolves and intermittent peripheral neuropathy. She reports skin cracking on her hands that has gotten worse even though she is using lotion. She states she also has epistaxis.   On review of systems, pt denies fever, or any other complaints at this time. Pertinent positives are listed and detailed within the above HPI.   MEDICAL HISTORY:  Past Medical History:  Diagnosis Date  . Coronary artery disease 07/2003  . Myocardial infarction ([place])   . Obesity 2003   s/p gastric bypass     SURGICAL HISTORY: Past Surgical History:  Procedure Laterality Date  . CHOLECYSTECTOMY  1999  . GASTRIC BYPASS  2003  . IR FLUORO GUIDE PORT INSERTION RIGHT  01/25/2017  . IR  US GUIDE VASC ACCESS RIGHT  01/25/2017    SOCIAL HISTORY: Social History   Socioeconomic History  . Marital status: Single    Spouse name: Not on file  . Number  of children: 0  . Years of education: Not on file  . Highest education level: Not on file  Social Needs  . Financial resource strain: Not on file  . Food insecurity - worry: Not on file  . Food insecurity - inability: Not on file  . Transportation needs - medical: Not on file  . Transportation needs - non-medical: Not on file  Occupational History  . Occupation: retired  Tobacco Use  . Smoking status: Never Smoker  . Smokeless tobacco: Never Used  Substance and Sexual Activity  . Alcohol use: No  . Drug use: No  . Sexual activity: Not on file  Other Topics Concern  . Not on file  Social History Narrative  . Not on file    FAMILY HISTORY: Family History  Problem Relation Age of Onset  . Heart attack Mother        d.93  . Throat cancer Mother 39  . Clotting disorder Mother   . Liver disease Mother   . Kidney disease Mother   . Heart attack Father        d.92  . Prostate cancer Father 35  . Uterine cancer Sister 58       treated with total hysterectomy and radiation  . Thyroid cancer Sister 64       treated with thyroidectomy  . Cancer Maternal Uncle        d.80s unspecified type of cancer. History of smoking.  Marland Kitchen Uterine cancer Sister 31  . Colon cancer Cousin 67       paternal first-cousin    ALLERGIES:  is allergic to bactrim [sulfamethoxazole-trimethoprim]; phenergan [promethazine hcl]; sulfa antibiotics; and penicillins.  MEDICATIONS:  Current Outpatient Medications  Medication Sig Dispense Refill  . aspirin 81 MG chewable tablet Chew 81 mg by mouth daily.    . cholecalciferol (VITAMIN D) 1000 UNITS tablet Take 1,000 Units by mouth daily.      . diphenoxylate-atropine (LOMOTIL) 2.5-0.025 MG tablet TAKE 1 TO 2 TABLETS BY MOUTH FOUR TIMES DAILY AS NEEDED 120 tablet 1  . ferrous sulfate (FERROUSUL) 325 (65 FE) MG tablet Take 1 tablet (325 mg total) by mouth daily with breakfast. (Patient taking differently: Take 325 mg by mouth. Take two by mouth daily) 30 tablet  6  . ibuprofen (ADVIL,MOTRIN) 200 MG tablet Take 200 mg by mouth every 6 (six) hours as needed.    . mirtazapine (REMERON) 15 MG tablet TAKE 1 TABLET(15 MG) BY MOUTH AT BEDTIME 30 tablet 0  . Multiple Vitamin (MULTIVITAMIN) tablet Take 1 tablet by mouth daily.    . ondansetron (ZOFRAN) 8 MG tablet Take 1 tablet (8 mg total) by mouth every 8 (eight) hours as needed for nausea or vomiting. 20 tablet 1  . prochlorperazine (COMPAZINE) 10 MG tablet Take 1 tablet (10 mg total) by mouth every 6 (six) hours as needed for nausea or vomiting. 30 tablet 1  . traMADol (ULTRAM) 50 MG tablet Take 1 tablet (50 mg total) by mouth every 6 (six) hours as needed. (Patient not taking: Reported on 03/23/2017) 90 tablet 0  . urea (CARMOL) 10 % cream Apply topically as needed. 71 g 1   Current Facility-Administered Medications  Medication Dose Route Frequency Provider Last Rate Last Dose  . 0.9 %  sodium chloride  infusion  500 mL Intravenous Continuous Irene Shipper, MD       Facility-Administered Medications Ordered in Other Visits  Medication Dose Route Frequency Provider Last Rate Last Dose  . fluorouracil (ADRUCIL) 3,450 mg in sodium chloride 0.9 % 81 mL chemo infusion  2,200 mg/m2 (Treatment Plan Recorded) Intravenous 1 day or 1 dose Truitt Merle, MD   3,450 mg at 03/23/17 1401  . heparin lock flush 100 unit/mL  500 Units Intracatheter Once PRN Truitt Merle, MD      . sodium chloride flush (NS) 0.9 % injection 10 mL  10 mL Intracatheter PRN Truitt Merle, MD   10 mL at 03/23/17 1401    REVIEW OF SYSTEMS:  Constitutional: Denies fevers, chills or abnormal night sweats (+) good appetite, with change in diet (+) steady weight  (+) low energy Eyes: Denies blurriness of vision, double vision or watery eyes Ears, nose, mouth, throat, and face: Denies mucositis or sore throat Respiratory: Denies cough, dyspnea or wheezes Cardiovascular: Denies palpitation, chest discomfort or lower extremity swelling Gastrointestinal:  Denies  nausea, heartburn (+) very loose stool output (+) ileostomy bag Skin: Denies abnormal skin rashes  Lymphatics: Denies new lymphadenopathy or easy bruising Neurological:Denies numbness, tingling or new weaknesses (+) cold sensitivity  Behavioral/Psych: no new changes (+) overall down mood, depression All other systems were reviewed with the patient and are negative.  PHYSICAL EXAMINATION:  ECOG PERFORMANCE STATUS: 1 - Symptomatic but completely ambulatory Vitals:   03/23/17 0952  BP: 127/79  Pulse: 84  Resp: 18  Temp: 97.9 F (36.6 C)  TempSrc: Oral  SpO2: 100%  Weight: 126 lb 14.4 oz (57.6 kg)  Height: 5' (1.524 m)    GENERAL:alert, no distress and comfortable SKIN: skin color, texture, turgor are normal, no rashes or significant lesions, Mild erythema parienal area, no skin breakdown or discharge  EYES: normal, conjunctiva are pink and non-injected, sclera clear OROPHARYNX:no exudate, no erythema and lips, buccal mucosa, and tongue normal NECK: supple, thyroid normal size, non-tender, without nodularity LYMPH:  no palpable lymphadenopathy in the cervical, axillary or inguinal LUNGS: clear to auscultation and percussion with normal breathing effort HEART: regular rate & rhythm and no murmurs and no lower extremity edema ABDOMEN:abdomen soft, non-tender, and normal bowel sounds. No organomegaly. Low abdomen (+) surgical incision healing well (+) ileostomy bag with watery stool  Musculoskeletal:no cyanosis of digits and no clubbing PSYCH: alert & oriented x 3 with fluent speech NEURO: no focal motor/sensory deficits Rectal Exam: Deferred today   LABORATORY DATA:  I have reviewed the data as listed. CBC Latest Ref Rng & Units 03/23/2017 03/09/2017 02/23/2017  WBC 3.9 - 10.3 K/uL 2.0(L) 3.4(L) 4.4  Hemoglobin 11.6 - 15.9 g/dL - - 10.5(L)  Hematocrit 34.8 - 46.6 % 32.2(L) 31.7(L) 32.6(L)  Platelets 145 - 400 K/uL 119(L) 137(L) 145   CMP Latest Ref Rng & Units 03/23/2017 03/09/2017  02/23/2017  Glucose 70 - 140 mg/dL 76 94 90  BUN 7 - 26 mg/dL 15 11 11   Creatinine 0.60 - 1.10 mg/dL 0.67 0.69 0.68  Sodium 136 - 145 mmol/L 139 140 141  Potassium 3.5 - 5.1 mmol/L 5.1 4.3 4.2  Chloride 98 - 109 mmol/L 107 111(H) 111(H)  CO2 22 - 29 mmol/L 24 24 22   Calcium 8.4 - 10.4 mg/dL 9.3 8.9 9.0  Total Protein 6.4 - 8.3 g/dL 5.9(L) 5.6(L) 5.7(L)  Total Bilirubin 0.2 - 1.2 mg/dL 0.3 <0.2(L) <0.2(L)  Alkaline Phos 40 - 150 U/L 94 84 74  AST 5 - 34 U/L 56(H) 36(H) 35(H)  ALT 0 - 55 U/L 50 42 41   ANC 0.9K TODAY   PATHOLOGY:  FINAL PATHOLOGIC DIAGNOSIS 12/06/16 MICROSCOPIC EXAMINATION AND DIAGNOSIS  A.ANAL CANAL, MUCOSECTOMY: Benign rectal and fibroadipose tissue. Negative for malignancy.  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 Mucinous tumor focally invades through the muscularis propria into pericolorectal tissue. Radial margin interpreted as involved by invasive carcinoma (invasive acellular mucin with rare viable tumor cells focally comes to within 0.2 mm [0.02 cm] of the inked radial soft tissue margin). Treatment effect present, predominantly invasive acellular mucin associated with only very rare small groups of viable tumor cells (near complete response). 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 AJCC Pathologic Stage: ypT3 pN1a. See Cancer Case Summary.  C.POSTERIOR ANAL CANAL, EXCISION: Benign rectal tissue. Negative for malignancy.    07/09/2016: Diagnosis Surgical [P], rectum mass - INVASIVE ADENOCARCINOMA.  RADIOGRAPHIC STUDIES: I have  personally reviewed the radiological images as listed and agreed with the findings in the report. No results found.  ASSESSMENT & PLAN:  Tonya [name] is a 67 y.o. woman with past medical history of gastric bypass surgery, presented with rectal bleeding, loose bowel movement and stool incontinence for one half years, was recently diagnosed invasive rectal adenocarcinoma.  1. Invasive low rectal adenocarcinoma, cT3N0M0, ypT3N1a, near complete response, (+) margin  -I reviewed his CT scan findings, and surgical pathology results in great details with patient and her friend.  -I discussed her abdominal and pelvic MRI scan findings, which showed a T3 N0 rectal tumor. The liver lesion is indeterminate, low possibility of metastasis. We'll continue observation, and repeat scan in 3-6 months. -Patient agrees to proceed with concurrent chemotherapy and radiation. She completed on 09/16/16. Patient has tolerated concurrent chemotherapy and radiation very well., other than rectal pain.  -She underwent surgery on 12/06/16 with Dr. Drue Flirt at [facility]. The surgical path showed near complete response to neoadjuvant chemoradiation, but it was still a T3 lesion, and unfortunately the radial margin was positive and one out of 12 nodes was positive. She is stage III and moderate risk of distant recurrence and high risk of local recurrence   -I recommend adjuvant chemotherapy with CAPOX every 3 weeks or FOLFOX every 2 weeks for at least 2-3 months. I previously discussed the side effects in great detail. I gave reading material on these medications. Although I think she may tolerate FOLFOX better, she is very stressed about the pump, and see frequent visit for this treatment regimen.  After lengthy discussion, we decided to try Xeloda and oxaliplatin first.  I plan to reduce her oxaliplatin dose for the first cycle to see if she is able to tolerate. Side effects were discussed with patient in great detail. She agrees  to proceed.  -Pt previously appeared to be very depressed and devastated by her slow recovery, colostomy bag etc, and has little interest in her life now. Her depression has improved since she started adjuvant chemo  --She has been tolerating chemotherapy overall well, main side effect of fatigue, hand cracking, cold sensitivity, no worsening of her diarrhea, no significant nausea or other issues. -Labs reviewed, she is neutropenic, WBC 2.0 and ANC 0.9. Pt wishes to continue with chemo. This is okay she can proceed with cycle 5  adjuvant FOLFOX today at reduced dose of pump by 10% at 2200 mg/m2 and remove 5-fu bolus. She will get Granix on 3/22 after pump DC and on 3/23.  -She has not had restaging scan since her initial staging scan in 07/2016, I recommend a surveillance CT CAP with contrast in 2 weeks  -F/u in 2 weeks with Lacie and next cycle chemo, plan for total of 8 cycles  2. Iron Deficient Anemia  - The patient takes 2 iron supplements daily. - Her iron has been somewhat low. - She will stop taking aspirin to reduce her bleeding risk. -Repeat iron studies showed iron deficiency, she received 1 dose IV iron Feraheme in the past -Her levels are improved now, Ferritin is 272 as of 08/16/2016  -Hgb is 10.6 after 4 cycles of chemo -Repeat iron studies normal previously  3. Genetics  -Patient has 2 sisters who had uterine cancer, this is suspicious for lynch syndrome. -I encouraged the patient to pursue genetic testing based on her family history. This will determine if the patient has inheritable genetic syndrome, such as Lynch syndrome -The patient is in agreement and I referred her to Genetics but she has not been seen, will refer again    4. History of gastric bypass surgery in 2003 -for obesity   5. History of DM and HTN -Resolved after her gastric bypass surgery. -We'll monitor her blood pressure and glucose closely during her chemotherapy treatment. -Her glucose is 76 today  (03/23/17) and her BP is 127/79  6. Liver lesion -Indeterminate, no high suspicion for metastatic disease, plan to repeat CT in 2 weeks   7. Anorexia, Weight loss -She has loss weight since surgery as she has not been eating much  -I strongly encouraged her to take ensure boost or carnation breakfast  -I will set up a consultation with our dietitian  -Her weight loss has slowed down. I advised her to increase her ensure boost to 2 a day.  -appetite overall much improved overall, gained a few lbs   8. Diarrhea, loose stool  -She has imodium at home, I prescribed lomotil to slow down her BMs -I advised her to increase her lomotil up to 6-8 times a day as needed.  -I suggested her to use imodium in conjunction with lomotil to help with diarrhea. She can take OTC medication for gas -I refilled lomotil on (02/23/17)  9. Depression -I discussed as this is a hard time for her post surgery she can talk to someone at our clinic. I recommend her to see our Education officer, museum for counseling, she agreed.  -I prescribed Mirtazapine on (12/22/16) as this can also help her appetite and sleep, she can start at half dose to see if she can tolerate.  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 No functional or sensory deficit. Will monitor closely.  11. Hand/Foot Syndrome -secondary to chemo -I prescribed urea cream today, she knows to use over-the-counter hydrocortisone  PLAN: -Labs reviewed, ANC 0.9 proceed with cycle 5 adjuvant FOLFOX today at reduced dose of pump by 10% at 2200 mg/m2 and remove 5-fu bolus. -Granix on 3/22 and 3/23 -prescribed Urea cream today -CT CAP with contrast in 2 weeks  -Return for f/u and cycle 6 in 2 weeks   Orders Placed This Encounter  Procedures  . CT Abdomen Pelvis W Contrast  Standing Status:   Future     Standing Expiration Date:   03/23/2018    Order Specific Question:   If indicated for the ordered procedure, I authorize the administration of contrast media per Radiology protocol    Answer:   Yes    Order Specific Question:   Preferred imaging location?    Answer:   [facility]    Order Specific Question:   Radiology Contrast Protocol - do NOT remove file path    Answer:   \\[email or web address]  . CT Chest W Contrast    Standing Status:   Future    Standing Expiration Date:   03/23/2018    Order Specific Question:   If indicated for the ordered procedure, I authorize the administration of contrast media per Radiology protocol    Answer:   Yes    Order Specific Question:   Preferred imaging location?    Answer:   [facility]    Order Specific Question:   Radiology Contrast Protocol - do NOT remove file path    Answer:   \\[email or web address]   All questions were answered. The patient knows to call the clinic with any problems, questions or concerns.  I spent 20 minutes counseling the patient face to face. The total time spent in the appointment was 25 minutes and more than 50% was on counseling.  This document serves as a record of services personally performed by Truitt Merle, MD. It was created on her behalf by Theresia Bough, a trained medical scribe. The creation of this record is based on the scribe's personal observations and the provider's statements to them.   I have reviewed the above documentation for accuracy and completeness, and I agree with the above.     Truitt Merle, MD 03/23/2017 2:38 PM

## 2017-03-23 ENCOUNTER — Telehealth: Payer: Self-pay

## 2017-03-23 ENCOUNTER — Inpatient Hospital Stay: Payer: Medicare HMO

## 2017-03-23 ENCOUNTER — Encounter: Payer: Self-pay | Admitting: Hematology

## 2017-03-23 ENCOUNTER — Inpatient Hospital Stay (HOSPITAL_BASED_OUTPATIENT_CLINIC_OR_DEPARTMENT_OTHER): Payer: Medicare HMO | Admitting: Hematology

## 2017-03-23 VITALS — BP 127/79 | HR 84 | Temp 97.9°F | Resp 18 | Ht 60.0 in | Wt 126.9 lb

## 2017-03-23 DIAGNOSIS — L271 Localized skin eruption due to drugs and medicaments taken internally: Secondary | ICD-10-CM

## 2017-03-23 DIAGNOSIS — C20 Malignant neoplasm of rectum: Secondary | ICD-10-CM

## 2017-03-23 DIAGNOSIS — D500 Iron deficiency anemia secondary to blood loss (chronic): Secondary | ICD-10-CM

## 2017-03-23 DIAGNOSIS — D701 Agranulocytosis secondary to cancer chemotherapy: Secondary | ICD-10-CM | POA: Diagnosis not present

## 2017-03-23 DIAGNOSIS — D509 Iron deficiency anemia, unspecified: Secondary | ICD-10-CM

## 2017-03-23 DIAGNOSIS — R197 Diarrhea, unspecified: Secondary | ICD-10-CM | POA: Diagnosis not present

## 2017-03-23 DIAGNOSIS — Z933 Colostomy status: Secondary | ICD-10-CM | POA: Diagnosis not present

## 2017-03-23 DIAGNOSIS — K769 Liver disease, unspecified: Secondary | ICD-10-CM | POA: Diagnosis not present

## 2017-03-23 DIAGNOSIS — Z8049 Family history of malignant neoplasm of other genital organs: Secondary | ICD-10-CM | POA: Diagnosis not present

## 2017-03-23 DIAGNOSIS — G629 Polyneuropathy, unspecified: Secondary | ICD-10-CM

## 2017-03-23 DIAGNOSIS — Z9221 Personal history of antineoplastic chemotherapy: Secondary | ICD-10-CM

## 2017-03-23 DIAGNOSIS — Z923 Personal history of irradiation: Secondary | ICD-10-CM

## 2017-03-23 DIAGNOSIS — C210 Malignant neoplasm of anus, unspecified: Secondary | ICD-10-CM | POA: Diagnosis not present

## 2017-03-23 DIAGNOSIS — Z9884 Bariatric surgery status: Secondary | ICD-10-CM | POA: Diagnosis not present

## 2017-03-23 DIAGNOSIS — Z5189 Encounter for other specified aftercare: Secondary | ICD-10-CM | POA: Diagnosis not present

## 2017-03-23 DIAGNOSIS — K909 Intestinal malabsorption, unspecified: Secondary | ICD-10-CM

## 2017-03-23 LAB — RETICULOCYTES
RBC.: 3.63 MIL/uL — AB (ref 3.70–5.45)
RETIC COUNT ABSOLUTE: 105.3 10*3/uL — AB (ref 33.7–90.7)
Retic Ct Pct: 2.9 % — ABNORMAL HIGH (ref 0.7–2.1)

## 2017-03-23 LAB — CBC WITH DIFFERENTIAL (CANCER CENTER ONLY)
BASOS ABS: 0 10*3/uL (ref 0.0–0.1)
BASOS PCT: 1 %
Eosinophils Absolute: 0.3 10*3/uL (ref 0.0–0.5)
Eosinophils Relative: 15 %
HEMATOCRIT: 32.2 % — AB (ref 34.8–46.6)
Hemoglobin: 10.6 g/dL — ABNORMAL LOW (ref 11.6–15.9)
LYMPHS PCT: 16 %
Lymphs Abs: 0.3 10*3/uL — ABNORMAL LOW (ref 0.9–3.3)
MCH: 29.2 pg (ref 25.1–34.0)
MCHC: 32.9 g/dL (ref 31.5–36.0)
MCV: 88.8 fL (ref 79.5–101.0)
Monocytes Absolute: 0.5 10*3/uL (ref 0.1–0.9)
Monocytes Relative: 25 %
NEUTROS ABS: 0.9 10*3/uL — AB (ref 1.5–6.5)
NEUTROS PCT: 43 %
Platelet Count: 119 10*3/uL — ABNORMAL LOW (ref 145–400)
RBC: 3.63 MIL/uL — ABNORMAL LOW (ref 3.70–5.45)
RDW: 21.1 % — AB (ref 11.2–14.5)
WBC Count: 2 10*3/uL — ABNORMAL LOW (ref 3.9–10.3)

## 2017-03-23 LAB — COMPREHENSIVE METABOLIC PANEL
ALK PHOS: 94 U/L (ref 40–150)
ALT: 50 U/L (ref 0–55)
ANION GAP: 8 (ref 3–11)
AST: 56 U/L — ABNORMAL HIGH (ref 5–34)
Albumin: 2.8 g/dL — ABNORMAL LOW (ref 3.5–5.0)
BUN: 15 mg/dL (ref 7–26)
CALCIUM: 9.3 mg/dL (ref 8.4–10.4)
CO2: 24 mmol/L (ref 22–29)
Chloride: 107 mmol/L (ref 98–109)
Creatinine, Ser: 0.67 mg/dL (ref 0.60–1.10)
GFR calc Af Amer: >60 mL/min (ref >60)
GFR calc non Af Amer: >60 mL/min (ref >60)
Glucose, Bld: 76 mg/dL (ref 70–140)
Potassium: 5.1 mmol/L (ref 3.5–5.1)
SODIUM: 139 mmol/L (ref 136–145)
Total Bilirubin: 0.3 mg/dL (ref 0.2–1.2)
Total Protein: 5.9 g/dL — ABNORMAL LOW (ref 6.4–8.3)

## 2017-03-23 LAB — IRON AND TIBC
Iron: 101 ug/dL (ref 41–142)
Saturation Ratios: 32 % (ref 21–57)
TIBC: 318 ug/dL (ref 236–444)
UIBC: 217 ug/dL

## 2017-03-23 LAB — FERRITIN: Ferritin: 349 ng/mL — ABNORMAL HIGH (ref 9–269)

## 2017-03-23 MED ORDER — SODIUM CHLORIDE 0.9 % IV SOLN
2200.0000 mg/m2 | INTRAVENOUS | Status: DC
  Administered 2017-03-23: 3450 mg via INTRAVENOUS
  Filled 2017-03-23: qty 69

## 2017-03-23 MED ORDER — HEPARIN SOD (PORK) LOCK FLUSH 100 UNIT/ML IV SOLN
500.0000 [IU] | Freq: Once | INTRAVENOUS | Status: DC | PRN
  Filled 2017-03-23: qty 5

## 2017-03-23 MED ORDER — SODIUM CHLORIDE 0.9% FLUSH
3.0000 mL | Freq: Once | INTRAVENOUS | Status: AC | PRN
  Administered 2017-03-23: 10 mL via INTRAVENOUS
  Filled 2017-03-23: qty 10

## 2017-03-23 MED ORDER — DEXTROSE 5 % IV SOLN
Freq: Once | INTRAVENOUS | Status: AC
  Administered 2017-03-23: 11:00:00 via INTRAVENOUS

## 2017-03-23 MED ORDER — LEUCOVORIN CALCIUM INJECTION 350 MG
400.0000 mg/m2 | Freq: Once | INTRAVENOUS | Status: AC
  Administered 2017-03-23: 624 mg via INTRAVENOUS
  Filled 2017-03-23: qty 31.2

## 2017-03-23 MED ORDER — DEXAMETHASONE SODIUM PHOSPHATE 10 MG/ML IJ SOLN
INTRAMUSCULAR | Status: AC
Start: 2017-03-23 — End: ?
  Filled 2017-03-23: qty 1

## 2017-03-23 MED ORDER — UREA 10 % EX CREA: TOPICAL_CREAM | CUTANEOUS | 1 refills | Status: DC | PRN

## 2017-03-23 MED ORDER — OXALIPLATIN CHEMO INJECTION 100 MG/20ML
85.0000 mg/m2 | Freq: Once | INTRAVENOUS | Status: AC
  Administered 2017-03-23: 135 mg via INTRAVENOUS
  Filled 2017-03-23: qty 20

## 2017-03-23 MED ORDER — PALONOSETRON HCL INJECTION 0.25 MG/5ML
INTRAVENOUS | Status: AC
  Filled 2017-03-23: qty 5

## 2017-03-23 MED ORDER — SODIUM CHLORIDE 0.9% FLUSH
10.0000 mL | INTRAVENOUS | Status: DC | PRN
  Administered 2017-03-23: 10 mL
  Filled 2017-03-23: qty 10

## 2017-03-23 MED ORDER — PALONOSETRON HCL INJECTION 0.25 MG/5ML
0.2500 mg | Freq: Once | INTRAVENOUS | Status: AC
  Administered 2017-03-23: 0.25 mg via INTRAVENOUS

## 2017-03-23 MED ORDER — DEXAMETHASONE SODIUM PHOSPHATE 10 MG/ML IJ SOLN
10.0000 mg | Freq: Once | INTRAMUSCULAR | Status: AC
  Administered 2017-03-23: 10 mg via INTRAVENOUS

## 2017-03-23 NOTE — Patient Instructions (Signed)
[facility] Discharge Instructions for Patients Receiving Chemotherapy  Today you received the following chemotherapy agents: oxaliplatin (Eloxatin), Leucovorin, and fluorouracil (Adrucil/5FU).   To help prevent nausea and vomiting after your treatment, we encourage you to take your nausea medication as directed.  If you develop nausea and vomiting that is not controlled by your nausea medication, call the clinic.   BELOW ARE SYMPTOMS THAT SHOULD BE REPORTED IMMEDIATELY:  *FEVER GREATER THAN 100.5 F  *CHILLS WITH OR WITHOUT FEVER  NAUSEA AND VOMITING THAT IS NOT CONTROLLED WITH YOUR NAUSEA MEDICATION  *UNUSUAL SHORTNESS OF BREATH  *UNUSUAL BRUISING OR BLEEDING  TENDERNESS IN MOUTH AND THROAT WITH OR WITHOUT PRESENCE OF ULCERS  *URINARY PROBLEMS  *BOWEL PROBLEMS  UNUSUAL RASH Items with * indicate a potential emergency and should be followed up as soon as possible.  Feel free to call the clinic should you have any questions or concerns. The clinic phone number is [phone].  Please show the CHEMO ALERT CARD at check-in to the Emergency Department and triage nurse.   

## 2017-03-23 NOTE — Telephone Encounter (Signed)
Printed avs and calender of upcoming appointment. Per 3/20 los 

## 2017-03-23 NOTE — Progress Notes (Signed)
Pt saw Dr. Burr Medico prior to chemo today.  OK to treat with all lab results as per md.  Pt to receive Granix on day 3 and 4 as per Dr. Burr Medico.

## 2017-03-25 ENCOUNTER — Ambulatory Visit: Payer: Medicare HMO

## 2017-03-25 ENCOUNTER — Inpatient Hospital Stay: Payer: Medicare HMO

## 2017-03-25 DIAGNOSIS — C20 Malignant neoplasm of rectum: Secondary | ICD-10-CM | POA: Diagnosis not present

## 2017-03-25 DIAGNOSIS — Z932 Ileostomy status: Secondary | ICD-10-CM | POA: Diagnosis not present

## 2017-03-25 DIAGNOSIS — D500 Iron deficiency anemia secondary to blood loss (chronic): Secondary | ICD-10-CM

## 2017-03-25 DIAGNOSIS — Z5189 Encounter for other specified aftercare: Secondary | ICD-10-CM | POA: Diagnosis not present

## 2017-03-25 DIAGNOSIS — Z95828 Presence of other vascular implants and grafts: Secondary | ICD-10-CM

## 2017-03-25 MED ORDER — FILGRASTIM 480 MCG/0.8ML IJ SOSY: 480.0000 ug | PREFILLED_SYRINGE | Freq: Once | INTRAMUSCULAR | Status: DC

## 2017-03-25 MED ORDER — TBO-FILGRASTIM 480 MCG/0.8ML ~~LOC~~ SOSY
480.0000 ug | PREFILLED_SYRINGE | Freq: Once | SUBCUTANEOUS | Status: AC
  Administered 2017-03-25: 480 ug via SUBCUTANEOUS

## 2017-03-25 MED ORDER — SODIUM CHLORIDE 0.9% FLUSH
10.0000 mL | INTRAVENOUS | Status: DC | PRN
  Administered 2017-03-25: 10 mL via INTRAVENOUS
  Filled 2017-03-25: qty 10

## 2017-03-25 MED ORDER — HEPARIN SOD (PORK) LOCK FLUSH 100 UNIT/ML IV SOLN
500.0000 [IU] | Freq: Once | INTRAVENOUS | Status: AC
  Administered 2017-03-25: 500 [IU] via INTRAVENOUS
  Filled 2017-03-25: qty 5

## 2017-03-25 MED ORDER — TBO-FILGRASTIM 480 MCG/0.8ML ~~LOC~~ SOSY
PREFILLED_SYRINGE | SUBCUTANEOUS | Status: AC
  Filled 2017-03-25: qty 0.8

## 2017-03-25 NOTE — Patient Instructions (Addendum)
Central Line, Adult A central line is a thin, flexible tube (catheter) that is put in your vein. It can be used to:  Take blood for lab tests.  Give you medicine.  Give you food and nutrients.  The procedure may vary among doctors and hospitals. Follow these instructions at home: Caring for the tube  Follow instructions from your doctor about: ? Flushing the tube with saline solution. ? Cleaning the tube and the area around it.  Only flush with clean (sterile) supplies. The supplies should be from your doctor, a pharmacy, or another place that your doctor recommends.  Before you flush the tube or clean the area around the tube: ? Wash your hands with soap and water. If you cannot use soap and water, use hand sanitizer. ? Clean the central line hub with rubbing alcohol. Caring for your skin  Keep the area where the tube was put in clean and dry.  Every day, and when changing the bandage, check the skin around the central line for: ? Redness, swelling, or pain. ? Fluid or blood. ? Warmth. ? Pus. ? A bad smell. General instructions  Keep the tube clamped, unless it is being used.  Keep your supplies in a clean, dry location.  If you or someone else accidentally pulls on the tube, make sure: ? The bandage (dressing) is okay. ? There is no bleeding. ? The tube has not been pulled out.  Do not use scissors or sharp objects near the tube.  Do not swim or let the tube soak in a tub.  Ask your doctor what activities are safe for you. Your doctor may tell you not to lift anything or move your arm too much.  Take over-the-counter and prescription medicines only as told by your doctor.  Change bandages as told by your doctor.  Keep your bandage dry. If a bandage gets wet, have it changed right away.  Keep all follow-up visits as told by your doctor. This is important. Throwing away supplies  Throw away any syringes in a trash (disposal) container that is only for sharp  items (sharps container). You can buy a sharps container from a pharmacy, or you can make one by using an empty hard plastic bottle with a cover.  Place any used bandages or infusion bags into a plastic bag. Throw that bag in the trash. Contact a doctor if:  You have any of these where the tube was put in: ? Redness, swelling, or pain. ? Fluid or blood. ? A warm feeling. ? Pus or a bad smell. Get help right away if:  You have: ? A fever. ? Chills. ? Trouble getting enough air (shortness of breath). ? Trouble breathing. ? Pain in your chest. ? Swelling in your neck, face, chest, or arm.  You are coughing.  You feel your heart beating fast or skipping beats.  You feel dizzy or you pass out (faint).  There are red lines coming from where the tube was put in.  The area where the tube was put in is bleeding and the bleeding will not stop.  Your tube is hard to flush.  You do not get a blood return from the tube.  The tube gets loose or comes out.  The tube has a hole or a tear.  The tube leaks. Summary  A central line is a thin, flexible tube (catheter) that is put in your vein. It can be used to take blood for lab tests or to   give you medicine.  Follow instructions from your doctor about flushing and cleaning the tube.  Keep the area where the tube was put in clean and dry.  Ask your doctor what activities are safe for you. This information is not intended to replace advice given to you by your health care provider. Make sure you discuss any questions you have with your health care provider. Document Released: 12/08/2011 Document Revised: 01/08/2016 Document Reviewed: 01/08/2016 Elsevier Interactive Patient Education  2017 [employer].   Filgrastim, G-CSF injection What is this medicine? FILGRASTIM, G-CSF (fil GRA stim) is a granulocyte colony-stimulating factor that stimulates the growth of neutrophils, a type of white blood cell (WBC) important in the body's  fight against infection. It is used to reduce the incidence of fever and infection in patients with certain types of cancer who are receiving chemotherapy that affects the bone marrow, to stimulate blood cell production for removal of WBCs from the body prior to a bone marrow transplantation, to reduce the incidence of fever and infection in patients who have severe chronic neutropenia, and to improve survival outcomes following high-dose radiation exposure that is toxic to the bone marrow. This medicine may be used for other purposes; ask your health care provider or pharmacist if you have questions. COMMON BRAND NAME(S): Neupogen, Zarxio What should I tell my health care provider before I take this medicine? They need to know if you have any of these conditions: -kidney disease -latex allergy -ongoing radiation therapy -sickle cell disease -an unusual or allergic reaction to filgrastim, pegfilgrastim, other medicines, foods, dyes, or preservatives -pregnant or trying to get pregnant -breast-feeding How should I use this medicine? This medicine is for injection under the skin or infusion into a vein. As an infusion into a vein, it is usually given by a health care professional in a hospital or clinic setting. If you get this medicine at home, you will be taught how to prepare and give this medicine. Refer to the Instructions for Use that come with your medication packaging. Use exactly as directed. Take your medicine at regular intervals. Do not take your medicine more often than directed. It is important that you put your used needles and syringes in a special sharps container. Do not put them in a trash can. If you do not have a sharps container, call your pharmacist or healthcare provider to get one. Talk to your pediatrician regarding the use of this medicine in children. While this drug may be prescribed for children as young as 7 months for selected conditions, precautions do  apply. Overdosage: If you think you have taken too much of this medicine contact a poison control center or emergency room at once. NOTE: This medicine is only for you. Do not share this medicine with others. What if I miss a dose? It is important not to miss your dose. Call your doctor or health care professional if you miss a dose. What may interact with this medicine? This medicine may interact with the following medications: -medicines that may cause a release of neutrophils, such as lithium This list may not describe all possible interactions. Give your health care provider a list of all the medicines, herbs, non-prescription drugs, or dietary supplements you use. Also tell them if you smoke, drink alcohol, or use illegal drugs. Some items may interact with your medicine. What should I watch for while using this medicine? You may need blood work done while you are taking this medicine. What side effects may I  notice from receiving this medicine? Side effects that you should report to your doctor or health care professional as soon as possible: -allergic reactions like skin rash, itching or hives, swelling of the face, lips, or tongue -dizziness or feeling faint -fever -pain, redness, or irritation at site where injected -pinpoint red spots on the skin -shortness of breath or breathing problems -signs and symptoms of kidney injury like trouble passing urine, change in the amount of urine, or red or dark-brown urine -stomach or side pain, or pain at the shoulder -swelling -tiredness - unusual bleeding or bruising Side effects that usually do not require medical attention (report to your doctor or health care professional if they continue or are bothersome): -bone pain -headache -muscle pain This list may not describe all possible side effects. Call your doctor for medical advice about side effects. You may report side effects to FDA at 1-800-FDA-1088. Where should I keep my  medicine? Keep out of the reach of children. Store in a refrigerator between 2 and 8 degrees C (36 and 46 degrees F). Do not freeze. Keep in carton to protect from light. Throw away this medicine if vials or syringes are left out of the refrigerator for more than 24 hours. Throw away any unused medicine after the expiration date. NOTE: This sheet is a summary. It may not cover all possible information. If you have questions about this medicine, talk to your doctor, pharmacist, or health care provider.  2018 Elsevier/Gold Standard (2014-07-10 10:57:13)

## 2017-03-26 ENCOUNTER — Inpatient Hospital Stay: Payer: Medicare HMO

## 2017-03-26 VITALS — BP 115/74 | HR 88 | Temp 98.1°F | Resp 18

## 2017-03-26 DIAGNOSIS — D500 Iron deficiency anemia secondary to blood loss (chronic): Secondary | ICD-10-CM

## 2017-03-26 DIAGNOSIS — D702 Other drug-induced agranulocytosis: Secondary | ICD-10-CM

## 2017-03-26 DIAGNOSIS — Z5189 Encounter for other specified aftercare: Secondary | ICD-10-CM | POA: Diagnosis not present

## 2017-03-26 DIAGNOSIS — C210 Malignant neoplasm of anus, unspecified: Secondary | ICD-10-CM | POA: Diagnosis not present

## 2017-03-26 MED ORDER — TBO-FILGRASTIM 480 MCG/0.8ML ~~LOC~~ SOSY
PREFILLED_SYRINGE | SUBCUTANEOUS | Status: AC
  Filled 2017-03-26: qty 0.8

## 2017-03-26 MED ORDER — FILGRASTIM 480 MCG/0.8ML IJ SOSY
480.0000 ug | PREFILLED_SYRINGE | Freq: Once | INTRAMUSCULAR | Status: DC
  Administered 2017-03-26: 480 ug via SUBCUTANEOUS

## 2017-03-26 NOTE — Patient Instructions (Signed)
Tbo-Filgrastim injection What is this medicine? TBO-FILGRASTIM (T B O fil GRA stim) is a granulocyte colony-stimulating factor that stimulates the growth of neutrophils, a type of white blood cell important in the body's fight against infection. It is used to reduce the incidence of fever and infection in patients with certain types of cancer who are receiving chemotherapy that affects the bone marrow. This medicine may be used for other purposes; ask your health care provider or pharmacist if you have questions. COMMON BRAND NAME(S): Granix What should I tell my health care provider before I take this medicine? They need to know if you have any of these conditions: -bone scan or tests planned -kidney disease -sickle cell anemia -an unusual or allergic reaction to tbo-filgrastim, filgrastim, pegfilgrastim, other medicines, foods, dyes, or preservatives -pregnant or trying to get pregnant -breast-feeding How should I use this medicine? This medicine is for injection under the skin. If you get this medicine at home, you will be taught how to prepare and give this medicine. Refer to the Instructions for Use that come with your medication packaging. Use exactly as directed. Take your medicine at regular intervals. Do not take your medicine more often than directed. It is important that you put your used needles and syringes in a special sharps container. Do not put them in a trash can. If you do not have a sharps container, call your pharmacist or healthcare provider to get one. Talk to your pediatrician regarding the use of this medicine in children. Special care may be needed. Overdosage: If you think you have taken too much of this medicine contact a poison control center or emergency room at once. NOTE: This medicine is only for you. Do not share this medicine with others. What if I miss a dose? It is important not to miss your dose. Call your doctor or health care professional if you miss a  dose. What may interact with this medicine? This medicine may interact with the following medications: -medicines that may cause a release of neutrophils, such as lithium This list may not describe all possible interactions. Give your health care provider a list of all the medicines, herbs, non-prescription drugs, or dietary supplements you use. Also tell them if you smoke, drink alcohol, or use illegal drugs. Some items may interact with your medicine. What should I watch for while using this medicine? You may need blood work done while you are taking this medicine. What side effects may I notice from receiving this medicine? Side effects that you should report to your doctor or health care professional as soon as possible: -allergic reactions like skin rash, itching or hives, swelling of the face, lips, or tongue -blood in the urine -dark urine -dizziness -fast heartbeat -feeling faint -shortness of breath or breathing problems -signs and symptoms of infection like fever or chills; cough; or sore throat -signs and symptoms of kidney injury like trouble passing urine or change in the amount of urine -stomach or side pain, or pain at the shoulder -sweating -swelling of the legs, ankles, or abdomen -tiredness Side effects that usually do not require medical attention (report to your doctor or health care professional if they continue or are bothersome): -bone pain -headache -muscle pain -vomiting This list may not describe all possible side effects. Call your doctor for medical advice about side effects. You may report side effects to FDA at 1-800-FDA-1088. Where should I keep my medicine? Keep out of the reach of children. Store in a refrigerator between   2 and 8 degrees C (36 and 46 degrees F). Keep in carton to protect from light. Throw away this medicine if it is left out of the refrigerator for more than 5 consecutive days. Throw away any unused medicine after the expiration  date. NOTE: This sheet is a summary. It may not cover all possible information. If you have questions about this medicine, talk to your doctor, pharmacist, or health care provider.  2018 Elsevier/Gold Standard (2015-02-10 19:07:04)  

## 2017-03-28 MED ORDER — TBO-FILGRASTIM 480 MCG/0.8ML ~~LOC~~ SOSY: 480.0000 ug | PREFILLED_SYRINGE | Freq: Once | SUBCUTANEOUS | Status: DC

## 2017-03-28 NOTE — Addendum Note (Signed)
Addended by: Wynona Neat on: 03/28/2017 07:25 AM   Modules accepted: Orders

## 2017-03-29 DIAGNOSIS — S90212A Contusion of left great toe with damage to nail, initial encounter: Secondary | ICD-10-CM | POA: Diagnosis not present

## 2017-03-29 DIAGNOSIS — L03032 Cellulitis of left toe: Secondary | ICD-10-CM | POA: Diagnosis not present

## 2017-03-31 ENCOUNTER — Telehealth: Payer: Self-pay | Admitting: *Deleted

## 2017-03-31 NOTE — Telephone Encounter (Signed)
Pt called reporting that she has infected toe.  Went to see her provider and was given Cephalexin.  Pt has been taking antibiotic for past 2 days; however, pt did not think it was helping at all - looks worse per pt. Dr. Burr Medico notified.  Spoke with pt and instructed pt to call her provider back to report worsening of infected toe - pt might need different antibiotic.  Pt voiced understanding. Pt's   Phone     [phone].

## 2017-04-04 ENCOUNTER — Encounter (HOSPITAL_COMMUNITY): Payer: Self-pay

## 2017-04-04 ENCOUNTER — Ambulatory Visit (HOSPITAL_COMMUNITY)
Admission: RE | Admit: 2017-04-04 | Discharge: 2017-04-04 | Disposition: A | Discharge status: Home / Self Care | Payer: Medicare HMO | Source: Ambulatory Visit | Attending: Hematology | Admitting: Hematology

## 2017-04-04 DIAGNOSIS — J449 Chronic obstructive pulmonary disease, unspecified: Secondary | ICD-10-CM | POA: Diagnosis not present

## 2017-04-04 DIAGNOSIS — C20 Malignant neoplasm of rectum: Secondary | ICD-10-CM | POA: Insufficient documentation

## 2017-04-04 DIAGNOSIS — E119 Type 2 diabetes mellitus without complications: Secondary | ICD-10-CM | POA: Diagnosis not present

## 2017-04-04 DIAGNOSIS — C218 Malignant neoplasm of overlapping sites of rectum, anus and anal canal: Secondary | ICD-10-CM | POA: Diagnosis not present

## 2017-04-04 MED ORDER — IOPAMIDOL (ISOVUE-300) INJECTION 61%
100.0000 mL | Freq: Once | INTRAVENOUS | Status: AC | PRN
  Administered 2017-04-04: 100 mL via INTRAVENOUS

## 2017-04-04 MED ORDER — IOPAMIDOL (ISOVUE-300) INJECTION 61%
INTRAVENOUS | Status: AC
  Filled 2017-04-04: qty 100

## 2017-04-05 NOTE — Progress Notes (Signed)
[place]  [phone] [phone]  Clinic Follow up Note   Patient Care Team: Tonya Passy, MD as PCP - General (Family Medicine) Tonya Shipper, MD as Consulting Physician (Gastroenterology) Tonya Ruff, MD as Consulting Physician (General Surgery) Tonya Merle, MD as Consulting Physician (Hematology) Tonya Rudd, MD as Consulting Physician (Radiation Oncology) 04/06/2017  SUMMARY OF ONCOLOGIC HISTORY: Oncology History   Cancer Staging Rectal adenocarcinoma [facility]) Staging form: Colon and Rectum, AJCC 8th Edition - Clinical stage from 07/09/2016: Stage IIA (cT3, cN0, cM0) - Signed by Tonya Merle, MD on 08/16/2016       Rectal adenocarcinoma ([place])   07/09/2016 Pathology Results    Diagnosis Surgical [P], rectum mass - INVASIVE ADENOCARCINOMA. - SEE COMMENT.      07/09/2016 Procedure    Colonoscopy A non-obstructing large friable mass was found in the distal rectum. This began at approximately 1 cm above the anal verge and extended proximal for a distance of 8-10 cm. The lesion was bulky and involved approximately 75% of the luminal circumference. Multiple biopsies were taken. A few small-mouthed diverticula were found in the left colon. The exam was otherwise without abnormality on direct views. Retroflexion not performed purposely.      07/12/2016 Imaging    CT C/A/P with contrast IMPRESSION: Rectal wall thickening/mass, compatible with known rectal adenocarcinoma. No findings specific for metastatic disease in the chest, abdomen, or pelvis. 2.2 cm probable hemangioma in segment 4B, although poorly evaluated. Consider MRI abdomen with/ without multihance contrast for definitive characterization, as clinically warranted. Bilateral adrenal nodules measuring up to 1.6 cm on the right, likely reflecting benign adrenal adenomas, although technically indeterminate. If MRI abdomen is performed, these can be definitively characterized at that time.      07/20/2016 Initial Diagnosis    Rectal adenocarcinoma ([place])      08/03/2016 Imaging    MRI AP W WO Contrast 08/03/16 IMPRESSION: 1. Large rectal mass measures 6.1 cm in length. No obstruction identified. This is compatible with at least a T3bN0M0 lesion. 2. No specific features highly suspicious for nodal metastasis or metastatic disease to the upper abdomen. 3. Indeterminate arterial phase enhancing lesion within the anterior dome of liver measures less than 1 cm. This may represent a benign liver lesions such as FNH or adenoma. Less favored with the hypervascular liver metastasis. Followup imaging at 6 months with repeat MRI of the liver is advise. 4. Bilateral adrenal adenomas      08/04/2016 - 09/16/2016 Radiation Therapy    Concurrent chemo radiation with Dr. Lisbeth [name]      08/04/2016 - 09/16/2016 Chemotherapy    Concurrent chemo radiation with Xeloda, 1500mg  twice daily      12/06/2016 Surgery    ULTRA LOW ANTERIOR RESECTION OF SIGMOID COLON AND RECTUM WITH COLOANAL ANASTAMOSIS AND LOOP ILEOSTOMY  by Dr. Drue [name] at [place]  12/06/16      12/06/2016 Pathology Results    FINAL PATHOLOGIC DIAGNOSIS 12/06/16 MICROSCOPIC EXAMINATION AND DIAGNOSIS  A.ANAL CANAL, MUCOSECTOMY: Benign rectal and fibroadipose tissue. Negative for malignancy.  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 Mucinous tumor focally invades through the muscularis propria into pericolorectal tissue. Radial margin interpreted as involved by invasive carcinoma (invasive acellular mucin withrare viable tumor cells focally comes to within 0.2 mm [0.02 cm] of the inked radial soft tissue margin). Treatment effect present, predominantly invasive acellular mucin associated with only very  rare small groups of  viable tumor cells (near complete response). 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 AJCC Pathologic Stage: ypT3 pN1a. See Cancer Case Summary.  C.POSTERIOR ANAL CANAL, EXCISION: Benign rectal tissue. Negative for malignancy.      01/26/2017 -  Chemotherapy    Adjuvant FOLFOX q2 weeks, plan for 6-8 cycles            04/04/2017 Imaging    CT CAP IMPRESSION: New complete interval resolution of bulky rectal soft tissue mass since prior exam.  No evidence of local or distant metastatic disease.     CURRENT THERAPY: AdjuvantFOLFOX q2 weeks began 01/26/17, plan for 6-8 cycles  INTERVAL HISTORY: [name] returns for follow-up as scheduled prior to cycle 6 FOLFOX. Completed cycle 5 on 03/23/17, granix on days 3 and 4, and had restaging CT. Had very mild bone pain with granix not requiring medication. Has good appetite, eating and drinking well.  Mild but manageable fatigue.  Her fingertips are sore, hands are very dry and sensitive.  She uses urea cream 2 times daily without much improvement.  There is a crack on her right thumb.  Nails are brittle.  No numbness or tingling.  She had difficulty opening the cap on orange juice lately but otherwise able to complete all tasks.  Cold sensitivity lasts 10-12 days.  No mucositis.  Mild intermittent nausea lasting 5 days after chemo, Zofran and Compazine help; still able to eat and drink.  Emesis x1 with over-eating [place] food.  Stool output consistency changes, occasionally thick but other times watery. She empties colostomy constantly during day and night. Takes Imodium and Lomotil around the clock without much improvement.  This is stable overall while on chemo.  No blood in stool. No abdominal or rectal pain. Occasional transient nosebleeds  with blowing nose, no profuse bleeding. Reports blurriness of vision, worse at night, since starting chemo. Has some difficulty reading and causes significant eye strain while reading. Denies vision loss, no redness or eye pain.    REVIEW OF SYSTEMS:   Constitutional: Denies fevers, chills or abnormal weight loss (+) good appetite (+) mild fatigue  Eyes: Denies double vision, eye pain, or redness (+) blurriness of vision Ears, nose, mouth, throat, and face: Denies mucositis or sore throat (+) occasional transient nosebleeds with blowing nose  Respiratory: Denies cough, dyspnea or wheezes Cardiovascular: Denies palpitation, chest discomfort or lower extremity swelling Gastrointestinal:  Denies constipation, heartburn, hematochezia, abdominal or rectal pain, or change in bowel habits (+) frequent watery to thick stool, empties ileostomy constantly, takes anti-diarrheal around the clock (+) intermittent mild nausea x5 days after chemo, improved with anti-emetics  Skin: Denies abnormal skin rashes (+) hand dryness, soreness (+) mild redness to fingertips (+) crack to right thumb  Lymphatics: Denies new lymphadenopathy or easy bruising Neurological:Denies numbness, tingling or new weaknesses Behavioral/Psych: Mood is stable, no new changes (+) looking forward to completing chemotherapy  All other systems were reviewed with the patient and are negative.  MEDICAL HISTORY:  Past Medical History:  Diagnosis Date  . Coronary artery disease 07/2003  . Myocardial infarction ([place])   . Obesity 2003   s/p gastric bypass     SURGICAL HISTORY: Past Surgical History:  Procedure Laterality Date  . CHOLECYSTECTOMY  1999  . GASTRIC BYPASS  2003  . IR FLUORO GUIDE PORT INSERTION RIGHT  01/25/2017  .  IR US GUIDE VASC ACCESS RIGHT  01/25/2017    I have reviewed the social history and family history with the patient and they are unchanged from previous note.  ALLERGIES:  is allergic to bactrim  [sulfamethoxazole-trimethoprim]; phenergan [promethazine hcl]; sulfa antibiotics; and penicillins.  MEDICATIONS:  Current Outpatient Medications  Medication Sig Dispense Refill  . aspirin 81 MG chewable tablet Chew 81 mg by mouth daily.    . cholecalciferol (VITAMIN D) 1000 UNITS tablet Take 1,000 Units by mouth daily.      . diphenoxylate-atropine (LOMOTIL) 2.5-0.025 MG tablet TAKE 1 TO 2 TABLETS BY MOUTH FOUR TIMES DAILY AS NEEDED 120 tablet 1  . ferrous sulfate (FERROUSUL) 325 (65 FE) MG tablet Take 1 tablet (325 mg total) by mouth daily with breakfast. (Patient taking differently: Take 325 mg by mouth. Take two by mouth daily) 30 tablet 6  . ibuprofen (ADVIL,MOTRIN) 200 MG tablet Take 200 mg by mouth every 6 (six) hours as needed.    . mirtazapine (REMERON) 15 MG tablet TAKE 1 TABLET(15 MG) BY MOUTH AT BEDTIME 30 tablet 0  . Multiple Vitamin (MULTIVITAMIN) tablet Take 1 tablet by mouth daily.    . ondansetron (ZOFRAN) 8 MG tablet Take 1 tablet (8 mg total) by mouth every 8 (eight) hours as needed for nausea or vomiting. 20 tablet 1  . prochlorperazine (COMPAZINE) 10 MG tablet Take 1 tablet (10 mg total) by mouth every 6 (six) hours as needed for nausea or vomiting. 30 tablet 1  . traMADol (ULTRAM) 50 MG tablet Take 1 tablet (50 mg total) by mouth every 6 (six) hours as needed. 90 tablet 0  . urea (CARMOL) 10 % cream Apply topically as needed. 71 g 1  . cephALEXin (KEFLEX) 500 MG capsule TK 1 C PO TID FOR 10 DAYS  0   Current Facility-Administered Medications  Medication Dose Route Frequency Provider Last Rate Last Dose  . 0.9 %  sodium chloride infusion  500 mL Intravenous Continuous Tonya Shipper, MD       Facility-Administered Medications Ordered in Other Visits  Medication Dose Route Frequency Provider Last Rate Last Dose  . fluorouracil (ADRUCIL) 3,450 mg in sodium chloride 0.9 % 81 mL chemo infusion  2,200 mg/m2 (Treatment Plan Recorded) Intravenous 1 day or 1 dose Tonya Merle, MD    3,450 mg at 04/06/17 1408  . heparin lock flush 100 unit/mL  500 Units Intracatheter Once PRN Tonya Merle, MD      . sodium chloride flush (NS) 0.9 % injection 10 mL  10 mL Intracatheter PRN Tonya Merle, MD      . Tbo-Filgrastim [facility]) injection 480 mcg  480 mcg Subcutaneous Once Tonya Merle, MD        PHYSICAL EXAMINATION: ECOG PERFORMANCE STATUS: 1 - Symptomatic but completely ambulatory  Vitals:   04/06/17 0902  BP: 129/78  Pulse: 66  Resp: 18  Temp: 98 F (36.7 C)  SpO2: 100%   Filed Weights   04/06/17 0902  Weight: 129 lb (58.5 kg)    GENERAL:alert, no distress and comfortable SKIN: skin color, texture, turgor are normal, no rashes or significant lesions (+) mild palmar erythema with dryness and right thumb crack EYES: normal, Conjunctiva are pink and non-injected, sclera clear OROPHARYNX:no exudate, no erythema and lips, buccal mucosa, and tongue normal  LYMPH:  no palpable cervical, supraclavicular, and axillary lymphadenopathy LUNGS: clear to auscultation with normal breathing effort HEART: regular rate & rhythm and no murmurs and no lower extremity edema ABDOMEN:abdomen  soft, non-tender and normal bowel sounds. (+) ileostomy Musculoskeletal:no cyanosis of digits and no clubbing  NEURO: alert & oriented x 3 with fluent speech, no focal motor/sensory deficits PAC without erythema   LABORATORY DATA:  I have reviewed the data as listed CBC Latest Ref Rng & Units 04/06/2017 03/23/2017 03/09/2017  WBC 3.9 - 10.3 K/uL 3.1(L) 2.0(L) 3.4(L)  Hemoglobin 11.6 - 15.9 g/dL - - -  Hematocrit 34.8 - 46.6 % 30.5(L) 32.2(L) 31.7(L)  Platelets 145 - 400 K/uL 104(L) 119(L) 137(L)     CMP Latest Ref Rng & Units 04/06/2017 03/23/2017 03/09/2017  Glucose 70 - 140 mg/dL 77 76 94  BUN 7 - 26 mg/dL 16 15 11   Creatinine 0.60 - 1.10 mg/dL 0.68 0.67 0.69  Sodium 136 - 145 mmol/L 139 139 140  Potassium 3.5 - 5.1 mmol/L 4.6 5.1 4.3  Chloride 98 - 109 mmol/L 109 107 111(H)  CO2 22 - 29 mmol/L 23 24  24   Calcium 8.4 - 10.4 mg/dL 8.9 9.3 8.9  Total Protein 6.4 - 8.3 g/dL 5.7(L) 5.9(L) 5.6(L)  Total Bilirubin 0.2 - 1.2 mg/dL 0.3 0.3 <0.2(L)  Alkaline Phos 40 - 150 U/L 84 94 84  AST 5 - 34 U/L 35(H) 56(H) 36(H)  ALT 0 - 55 U/L 36 50 42   PATHOLOGY:  FINAL PATHOLOGIC DIAGNOSIS 12/06/16 MICROSCOPIC EXAMINATION AND DIAGNOSIS  A.ANAL CANAL, MUCOSECTOMY: Benign rectal and fibroadipose tissue. Negative for malignancy.  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 Mucinous tumor focally invades through the muscularis propria into pericolorectal tissue. Radial margin interpreted as involved by invasive carcinoma (invasive acellular mucin with rare viable tumor cells focally comes to within 0.2 mm [0.02 cm] of the inked radial soft tissue margin). Treatment effect present, predominantly invasive acellular mucin associated with only very rare small groups of viable tumor cells (near complete response). 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 AJCC Pathologic Stage: ypT3 pN1a. See Cancer Case Summary.  C.POSTERIOR ANAL CANAL, EXCISION: Benign rectal tissue. Negative for malignancy.    07/09/2016: Diagnosis Surgical [P], rectum mass - INVASIVE ADENOCARCINOMA.      RADIOGRAPHIC STUDIES: I have personally reviewed the radiological images as listed and agreed with the findings in the report. No results found.   ASSESSMENT & PLAN: Tonya [name] is a 67 y.o. woman with past medical history of gastric bypass surgery, presented with rectal bleeding, loose bowel  movement and stool incontinence for one half years, was recently diagnosed invasive rectal adenocarcinoma.  1. Invasive low rectal adenocarcinoma, cT3N0M0, ypT3N1a, near complete response, (+) margin  2. Iron deficient anemia 3. Genetics 4. H/o gastric bypass surgery 2003 5. H/o DM, HTN 6. Liver lesion 7. Anorexia, weight loss 8. Diarrhea, loose stool 9. Depression 10. Peripheral neuropathy, G1 11. Hand/foot syndrome, secondary to chemotherapy especially 5FU 12. Blurry vision, secondary to chemotherapy  Ms. Smyers appears stable today. She completed 5 cycles adjuvant FOLFOX chemotherapy. She developed hand/foot syndrome, blurry vision, and mild hematologic toxicities but is tolerating treatment well overall. Urea was not helpful, I recommend she begin hydrocortisone to hands 4x daily for hand/foot syndrome. 5FU was dose reduced with cycle 5, continue current dose. She has blurry vision worse at night, causing some difficulty reading and associated eye strain, secondary to chemotherapy. Will refer her to ophthalmology. Labs  reviewed; [place] recovered, 1.9 today; anemia is slightly worse, Hgb 9.9 but does not require blood transfusion; iron studies are adequate. PLT 104K with mild nosebleeding. Given her cytopenias, will reduce oxaliplatin today to 70 mg/m2.   I reviewed restaging CT with her, which shows complete interval resolution of bulky rectal soft tissue mass and no evidence of local or distant metastatic disease. Will continue adjuvant FOLFOX for total of 8 cycles, proceed with cycle 6 today. Return in 2 weeks for f/u and cycle 7. We adjusted her schedule today.  PLAN -Labs and imaging reviewed, proceed with cycle 6 FOLFOX 5FU 2200 mg/m2, oxaliplatin 70 mg/m2 -ophthalmology referral  -f/u in 2 weeks with cycle 7 -Hydrocortisone cream for hand/foot syndrome    Orders Placed This Encounter  Procedures  . Ambulatory referral to Ophthalmology    Referral Priority:   Routine     Referral Type:   Consultation    Referral Reason:   Specialty Services Required    Requested Specialty:   Ophthalmology    Number of Visits Requested:   1   All questions were answered. The patient knows to call the clinic with any problems, questions or concerns. No barriers to learning was detected. I spent 20 minutes counseling the patient face to face. The total time spent in the appointment was 25 minutes and more than 50% was on counseling and review of test results     Alla Feeling, NP 04/06/17

## 2017-04-06 ENCOUNTER — Inpatient Hospital Stay: Payer: Medicare HMO

## 2017-04-06 ENCOUNTER — Telehealth: Payer: Self-pay | Admitting: Nurse Practitioner

## 2017-04-06 ENCOUNTER — Encounter: Payer: Self-pay | Admitting: Nurse Practitioner

## 2017-04-06 ENCOUNTER — Inpatient Hospital Stay (HOSPITAL_BASED_OUTPATIENT_CLINIC_OR_DEPARTMENT_OTHER): Payer: Medicare HMO | Admitting: Nurse Practitioner

## 2017-04-06 ENCOUNTER — Inpatient Hospital Stay: Payer: Medicare HMO | Attending: Hematology

## 2017-04-06 VITALS — BP 129/78 | HR 66 | Temp 98.0°F | Resp 18 | Ht 60.0 in | Wt 129.0 lb

## 2017-04-06 DIAGNOSIS — Z923 Personal history of irradiation: Secondary | ICD-10-CM | POA: Diagnosis not present

## 2017-04-06 DIAGNOSIS — C20 Malignant neoplasm of rectum: Secondary | ICD-10-CM

## 2017-04-06 DIAGNOSIS — C210 Malignant neoplasm of anus, unspecified: Secondary | ICD-10-CM | POA: Diagnosis not present

## 2017-04-06 DIAGNOSIS — L271 Localized skin eruption due to drugs and medicaments taken internally: Secondary | ICD-10-CM | POA: Diagnosis not present

## 2017-04-06 DIAGNOSIS — R197 Diarrhea, unspecified: Secondary | ICD-10-CM | POA: Insufficient documentation

## 2017-04-06 DIAGNOSIS — G620 Drug-induced polyneuropathy: Secondary | ICD-10-CM

## 2017-04-06 DIAGNOSIS — F329 Major depressive disorder, single episode, unspecified: Secondary | ICD-10-CM | POA: Insufficient documentation

## 2017-04-06 DIAGNOSIS — Z452 Encounter for adjustment and management of vascular access device: Secondary | ICD-10-CM | POA: Insufficient documentation

## 2017-04-06 DIAGNOSIS — Z5111 Encounter for antineoplastic chemotherapy: Secondary | ICD-10-CM | POA: Insufficient documentation

## 2017-04-06 DIAGNOSIS — Z9884 Bariatric surgery status: Secondary | ICD-10-CM

## 2017-04-06 DIAGNOSIS — D500 Iron deficiency anemia secondary to blood loss (chronic): Secondary | ICD-10-CM

## 2017-04-06 DIAGNOSIS — Z9221 Personal history of antineoplastic chemotherapy: Secondary | ICD-10-CM | POA: Diagnosis not present

## 2017-04-06 DIAGNOSIS — R630 Anorexia: Secondary | ICD-10-CM | POA: Insufficient documentation

## 2017-04-06 DIAGNOSIS — R634 Abnormal weight loss: Secondary | ICD-10-CM

## 2017-04-06 DIAGNOSIS — Z8049 Family history of malignant neoplasm of other genital organs: Secondary | ICD-10-CM | POA: Insufficient documentation

## 2017-04-06 DIAGNOSIS — H538 Other visual disturbances: Secondary | ICD-10-CM | POA: Diagnosis not present

## 2017-04-06 DIAGNOSIS — R69 Illness, unspecified: Secondary | ICD-10-CM | POA: Diagnosis not present

## 2017-04-06 DIAGNOSIS — K769 Liver disease, unspecified: Secondary | ICD-10-CM | POA: Diagnosis not present

## 2017-04-06 DIAGNOSIS — Z933 Colostomy status: Secondary | ICD-10-CM | POA: Insufficient documentation

## 2017-04-06 DIAGNOSIS — Z5189 Encounter for other specified aftercare: Secondary | ICD-10-CM | POA: Diagnosis present

## 2017-04-06 LAB — IRON AND TIBC
Iron: 80 ug/dL (ref 41–142)
SATURATION RATIOS: 27 % (ref 21–57)
TIBC: 301 ug/dL (ref 236–444)
UIBC: 221 ug/dL

## 2017-04-06 LAB — COMPREHENSIVE METABOLIC PANEL
ALK PHOS: 84 U/L (ref 40–150)
ALT: 36 U/L (ref 0–55)
ANION GAP: 7 (ref 3–11)
AST: 35 U/L — ABNORMAL HIGH (ref 5–34)
Albumin: 2.7 g/dL — ABNORMAL LOW (ref 3.5–5.0)
BUN: 16 mg/dL (ref 7–26)
CALCIUM: 8.9 mg/dL (ref 8.4–10.4)
CO2: 23 mmol/L (ref 22–29)
Chloride: 109 mmol/L (ref 98–109)
Creatinine, Ser: 0.68 mg/dL (ref 0.60–1.10)
GFR calc non Af Amer: >60 mL/min (ref >60)
Glucose, Bld: 77 mg/dL (ref 70–140)
POTASSIUM: 4.6 mmol/L (ref 3.5–5.1)
SODIUM: 139 mmol/L (ref 136–145)
Total Bilirubin: 0.3 mg/dL (ref 0.2–1.2)
Total Protein: 5.7 g/dL — ABNORMAL LOW (ref 6.4–8.3)

## 2017-04-06 LAB — FERRITIN: Ferritin: 508 ng/mL — ABNORMAL HIGH (ref 9–269)

## 2017-04-06 LAB — CBC WITH DIFFERENTIAL (CANCER CENTER ONLY)
Basophils Absolute: 0 10*3/uL (ref 0.0–0.1)
Basophils Relative: 0 %
EOS ABS: 0 10*3/uL (ref 0.0–0.5)
EOS PCT: 1 %
HCT: 30.5 % — ABNORMAL LOW (ref 34.8–46.6)
Hemoglobin: 9.9 g/dL — ABNORMAL LOW (ref 11.6–15.9)
LYMPHS ABS: 0.4 10*3/uL — AB (ref 0.9–3.3)
Lymphocytes Relative: 12 %
MCH: 29.3 pg (ref 25.1–34.0)
MCHC: 32.5 g/dL (ref 31.5–36.0)
MCV: 90.2 fL (ref 79.5–101.0)
MONOS PCT: 24 %
Monocytes Absolute: 0.7 10*3/uL (ref 0.1–0.9)
Neutro Abs: 1.9 10*3/uL (ref 1.5–6.5)
Neutrophils Relative %: 63 %
PLATELETS: 104 10*3/uL — AB (ref 145–400)
RBC: 3.38 MIL/uL — AB (ref 3.70–5.45)
RDW: 20.6 % — ABNORMAL HIGH (ref 11.2–14.5)
WBC: 3.1 10*3/uL — AB (ref 3.9–10.3)

## 2017-04-06 LAB — RETICULOCYTES
RBC.: 3.38 MIL/uL — ABNORMAL LOW (ref 3.70–5.45)
RETIC CT PCT: 2.6 % — AB (ref 0.7–2.1)
Retic Count, Absolute: 87.9 10*3/uL (ref 33.7–90.7)

## 2017-04-06 MED ORDER — SODIUM CHLORIDE 0.9 % IV SOLN
2200.0000 mg/m2 | INTRAVENOUS | Status: DC
  Administered 2017-04-06: 3450 mg via INTRAVENOUS
  Filled 2017-04-06: qty 69

## 2017-04-06 MED ORDER — DEXTROSE 5 % IV SOLN
Freq: Once | INTRAVENOUS | Status: AC
  Administered 2017-04-06: 10:00:00 via INTRAVENOUS

## 2017-04-06 MED ORDER — DEXAMETHASONE SODIUM PHOSPHATE 10 MG/ML IJ SOLN
INTRAMUSCULAR | Status: AC
  Filled 2017-04-06: qty 1

## 2017-04-06 MED ORDER — SODIUM CHLORIDE 0.9% FLUSH
3.0000 mL | Freq: Once | INTRAVENOUS | Status: AC | PRN
  Administered 2017-04-06: 10 mL via INTRAVENOUS
  Filled 2017-04-06: qty 10

## 2017-04-06 MED ORDER — DEXAMETHASONE SODIUM PHOSPHATE 10 MG/ML IJ SOLN
10.0000 mg | Freq: Once | INTRAMUSCULAR | Status: AC
  Administered 2017-04-06: 10 mg via INTRAVENOUS

## 2017-04-06 MED ORDER — HEPARIN SOD (PORK) LOCK FLUSH 100 UNIT/ML IV SOLN
500.0000 [IU] | Freq: Once | INTRAVENOUS | Status: DC | PRN
  Filled 2017-04-06: qty 5

## 2017-04-06 MED ORDER — DEXTROSE 5 % IV SOLN
70.0000 mg/m2 | Freq: Once | INTRAVENOUS | Status: AC
  Administered 2017-04-06: 110 mg via INTRAVENOUS
  Filled 2017-04-06: qty 20

## 2017-04-06 MED ORDER — SODIUM CHLORIDE 0.9% FLUSH
10.0000 mL | INTRAVENOUS | Status: DC | PRN
  Filled 2017-04-06: qty 10

## 2017-04-06 MED ORDER — PALONOSETRON HCL INJECTION 0.25 MG/5ML
0.2500 mg | Freq: Once | INTRAVENOUS | Status: AC
  Administered 2017-04-06: 0.25 mg via INTRAVENOUS

## 2017-04-06 MED ORDER — LEUCOVORIN CALCIUM INJECTION 350 MG
400.0000 mg/m2 | Freq: Once | INTRAMUSCULAR | Status: AC
  Administered 2017-04-06: 624 mg via INTRAVENOUS
  Filled 2017-04-06: qty 31.2

## 2017-04-06 MED ORDER — PALONOSETRON HCL INJECTION 0.25 MG/5ML
INTRAVENOUS | Status: AC
  Filled 2017-04-06: qty 5

## 2017-04-06 NOTE — Telephone Encounter (Signed)
Scheduled appt per 4/3 los - Gave patient calender.

## 2017-04-06 NOTE — Patient Instructions (Signed)
[facility] Discharge Instructions for Patients Receiving Chemotherapy  Today you received the following chemotherapy agents: oxaliplatin (Eloxatin), Leucovorin, and fluorouracil (Adrucil/5FU).   To help prevent nausea and vomiting after your treatment, we encourage you to take your nausea medication as directed.  If you develop nausea and vomiting that is not controlled by your nausea medication, call the clinic.   BELOW ARE SYMPTOMS THAT SHOULD BE REPORTED IMMEDIATELY:  *FEVER GREATER THAN 100.5 F  *CHILLS WITH OR WITHOUT FEVER  NAUSEA AND VOMITING THAT IS NOT CONTROLLED WITH YOUR NAUSEA MEDICATION  *UNUSUAL SHORTNESS OF BREATH  *UNUSUAL BRUISING OR BLEEDING  TENDERNESS IN MOUTH AND THROAT WITH OR WITHOUT PRESENCE OF ULCERS  *URINARY PROBLEMS  *BOWEL PROBLEMS  UNUSUAL RASH Items with * indicate a potential emergency and should be followed up as soon as possible.  Feel free to call the clinic should you have any questions or concerns. The clinic phone number is [phone].  Please show the CHEMO ALERT CARD at check-in to the Emergency Department and triage nurse.   

## 2017-04-07 DIAGNOSIS — L03032 Cellulitis of left toe: Secondary | ICD-10-CM | POA: Diagnosis not present

## 2017-04-08 ENCOUNTER — Inpatient Hospital Stay: Payer: Medicare HMO

## 2017-04-08 VITALS — BP 128/72 | HR 66 | Temp 98.0°F | Resp 18

## 2017-04-08 DIAGNOSIS — R630 Anorexia: Secondary | ICD-10-CM | POA: Diagnosis not present

## 2017-04-08 DIAGNOSIS — R69 Illness, unspecified: Secondary | ICD-10-CM | POA: Diagnosis not present

## 2017-04-08 DIAGNOSIS — H538 Other visual disturbances: Secondary | ICD-10-CM | POA: Diagnosis not present

## 2017-04-08 DIAGNOSIS — R634 Abnormal weight loss: Secondary | ICD-10-CM | POA: Diagnosis not present

## 2017-04-08 DIAGNOSIS — Z5111 Encounter for antineoplastic chemotherapy: Secondary | ICD-10-CM | POA: Diagnosis not present

## 2017-04-08 DIAGNOSIS — C20 Malignant neoplasm of rectum: Secondary | ICD-10-CM

## 2017-04-08 DIAGNOSIS — R197 Diarrhea, unspecified: Secondary | ICD-10-CM | POA: Diagnosis not present

## 2017-04-08 DIAGNOSIS — L271 Localized skin eruption due to drugs and medicaments taken internally: Secondary | ICD-10-CM | POA: Diagnosis not present

## 2017-04-08 DIAGNOSIS — D500 Iron deficiency anemia secondary to blood loss (chronic): Secondary | ICD-10-CM | POA: Diagnosis not present

## 2017-04-08 DIAGNOSIS — K769 Liver disease, unspecified: Secondary | ICD-10-CM | POA: Diagnosis not present

## 2017-04-08 MED ORDER — SODIUM CHLORIDE 0.9% FLUSH
10.0000 mL | INTRAVENOUS | Status: DC | PRN
  Administered 2017-04-08: 10 mL
  Filled 2017-04-08: qty 10

## 2017-04-08 MED ORDER — HEPARIN SOD (PORK) LOCK FLUSH 100 UNIT/ML IV SOLN
500.0000 [IU] | Freq: Once | INTRAVENOUS | Status: AC | PRN
  Administered 2017-04-08: 500 [IU]
  Filled 2017-04-08: qty 5

## 2017-04-18 NOTE — Progress Notes (Signed)
Trainer  [phone] [phone]  Clinic Follow Up Note   Patient Care Team: Lucille Passy, MD as PCP - General (Family Medicine) Irene Shipper, MD as Consulting Physician (Gastroenterology) Leighton Ruff, MD as Consulting Physician (General Surgery) Truitt Merle, MD as Consulting Physician (Hematology) Kyung Rudd, MD as Consulting Physician (Radiation Oncology)   Date of Service:  04/20/2017  CHIEF COMPLAINTS:  Follow up rectal Adenocarcinoma  Oncology History   Cancer Staging Rectal adenocarcinoma [facility]) Staging form: Colon and Rectum, AJCC 8th Edition - Clinical stage from 07/09/2016: Stage IIA (cT3, cN0, cM0) - Signed by Truitt Merle, MD on 08/16/2016       Rectal adenocarcinoma ([place])   07/09/2016 Pathology Results    Diagnosis Surgical [P], rectum mass - INVASIVE ADENOCARCINOMA. - SEE COMMENT.      07/09/2016 Procedure    Colonoscopy A non-obstructing large friable mass was found in the distal rectum. This began at approximately 1 cm above the anal verge and extended proximal for a distance of 8-10 cm. The lesion was bulky and involved approximately 75% of the luminal circumference. Multiple biopsies were taken. A few small-mouthed diverticula were found in the left colon. The exam was otherwise without abnormality on direct views. Retroflexion not performed purposely.      07/12/2016 Imaging    CT C/A/P with contrast IMPRESSION: Rectal wall thickening/mass, compatible with known rectal adenocarcinoma. No findings specific for metastatic disease in the chest, abdomen, or pelvis. 2.2 cm probable hemangioma in segment 4B, although poorly evaluated. Consider MRI abdomen with/ without multihance contrast for definitive characterization, as clinically warranted. Bilateral adrenal nodules measuring up to 1.6 cm on the right, likely reflecting benign adrenal adenomas, although technically indeterminate. If MRI abdomen is performed, these can be  definitively characterized at that time.      07/20/2016 Initial Diagnosis    Rectal adenocarcinoma ([place])      08/03/2016 Imaging    MRI AP W WO Contrast 08/03/16 IMPRESSION: 1. Large rectal mass measures 6.1 cm in length. No obstruction identified. This is compatible with at least a T3bN0M0 lesion. 2. No specific features highly suspicious for nodal metastasis or metastatic disease to the upper abdomen. 3. Indeterminate arterial phase enhancing lesion within the anterior dome of liver measures less than 1 cm. This may represent a benign liver lesions such as FNH or adenoma. Less favored with the hypervascular liver metastasis. Followup imaging at 6 months with repeat MRI of the liver is advise. 4. Bilateral adrenal adenomas      08/04/2016 - 09/16/2016 Radiation Therapy    Concurrent chemo radiation with Dr. [name]      08/04/2016 - 09/16/2016 Chemotherapy    Concurrent chemo radiation with Xeloda, '1500mg'$  twice daily      12/06/2016 Surgery    ULTRA LOW ANTERIOR RESECTION OF SIGMOID COLON AND RECTUM WITH COLOANAL ANASTAMOSIS AND LOOP ILEOSTOMY  by Dr. Drue Flirt at [place]  12/06/16      12/06/2016 Pathology Results    FINAL PATHOLOGIC DIAGNOSIS 12/06/16 MICROSCOPIC EXAMINATION AND DIAGNOSIS  A.ANAL CANAL, MUCOSECTOMY: Benign rectal and fibroadipose tissue. Negative for malignancy.  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 Mucinous tumor focally invades through the muscularis propria into pericolorectal tissue. Radial margin interpreted as involved by invasive carcinoma (invasive acellular mucin withrare viable tumor cells focally comes to within 0.2 mm [0.02 cm] of the inked radial soft tissue margin).  Treatment effect present, predominantly invasive acellular mucin  associated with only very rare small groups of viable tumor cells (near complete response). 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 AJCC Pathologic Stage: ypT3 pN1a. See Cancer Case Summary.  C.POSTERIOR ANAL CANAL, EXCISION: Benign rectal tissue. Negative for malignancy.      01/26/2017 -  Chemotherapy    Adjuvant FOLFOX q2 weeks, plan for 6-8 cycles            04/04/2017 Imaging    CT CAP IMPRESSION: New complete interval resolution of bulky rectal soft tissue mass since prior exam.  No evidence of local or distant metastatic disease.       HISTORY OF PRESENTING ILLNESS: 07/20/16 Tonya [name] [facility] 67 y.o. female is here because of recently discovered invasive adenocarcinoma. She is accompanied by a friend today. The patient underwent colonoscopy on 07/09/16 with Dr. [name]. According to Dr. Blanch Media note from that encounter, the patient presented with complaints of rectal bleeding and iron deficiency anemia. She reports she was told she has hemorrhoids and initially was not concerned about the rectal bleeding. Her most recent prior colonoscopy was 2005. On exam, Dr. Henrene Pastor noted a non-obstructing large friable mass was found in the distal rectum. This began at approximately 1 cm above the anal verge and extended proximal for a distance of 8-10 cm. The lesion was bulky and involved approximately 75% of the luminal circumference. Biopsy of the rectal mass on 07/09/16 showed invasive adenocarcinoma.  CT C/A/P on 07/12/16 revealed rectal wall thickening/mass, compatible with known rectal adenocarcinoma.No findings specific for metastatic disease in the chest, abdomen, or pelvis. 2.2 cm probable hemangioma in segment 4B, although poorly evaluated. Consider MRI abdomen with/ without multihance  contrast for definitive characterization, as clinically warranted. Bilateral adrenal nodules measuring up to 1.6 cm on the right, likely reflecting benign adrenal adenomas, although technically indeterminate. If MRI abdomen is performed, these can be definitively characterized at that time.  On 07/19/16 the patient consulted with Dr. Leighton Ruff of [facility]. Per her note, she recommends complete metastatic workup with MRI to evaluate liver, adrenal gland, and rectum. The patient will follow up with Dr. Marcello Moores following neoadjuvant therapy to discuss surgical resection.  The patient reports she has lost 30 lbs since January. She reports ongoing fatigue, which her PCP related to iron deficiency and vitamin D deficiency. She reports occasional incontinence, and irregular bowel activity. She reports ongoing bright red rectal bleeding. She denies cramps or pain. She takes 2 iron pills daily.  CURRENT THERAPY: Adjuvant FOLFOX q2 weeks began 01/26/17, plan for 8 cycles     INTERVAL HISTORY:  Tonya [name] is here for a follow-up and Cycle 7 FOLFOX. She presents to the clinic today by herself. She reports she did okay with Cycle 6. She reports he hands are improved and her nose is not bleeding anymore since I reduced her dose last time. She states she saw Dr. Drue Flirt yesterday and she has to gain 20 lbs before she can get reversal surgery.    On review of systems, pt denies mouth sores, leg swelling or any other complaints at this time. Pertinent positives are listed and detailed within the above HPI.   MEDICAL HISTORY:  Past Medical History:  Diagnosis Date  . Coronary artery disease 07/2003  . Myocardial infarction ([place])   . Obesity 2003   s/p  gastric bypass     SURGICAL HISTORY: Past Surgical History:  Procedure Laterality Date  . CHOLECYSTECTOMY  1999  . GASTRIC BYPASS  2003  . IR FLUORO GUIDE PORT INSERTION RIGHT  01/25/2017  . IR US GUIDE VASC ACCESS RIGHT  01/25/2017      SOCIAL HISTORY: Social History   Socioeconomic History  . Marital status: Single    Spouse name: Not on file  . Number of children: 0  . Years of education: Not on file  . Highest education level: Not on file  Occupational History  . Occupation: retired  Scientific laboratory technician  . Financial resource strain: Not on file  . Food insecurity:    Worry: Not on file    Inability: Not on file  . Transportation needs:    Medical: Not on file    Non-medical: Not on file  Tobacco Use  . Smoking status: Never Smoker  . Smokeless tobacco: Never Used  Substance and Sexual Activity  . Alcohol use: No  . Drug use: No  . Sexual activity: Not on file  Lifestyle  . Physical activity:    Days per week: Not on file    Minutes per session: Not on file  . Stress: Not on file  Relationships  . Social connections:    Talks on phone: Not on file    Gets together: Not on file    Attends religious service: Not on file    Active member of club or organization: Not on file    Attends meetings of clubs or organizations: Not on file    Relationship status: Not on file  . Intimate partner violence:    Fear of current or ex partner: Not on file    Emotionally abused: Not on file    Physically abused: Not on file    Forced sexual activity: Not on file  Other Topics Concern  . Not on file  Social History Narrative  . Not on file    FAMILY HISTORY: Family History  Problem Relation Age of Onset  . Heart attack Mother        d.93  . Throat cancer Mother 1  . Clotting disorder Mother   . Liver disease Mother   . Kidney disease Mother   . Heart attack Father        d.92  . Prostate cancer Father 33  . Uterine cancer Sister 41       treated with total hysterectomy and radiation  . Thyroid cancer Sister 61       treated with thyroidectomy  . Cancer Maternal Uncle        d.80s unspecified type of cancer. History of smoking.  Marland Kitchen Uterine cancer Sister 6  . Colon cancer Cousin 35       paternal  first-cousin    ALLERGIES:  is allergic to bactrim [sulfamethoxazole-trimethoprim]; phenergan [promethazine hcl]; sulfa antibiotics; and penicillins.  MEDICATIONS:  Current Outpatient Medications  Medication Sig Dispense Refill  . aspirin 81 MG chewable tablet Chew 81 mg by mouth daily.    . cholecalciferol (VITAMIN D) 1000 UNITS tablet Take 1,000 Units by mouth daily.      . diphenoxylate-atropine (LOMOTIL) 2.5-0.025 MG tablet TAKE 1 TO 2 TABLETS BY MOUTH FOUR TIMES DAILY AS NEEDED 120 tablet 1  . ferrous sulfate (FERROUSUL) 325 (65 FE) MG tablet Take 1 tablet (325 mg total) by mouth daily with breakfast. (Patient taking differently: Take 325 mg by mouth. Take two by mouth daily) 30  tablet 6  . ibuprofen (ADVIL,MOTRIN) 200 MG tablet Take 200 mg by mouth every 6 (six) hours as needed.    . mirtazapine (REMERON) 15 MG tablet TAKE 1 TABLET(15 MG) BY MOUTH AT BEDTIME 30 tablet 0  . Multiple Vitamin (MULTIVITAMIN) tablet Take 1 tablet by mouth daily.    . ondansetron (ZOFRAN) 8 MG tablet Take 1 tablet (8 mg total) by mouth every 8 (eight) hours as needed for nausea or vomiting. 20 tablet 1  . prochlorperazine (COMPAZINE) 10 MG tablet Take 1 tablet (10 mg total) by mouth every 6 (six) hours as needed for nausea or vomiting. 30 tablet 1  . urea (CARMOL) 10 % cream Apply topically as needed. 71 g 1  . traMADol (ULTRAM) 50 MG tablet Take 1 tablet (50 mg total) by mouth every 6 (six) hours as needed. (Patient not taking: Reported on 04/20/2017) 90 tablet 0   Current Facility-Administered Medications  Medication Dose Route Frequency Provider Last Rate Last Dose  . 0.9 %  sodium chloride infusion  500 mL Intravenous Continuous Irene Shipper, MD       Facility-Administered Medications Ordered in Other Visits  Medication Dose Route Frequency Provider Last Rate Last Dose  . fluorouracil (ADRUCIL) 3,450 mg in sodium chloride 0.9 % 81 mL chemo infusion  2,200 mg/m2 (Treatment Plan Recorded) Intravenous 1  day or 1 dose Truitt Merle, MD   3,450 mg at 04/20/17 1345  . Tbo-Filgrastim (GRANIX) injection 480 mcg  480 mcg Subcutaneous Once Truitt Merle, MD        REVIEW OF SYSTEMS:  Constitutional: Denies fevers, chills or abnormal night sweats (+) good appetite, with change in diet (+) steady weight  (+) low energy Eyes: Denies blurriness of vision, double vision or watery eyes Ears, nose, mouth, throat, and face: Denies mucositis or sore throat Respiratory: Denies cough, dyspnea or wheezes Cardiovascular: Denies palpitation, chest discomfort or lower extremity swelling Gastrointestinal:  Denies nausea, heartburn (+) very loose stool output (+) ileostomy bag Skin: Denies abnormal skin rashes  Lymphatics: Denies new lymphadenopathy or easy bruising Neurological:Denies numbness, tingling or new weaknesses (+) cold sensitivity  Behavioral/Psych: no new changes (+) overall down mood, depression All other systems were reviewed with the patient and are negative.  PHYSICAL EXAMINATION:  ECOG PERFORMANCE STATUS: 1 - Symptomatic but completely ambulatory Vitals:   04/20/17 0929  BP: 119/73  Pulse: 67  Resp: 18  Temp: 98.2 F (36.8 C)  TempSrc: Oral  SpO2: 100%  Weight: 128 lb 4.8 oz (58.2 kg)  Height: 5' (1.524 m)   GENERAL:alert, no distress and comfortable SKIN: skin color, texture, turgor are normal, no rashes or significant lesions, Mild erythema parienal area, no skin breakdown or discharge  EYES: normal, conjunctiva are pink and non-injected, sclera clear OROPHARYNX:no exudate, no erythema and lips, buccal mucosa, and tongue normal NECK: supple, thyroid normal size, non-tender, without nodularity LYMPH:  no palpable lymphadenopathy in the cervical, axillary or inguinal LUNGS: clear to auscultation and percussion with normal breathing effort HEART: regular rate & rhythm and no murmurs and no lower extremity edema ABDOMEN:abdomen soft, non-tender, and normal bowel sounds. No organomegaly. Low  abdomen (+) surgical incision healing well (+) ileostomy bag with watery stool  Musculoskeletal:no cyanosis of digits and no clubbing PSYCH: alert & oriented x 3 with fluent speech NEURO: no focal motor/sensory deficits Rectal Exam: Deferred today   LABORATORY DATA:  I have reviewed the data as listed. CBC Latest Ref Rng & Units 04/20/2017 04/06/2017 03/23/2017  WBC 3.9 - 10.3 K/uL 3.5(L) 3.1(L) 2.0(L)  Hemoglobin 11.6 - 15.9 g/dL - - -  Hematocrit 34.8 - 46.6 % 30.6(L) 30.5(L) 32.2(L)  Platelets 145 - 400 K/uL 122(L) 104(L) 119(L)   CMP Latest Ref Rng & Units 04/20/2017 04/06/2017 03/23/2017  Glucose 70 - 140 mg/dL 79 77 76  BUN 7 - 26 mg/dL '11 16 15  '$ Creatinine 0.60 - 1.10 mg/dL 0.69 0.68 0.67  Sodium 136 - 145 mmol/L 138 139 139  Potassium 3.5 - 5.1 mmol/L 4.6 4.6 5.1  Chloride 98 - 109 mmol/L 109 109 107  CO2 22 - 29 mmol/L '25 23 24  '$ Calcium 8.4 - 10.4 mg/dL 9.2 8.9 9.3  Total Protein 6.4 - 8.3 g/dL 6.0(L) 5.7(L) 5.9(L)  Total Bilirubin 0.2 - 1.2 mg/dL <0.2(L) 0.3 0.3  Alkaline Phos 40 - 150 U/L 101 84 94  AST 5 - 34 U/L 44(H) 35(H) 56(H)  ALT 0 - 55 U/L 40 36 50    PATHOLOGY:  FINAL PATHOLOGIC DIAGNOSIS 12/06/16 MICROSCOPIC EXAMINATION AND DIAGNOSIS  A.ANAL CANAL, MUCOSECTOMY: Benign rectal and fibroadipose tissue. Negative for malignancy.  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 Mucinous tumor focally invades through the muscularis propria into pericolorectal tissue. Radial margin interpreted as involved by invasive carcinoma (invasive acellular mucin with rare viable tumor cells focally comes to within 0.2 mm [0.02 cm] of the inked radial soft tissue margin). Treatment effect present, predominantly invasive acellular mucin associated with only very  rare small groups of viable tumor cells (near complete response). 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 AJCC Pathologic Stage: ypT3 pN1a. See Cancer Case Summary.  C.POSTERIOR ANAL CANAL, EXCISION: Benign rectal tissue. Negative for malignancy.    07/09/2016: Diagnosis Surgical [P], rectum mass - INVASIVE ADENOCARCINOMA.  RADIOGRAPHIC STUDIES: I have personally reviewed the radiological images as listed and agreed with the findings in the report.  CT CAP W Contrast 04/04/17 IMPRESSION: New complete interval resolution of bulky rectal soft tissue mass since prior exam. No evidence of local or distant metastatic disease.  Ct Chest W Contrast  Result Date: 04/04/2017 CLINICAL DATA:  Rectal adenocarcinoma. Undergoing neoadjuvant chemotherapy. EXAM: CT CHEST, ABDOMEN, AND PELVIS WITH CONTRAST TECHNIQUE: Multidetector CT imaging of the chest, abdomen and pelvis was performed following the standard protocol during bolus administration of intravenous contrast. CONTRAST:  167m ISOVUE-300 IOPAMIDOL (ISOVUE-300) INJECTION 61% COMPARISON:  07/12/2016 FINDINGS: CT CHEST FINDINGS Cardiovascular: No acute findings. Aortic and coronary artery atherosclerosis. Mediastinum/Lymph Nodes: No masses or pathologically enlarged lymph nodes identified. Lungs/Pleura: No pulmonary infiltrate or pleural effusion. No suspicious pulmonary nodules or masses are identified. A tiny calcified granuloma is again seen in the right middle lobe. Musculoskeletal:  No suspicious bone lesions identified. CT ABDOMEN AND PELVIS FINDINGS Hepatobiliary: No masses identified. Previously seen subcapsular lesion in segment 4B of the left lobe is no longer visualized. Prior cholecystectomy. No evidence of biliary obstruction. Pancreas:  No  mass or inflammatory changes. Spleen:  Within normal limits in size and appearance. Adrenals/Urinary tract: Stable tiny bilateral adrenal nodules, consistent with benign adenomas as demonstrated on previous MRI. A few small nonobstructing right renal calculi are again seen. Congenital malrotation of right kidney again noted. No evidence of masses or hydronephrosis. Stomach/Bowel: Postop changes from previous gastric bypass surgery again seen. Diverting ileostomy now seen  in the right paraumbilical region. Previously seen bulky circumferential rectal soft tissue mass has nearly completely resolved. Perirectal soft tissue stranding is noted, however no perirectal soft tissue masses or enlarged lymph nodes seen. Vascular/Lymphatic: No pathologically enlarged lymph nodes identified. No abdominal aortic aneurysm. Reproductive:  No mass or other significant abnormality identified. Other:  None. Musculoskeletal:  No suspicious bone lesions identified. IMPRESSION: New complete interval resolution of bulky rectal soft tissue mass since prior exam. No evidence of local or distant metastatic disease. Electronically Signed   By: Earle Gell M.D.   On: 04/04/2017 09:07   Ct Abdomen Pelvis W Contrast  Result Date: 04/04/2017 CLINICAL DATA:  Rectal adenocarcinoma. Undergoing neoadjuvant chemotherapy. EXAM: CT CHEST, ABDOMEN, AND PELVIS WITH CONTRAST TECHNIQUE: Multidetector CT imaging of the chest, abdomen and pelvis was performed following the standard protocol during bolus administration of intravenous contrast. CONTRAST:  118m ISOVUE-300 IOPAMIDOL (ISOVUE-300) INJECTION 61% COMPARISON:  07/12/2016 FINDINGS: CT CHEST FINDINGS Cardiovascular: No acute findings. Aortic and coronary artery atherosclerosis. Mediastinum/Lymph Nodes: No masses or pathologically enlarged lymph nodes identified. Lungs/Pleura: No pulmonary infiltrate or pleural effusion. No suspicious pulmonary nodules or masses are identified. A tiny calcified  granuloma is again seen in the right middle lobe. Musculoskeletal:  No suspicious bone lesions identified. CT ABDOMEN AND PELVIS FINDINGS Hepatobiliary: No masses identified. Previously seen subcapsular lesion in segment 4B of the left lobe is no longer visualized. Prior cholecystectomy. No evidence of biliary obstruction. Pancreas:  No mass or inflammatory changes. Spleen:  Within normal limits in size and appearance. Adrenals/Urinary tract: Stable tiny bilateral adrenal nodules, consistent with benign adenomas as demonstrated on previous MRI. A few small nonobstructing right renal calculi are again seen. Congenital malrotation of right kidney again noted. No evidence of masses or hydronephrosis. Stomach/Bowel: Postop changes from previous gastric bypass surgery again seen. Diverting ileostomy now seen in the right paraumbilical region. Previously seen bulky circumferential rectal soft tissue mass has nearly completely resolved. Perirectal soft tissue stranding is noted, however no perirectal soft tissue masses or enlarged lymph nodes seen. Vascular/Lymphatic: No pathologically enlarged lymph nodes identified. No abdominal aortic aneurysm. Reproductive:  No mass or other significant abnormality identified. Other:  None. Musculoskeletal:  No suspicious bone lesions identified. IMPRESSION: New complete interval resolution of bulky rectal soft tissue mass since prior exam. No evidence of local or distant metastatic disease. Electronically Signed   By: JEarle GellM.D.   On: 04/04/2017 09:07    ASSESSMENT & PLAN:  CDiannah Rindfleischis a 67y.o. woman with past medical history of gastric bypass surgery, presented with rectal bleeding, loose bowel movement and stool incontinence for one half years, was recently diagnosed invasive rectal adenocarcinoma.  1. Invasive low rectal adenocarcinoma, cT3N0M0, ypT3N1a, near complete response, (+) margin, MMR normal  -I reviewed his CT scan findings, and surgical  pathology results in great details with patient and her friend.  -I discussed her abdominal and pelvic MRI scan findings, which showed a T3 N0 rectal tumor. The liver lesion is indeterminate, low possibility of metastasis. We'll continue observation, and repeat scan in 3-6 months. -Patient agrees to proceed with concurrent chemotherapy and radiation. She completed on 09/16/16. Patient has tolerated concurrent chemotherapy and radiation very well., other than rectal pain.  -She underwent surgery on 12/06/16 with Dr[facility] The surgical path showed near complete response to neoadjuvant chemoradiation, but it was still a T3 lesion, and unfortunately the radial margin was positive and one out of 12 nodes was positive.  She is stage III and moderate risk of distant recurrence and high risk of local recurrence   -I recommend adjuvant chemotherapy with CAPOX every 3 weeks or FOLFOX every 2 weeks for at least 2-3 months. I previously discussed the side effects in great detail. I gave reading material on these medications. Although I think she may tolerate FOLFOX better, she is very stressed about the pump, and see frequent visit for this treatment regimen.  After lengthy discussion, we decided to try Xeloda and oxaliplatin first.  I plan to reduce her oxaliplatin dose for the first cycle to see if she is able to tolerate. Side effects were discussed with patient in great detail. She agrees to proceed.  -Pt previously appeared to be very depressed and devastated by her slow recovery, colostomy bag etc, and has little interest in her life now. Her depression has improved since she started adjuvant chemo  -She has been tolerating chemotherapy overall well, main side effect of fatigue, hand cracking, cold sensitivity, no worsening of her diarrhea, no significant nausea or other issues. -restaging CT from 04/04/17 was reviewed with her, which shows complete interval resolution of bulky rectal soft tissue mass and  no evidence of local or distant metastatic disease. -Labs reviewed today, adequate to proceed with Cycle 7 FOLFOX, she will finish adjuvant chemo in 2 weeks  -f/u in in 2 weeks for last cycle chemo. She is scheduled for ileostomy reversal on 06/17/17.  -I discussed the risk of cancer recurrence in the future. I discussed the surveillance plan, which is a physical exam and lab test (including CBC, CMP and CEA) every 3-4 months for the first 2 years, then every 6-12 months, colonoscopy in one year, and surveilliance CT scan every 6-12 month for up to 5 year. After the last cycle chemo, she will return in 2-3 months for a surveillance scan and the rest of the surveillance plan. I also suggest she keep her port for a year to year and a half.  -Patient stated that she would not consider chemotherapy in the future even if she has recurrent disease. In that case, OK to remove her port. She will think about it.   2. Iron Deficient Anemia  - The patient takes 2 iron supplements daily. - Her iron has been somewhat low. - She will stop taking aspirin to reduce her bleeding risk. -Repeat iron studies showed iron deficiency, she received 1 dose IV iron Feraheme in the past -Her levels are improved now, Ferritin is 272 as of 08/16/2016  -Hgb is 10.6 after 4 cycles of chemo -Repeat iron studies normal previously  3. Genetics  -Patient has 2 sisters who had uterine cancer, this is suspicious for lynch syndrome. -I encouraged the patient to pursue genetic testing based on her family history. This will determine if the patient has inheritable genetic syndrome, such as Lynch syndrome -The patient was seen by genetic counselor on July 29, 2016, but declined genetic testing.   4. History of gastric bypass surgery in 2003 -for obesity   5. History of DM and HTN -Resolved after her gastric bypass surgery. -We'll monitor her blood pressure and glucose closely during her chemotherapy treatment. -Her glucose is 76   (03/23/17) and her BP is 127/79  6. Liver lesion -Indeterminate, no high suspicion for metastatic disease, repeated a CT scan on April 04, 2017 showed no liver lesions.  7. Anorexia, Weight loss -She has loss weight since surgery as she has not been eating much  -I  strongly encouraged her to take ensure boost or carnation breakfast  -I will set up a consultation with our dietitian  -Her weight loss has slowed down. I advised her to increase her ensure boost to 2 a day.  -appetite overall much improved overall, gained a few lbs   8. Diarrhea, loose stool  -She has imodium at home, I prescribed lomotil to slow down her BMs -I advised her to increase her lomotil up to 6-8 times a day as needed.  -I suggested her to use imodium in conjunction with lomotil to help with diarrhea. She can take OTC medication for gas -I refilled lomotil on (02/23/17)  9. Depression -I previously discussed as this is a hard time for her post surgery she can talk to someone at our clinic. I recommend her to see our Education officer, museum for counseling, she agreed.  -I prescribed Mirtazapine on (12/22/16) as this can also help her appetite and sleep, she can start at half dose to see if she can tolerate.  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 No functional or sensory deficit. Will monitor closely.  11. 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 Blurry vision, secondary to chemotherapy -She has blurry vision worse at night, causing some difficulty reading and associated eye strain, secondary to chemotherapy.  -referred her to ophthalmology    PLAN: -Labs reviewed, proceed with cycle 7 today -Lab and and f/u in 2 weeks for last  cycle chemo    No orders of the defined types were placed in this encounter.  All questions were answered. The patient knows to call the clinic with any problems, questions or concerns.  I spent 20 minutes counseling the patient face to face. The total time spent in the appointment was 25 minutes and more than 50% was on counseling.  This document serves as a record of services personally performed by Truitt Merle, MD. It was created on her behalf by Theresia Bough, a trained medical scribe. The creation of this record is based on the scribe's personal observations and the provider's statements to them.   I have reviewed the above documentation for accuracy and completeness, and I agree with the above.     Truitt Merle, MD 04/20/2017 2:11 PM

## 2017-04-19 DIAGNOSIS — C20 Malignant neoplasm of rectum: Secondary | ICD-10-CM | POA: Diagnosis not present

## 2017-04-19 DIAGNOSIS — Z9221 Personal history of antineoplastic chemotherapy: Secondary | ICD-10-CM | POA: Diagnosis not present

## 2017-04-19 DIAGNOSIS — Z9049 Acquired absence of other specified parts of digestive tract: Secondary | ICD-10-CM | POA: Diagnosis not present

## 2017-04-19 DIAGNOSIS — Z483 Aftercare following surgery for neoplasm: Secondary | ICD-10-CM | POA: Diagnosis not present

## 2017-04-20 ENCOUNTER — Telehealth: Payer: Self-pay | Admitting: Hematology

## 2017-04-20 ENCOUNTER — Inpatient Hospital Stay: Payer: Medicare HMO

## 2017-04-20 ENCOUNTER — Inpatient Hospital Stay (HOSPITAL_BASED_OUTPATIENT_CLINIC_OR_DEPARTMENT_OTHER): Payer: Medicare HMO | Admitting: Hematology

## 2017-04-20 ENCOUNTER — Encounter: Payer: Self-pay | Admitting: Hematology

## 2017-04-20 VITALS — BP 119/73 | HR 67 | Temp 98.2°F | Resp 18 | Ht 60.0 in | Wt 128.3 lb

## 2017-04-20 DIAGNOSIS — D500 Iron deficiency anemia secondary to blood loss (chronic): Secondary | ICD-10-CM

## 2017-04-20 DIAGNOSIS — C20 Malignant neoplasm of rectum: Secondary | ICD-10-CM | POA: Diagnosis not present

## 2017-04-20 DIAGNOSIS — R197 Diarrhea, unspecified: Secondary | ICD-10-CM

## 2017-04-20 DIAGNOSIS — L271 Localized skin eruption due to drugs and medicaments taken internally: Secondary | ICD-10-CM | POA: Diagnosis not present

## 2017-04-20 DIAGNOSIS — Z95828 Presence of other vascular implants and grafts: Secondary | ICD-10-CM | POA: Insufficient documentation

## 2017-04-20 DIAGNOSIS — G620 Drug-induced polyneuropathy: Secondary | ICD-10-CM

## 2017-04-20 DIAGNOSIS — K909 Intestinal malabsorption, unspecified: Secondary | ICD-10-CM

## 2017-04-20 DIAGNOSIS — Z5111 Encounter for antineoplastic chemotherapy: Secondary | ICD-10-CM | POA: Diagnosis not present

## 2017-04-20 DIAGNOSIS — R630 Anorexia: Secondary | ICD-10-CM | POA: Diagnosis not present

## 2017-04-20 DIAGNOSIS — R69 Illness, unspecified: Secondary | ICD-10-CM | POA: Diagnosis not present

## 2017-04-20 DIAGNOSIS — C210 Malignant neoplasm of anus, unspecified: Secondary | ICD-10-CM | POA: Diagnosis not present

## 2017-04-20 DIAGNOSIS — H538 Other visual disturbances: Secondary | ICD-10-CM | POA: Diagnosis not present

## 2017-04-20 DIAGNOSIS — K769 Liver disease, unspecified: Secondary | ICD-10-CM | POA: Diagnosis not present

## 2017-04-20 DIAGNOSIS — R634 Abnormal weight loss: Secondary | ICD-10-CM | POA: Diagnosis not present

## 2017-04-20 LAB — IRON AND TIBC
Iron: 102 ug/dL (ref 41–142)
SATURATION RATIOS: 34 % (ref 21–57)
TIBC: 296 ug/dL (ref 236–444)
UIBC: 195 ug/dL

## 2017-04-20 LAB — CBC WITH DIFFERENTIAL (CANCER CENTER ONLY)
BASOS PCT: 0 %
Basophils Absolute: 0 10*3/uL (ref 0.0–0.1)
EOS ABS: 0.1 10*3/uL (ref 0.0–0.5)
EOS PCT: 2 %
HCT: 30.6 % — ABNORMAL LOW (ref 34.8–46.6)
HEMOGLOBIN: 9.8 g/dL — AB (ref 11.6–15.9)
LYMPHS ABS: 0.6 10*3/uL — AB (ref 0.9–3.3)
Lymphocytes Relative: 18 %
MCH: 29.4 pg (ref 25.1–34.0)
MCHC: 32 g/dL (ref 31.5–36.0)
MCV: 91.9 fL (ref 79.5–101.0)
Monocytes Absolute: 0.4 10*3/uL (ref 0.1–0.9)
Monocytes Relative: 10 %
NEUTROS PCT: 70 %
Neutro Abs: 2.5 10*3/uL (ref 1.5–6.5)
PLATELETS: 122 10*3/uL — AB (ref 145–400)
RBC: 3.33 MIL/uL — AB (ref 3.70–5.45)
RDW: 20.3 % — ABNORMAL HIGH (ref 11.2–14.5)
WBC: 3.5 10*3/uL — AB (ref 3.9–10.3)

## 2017-04-20 LAB — FERRITIN: Ferritin: 349 ng/mL — ABNORMAL HIGH (ref 9–269)

## 2017-04-20 LAB — COMPREHENSIVE METABOLIC PANEL
ALK PHOS: 101 U/L (ref 40–150)
ALT: 40 U/L (ref 0–55)
ANION GAP: 4 (ref 3–11)
AST: 44 U/L — ABNORMAL HIGH (ref 5–34)
Albumin: 2.8 g/dL — ABNORMAL LOW (ref 3.5–5.0)
BUN: 11 mg/dL (ref 7–26)
CHLORIDE: 109 mmol/L (ref 98–109)
CO2: 25 mmol/L (ref 22–29)
Calcium: 9.2 mg/dL (ref 8.4–10.4)
Creatinine, Ser: 0.69 mg/dL (ref 0.60–1.10)
GFR calc non Af Amer: >60 mL/min (ref >60)
GLUCOSE: 79 mg/dL (ref 70–140)
POTASSIUM: 4.6 mmol/L (ref 3.5–5.1)
SODIUM: 138 mmol/L (ref 136–145)
Total Bilirubin: <0.2 mg/dL — ABNORMAL LOW (ref 0.2–1.2)
Total Protein: 6 g/dL — ABNORMAL LOW (ref 6.4–8.3)

## 2017-04-20 LAB — RETICULOCYTES
RBC.: 3.33 MIL/uL — AB (ref 3.70–5.45)
RETIC CT PCT: 2.2 % — AB (ref 0.7–2.1)
Retic Count, Absolute: 73.3 10*3/uL (ref 33.7–90.7)

## 2017-04-20 MED ORDER — DEXTROSE 5 % IV SOLN
70.0000 mg/m2 | Freq: Once | INTRAVENOUS | Status: AC
  Administered 2017-04-20: 110 mg via INTRAVENOUS
  Filled 2017-04-20: qty 22

## 2017-04-20 MED ORDER — SODIUM CHLORIDE 0.9 % IV SOLN
2200.0000 mg/m2 | INTRAVENOUS | Status: DC
  Administered 2017-04-20: 3450 mg via INTRAVENOUS
  Filled 2017-04-20: qty 69

## 2017-04-20 MED ORDER — PALONOSETRON HCL INJECTION 0.25 MG/5ML
0.2500 mg | Freq: Once | INTRAVENOUS | Status: AC
  Administered 2017-04-20: 0.25 mg via INTRAVENOUS

## 2017-04-20 MED ORDER — SODIUM CHLORIDE 0.9% FLUSH
10.0000 mL | Freq: Once | INTRAVENOUS | Status: AC
  Administered 2017-04-20: 10 mL
  Filled 2017-04-20: qty 10

## 2017-04-20 MED ORDER — LEUCOVORIN CALCIUM INJECTION 350 MG
400.0000 mg/m2 | Freq: Once | INTRAVENOUS | Status: AC
  Administered 2017-04-20: 624 mg via INTRAVENOUS
  Filled 2017-04-20: qty 31.2

## 2017-04-20 MED ORDER — DEXAMETHASONE SODIUM PHOSPHATE 10 MG/ML IJ SOLN
INTRAMUSCULAR | Status: AC
  Filled 2017-04-20: qty 1

## 2017-04-20 MED ORDER — DEXTROSE 5 % IV SOLN
Freq: Once | INTRAVENOUS | Status: AC
  Administered 2017-04-20: 10:00:00 via INTRAVENOUS

## 2017-04-20 MED ORDER — PALONOSETRON HCL INJECTION 0.25 MG/5ML
INTRAVENOUS | Status: AC
  Filled 2017-04-20: qty 5

## 2017-04-20 MED ORDER — DEXAMETHASONE SODIUM PHOSPHATE 10 MG/ML IJ SOLN
10.0000 mg | Freq: Once | INTRAMUSCULAR | Status: AC
  Administered 2017-04-20: 10 mg via INTRAVENOUS

## 2017-04-20 NOTE — Patient Instructions (Signed)
[facility] Discharge Instructions for Patients Receiving Chemotherapy  Today you received the following chemotherapy agents Oxaliplatin, Leucovorin and Adrucil   To help prevent nausea and vomiting after your treatment, we encourage you to take your nausea medication as directed. No Zofran for 3 days. Take Compazine instead.    If you develop nausea and vomiting that is not controlled by your nausea medication, call the clinic.   BELOW ARE SYMPTOMS THAT SHOULD BE REPORTED IMMEDIATELY:  *FEVER GREATER THAN 100.5 F  *CHILLS WITH OR WITHOUT FEVER  NAUSEA AND VOMITING THAT IS NOT CONTROLLED WITH YOUR NAUSEA MEDICATION  *UNUSUAL SHORTNESS OF BREATH  *UNUSUAL BRUISING OR BLEEDING  TENDERNESS IN MOUTH AND THROAT WITH OR WITHOUT PRESENCE OF ULCERS  *URINARY PROBLEMS  *BOWEL PROBLEMS  UNUSUAL RASH Items with * indicate a potential emergency and should be followed up as soon as possible.  Feel free to call the clinic should you have any questions or concerns. The clinic phone number is [phone].  Please show the [place] at check-in to the Emergency Department and triage nurse.

## 2017-04-20 NOTE — Telephone Encounter (Signed)
No LOS 4/17

## 2017-04-22 ENCOUNTER — Inpatient Hospital Stay: Payer: Medicare HMO

## 2017-04-22 VITALS — BP 128/72 | HR 66 | Temp 98.2°F | Resp 17

## 2017-04-22 DIAGNOSIS — C20 Malignant neoplasm of rectum: Secondary | ICD-10-CM

## 2017-04-22 DIAGNOSIS — R630 Anorexia: Secondary | ICD-10-CM | POA: Diagnosis not present

## 2017-04-22 DIAGNOSIS — H538 Other visual disturbances: Secondary | ICD-10-CM | POA: Diagnosis not present

## 2017-04-22 DIAGNOSIS — R197 Diarrhea, unspecified: Secondary | ICD-10-CM | POA: Diagnosis not present

## 2017-04-22 DIAGNOSIS — Z5111 Encounter for antineoplastic chemotherapy: Secondary | ICD-10-CM | POA: Diagnosis not present

## 2017-04-22 DIAGNOSIS — R634 Abnormal weight loss: Secondary | ICD-10-CM | POA: Diagnosis not present

## 2017-04-22 DIAGNOSIS — K769 Liver disease, unspecified: Secondary | ICD-10-CM | POA: Diagnosis not present

## 2017-04-22 DIAGNOSIS — R69 Illness, unspecified: Secondary | ICD-10-CM | POA: Diagnosis not present

## 2017-04-22 DIAGNOSIS — L271 Localized skin eruption due to drugs and medicaments taken internally: Secondary | ICD-10-CM | POA: Diagnosis not present

## 2017-04-22 DIAGNOSIS — D500 Iron deficiency anemia secondary to blood loss (chronic): Secondary | ICD-10-CM | POA: Diagnosis not present

## 2017-04-22 MED ORDER — SODIUM CHLORIDE 0.9% FLUSH
10.0000 mL | INTRAVENOUS | Status: DC | PRN
  Administered 2017-04-22: 10 mL
  Filled 2017-04-22: qty 10

## 2017-04-22 MED ORDER — HEPARIN SOD (PORK) LOCK FLUSH 100 UNIT/ML IV SOLN
500.0000 [IU] | Freq: Once | INTRAVENOUS | Status: AC | PRN
  Administered 2017-04-22: 500 [IU]
  Filled 2017-04-22: qty 5

## 2017-04-26 DIAGNOSIS — C210 Malignant neoplasm of anus, unspecified: Secondary | ICD-10-CM | POA: Diagnosis not present

## 2017-04-27 DIAGNOSIS — Z932 Ileostomy status: Secondary | ICD-10-CM | POA: Diagnosis not present

## 2017-04-27 DIAGNOSIS — C20 Malignant neoplasm of rectum: Secondary | ICD-10-CM | POA: Diagnosis not present

## 2017-05-03 NOTE — Progress Notes (Signed)
[facility]  Telephone:([phone] [phone]) [phone]  Clinic Follow up Note   Patient Care Team: Lucille Passy, MD as PCP - General (Family Medicine) Irene Shipper, MD as Consulting Physician (Gastroenterology) Leighton Ruff, MD as Consulting Physician (General Surgery) Truitt Merle, MD as Consulting Physician (Hematology) Kyung Rudd, MD as Consulting Physician (Radiation Oncology) 05/04/2017  SUMMARY OF ONCOLOGIC HISTORY: Oncology History   Cancer Staging Rectal adenocarcinoma [facility]) Staging form: Colon and Rectum, AJCC 8th Edition - Clinical stage from 07/09/2016: Stage IIA (cT3, cN0, cM0) - Signed by Truitt Merle, MD on 08/16/2016       Rectal adenocarcinoma ([place])   07/09/2016 Pathology Results    Diagnosis Surgical [P], rectum mass - INVASIVE ADENOCARCINOMA. - SEE COMMENT.      07/09/2016 Procedure    Colonoscopy A non-obstructing large friable mass was found in the distal rectum. This began at approximately 1 cm above the anal verge and extended proximal for a distance of 8-10 cm. The lesion was bulky and involved approximately 75% of the luminal circumference. Multiple biopsies were taken. A few small-mouthed diverticula were found in the left colon. The exam was otherwise without abnormality on direct views. Retroflexion not performed purposely.      07/12/2016 Imaging    CT C/A/P with contrast IMPRESSION: Rectal wall thickening/mass, compatible with known rectal adenocarcinoma. No findings specific for metastatic disease in the chest, abdomen, or pelvis. 2.2 cm probable hemangioma in segment 4B, although poorly evaluated. Consider MRI abdomen with/ without multihance contrast for definitive characterization, as clinically warranted. Bilateral adrenal nodules measuring up to 1.6 cm on the right, likely reflecting benign adrenal adenomas, although technically indeterminate. If MRI abdomen is performed, these can be definitively characterized at that time.      07/20/2016 Initial Diagnosis    Rectal adenocarcinoma ([place])      08/03/2016 Imaging    MRI AP W WO Contrast 08/03/16 IMPRESSION: 1. Large rectal mass measures 6.1 cm in length. No obstruction identified. This is compatible with at least a T3bN0M0 lesion. 2. No specific features highly suspicious for nodal metastasis or metastatic disease to the upper abdomen. 3. Indeterminate arterial phase enhancing lesion within the anterior dome of liver measures less than 1 cm. This may represent a benign liver lesions such as FNH or adenoma. Less favored with the hypervascular liver metastasis. Followup imaging at 6 months with repeat MRI of the liver is advise. 4. Bilateral adrenal adenomas      08/04/2016 - 09/16/2016 Radiation Therapy    Concurrent chemo radiation with Dr. [name]      08/04/2016 - 09/16/2016 Chemotherapy    Concurrent chemo radiation with Xeloda, 1500mg  twice daily      12/06/2016 Surgery    ULTRA LOW ANTERIOR RESECTION OF SIGMOID COLON AND RECTUM WITH COLOANAL ANASTAMOSIS AND LOOP ILEOSTOMY  by Dr. Drue Flirt at [facility]  12/06/16      12/06/2016 Pathology Results    FINAL PATHOLOGIC DIAGNOSIS 12/06/16 MICROSCOPIC EXAMINATION AND DIAGNOSIS  A.ANAL CANAL, MUCOSECTOMY: Benign rectal and fibroadipose tissue. Negative for malignancy.  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 Mucinous tumor focally invades through the muscularis propria into pericolorectal tissue. Radial margin interpreted as involved by invasive carcinoma (invasive acellular mucin withrare viable tumor cells focally comes to within 0.2 mm [0.02 cm] of the inked radial soft tissue margin). Treatment effect present, predominantly invasive acellular mucin associated with only very  rare small groups of  viable tumor cells (near complete response). 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 AJCC Pathologic Stage: ypT3 pN1a. See Cancer Case Summary.  C.POSTERIOR ANAL CANAL, EXCISION: Benign rectal tissue. Negative for malignancy.      01/26/2017 -  Chemotherapy    Adjuvant FOLFOX q2 weeks, plan for 6-8 cycles            04/04/2017 Imaging    CT CAP IMPRESSION: New complete interval resolution of bulky rectal soft tissue mass since prior exam.  No evidence of local or distant metastatic disease.     CURRENT THERAPY: AdjuvantFOLFOX q2 weeks began 01/26/17, plan for 8 cycles  INTERVAL HISTORY: Ms. Wulf returns for follow up as scheduled prior to final 8th cycle FOLFOX. She had more frequent nausea that lasted longer than with previous cycles. Nausea was fairly managed with alternating zofran and compazine, lasting approximately 10 days. Stool output is at her baseline, still requires frequent emptying around the clock, she is using lomotil as prescribed. No mucositis or hand-foot syndrome. Cold sensitivity lasted longer with cycle 7, about 10 days. No residual neuropathy after cold sensitivity subsided. Eyes remain slightly blurry, no double vision. Can still read and drive without much difficulty. Has not made ophthalmology appointment yet.   REVIEW OF SYSTEMS:   Constitutional: Denies fevers, chills or abnormal weight loss Eyes: denies double vision (+) blurriness of vision Ears, nose, mouth, throat, and face: Denies mucositis or sore throat Respiratory: Denies cough, dyspnea or wheezes Cardiovascular: Denies palpitation, chest discomfort or lower extremity swelling Gastrointestinal:  Denies vomiting, constipation, heartburn or change in bowel habits (+) prolonged nausea, lasting 10  days (+) frequent ileostomy output Skin: Denies abnormal skin rashes Lymphatics: Denies new lymphadenopathy or easy bruising Neurological:Denies numbness, tingling or new weaknesses (+) prolonged cold sensitivity, lasting 10 days   Behavioral/Psych: Mood is stable, no new changes  All other systems were reviewed with the patient and are negative.  MEDICAL HISTORY:  Past Medical History:  Diagnosis Date  . Coronary artery disease 07/2003  . Myocardial infarction ([place])   . Obesity 2003   s/p gastric bypass     SURGICAL HISTORY: Past Surgical History:  Procedure Laterality Date  . CHOLECYSTECTOMY  1999  . GASTRIC BYPASS  2003  . IR FLUORO GUIDE PORT INSERTION RIGHT  01/25/2017  . IR US GUIDE VASC ACCESS RIGHT  01/25/2017    I have reviewed the social history and family history with the patient and they are unchanged from previous note.  ALLERGIES:  is allergic to bactrim [sulfamethoxazole-trimethoprim]; phenergan [promethazine hcl]; sulfa antibiotics; and penicillins.  MEDICATIONS:  Current Outpatient Medications  Medication Sig Dispense Refill  . aspirin 81 MG chewable tablet Chew 81 mg by mouth daily.    . cholecalciferol (VITAMIN D) 1000 UNITS tablet Take 1,000 Units by mouth daily.      . diphenoxylate-atropine (LOMOTIL) 2.5-0.025 MG tablet TAKE 1 TO 2 TABLETS BY MOUTH FOUR TIMES DAILY AS NEEDED 120 tablet 1  . ferrous sulfate (FERROUSUL) 325 (65 FE) MG tablet Take 1 tablet (325 mg total) by mouth daily with breakfast. (Patient taking differently: Take 325 mg by mouth. Take two by mouth daily) 30 tablet 6  . ibuprofen (ADVIL,MOTRIN) 200 MG tablet Take 200 mg by mouth every 6 (six) hours as needed.    . mirtazapine (REMERON) 15 MG tablet TAKE 1 TABLET(15  MG) BY MOUTH AT BEDTIME 30 tablet 0  . Multiple Vitamin (MULTIVITAMIN) tablet Take 1 tablet by mouth daily.    . ondansetron (ZOFRAN) 8 MG tablet Take 1 tablet (8 mg total) by mouth every 8 (eight) hours as needed for nausea or  vomiting. 20 tablet 1  . prochlorperazine (COMPAZINE) 10 MG tablet Take 1 tablet (10 mg total) by mouth every 6 (six) hours as needed for nausea or vomiting. 30 tablet 1  . urea (CARMOL) 10 % cream Apply topically as needed. 71 g 1  . LORazepam (ATIVAN) 0.5 MG tablet Take 1 tablet (0.5 mg total) by mouth every 8 (eight) hours as needed (nausea and vomiting). 15 tablet 0  . traMADol (ULTRAM) 50 MG tablet Take 1 tablet (50 mg total) by mouth every 6 (six) hours as needed. (Patient not taking: Reported on 04/20/2017) 90 tablet 0   Current Facility-Administered Medications  Medication Dose Route Frequency Provider Last Rate Last Dose  . 0.9 %  sodium chloride infusion  500 mL Intravenous Continuous Irene Shipper, MD       Facility-Administered Medications Ordered in Other Visits  Medication Dose Route Frequency Provider Last Rate Last Dose  . fluorouracil (ADRUCIL) 3,450 mg in sodium chloride 0.9 % 81 mL chemo infusion  2,200 mg/m2 (Treatment Plan Recorded) Intravenous 1 day or 1 dose Truitt Merle, MD   3,450 mg at 05/04/17 1244  . Tbo-Filgrastim (GRANIX) injection 480 mcg  480 mcg Subcutaneous Once Truitt Merle, MD        PHYSICAL EXAMINATION: ECOG PERFORMANCE STATUS: 1 - Symptomatic but completely ambulatory  Vitals:   05/04/17 0836  BP: 135/81  Pulse: 71  Resp: 18  Temp: 98 F (36.7 C)  SpO2: 100%   Filed Weights   05/04/17 0836  Weight: 128 lb 1.6 oz (58.1 kg)    GENERAL:alert, no distress and comfortable SKIN: skin color, texture, turgor are normal, no rashes or significant lesions EYES: normal, Conjunctiva are pink and non-injected, sclera clear OROPHARYNX:no exudate, no erythema and lips, buccal mucosa, and tongue normal  LYMPH:  no palpable cervical or supraclavicular lymphadenopathy  LUNGS: clear to auscultation with normal breathing effort HEART: regular rate & rhythm and no murmurs and no lower extremity edema ABDOMEN:abdomen soft, non-tender and normal bowel sounds (+) right  abd ileostomy  Musculoskeletal:no cyanosis of digits and no clubbing  NEURO: alert & oriented x 3 with fluent speech, no focal motor/sensory deficits PAC without erythema    LABORATORY DATA:  I have reviewed the data as listed CBC Latest Ref Rng & Units 05/04/2017 04/20/2017 04/06/2017  WBC 3.9 - 10.3 K/uL 4.2 3.5(L) 3.1(L)  Hemoglobin 11.6 - 15.9 g/dL 9.7(L) 9.8(L) 9.9(L)  Hematocrit 34.8 - 46.6 % 29.7(L) 30.6(L) 30.5(L)  Platelets 145 - 400 K/uL 106(L) 122(L) 104(L)     CMP Latest Ref Rng & Units 05/04/2017 04/20/2017 04/06/2017  Glucose 70 - 140 mg/dL 100 79 77  BUN 7 - 26 mg/dL 16 11 16   Creatinine 0.60 - 1.10 mg/dL 0.62 0.69 0.68  Sodium 136 - 145 mmol/L 139 138 139  Potassium 3.5 - 5.1 mmol/L 4.0 4.6 4.6  Chloride 98 - 109 mmol/L 111(H) 109 109  CO2 22 - 29 mmol/L 21(L) 25 23  Calcium 8.4 - 10.4 mg/dL 8.9 9.2 8.9  Total Protein 6.4 - 8.3 g/dL 5.9(L) 6.0(L) 5.7(L)  Total Bilirubin 0.2 - 1.2 mg/dL 0.3 <0.2(L) 0.3  Alkaline Phos 40 - 150 U/L 113 101 84  AST 5 - 34  U/L 41(H) 44(H) 35(H)  ALT 0 - 55 U/L 33 40 36      RADIOGRAPHIC STUDIES: I have personally reviewed the radiological images as listed and agreed with the findings in the report. No results found.   ASSESSMENT & PLAN: Rory Montel Weismilleris a 67 y.o.woman with past medical history of gastric bypass surgery, presented with rectal bleeding, loose bowel movement and stool incontinence for one half years, was recently diagnosed invasive rectal adenocarcinoma.  1. Invasive low rectal adenocarcinoma, cT3N0M0, ypT3N1a, near complete response, (+) margin  2. Iron deficient anemia 3. Genetics 4. H/o gastric bypass surgery 2003 5. H/o DM, HTN 6. Liver lesion 7. Anorexia, weight loss 8. Diarrhea, loose stool 9. Depression 10. Peripheral neuropathy, G1 11. Hand/foot syndrome, secondary to chemotherapy especially 5FU 12. Blurry vision, secondary to chemotherapy  Ms. Clayson appears stable. She completed 7 cycles  adjuvant FOLFOX. She is tolerating reasonably well. She has prolonged nausea without vomiting after cycle 7 despite alternating compazine and zofran. I prescribed ativan PRN for additional relief. She continues to have some blurry vision but she prefers to wait for eye appointment until she is well past chemotherapy. Otherwise she is doing well. Labs reviewed, iron studies are adequate. CBC and CMP reviewed, anemia and thrombocytopenia stable. Iron studies are stable. Labs adequate to proceed with final cycle 8 FOLFOX today.  After she completes chemotherapy today we will begin surveillance plan. She plans to undergo closure of loop ileostomy on 06/17/17. Will return for lab and f/u in 2-3 months with surveillance scan few days prior to office visit.   PLAN: -Labs reviewed, proceed with final cycle 8 adjuvant FOLFOX today -Rx: ativan for nausea -Surveillance CT CAP, lab, f/u Dr. Burr Medico in 2-3 months  (ordered today) -flush appointment monthly x2  All questions were answered. The patient knows to call the clinic with any problems, questions or concerns. No barriers to learning was detected. I spent 20 minutes counseling the patient face to face. The total time spent in the appointment was 25 minutes and more than 50% was on counseling and review of test results     Alla Feeling, NP 05/04/17

## 2017-05-04 ENCOUNTER — Encounter: Payer: Self-pay | Admitting: Nurse Practitioner

## 2017-05-04 ENCOUNTER — Inpatient Hospital Stay (HOSPITAL_BASED_OUTPATIENT_CLINIC_OR_DEPARTMENT_OTHER): Payer: Medicare HMO | Admitting: Nurse Practitioner

## 2017-05-04 ENCOUNTER — Telehealth: Payer: Self-pay

## 2017-05-04 ENCOUNTER — Inpatient Hospital Stay: Payer: Medicare HMO

## 2017-05-04 ENCOUNTER — Inpatient Hospital Stay: Payer: Medicare HMO | Attending: Hematology

## 2017-05-04 VITALS — BP 135/81 | HR 71 | Temp 98.0°F | Resp 18 | Ht 60.0 in | Wt 128.1 lb

## 2017-05-04 DIAGNOSIS — C20 Malignant neoplasm of rectum: Secondary | ICD-10-CM | POA: Insufficient documentation

## 2017-05-04 DIAGNOSIS — H538 Other visual disturbances: Secondary | ICD-10-CM | POA: Diagnosis not present

## 2017-05-04 DIAGNOSIS — R197 Diarrhea, unspecified: Secondary | ICD-10-CM | POA: Diagnosis not present

## 2017-05-04 DIAGNOSIS — R110 Nausea: Secondary | ICD-10-CM | POA: Diagnosis not present

## 2017-05-04 DIAGNOSIS — C210 Malignant neoplasm of anus, unspecified: Secondary | ICD-10-CM | POA: Diagnosis not present

## 2017-05-04 DIAGNOSIS — Z7982 Long term (current) use of aspirin: Secondary | ICD-10-CM | POA: Insufficient documentation

## 2017-05-04 DIAGNOSIS — Z5111 Encounter for antineoplastic chemotherapy: Secondary | ICD-10-CM | POA: Diagnosis present

## 2017-05-04 DIAGNOSIS — Z9884 Bariatric surgery status: Secondary | ICD-10-CM

## 2017-05-04 DIAGNOSIS — Z5189 Encounter for other specified aftercare: Secondary | ICD-10-CM | POA: Diagnosis present

## 2017-05-04 DIAGNOSIS — T451X5A Adverse effect of antineoplastic and immunosuppressive drugs, initial encounter: Secondary | ICD-10-CM

## 2017-05-04 DIAGNOSIS — Z95828 Presence of other vascular implants and grafts: Secondary | ICD-10-CM

## 2017-05-04 DIAGNOSIS — D500 Iron deficiency anemia secondary to blood loss (chronic): Secondary | ICD-10-CM

## 2017-05-04 DIAGNOSIS — Z79899 Other long term (current) drug therapy: Secondary | ICD-10-CM

## 2017-05-04 DIAGNOSIS — Z452 Encounter for adjustment and management of vascular access device: Secondary | ICD-10-CM | POA: Insufficient documentation

## 2017-05-04 DIAGNOSIS — K909 Intestinal malabsorption, unspecified: Secondary | ICD-10-CM

## 2017-05-04 LAB — COMPREHENSIVE METABOLIC PANEL
ALK PHOS: 113 U/L (ref 40–150)
ALT: 33 U/L (ref 0–55)
ANION GAP: 7 (ref 3–11)
AST: 41 U/L — ABNORMAL HIGH (ref 5–34)
Albumin: 3 g/dL — ABNORMAL LOW (ref 3.5–5.0)
BILIRUBIN TOTAL: 0.3 mg/dL (ref 0.2–1.2)
BUN: 16 mg/dL (ref 7–26)
CALCIUM: 8.9 mg/dL (ref 8.4–10.4)
CO2: 21 mmol/L — ABNORMAL LOW (ref 22–29)
Chloride: 111 mmol/L — ABNORMAL HIGH (ref 98–109)
Creatinine, Ser: 0.62 mg/dL (ref 0.60–1.10)
GFR calc Af Amer: >60 mL/min (ref >60)
GLUCOSE: 100 mg/dL (ref 70–140)
POTASSIUM: 4 mmol/L (ref 3.5–5.1)
Sodium: 139 mmol/L (ref 136–145)
TOTAL PROTEIN: 5.9 g/dL — AB (ref 6.4–8.3)

## 2017-05-04 LAB — CBC WITH DIFFERENTIAL/PLATELET
Basophils Absolute: 0 10*3/uL (ref 0.0–0.1)
Basophils Relative: 0 %
EOS ABS: 0 10*3/uL (ref 0.0–0.5)
EOS PCT: 1 %
HCT: 29.7 % — ABNORMAL LOW (ref 34.8–46.6)
HEMOGLOBIN: 9.7 g/dL — AB (ref 11.6–15.9)
LYMPHS ABS: 0.8 10*3/uL — AB (ref 0.9–3.3)
Lymphocytes Relative: 18 %
MCH: 30.6 pg (ref 25.1–34.0)
MCHC: 32.7 g/dL (ref 31.5–36.0)
MCV: 93.7 fL (ref 79.5–101.0)
MONO ABS: 0.2 10*3/uL (ref 0.1–0.9)
MONOS PCT: 5 %
Neutro Abs: 3.1 10*3/uL (ref 1.5–6.5)
Neutrophils Relative %: 76 %
PLATELETS: 106 10*3/uL — AB (ref 145–400)
RBC: 3.17 MIL/uL — ABNORMAL LOW (ref 3.70–5.45)
RDW: 18.9 % — AB (ref 11.2–14.5)
WBC: 4.2 10*3/uL (ref 3.9–10.3)

## 2017-05-04 LAB — RETICULOCYTES
RBC.: 3.17 MIL/uL — ABNORMAL LOW (ref 3.70–5.45)
RETIC CT PCT: 2.6 % — AB (ref 0.7–2.1)
Retic Count, Absolute: 82.4 10*3/uL (ref 33.7–90.7)

## 2017-05-04 LAB — FERRITIN: Ferritin: 357 ng/mL — ABNORMAL HIGH (ref 9–269)

## 2017-05-04 LAB — IRON AND TIBC
IRON: 82 ug/dL (ref 41–142)
Saturation Ratios: 28 % (ref 21–57)
TIBC: 291 ug/dL (ref 236–444)
UIBC: 210 ug/dL

## 2017-05-04 MED ORDER — FLUOROURACIL CHEMO INJECTION 5 GM/100ML
2200.0000 mg/m2 | INTRAVENOUS | Status: DC
  Administered 2017-05-04: 3450 mg via INTRAVENOUS
  Filled 2017-05-04: qty 69

## 2017-05-04 MED ORDER — DEXTROSE 5 % IV SOLN
Freq: Once | INTRAVENOUS | Status: AC
  Administered 2017-05-04: 09:00:00 via INTRAVENOUS

## 2017-05-04 MED ORDER — OXALIPLATIN CHEMO INJECTION 100 MG/20ML
70.0000 mg/m2 | Freq: Once | INTRAVENOUS | Status: AC
  Administered 2017-05-04: 110 mg via INTRAVENOUS
  Filled 2017-05-04: qty 2

## 2017-05-04 MED ORDER — DEXAMETHASONE SODIUM PHOSPHATE 10 MG/ML IJ SOLN
10.0000 mg | Freq: Once | INTRAMUSCULAR | Status: AC
  Administered 2017-05-04: 10 mg via INTRAVENOUS

## 2017-05-04 MED ORDER — SODIUM CHLORIDE 0.9% FLUSH
10.0000 mL | Freq: Once | INTRAVENOUS | Status: AC
  Administered 2017-05-04: 10 mL
  Filled 2017-05-04: qty 10

## 2017-05-04 MED ORDER — LEUCOVORIN CALCIUM INJECTION 350 MG
400.0000 mg/m2 | Freq: Once | INTRAVENOUS | Status: AC
  Administered 2017-05-04: 624 mg via INTRAVENOUS
  Filled 2017-05-04: qty 31.2

## 2017-05-04 MED ORDER — PALONOSETRON HCL INJECTION 0.25 MG/5ML
INTRAVENOUS | Status: AC
  Filled 2017-05-04: qty 5

## 2017-05-04 MED ORDER — LORAZEPAM 0.5 MG PO TABS: 0.5000 mg | ORAL_TABLET | Freq: Three times a day (TID) | ORAL | 0 refills | Status: DC | PRN

## 2017-05-04 MED ORDER — DEXAMETHASONE SODIUM PHOSPHATE 10 MG/ML IJ SOLN
INTRAMUSCULAR | Status: AC
  Filled 2017-05-04: qty 1

## 2017-05-04 MED ORDER — PALONOSETRON HCL INJECTION 0.25 MG/5ML
0.2500 mg | Freq: Once | INTRAVENOUS | Status: AC
  Administered 2017-05-04: 0.25 mg via INTRAVENOUS

## 2017-05-04 NOTE — Patient Instructions (Signed)
[facility] Discharge Instructions for Patients Receiving Chemotherapy  Today you received the following chemotherapy agents Oxaliplatin, Leucovorin and Adrucil   To help prevent nausea and vomiting after your treatment, we encourage you to take your nausea medication as directed. No Zofran for 3 days. Take Compazine instead.    If you develop nausea and vomiting that is not controlled by your nausea medication, call the clinic.   BELOW ARE SYMPTOMS THAT SHOULD BE REPORTED IMMEDIATELY:  *FEVER GREATER THAN 100.5 F  *CHILLS WITH OR WITHOUT FEVER  NAUSEA AND VOMITING THAT IS NOT CONTROLLED WITH YOUR NAUSEA MEDICATION  *UNUSUAL SHORTNESS OF BREATH  *UNUSUAL BRUISING OR BLEEDING  TENDERNESS IN MOUTH AND THROAT WITH OR WITHOUT PRESENCE OF ULCERS  *URINARY PROBLEMS  *BOWEL PROBLEMS  UNUSUAL RASH Items with * indicate a potential emergency and should be followed up as soon as possible.  Feel free to call the clinic should you have any questions or concerns. The clinic phone number is [phone].  Please show the [place] at check-in to the Emergency Department and triage nurse.

## 2017-05-04 NOTE — Telephone Encounter (Signed)
Printed avs and calender of upcoming appointment. Per 5/1 los 

## 2017-05-06 ENCOUNTER — Inpatient Hospital Stay (HOSPITAL_BASED_OUTPATIENT_CLINIC_OR_DEPARTMENT_OTHER): Payer: Medicare HMO

## 2017-05-06 VITALS — BP 135/80 | HR 75 | Temp 98.0°F | Resp 18

## 2017-05-06 DIAGNOSIS — H538 Other visual disturbances: Secondary | ICD-10-CM | POA: Diagnosis not present

## 2017-05-06 DIAGNOSIS — Z79899 Other long term (current) drug therapy: Secondary | ICD-10-CM | POA: Diagnosis not present

## 2017-05-06 DIAGNOSIS — C20 Malignant neoplasm of rectum: Secondary | ICD-10-CM

## 2017-05-06 DIAGNOSIS — D500 Iron deficiency anemia secondary to blood loss (chronic): Secondary | ICD-10-CM | POA: Diagnosis not present

## 2017-05-06 DIAGNOSIS — Z9884 Bariatric surgery status: Secondary | ICD-10-CM | POA: Diagnosis not present

## 2017-05-06 DIAGNOSIS — R110 Nausea: Secondary | ICD-10-CM | POA: Diagnosis not present

## 2017-05-06 DIAGNOSIS — Z452 Encounter for adjustment and management of vascular access device: Secondary | ICD-10-CM | POA: Diagnosis not present

## 2017-05-06 DIAGNOSIS — R197 Diarrhea, unspecified: Secondary | ICD-10-CM | POA: Diagnosis not present

## 2017-05-06 DIAGNOSIS — Z7982 Long term (current) use of aspirin: Secondary | ICD-10-CM | POA: Diagnosis not present

## 2017-05-06 DIAGNOSIS — Z5111 Encounter for antineoplastic chemotherapy: Secondary | ICD-10-CM | POA: Diagnosis not present

## 2017-05-06 MED ORDER — SODIUM CHLORIDE 0.9% FLUSH
10.0000 mL | INTRAVENOUS | Status: DC | PRN
  Administered 2017-05-06: 10 mL
  Filled 2017-05-06: qty 10

## 2017-05-06 MED ORDER — HEPARIN SOD (PORK) LOCK FLUSH 100 UNIT/ML IV SOLN
500.0000 [IU] | Freq: Once | INTRAVENOUS | Status: AC | PRN
  Administered 2017-05-06: 500 [IU]
  Filled 2017-05-06: qty 5

## 2017-05-11 ENCOUNTER — Ambulatory Visit: Payer: Medicare HMO

## 2017-05-11 ENCOUNTER — Other Ambulatory Visit: Payer: Medicare HMO

## 2017-05-11 ENCOUNTER — Ambulatory Visit: Payer: Medicare HMO | Admitting: Hematology

## 2017-05-19 ENCOUNTER — Encounter: Payer: Self-pay | Admitting: Internal Medicine

## 2017-06-01 ENCOUNTER — Inpatient Hospital Stay: Payer: Medicare HMO

## 2017-06-01 VITALS — BP 120/88 | HR 85 | Temp 98.4°F | Resp 20

## 2017-06-01 DIAGNOSIS — R197 Diarrhea, unspecified: Secondary | ICD-10-CM | POA: Diagnosis not present

## 2017-06-01 DIAGNOSIS — Z7982 Long term (current) use of aspirin: Secondary | ICD-10-CM | POA: Diagnosis not present

## 2017-06-01 DIAGNOSIS — H538 Other visual disturbances: Secondary | ICD-10-CM | POA: Diagnosis not present

## 2017-06-01 DIAGNOSIS — Z95828 Presence of other vascular implants and grafts: Secondary | ICD-10-CM

## 2017-06-01 DIAGNOSIS — Z5111 Encounter for antineoplastic chemotherapy: Secondary | ICD-10-CM | POA: Diagnosis not present

## 2017-06-01 DIAGNOSIS — C20 Malignant neoplasm of rectum: Secondary | ICD-10-CM | POA: Diagnosis not present

## 2017-06-01 DIAGNOSIS — Z79899 Other long term (current) drug therapy: Secondary | ICD-10-CM | POA: Diagnosis not present

## 2017-06-01 DIAGNOSIS — D500 Iron deficiency anemia secondary to blood loss (chronic): Secondary | ICD-10-CM

## 2017-06-01 DIAGNOSIS — Z452 Encounter for adjustment and management of vascular access device: Secondary | ICD-10-CM | POA: Diagnosis not present

## 2017-06-01 DIAGNOSIS — Z9884 Bariatric surgery status: Secondary | ICD-10-CM | POA: Diagnosis not present

## 2017-06-01 DIAGNOSIS — R110 Nausea: Secondary | ICD-10-CM | POA: Diagnosis not present

## 2017-06-01 MED ORDER — SODIUM CHLORIDE 0.9% FLUSH
10.0000 mL | Freq: Once | INTRAVENOUS | Status: AC
  Administered 2017-06-01: 10 mL
  Filled 2017-06-01: qty 10

## 2017-06-01 MED ORDER — HEPARIN SOD (PORK) LOCK FLUSH 100 UNIT/ML IV SOLN
250.0000 [IU] | Freq: Once | INTRAVENOUS | Status: AC | PRN
  Administered 2017-06-01: 100 [IU]
  Filled 2017-06-01: qty 5

## 2017-06-01 NOTE — Patient Instructions (Signed)
Implanted Port Home Guide An implanted port is a type of central line that is placed under the skin. Central lines are used to provide IV access when treatment or nutrition needs to be given through a person's veins. Implanted ports are used for long-term IV access. An implanted port may be placed because:  You need IV medicine that would be irritating to the small veins in your hands or arms.  You need long-term IV medicines, such as antibiotics.  You need IV nutrition for a long period.  You need frequent blood draws for lab tests.  You need dialysis.  Implanted ports are usually placed in the chest area, but they can also be placed in the upper arm, the abdomen, or the leg. An implanted port has two main parts:  Reservoir. The reservoir is round and will appear as a small, raised area under your skin. The reservoir is the part where a needle is inserted to give medicines or draw blood.  Catheter. The catheter is a thin, flexible tube that extends from the reservoir. The catheter is placed into a large vein. Medicine that is inserted into the reservoir goes into the catheter and then into the vein.  How will I care for my incision site? Do not get the incision site wet. Bathe or shower as directed by your health care provider. How is my port accessed? Special steps must be taken to access the port:  Before the port is accessed, a numbing cream can be placed on the skin. This helps numb the skin over the port site.  Your health care provider uses a sterile technique to access the port. ? Your health care provider must put on a mask and sterile gloves. ? The skin over your port is cleaned carefully with an antiseptic and allowed to dry. ? The port is gently pinched between sterile gloves, and a needle is inserted into the port.  Only "non-coring" port needles should be used to access the port. Once the port is accessed, a blood return should be checked. This helps ensure that the port  is in the vein and is not clogged.  If your port needs to remain accessed for a constant infusion, a clear (transparent) bandage will be placed over the needle site. The bandage and needle will need to be changed every week, or as directed by your health care provider.  Keep the bandage covering the needle clean and dry. Do not get it wet. Follow your health care provider's instructions on how to take a shower or bath while the port is accessed.  If your port does not need to stay accessed, no bandage is needed over the port.  What is flushing? Flushing helps keep the port from getting clogged. Follow your health care provider's instructions on how and when to flush the port. Ports are usually flushed with saline solution or a medicine called heparin. The need for flushing will depend on how the port is used.  If the port is used for intermittent medicines or blood draws, the port will need to be flushed: ? After medicines have been given. ? After blood has been drawn. ? As part of routine maintenance.  If a constant infusion is running, the port may not need to be flushed.  How long will my port stay implanted? The port can stay in for as long as your health care provider thinks it is needed. When it is time for the port to come out, surgery will be   done to remove it. The procedure is similar to the one performed when the port was put in. When should I seek immediate medical care? When you have an implanted port, you should seek immediate medical care if:  You notice a bad smell coming from the incision site.  You have swelling, redness, or drainage at the incision site.  You have more swelling or pain at the port site or the surrounding area.  You have a fever that is not controlled with medicine.  This information is not intended to replace advice given to you by your health care provider. Make sure you discuss any questions you have with your health care provider. Document  Released: 12/21/2004 Document Revised: 05/29/2015 Document Reviewed: 08/28/2012 Elsevier Interactive Patient Education  2017 [employer]vier Inc.  

## 2017-06-07 DIAGNOSIS — Z432 Encounter for attention to ileostomy: Secondary | ICD-10-CM | POA: Diagnosis not present

## 2017-06-07 DIAGNOSIS — D509 Iron deficiency anemia, unspecified: Secondary | ICD-10-CM | POA: Diagnosis not present

## 2017-06-07 DIAGNOSIS — C20 Malignant neoplasm of rectum: Secondary | ICD-10-CM | POA: Diagnosis not present

## 2017-06-07 DIAGNOSIS — Z8049 Family history of malignant neoplasm of other genital organs: Secondary | ICD-10-CM | POA: Diagnosis not present

## 2017-06-07 DIAGNOSIS — Z85048 Personal history of other malignant neoplasm of rectum, rectosigmoid junction, and anus: Secondary | ICD-10-CM | POA: Diagnosis not present

## 2017-06-07 DIAGNOSIS — Z9884 Bariatric surgery status: Secondary | ICD-10-CM | POA: Diagnosis not present

## 2017-06-07 DIAGNOSIS — Z800 Family history of malignant neoplasm of digestive organs: Secondary | ICD-10-CM | POA: Diagnosis not present

## 2017-06-07 DIAGNOSIS — Z932 Ileostomy status: Secondary | ICD-10-CM | POA: Diagnosis not present

## 2017-06-07 DIAGNOSIS — Z9221 Personal history of antineoplastic chemotherapy: Secondary | ICD-10-CM | POA: Diagnosis not present

## 2017-06-13 ENCOUNTER — Ambulatory Visit (HOSPITAL_COMMUNITY): Payer: Medicare HMO

## 2017-06-13 DIAGNOSIS — Z9889 Other specified postprocedural states: Secondary | ICD-10-CM | POA: Insufficient documentation

## 2017-06-13 DIAGNOSIS — D509 Iron deficiency anemia, unspecified: Secondary | ICD-10-CM | POA: Diagnosis not present

## 2017-06-13 DIAGNOSIS — Z85048 Personal history of other malignant neoplasm of rectum, rectosigmoid junction, and anus: Secondary | ICD-10-CM | POA: Diagnosis not present

## 2017-06-13 DIAGNOSIS — Z9884 Bariatric surgery status: Secondary | ICD-10-CM | POA: Diagnosis not present

## 2017-06-13 DIAGNOSIS — Z432 Encounter for attention to ileostomy: Secondary | ICD-10-CM | POA: Diagnosis not present

## 2017-06-13 DIAGNOSIS — Z8049 Family history of malignant neoplasm of other genital organs: Secondary | ICD-10-CM | POA: Diagnosis not present

## 2017-06-13 DIAGNOSIS — Z9221 Personal history of antineoplastic chemotherapy: Secondary | ICD-10-CM | POA: Diagnosis not present

## 2017-06-13 DIAGNOSIS — Z800 Family history of malignant neoplasm of digestive organs: Secondary | ICD-10-CM | POA: Diagnosis not present

## 2017-06-24 ENCOUNTER — Other Ambulatory Visit: Payer: Self-pay | Admitting: Nurse Practitioner

## 2017-06-24 ENCOUNTER — Telehealth: Payer: Self-pay | Admitting: Nurse Practitioner

## 2017-06-24 DIAGNOSIS — C20 Malignant neoplasm of rectum: Secondary | ICD-10-CM

## 2017-06-24 NOTE — Telephone Encounter (Signed)
Patient had CT scheduled 6/24. I reviewed timeline with Dr. Burr Medico who recommends to delay CT until 10/2017, which would be 6 months from previous imaging. Patient had surgery last week, she is having diarrhea and glad not to have to drink contrast. I will personally cancel lab and CT on 06/27/17, she is aware of appts on 06/29/17 for lab, flush, and MD f/u. She appreciates the call.

## 2017-06-27 ENCOUNTER — Other Ambulatory Visit: Payer: Medicare HMO

## 2017-06-27 ENCOUNTER — Ambulatory Visit (HOSPITAL_COMMUNITY): Admission: RE | Admit: 2017-06-27 | Payer: Medicare HMO | Source: Ambulatory Visit

## 2017-06-27 ENCOUNTER — Encounter (HOSPITAL_COMMUNITY): Payer: Self-pay

## 2017-06-29 ENCOUNTER — Telehealth: Payer: Self-pay | Admitting: Emergency Medicine

## 2017-06-29 ENCOUNTER — Encounter: Payer: Self-pay | Admitting: Hematology

## 2017-06-29 ENCOUNTER — Inpatient Hospital Stay: Payer: Medicare HMO | Attending: Hematology

## 2017-06-29 ENCOUNTER — Inpatient Hospital Stay: Payer: Medicare HMO

## 2017-06-29 ENCOUNTER — Telehealth: Payer: Self-pay | Admitting: Hematology

## 2017-06-29 ENCOUNTER — Inpatient Hospital Stay: Payer: Medicare HMO | Admitting: Hematology

## 2017-06-29 VITALS — BP 147/74 | HR 73 | Temp 98.7°F | Resp 18 | Ht 60.0 in | Wt 128.9 lb

## 2017-06-29 DIAGNOSIS — Z9884 Bariatric surgery status: Secondary | ICD-10-CM | POA: Insufficient documentation

## 2017-06-29 DIAGNOSIS — C787 Secondary malignant neoplasm of liver and intrahepatic bile duct: Secondary | ICD-10-CM | POA: Diagnosis not present

## 2017-06-29 DIAGNOSIS — C20 Malignant neoplasm of rectum: Secondary | ICD-10-CM | POA: Insufficient documentation

## 2017-06-29 DIAGNOSIS — K909 Intestinal malabsorption, unspecified: Secondary | ICD-10-CM

## 2017-06-29 DIAGNOSIS — Z800 Family history of malignant neoplasm of digestive organs: Secondary | ICD-10-CM | POA: Insufficient documentation

## 2017-06-29 DIAGNOSIS — Z9221 Personal history of antineoplastic chemotherapy: Secondary | ICD-10-CM | POA: Insufficient documentation

## 2017-06-29 DIAGNOSIS — Z8049 Family history of malignant neoplasm of other genital organs: Secondary | ICD-10-CM | POA: Insufficient documentation

## 2017-06-29 DIAGNOSIS — Z79899 Other long term (current) drug therapy: Secondary | ICD-10-CM

## 2017-06-29 DIAGNOSIS — R197 Diarrhea, unspecified: Secondary | ICD-10-CM | POA: Diagnosis not present

## 2017-06-29 DIAGNOSIS — D509 Iron deficiency anemia, unspecified: Secondary | ICD-10-CM | POA: Insufficient documentation

## 2017-06-29 DIAGNOSIS — F329 Major depressive disorder, single episode, unspecified: Secondary | ICD-10-CM | POA: Insufficient documentation

## 2017-06-29 DIAGNOSIS — Z7982 Long term (current) use of aspirin: Secondary | ICD-10-CM | POA: Diagnosis not present

## 2017-06-29 DIAGNOSIS — D500 Iron deficiency anemia secondary to blood loss (chronic): Secondary | ICD-10-CM

## 2017-06-29 DIAGNOSIS — Z95828 Presence of other vascular implants and grafts: Secondary | ICD-10-CM

## 2017-06-29 DIAGNOSIS — Z808 Family history of malignant neoplasm of other organs or systems: Secondary | ICD-10-CM

## 2017-06-29 DIAGNOSIS — R69 Illness, unspecified: Secondary | ICD-10-CM | POA: Diagnosis not present

## 2017-06-29 DIAGNOSIS — Z923 Personal history of irradiation: Secondary | ICD-10-CM | POA: Insufficient documentation

## 2017-06-29 DIAGNOSIS — Z452 Encounter for adjustment and management of vascular access device: Secondary | ICD-10-CM | POA: Diagnosis present

## 2017-06-29 LAB — CBC WITH DIFFERENTIAL (CANCER CENTER ONLY)
BASOS ABS: 0 10*3/uL (ref 0.0–0.1)
Basophils Relative: 0 %
EOS ABS: 0.1 10*3/uL (ref 0.0–0.5)
EOS PCT: 1 %
HCT: 30.5 % — ABNORMAL LOW (ref 34.8–46.6)
HEMOGLOBIN: 9.9 g/dL — AB (ref 11.6–15.9)
LYMPHS PCT: 5 %
Lymphs Abs: 0.5 10*3/uL — ABNORMAL LOW (ref 0.9–3.3)
MCH: 31.9 pg (ref 25.1–34.0)
MCHC: 32.5 g/dL (ref 31.5–36.0)
MCV: 98.4 fL (ref 79.5–101.0)
Monocytes Absolute: 0.5 10*3/uL (ref 0.1–0.9)
Monocytes Relative: 6 %
NEUTROS PCT: 88 %
Neutro Abs: 8.1 10*3/uL — ABNORMAL HIGH (ref 1.5–6.5)
PLATELETS: 258 10*3/uL (ref 145–400)
RBC: 3.1 MIL/uL — ABNORMAL LOW (ref 3.70–5.45)
RDW: 13.5 % (ref 11.2–14.5)
WBC Count: 9.2 10*3/uL (ref 3.9–10.3)

## 2017-06-29 LAB — COMPREHENSIVE METABOLIC PANEL
ALT: 25 U/L (ref 0–44)
AST: 29 U/L (ref 15–41)
Albumin: 3.3 g/dL — ABNORMAL LOW (ref 3.5–5.0)
Alkaline Phosphatase: 122 U/L (ref 38–126)
Anion gap: 6 (ref 5–15)
BILIRUBIN TOTAL: 0.2 mg/dL — AB (ref 0.3–1.2)
BUN: 17 mg/dL (ref 8–23)
CO2: 24 mmol/L (ref 22–32)
CREATININE: 0.59 mg/dL (ref 0.44–1.00)
Calcium: 9.2 mg/dL (ref 8.9–10.3)
Chloride: 110 mmol/L (ref 98–111)
Glucose, Bld: 94 mg/dL (ref 70–99)
POTASSIUM: 3.9 mmol/L (ref 3.5–5.1)
Sodium: 140 mmol/L (ref 135–145)
TOTAL PROTEIN: 6.1 g/dL — AB (ref 6.5–8.1)

## 2017-06-29 LAB — RETICULOCYTES
RBC.: 3.1 MIL/uL — ABNORMAL LOW (ref 3.70–5.45)
RETIC COUNT ABSOLUTE: 58.9 10*3/uL (ref 33.7–90.7)
RETIC CT PCT: 1.9 % (ref 0.7–2.1)

## 2017-06-29 LAB — CEA (IN HOUSE-CHCC): CEA (CHCC-IN HOUSE): 1.97 ng/mL (ref 0.00–5.00)

## 2017-06-29 MED ORDER — HEPARIN SOD (PORK) LOCK FLUSH 100 UNIT/ML IV SOLN
500.0000 [IU] | Freq: Once | INTRAVENOUS | Status: DC | PRN
  Filled 2017-06-29: qty 5

## 2017-06-29 MED ORDER — SODIUM CHLORIDE 0.9% FLUSH
10.0000 mL | Freq: Once | INTRAVENOUS | Status: AC
  Administered 2017-06-29: 10 mL
  Filled 2017-06-29: qty 10

## 2017-06-29 MED ORDER — DIPHENOXYLATE-ATROPINE 2.5-0.025 MG PO TABS: ORAL_TABLET | ORAL | 1 refills | Status: DC

## 2017-06-29 NOTE — Progress Notes (Signed)
[place]  [phone]) [phone] [phone]  Clinic Follow Up Note   Patient Care Team: Lucille Passy, MD as PCP - General (Family Medicine) Irene Shipper, MD as Consulting Physician (Gastroenterology) Leighton Ruff, MD as Consulting Physician (General Surgery) Truitt Merle, MD as Consulting Physician (Hematology) Kyung Rudd, MD as Consulting Physician (Radiation Oncology)   Date of Service:  06/29/2017  CHIEF COMPLAINTS:  Follow up rectal Adenocarcinoma  Oncology History   Cancer Staging Rectal adenocarcinoma [facility]) Staging form: Colon and Rectum, AJCC 8th Edition - Clinical stage from 07/09/2016: Stage IIA (cT3, cN0, cM0) - Signed by Truitt Merle, MD on 08/16/2016       Rectal adenocarcinoma ([place])   07/09/2016 Pathology Results    Diagnosis Surgical [P], rectum mass - INVASIVE ADENOCARCINOMA. - SEE COMMENT.      07/09/2016 Procedure    Colonoscopy A non-obstructing large friable mass was found in the distal rectum. This began at approximately 1 cm above the anal verge and extended proximal for a distance of 8-10 cm. The lesion was bulky and involved approximately 75% of the luminal circumference. Multiple biopsies were taken. A few small-mouthed diverticula were found in the left colon. The exam was otherwise without abnormality on direct views. Retroflexion not performed purposely.      07/12/2016 Imaging    CT C/A/P with contrast IMPRESSION: Rectal wall thickening/mass, compatible with known rectal adenocarcinoma. No findings specific for metastatic disease in the chest, abdomen, or pelvis. 2.2 cm probable hemangioma in segment 4B, although poorly evaluated. Consider MRI abdomen with/ without multihance contrast for definitive characterization, as clinically warranted. Bilateral adrenal nodules measuring up to 1.6 cm on the right, likely reflecting benign adrenal adenomas, although technically indeterminate. If MRI abdomen is performed, these can be  definitively characterized at that time.      07/20/2016 Initial Diagnosis    Rectal adenocarcinoma ([place])      08/03/2016 Imaging    MRI AP W WO Contrast 08/03/16 IMPRESSION: 1. Large rectal mass measures 6.1 cm in length. No obstruction identified. This is compatible with at least a T3bN0M0 lesion. 2. No specific features highly suspicious for nodal metastasis or metastatic disease to the upper abdomen. 3. Indeterminate arterial phase enhancing lesion within the anterior dome of liver measures less than 1 cm. This may represent a benign liver lesions such as FNH or adenoma. Less favored with the hypervascular liver metastasis. Followup imaging at 6 months with repeat MRI of the liver is advise. 4. Bilateral adrenal adenomas      08/04/2016 - 09/16/2016 Radiation Therapy    Concurrent chemo radiation with Dr. [name]      08/04/2016 - 09/16/2016 Chemotherapy    Concurrent chemo radiation with Xeloda, '1500mg'$  twice daily      12/06/2016 Surgery    ULTRA LOW ANTERIOR RESECTION OF SIGMOID COLON AND RECTUM WITH COLOANAL ANASTAMOSIS AND LOOP ILEOSTOMY  by Dr. Drue Flirt at [place]  12/06/16      12/06/2016 Pathology Results    FINAL PATHOLOGIC DIAGNOSIS 12/06/16 MICROSCOPIC EXAMINATION AND DIAGNOSIS  A.ANAL CANAL, MUCOSECTOMY: Benign rectal and fibroadipose tissue. Negative for malignancy.  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 Mucinous tumor focally invades through the muscularis propria into pericolorectal tissue. Radial margin interpreted as involved by invasive carcinoma (invasive acellular mucin withrare viable tumor cells focally comes to within 0.2 mm [0.02 cm] of the inked radial soft tissue margin).  Treatment effect present, predominantly invasive acellular mucin  associated with only very rare small groups of viable tumor cells (near complete response). 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 AJCC Pathologic Stage: ypT3 pN1a. See Cancer Case Summary.  C.POSTERIOR ANAL CANAL, EXCISION: Benign rectal tissue. Negative for malignancy.      01/26/2017 - 05/04/2017 Chemotherapy    Adjuvant FOLFOX q2 weeks, for 8 cycles            04/04/2017 Imaging    CT CAP IMPRESSION: New complete interval resolution of bulky rectal soft tissue mass since prior exam.  No evidence of local or distant metastatic disease.      06/13/2017 Surgery    CLOSURE OF LOOP ILEOSTOMY Dannielle Burn, MD [facility] 06/13/2017       HISTORY OF PRESENTING ILLNESS: 07/20/16 [name] 67 y.o. female is here because of recently discovered invasive adenocarcinoma. She is accompanied by a friend today. The patient underwent colonoscopy on 07/09/16 with Dr. [name]. According to Dr. Blanch Media note from that encounter, the patient presented with complaints of rectal bleeding and iron deficiency anemia. She reports she was told she has hemorrhoids and initially was not concerned about the rectal bleeding. Her most recent prior colonoscopy was 2005. On exam, Dr. Henrene Pastor noted a non-obstructing large friable mass was found in the distal rectum. This began at approximately 1 cm above the anal verge and extended proximal for a distance of 8-10 cm. The lesion was bulky and involved approximately 75% of the luminal circumference. Biopsy of the rectal mass on 07/09/16 showed invasive adenocarcinoma.  CT C/A/P on 07/12/16 revealed rectal wall thickening/mass, compatible with known rectal adenocarcinoma.No findings specific for metastatic disease in the chest, abdomen,  or pelvis. 2.2 cm probable hemangioma in segment 4B, although poorly evaluated. Consider MRI abdomen with/ without multihance contrast for definitive characterization, as clinically warranted. Bilateral adrenal nodules measuring up to 1.6 cm on the right, likely reflecting benign adrenal adenomas, although technically indeterminate. If MRI abdomen is performed, these can be definitively characterized at that time.  On 07/19/16 the patient consulted with Dr. Leighton Ruff of [facility]. Per her note, she recommends complete metastatic workup with MRI to evaluate liver, adrenal gland, and rectum. The patient will follow up with Dr. Marcello Moores following neoadjuvant therapy to discuss surgical resection.  The patient reports she has lost 30 lbs since January. She reports ongoing fatigue, which her PCP related to iron deficiency and vitamin D deficiency. She reports occasional incontinence, and irregular bowel activity. She reports ongoing bright red rectal bleeding. She denies cramps or pain. She takes 2 iron pills daily.  CURRENT THERAPY: Surveillance   INTERVAL HISTORY:  Billye Pickerel is a 67 y.o. female who is here for a follow-up after her recent surgery. She presents to the clinic today by herself.  She underwent ileostomy takedown at [facility] on June 13, 2017.  Her surgery went well, and she is recovering well. Her main complaint today is diarrhea and stool incontinence, that is very bad and is affecting her sleep. She's using imodium for her diarrhea.  Her weight is stable.    MEDICAL HISTORY:  Past Medical History:  Diagnosis Date  . Coronary artery disease 07/2003  . Myocardial infarction ([place])   . Obesity 2003   s/p gastric bypass     SURGICAL HISTORY:  Past Surgical History:  Procedure Laterality Date  . CHOLECYSTECTOMY  1999  . GASTRIC BYPASS  2003  . IR FLUORO GUIDE PORT INSERTION RIGHT  01/25/2017  . IR US GUIDE VASC ACCESS RIGHT  01/25/2017    SOCIAL  HISTORY: Social History   Socioeconomic History  . Marital status: Single    Spouse name: Not on file  . Number of children: 0  . Years of education: Not on file  . Highest education level: Not on file  Occupational History  . Occupation: retired  Scientific laboratory technician  . Financial resource strain: Not on file  . Food insecurity:    Worry: Not on file    Inability: Not on file  . Transportation needs:    Medical: Not on file    Non-medical: Not on file  Tobacco Use  . Smoking status: Never Smoker  . Smokeless tobacco: Never Used  Substance and Sexual Activity  . Alcohol use: No  . Drug use: No  . Sexual activity: Not on file  Lifestyle  . Physical activity:    Days per week: Not on file    Minutes per session: Not on file  . Stress: Not on file  Relationships  . Social connections:    Talks on phone: Not on file    Gets together: Not on file    Attends religious service: Not on file    Active member of club or organization: Not on file    Attends meetings of clubs or organizations: Not on file    Relationship status: Not on file  . Intimate partner violence:    Fear of current or ex partner: Not on file    Emotionally abused: Not on file    Physically abused: Not on file    Forced sexual activity: Not on file  Other Topics Concern  . Not on file  Social History Narrative  . Not on file    FAMILY HISTORY: Family History  Problem Relation Age of Onset  . Heart attack Mother        d.93  . Throat cancer Mother 78  . Clotting disorder Mother   . Liver disease Mother   . Kidney disease Mother   . Heart attack Father        d.92  . Prostate cancer Father 62  . Uterine cancer Sister 57       treated with total hysterectomy and radiation  . Thyroid cancer Sister 66       treated with thyroidectomy  . Cancer Maternal Uncle        d.80s unspecified type of cancer. History of smoking.  Marland Kitchen Uterine cancer Sister 48  . Colon cancer Cousin 74       paternal first-cousin     ALLERGIES:  is allergic to bactrim [sulfamethoxazole-trimethoprim]; phenergan [promethazine hcl]; sulfa antibiotics; and penicillins.  MEDICATIONS:  Current Outpatient Medications  Medication Sig Dispense Refill  . aspirin 81 MG chewable tablet Chew 81 mg by mouth daily.    . Cholecalciferol (VITAMIN D3) 2000 units capsule Take 1 capsule by mouth daily.    . ferrous sulfate (FERROUSUL) 325 (65 FE) MG tablet Take 1 tablet (325 mg total) by mouth daily with breakfast. (Patient taking differently: Take 325 mg by mouth. Take two by mouth daily) 30 tablet 6  . ibuprofen (ADVIL,MOTRIN) 200 MG tablet Take 200 mg by mouth every 6 (six) hours as needed.    . Multiple Vitamin (MULTIVITAMIN) tablet Take 1 tablet by mouth  daily.    . diphenoxylate-atropine (LOMOTIL) 2.5-0.025 MG tablet TAKE 1 TO 2 TABLETS BY MOUTH FOUR TIMES DAILY AS NEEDED 120 tablet 1  . LORazepam (ATIVAN) 0.5 MG tablet Take 1 tablet (0.5 mg total) by mouth every 8 (eight) hours as needed (nausea and vomiting). (Patient not taking: Reported on 06/29/2017) 15 tablet 0  . mirtazapine (REMERON) 15 MG tablet TAKE 1 TABLET(15 MG) BY MOUTH AT BEDTIME (Patient not taking: Reported on 06/29/2017) 30 tablet 0  . ondansetron (ZOFRAN) 8 MG tablet Take 1 tablet (8 mg total) by mouth every 8 (eight) hours as needed for nausea or vomiting. (Patient not taking: Reported on 06/29/2017) 20 tablet 1  . prochlorperazine (COMPAZINE) 10 MG tablet Take 1 tablet (10 mg total) by mouth every 6 (six) hours as needed for nausea or vomiting. (Patient not taking: Reported on 06/29/2017) 30 tablet 1  . traMADol (ULTRAM) 50 MG tablet Take 1 tablet (50 mg total) by mouth every 6 (six) hours as needed. (Patient not taking: Reported on 04/20/2017) 90 tablet 0  . urea (CARMOL) 10 % cream Apply topically as needed. (Patient not taking: Reported on 06/29/2017) 71 g 1   Current Facility-Administered Medications  Medication Dose Route Frequency Provider Last Rate Last Dose  .  0.9 %  sodium chloride infusion  500 mL Intravenous Continuous Irene Shipper, MD       Facility-Administered Medications Ordered in Other Visits  Medication Dose Route Frequency Provider Last Rate Last Dose  . Tbo-Filgrastim (GRANIX) injection 480 mcg  480 mcg Subcutaneous Once Truitt Merle, MD        REVIEW OF SYSTEMS:  Constitutional: Denies fevers, chills or abnormal night sweats, steady weight  Eyes: Denies blurriness of vision, double vision or watery eyes Ears, nose, mouth, throat, and face: Denies mucositis or sore throat Respiratory: Denies cough, dyspnea or wheezes Cardiovascular: Denies palpitation, chest discomfort or lower extremity swelling Gastrointestinal:  Denies nausea, heartburn (+) diarrhea (+) ileostomy bag Skin: Denies abnormal skin rashes  Lymphatics: Denies new lymphadenopathy or easy bruising Neurological:Denies numbness, tingling or new weaknesses  Behavioral/Psych: no new changes  All other systems were reviewed with the patient and are negative.  PHYSICAL EXAMINATION:  ECOG PERFORMANCE STATUS: 1 - Symptomatic but completely ambulatory Vitals:   06/29/17 1157  BP: (!) 147/74  Pulse: 73  Resp: 18  Temp: 98.7 F (37.1 C)  TempSrc: Oral  SpO2: 100%  Weight: 128 lb 14.4 oz (58.5 kg)  Height: 5' (1.524 m)   GENERAL:alert, no distress and comfortable SKIN: skin color, texture, turgor are normal, no rashes or significant lesions, Mild erythema parienal area, no skin breakdown or discharge  EYES: normal, conjunctiva are pink and non-injected, sclera clear OROPHARYNX:no exudate, no erythema and lips, buccal mucosa, and tongue normal NECK: supple, thyroid normal size, non-tender, without nodularity LYMPH:  no palpable lymphadenopathy in the cervical, axillary or inguinal LUNGS: clear to auscultation and percussion with normal breathing effort HEART: regular rate & rhythm and no murmurs and no lower extremity edema ABDOMEN:abdomen soft, non-tender, and normal  bowel sounds. No organomegaly. Low abdomen (+) surgical incision healing well (+) ileostomy bag with watery stool  Musculoskeletal:no cyanosis of digits and no clubbing PSYCH: alert & oriented x 3 with fluent speech NEURO: no focal motor/sensory deficits Rectal Exam: Deferred today   LABORATORY DATA:  I have reviewed the data as listed. CBC Latest Ref Rng & Units 06/29/2017 05/04/2017 04/20/2017  WBC 3.9 - 10.3 K/uL 9.2 4.2 3.5(L)  Hemoglobin 11.6 -  15.9 g/dL 9.9(L) 9.7(L) 9.8(L)  Hematocrit 34.8 - 46.6 % 30.5(L) 29.7(L) 30.6(L)  Platelets 145 - 400 K/uL 258 106(L) 122(L)   CMP Latest Ref Rng & Units 06/29/2017 05/04/2017 04/20/2017  Glucose 70 - 99 mg/dL 94 100 79  BUN 8 - 23 mg/dL '17 16 11  '$ Creatinine 0.44 - 1.00 mg/dL 0.59 0.62 0.69  Sodium 135 - 145 mmol/L 140 139 138  Potassium 3.5 - 5.1 mmol/L 3.9 4.0 4.6  Chloride 98 - 111 mmol/L 110 111(H) 109  CO2 22 - 32 mmol/L 24 21(L) 25  Calcium 8.9 - 10.3 mg/dL 9.2 8.9 9.2  Total Protein 6.5 - 8.1 g/dL 6.1(L) 5.9(L) 6.0(L)  Total Bilirubin 0.3 - 1.2 mg/dL 0.2(L) 0.3 <0.2(L)  Alkaline Phos 38 - 126 U/L 122 113 101  AST 15 - 41 U/L 29 41(H) 44(H)  ALT 0 - 44 U/L 25 33 40    PATHOLOGY:  FINAL PATHOLOGIC DIAGNOSIS 12/06/16 MICROSCOPIC EXAMINATION AND DIAGNOSIS  A.ANAL CANAL, MUCOSECTOMY: Benign rectal and fibroadipose tissue. Negative for malignancy.  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 Mucinous tumor focally invades through the muscularis propria into pericolorectal tissue. Radial margin interpreted as involved by invasive carcinoma (invasive acellular mucin with rare viable tumor cells focally comes to within 0.2 mm [0.02 cm] of the inked radial soft tissue margin). Treatment effect present, predominantly  invasive acellular mucin associated with only very rare small groups of viable tumor cells (near complete response). 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 AJCC Pathologic Stage: ypT3 pN1a. See Cancer Case Summary.  C.POSTERIOR ANAL CANAL, EXCISION: Benign rectal tissue. Negative for malignancy.    07/09/2016: Diagnosis Surgical [P], rectum mass - INVASIVE ADENOCARCINOMA.  RADIOGRAPHIC STUDIES: I have personally reviewed the radiological images as listed and agreed with the findings in the report.  CT CAP W Contrast 04/04/17 IMPRESSION: New complete interval resolution of bulky rectal soft tissue mass since prior exam. No evidence of local or distant metastatic disease.  No results found.  ASSESSMENT & PLAN:  Tonya [name] is a 67 y.o. woman with past medical history of gastric bypass surgery, presented with rectal bleeding, loose bowel movement and stool incontinence for one half years, was recently diagnosed invasive rectal adenocarcinoma.  1. Invasive low rectal adenocarcinoma, cT3N0M0, ypT3N1a, near complete response, (+) margin, MMR normal  -I reviewed his CT scan findings, and surgical pathology results in great details with patient and her friend.  -I discussed her abdominal and pelvic MRI scan findings, which showed a T3 N0 rectal tumor. The liver lesion is indeterminate, low possibility of metastasis. We'll continue observation, and repeat scan in 3-6 months. -Patient agrees to proceed with concurrent chemotherapy and radiation. She completed on 09/16/16. Patient has tolerated concurrent chemotherapy and radiation very well., other than rectal pain.  -She underwent surgery on 12/06/16 with Dr. Drue Flirt at [facility]. The surgical path showed near complete response to  neoadjuvant chemoradiation, but it was still a T3 lesion, and unfortunately the radial margin was positive and one out of 12 nodes was positive. She is stage III and moderate risk of distant recurrence and high risk of local recurrence   -I recommend adjuvant chemotherapy with CAPOX every 3 weeks or FOLFOX every 2 weeks for at least 2-3 months. I previously discussed the side effects in  great detail. I gave reading material on these medications. Although I think she may tolerate FOLFOX better, she is very stressed about the pump, and see frequent visit for this treatment regimen.  After lengthy discussion, we decided to try Xeloda and oxaliplatin first.  I plan to reduce her oxaliplatin dose for the first cycle to see if she is able to tolerate. Side effects were discussed with patient in great detail. She agrees to proceed.  -Pt previously appeared to be very depressed and devastated by her slow recovery, colostomy bag etc, and has little interest in her life now. Her depression has improved since she started adjuvant chemo  -She has been tolerating chemotherapy overall well, main side effect of fatigue, hand cracking, cold sensitivity, no worsening of her diarrhea, no significant nausea or other issues. -restaging CT from 04/04/17 was reviewed with her, which shows complete interval resolution of bulky rectal soft tissue mass and no evidence of local or distant metastatic disease. -She completed 4 months adjuvant chemotherapy FOLFOX on 05/04/2017 -She underwent ileostomy reversal on June 13, 2017, she has severe diarrhea and stool incontinence since the surgery. -I will refer her to pelvic floor physical therapy, I also called the Imodium for her. -Continue colon cancer surveillance, will see her back in 2 months, next surveillance CT scan in October 2019.  2. Iron Deficient Anemia  - The patient takes 2 iron supplements daily. - Her iron has been somewhat low. - She will stop taking aspirin to reduce her  bleeding risk. -Repeat iron studies showed iron deficiency, she received 1 dose IV iron Feraheme in the past -Her levels are improved now, Ferritin is 272 as of 08/16/2016  -Hgb is 10.6 after 4 cycles of chemo -Repeat iron studies normal previously  3. Genetics  -Patient has 2 sisters who had uterine cancer, this is suspicious for lynch syndrome. -I encouraged the patient to pursue genetic testing based on her family history. This will determine if the patient has inheritable genetic syndrome, such as Lynch syndrome -The patient was seen by genetic counselor on July 29, 2016, but declined genetic testing.   4. History of gastric bypass surgery in 2003 -for obesity   5. History of DM and HTN -Resolved after her gastric bypass surgery. -We'll monitor her blood pressure and glucose closely during her chemotherapy treatment. -Her glucose is 76  (03/23/17) and her BP is 127/79  6. Diarrhea and stool incontinence -She has had a severe diarrhea and stool incontinence since her ileostomy reversal surgery -I advised her to try lomotil up to 6-8 times a day as needed. I refilled for her today  -I suggested her to use imodium in conjunction with lomotil to help with diarrhea. She can take OTC medication for gas  7. Depression -I previously discussed as this is a hard time for her post surgery she can talk to someone at our clinic. I recommend her to see our Education officer, museum for counseling, she agreed.  -I prescribed Mirtazapine on (12/22/16) as this can also help her appetite and sleep, she can start at half dose to see if she can tolerate.  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 No functional or sensory deficit. Will monitor closely. -overall improved     PLAN: - PT referral for pelvic floor exercise to improve her stool  incontinence - Lab and f/u  in 2 months   Orders Placed This Encounter  Procedures  . Ambulatory referral to Physical Therapy    Referral Priority:   Urgent    Referral Type:   Physical Medicine    Referral Reason:   Specialty Services Required    Requested Specialty:   Physical Therapy    Number of Visits Requested:   1   All questions were answered. The patient knows to call the clinic with any problems, questions or concerns.  I spent 20 minutes counseling the patient face to face. The total time spent in the appointment was 25 minutes and more than 50% was on counseling.     Truitt Merle, MD 06/29/2017 7:39 PM

## 2017-06-29 NOTE — Telephone Encounter (Signed)
Appointments scheduled AVS/Calendar printed per 6/26 los °

## 2017-06-29 NOTE — Telephone Encounter (Addendum)
Pt voiced understanding of this note.   ----- Message from Alla Feeling, NP sent at 06/29/2017  2:00 PM EDT ----- Luiz Iron,  Please let her know CEA tumor marker is normal. Thanks, Regan Rakers NP

## 2017-07-01 ENCOUNTER — Encounter: Payer: Self-pay | Admitting: Physical Therapy

## 2017-07-01 ENCOUNTER — Other Ambulatory Visit: Payer: Self-pay

## 2017-07-01 ENCOUNTER — Ambulatory Visit: Payer: Medicare HMO | Attending: Hematology | Admitting: Physical Therapy

## 2017-07-01 DIAGNOSIS — R279 Unspecified lack of coordination: Secondary | ICD-10-CM | POA: Insufficient documentation

## 2017-07-01 DIAGNOSIS — M62838 Other muscle spasm: Secondary | ICD-10-CM

## 2017-07-01 DIAGNOSIS — M6281 Muscle weakness (generalized): Secondary | ICD-10-CM

## 2017-07-01 NOTE — Patient Instructions (Signed)
Types of Fiber  There are two main types of fiber:  insoluble and soluble.  Both of these types can prevent and relieve constipation and diarrhea, although some people find one or the other to be more easily digested.  This handout details information about both types of fiber.  Insoluble Fiber       Functions of Insoluble Fiber . moves bulk through the intestines  . controls and balances the pH (acidity) in the intestines       Benefits of Insoluble Fiber . promotes regular bowel movement and prevents constipation  . removes fecal waste through colon in less time  . keeps an optimal pH in intestines to prevent microbes from producing cancer substances, therefore preventing colon cancer        Food Sources of Insoluble Fiber . whole-wheat products  . wheat bran "miller's bran" . corn bran  . flax seed or other seeds . vegetables such as green beans, broccoli, cauliflower and potato skins  . fruit skins and root vegetable skins  . popcorn . brown rice  #### Soluble Fiber ####       Functions of Soluble Fiber  . holds water in the colon to bulk and soften the stool . prolongs stomach emptying time so that sugar is released and absorbed more slowly        Benefits of Soluble Fiber . lowers total cholesterol and LDL cholesterol (the bad cholesterol) therefore reducing the risk of heart disease  . regulates blood sugar for people with diabetes       Food Sources of Soluble Fiber . oat/oat bran . dried beans and peas  . nuts  . barley  . flax seed or other seeds . fruits such as oranges, pears, peaches, and apples  . vegetables such as carrots  . psyllium husk  . prunes    YouTube - [employer] - pelvic floor relaxation meditation  Access Code: [email or web address]  URL: [email or web address]  Date: 07/01/2017  Prepared by: Lovett Calender   Exercises  Supine Diaphragmatic Breathing - 10 reps - 1 sets - 3x daily - 7x weekly  Seated Piriformis Stretch  with Trunk Bend - 3 reps - 1 sets - 30 sec hold - 1x daily - 7x weekly  Supine Single Knee to Chest Stretch - 5 reps - 1 sets - 10 sec hold - 2x daily - 7x weekly    Toileting Techniques for Bowel Movements (Defecation) Using your belly (abdomen) and pelvic floor muscles to have a bowel movement is usually instinctive.  Sometimes people can have problems with these muscles and have to relearn proper defecation (emptying) techniques.  If you have weakness in your muscles, organs that are falling out, decreased sensation in your pelvis, or ignore your urge to go, you may find yourself straining to have a bowel movement.  You are straining if you are: . holding your breath or taking in a huge gulp of air and holding it  . keeping your lips and jaw tensed and closed tightly . turning red in the face because of excessive pushing or forcing . developing or worsening your  hemorrhoids . getting faint while pushing . not emptying completely and have to defecate many times a day  If you are straining, you are actually making it harder for yourself to have a bowel movement.  Many people find they are pulling up with the pelvic floor muscles and closing off instead of opening the anus. Due to lack pelvic floor relaxation  and coordination the abdominal muscles, one has to work harder to push the feces out.  Many people have never been taught how to defecate efficiently and effectively.  Notice what happens to your body when you are having a bowel movement.  While you are sitting on the toilet pay attention to the following areas: . Jaw and mouth position . Angle of your hips   . Whether your feet touch the ground or not . Arm placement  . Spine position . Waist . Belly tension . Anus (opening of the anal canal)  An Evacuation/Defecation Plan   Here are the 4 basic points:  1. Lean forward enough for your elbows to rest on your knees 2. Support your feet on the floor or use a low stool if your feet  don't touch the floor  3. Push out your belly as if you have swallowed a beach ball-you should feel a widening of your waist 4. Open and relax your pelvic floor muscles, rather than tightening around the anus      The following conditions my require modifications to your toileting posture:  . If you have had surgery in the past that limits your back, hip, pelvic, knee or ankle flexibility . Constipation   Your healthcare practitioner may make the following additional suggestions and adjustments:  1) Sit on the toilet  a) Make sure your feet are supported. b) Notice your hip angle and spine position-most people find it effective to lean forward or raise their knees, which can help the muscles around the anus to relax  c) When you lean forward, place your forearms on your thighs for support  2) Relax suggestions a) Breath deeply in through your nose and out slowly through your mouth as if you are smelling the flowers and blowing out the candles. b) To become aware of how to relax your muscles, contracting and releasing muscles can be helpful.  Pull your pelvic floor muscles in tightly by using the image of holding back gas, or closing around the anus (visualize making a circle smaller) and lifting the anus up and in.  Then release the muscles and your anus should drop down and feel open. Repeat 5 times ending with the feeling of relaxation. c) Keep your pelvic floor muscles relaxed; let your belly bulge out. d) The digestive tract starts at the mouth and ends at the anal opening, so be sure to relax both ends of the tube.  Place your tongue on the roof of your mouth with your teeth separated.  This helps relax your mouth and will help to relax the anus at the same time.  3) Empty (defecation) a) Keep your pelvic floor and sphincter relaxed, then bulge your anal muscles.  Make the anal opening wide.  b) Stick your belly out as if you have swallowed a beach ball. c) Make your belly wall hard  using your belly muscles while continuing to breathe. Doing this makes it easier to open your anus. d) Breath out and give a grunt (or try using other sounds such as ahhhh, shhhhh, ohhhh or grrrrrrr).  4) Finish a) As you finish your bowel movement, pull the pelvic floor muscles up and in.  This will leave your anus in the proper place rather than remaining pushed out and down. If you leave your anus pushed out and down, it will start to feel as though that is normal and give you incorrect signals about needing to have a bowel movement.  [facility] [address] [place], [place] [address]

## 2017-07-01 NOTE — Therapy (Signed)
[facility] [address] [place], [place], [address] Phone: [phone]   Fax:  [phone]  Physical Therapy Evaluation  Patient Details  Name: Tonya [name] MRN: [id number] Date of Birth: May 07, 1950 Referring Provider: Truitt Merle, MD   Encounter Date: 07/01/2017  PT End of Session - 07/01/17 0856    Visit Number  1    Date for PT Re-Evaluation  08/26/17    PT Start Time  0858    PT Stop Time  0953    PT Time Calculation (min)  55 min    Activity Tolerance  Patient tolerated treatment well    Behavior During Therapy  [facility] for tasks assessed/performed       Past Medical History:  Diagnosis Date  . Coronary artery disease 07/2003  . Myocardial infarction ([place])   . Obesity 2003   s/p gastric bypass     Past Surgical History:  Procedure Laterality Date  . CHOLECYSTECTOMY  1999  . GASTRIC BYPASS  2003  . IR FLUORO GUIDE PORT INSERTION RIGHT  01/25/2017  . IR US GUIDE VASC ACCESS RIGHT  01/25/2017    There were no vitals filed for this visit.   Subjective Assessment - 07/01/17 0902    Subjective  Pt states she is having no feeling.  Stool is toothpaste consistency and going at least every 30 minutes. Strong urge.      Patient Stated Goals  Stop going to the bathroom as much    Currently in Pain?  No/denies         [facility] PT Assessment - 07/01/17 0001      Assessment   Medical Diagnosis  C20 (ICD-10-CM) - Rectal adenocarcinoma [facility])    Referring Provider  Truitt Merle, MD    Onset Date/Surgical Date  06/13/17 ileostomy reversal    Prior Therapy  No      Precautions   Precautions  None      Restrictions   Weight Bearing Restrictions  No      Balance Screen   Has the patient fallen in the past 6 months  No      [place] residence    Living Arrangements  Non-relatives/Friends      Prior Function   Level of Independence  Independent    Vocation  Retired       Associate Professor   Overall Cognitive Status  Within Functional Limits for tasks assessed      Posture/Postural Control   Posture/Postural Control  Postural limitations    Postural Limitations  Rounded Shoulders;Increased thoracic kyphosis      ROM / Strength   AROM / PROM / Strength  PROM;Strength      PROM   Overall PROM Comments  hip ER 75%; IR 50% Rt; 60% Lt      Strength   Overall Strength Comments  bilateral hip abduction and adduction 4/5 MMT      Flexibility   Soft Tissue Assessment /Muscle Length  yes    Hamstrings  80%      Ambulation/Gait   Gait Pattern  Decreased step length - right;Decreased step length - left                Objective measurements completed on examination: See above findings.    Pelvic Floor Special Questions - 07/01/17 0001    Prior Pelvic/Prostate Exam  Yes    Are you Pregnant or attempting pregnancy?  No  Currently Sexually Active  No    Urinary Leakage  No    Fecal incontinence  -- 2x/hour or more    Falling out feeling (prolapse)  No    Skin Integrity  Intact;Erthema;Irritaion present at    Skin Integrity Irritation Present at  gluteal fold redness of > 1" diameter; anal sphincter bleeding and raw skin tissue    Pelvic Floor Internal Exam  internal assessment performed with patient informed consent    Exam Type  Rectal    Sensation  normal around anal sphincter    Palpation  extremely tender    Strength  weak squeeze, no lift    Strength # of reps  1    Tone  high               PT Education - 07/01/17 1100    Education Details  Access Code: [email or web address], type of fiber, pelvic relaxation, stretches, breathing    Person(s) Educated  Patient    Methods  Explanation;Demonstration;Handout    Comprehension  Verbalized understanding;Returned demonstration       PT Short Term Goals - 07/01/17 1123      PT SHORT TERM GOAL #1   Title  ind with using dilators    Time  4    Period  Weeks    Status  New    Target Date   07/29/17      PT SHORT TERM GOAL #2   Title  reduced BM to 1x every 2 hours    Time  4    Period  Weeks    Status  New    Target Date  07/29/17        PT Long Term Goals - 07/01/17 1124      PT LONG TERM GOAL #1   Title  able to tolerate nickel size diameter dilator for improved bowel movement    Baseline  q-tip    Time  8    Period  Weeks    Status  New    Target Date  08/26/17      PT LONG TERM GOAL #2   Title  able to sleep for 2 hour stretch due to reduction in waking to use the bathroom    Time  8    Period  Weeks    Status  New    Target Date  08/26/17      PT LONG TERM GOAL #3   Title  able to decrease use of depends to 5/day    Baseline  current 10/day    Time  8    Period  Weeks    Status  New    Target Date  08/26/17      PT LONG TERM GOAL #4   Title  ind with advanced HEP    Time  8    Period  Weeks    Status  New    Target Date  08/26/17             Plan - 07/01/17 1102    Clinical Impression Statement  Pt presents to clinic today due to fecal incontinence that she has been having since ileostomy reversal.  Pt reports having to have BM every 30 minutes at least.  Pt has LE weakness and decreaesd ROM bialterally.  Pt has posture and gait abnormalities as mentioned above.  Pt  has very irritated skin in gluteal fold around anus and skin abrasions around anal external anal sphincter.  Pt has  very tight and restricted anal sphincter muscles.  PT unable to use fifth digit without extreme pain.  Pt able to tolerate Q-tip with some discomfort.  Pt demonstrates weak contraction of anal sphincter.  She will benefit from skilled PT to address impairments for return to maximum function.    History and Personal Factors relevant to plan of care:  rectal cancer treatment, ileostomy reversal 06/13/17    Clinical Presentation  Stable    Clinical Presentation due to:  pt is stable    Clinical Decision Making  Low    Rehab Potential  Good    PT Frequency  2x / week     PT Duration  8 weeks    PT Treatment/Interventions  ADLs/Self Care Home Management;Biofeedback;Cryotherapy;Electrical Stimulation;Moist Heat;Therapeutic activities;Therapeutic exercise;Neuromuscular re-education;Patient/family education;Passive range of motion;Taping;Dry needling;Manual techniques    PT Next Visit Plan  review dilator use and give handout, abdominal massage, cryotherapy for skin irritation, controlling the urge, relaxation and breathing    PT Home Exercise Plan  Access Code: [email or web address]     Recommended Other Services  eval 07/01/17    Consulted and Agree with Plan of Care  Patient       Patient will benefit from skilled therapeutic intervention in order to improve the following deficits and impairments:  Pain, Impaired tone, Increased muscle spasms, Decreased strength, Decreased range of motion  Visit Diagnosis: Muscle weakness (generalized)  Unspecified lack of coordination  Other muscle spasm     Problem List Patient Active Problem List   Diagnosis Date Noted  . Port-A-Cath in place 04/20/2017  . Diarrhea 01/05/2017  . Rectal adenocarcinoma ([place]) 07/20/2016  . Iron deficiency anemia 07/20/2016  . Hemorrhoids 06/03/2016  . History of anemia 06/18/2015  . Fatigue 04/17/2014  . Vitamin D deficiency 11/15/2008  . CORONARY ARTERY DISEASE 11/15/2008  . DIABETES MELLITUS, HX OF 11/15/2008  . HYPERTENSION, HX OF 11/15/2008    Zannie Cove, PT 07/01/2017, 11:57 AM  [facility] [address], [place], [place], [address] Phone: [phone]   Fax:  [phone]  Name: Tonya [name] MRN: [id number] Date of Birth: Sep 05, 1950

## 2017-07-05 ENCOUNTER — Ambulatory Visit: Payer: Medicare HMO | Attending: Hematology | Admitting: Physical Therapy

## 2017-07-05 ENCOUNTER — Encounter: Payer: Self-pay | Admitting: Physical Therapy

## 2017-07-05 DIAGNOSIS — M6281 Muscle weakness (generalized): Secondary | ICD-10-CM | POA: Diagnosis not present

## 2017-07-05 DIAGNOSIS — M62838 Other muscle spasm: Secondary | ICD-10-CM | POA: Diagnosis not present

## 2017-07-05 DIAGNOSIS — R279 Unspecified lack of coordination: Secondary | ICD-10-CM | POA: Diagnosis not present

## 2017-07-05 NOTE — Therapy (Signed)
[facility] [address] [place], [place], [address] Phone: [phone]   Fax:  [phone]  Physical Therapy Treatment  Patient Details  Name: Tonya [name] MRN: [id number] Date of Birth: [birth year] Referring Provider: Truitt Merle, MD   Encounter Date: 07/05/2017  PT End of Session - 07/05/17 1015    Visit Number  2    Date for PT Re-Evaluation  08/26/17    PT Start Time  2297    PT Stop Time  1058    PT Time Calculation (min)  43 min    Activity Tolerance  Patient tolerated treatment well    Behavior During Therapy  [facility] for tasks assessed/performed       Past Medical History:  Diagnosis Date  . Coronary artery disease 07/2003  . Myocardial infarction ([place])   . Obesity 2003   s/p gastric bypass     Past Surgical History:  Procedure Laterality Date  . CHOLECYSTECTOMY  1999  . GASTRIC BYPASS  2003  . IR FLUORO GUIDE PORT INSERTION RIGHT  01/25/2017  . IR US GUIDE VASC ACCESS RIGHT  01/25/2017    There were no vitals filed for this visit.  Subjective Assessment - 07/05/17 1408    Subjective  Pt is discouraged upon coming to clinic today as nothing has changed yet.  She states on a couple of occasions during the treatment that she wishes she had just enjoyed her last year and had not gone through cancer treatment.  When asked if she would be willing to talk to someone she was not open to the idea.    Currently in Pain?  No/denies                       [facility] Adult PT Treatment/Exercise - 07/05/17 0001      Self-Care   Self-Care  Other Self-Care Comments    Other Self-Care Comments   urge to void, review of dilator usage      Neuro Re-ed    Neuro Re-ed Details   biofeedback used during treatment, quick contract and relax, contract and hold 3 sec contract, 3 sec relax      Exercises   Exercises  Lumbar      Lumbar Exercises: Supine   Clam  15 reps    Clam Limitations  red band    Other Supine Lumbar Exercises  ball squeeze with pelvic contraction - 3 second hold, 3 second relax       biofeedback max 19mV, average during exercises 10-15 mV      PT Education - 07/05/17 1110    Education Details   Access Code: [email or web address]     Person(s) Educated  Patient    Methods  Explanation;Demonstration;Handout;Verbal cues;Tactile cues    Comprehension  Verbalized understanding;Returned demonstration       PT Short Term Goals - 07/01/17 1123      PT SHORT TERM GOAL #1   Title  ind with using dilators    Time  4    Period  Weeks    Status  New    Target Date  07/29/17      PT SHORT TERM GOAL #2   Title  reduced BM to 1x every 2 hours    Time  4    Period  Weeks    Status  New    Target Date  07/29/17        PT Long Term Goals -  07/01/17 1124      PT LONG TERM GOAL #1   Title  able to tolerate nickel size diameter dilator for improved bowel movement    Baseline  q-tip    Time  8    Period  Weeks    Status  New    Target Date  08/26/17      PT LONG TERM GOAL #2   Title  able to sleep for 2 hour stretch due to reduction in waking to use the bathroom    Time  8    Period  Weeks    Status  New    Target Date  08/26/17      PT LONG TERM GOAL #3   Title  able to decrease use of depends to 5/day    Baseline  current 10/day    Time  8    Period  Weeks    Status  New    Target Date  08/26/17      PT LONG TERM GOAL #4   Title  ind with advanced HEP    Time  8    Period  Weeks    Status  New    Target Date  08/26/17            Plan - 07/05/17 1015    Clinical Impression Statement  Pt able to get contraction of up to 59mV and holding around 10-59mV during supine exercises.  Pt was able to make it through session with 2 trips to the bathroom and no leakage.  Pt did well with biofeedback so she could understand how she is engaging her muscles.  She did well with 3 second hold.  Pt will benefit from continueing current POC.    PT  Treatment/Interventions  ADLs/Self Care Home Management;Biofeedback;Cryotherapy;Electrical Stimulation;Moist Heat;Therapeutic activities;Therapeutic exercise;Neuromuscular re-education;Patient/family education;Passive range of motion;Taping;Dry needling;Manual techniques    PT Next Visit Plan  review dilator use and give handout, abdominal massage, cryotherapy for skin irritation, relaxation and breathing, stretch    PT Home Exercise Plan   Access Code: [email or web address]     Consulted and Agree with Plan of Care  Patient       Patient will benefit from skilled therapeutic intervention in order to improve the following deficits and impairments:  Pain, Impaired tone, Increased muscle spasms, Decreased strength, Decreased range of motion  Visit Diagnosis: Muscle weakness (generalized)  Unspecified lack of coordination  Other muscle spasm     Problem List Patient Active Problem List   Diagnosis Date Noted  . Port-A-Cath in place 04/20/2017  . Diarrhea 01/05/2017  . Rectal adenocarcinoma ([place]) 07/20/2016  . Iron deficiency anemia 07/20/2016  . Hemorrhoids 06/03/2016  . History of anemia 06/18/2015  . Fatigue 04/17/2014  . Vitamin D deficiency 11/15/2008  . CORONARY ARTERY DISEASE 11/15/2008  . DIABETES MELLITUS, HX OF 11/15/2008  . HYPERTENSION, HX OF 11/15/2008    Zannie Cove, PT 07/05/2017, 2:21 PM  [facility] [address] [place], [place], [address] Phone: [phone]   Fax:  [phone]  Name: Tonya [name] MRN: [id number] Date of Birth: 03/27/1950

## 2017-07-05 NOTE — Patient Instructions (Addendum)
Guide to Using a Anal Dilator  The anal dilator is used to stretch the anal canal from surgery or radiation. Is  a smooth plastic cylinder that is 6 inches long. It is used to stretch the anal canal to assist in having a bowel movement.  1.  Use the dilator your therapist has directed you to  2. Lubricate both the anus and tip of the dilator.  Do not use petroleum based lubricant due to increased risk of infection and more difficult to wash off.  3. Lay on your side to insert the tip of the dilator at a right angle to the rectum and lightly insert the dilator.  Exhale as you gently ease the dilator into the anal canal.  Breathe in deeply and inch the dilator deeper. See below for frequency: Anal stenosis- holds dilator in 3 minutes 2 times daily for 3-4 months  4. Remove the dilator. Wash your hands and the dilator with warm water and soap. Let the dilator dry completely to prevent bacteria build-up.   Relaxation Exercises with the Urge to Void   When you experience an urge to void:  FIRST  Stop and stand very still    Sit down if you can    Don't move    You need to stay very still to maintain control  SECOND Squeeze your pelvic floor muscles 5 times, like a quick flick, to keep from leaking  THIRD Relax  Take a deep breath and then let it out  Try to make the urge go away by using relaxation and visualization techniques  FINALLY When you feel the urge go away somewhat, walk normally to the bathroom.   If the urge gets suddenly stronger on the way, you may stop again and relax to regain control.

## 2017-07-08 ENCOUNTER — Telehealth: Payer: Self-pay

## 2017-07-08 ENCOUNTER — Encounter: Payer: Self-pay | Admitting: *Deleted

## 2017-07-08 NOTE — Progress Notes (Signed)
[place] Work  Clinical Social Work was referred by Futures trader for assessment of psychosocial needs.  Clinical Social Worker contacted patient by phone  to offer support and assess for needs.  Patient reported feeling discouraged due to stool incontinence. CSW validated patient's concerns and explored patient coping skills.  Patient not interested in discussing emotional concerns further at this time and quickly refused counseling services.  CSW discussed other possible support such as support groups, healing art classes, cooking classes, and gardening classes.  Patient agreed to contact CSW if she is interested in support in the future.      Kennith Center, LCSW  Clinical Social Worker [facility]

## 2017-07-08 NOTE — Telephone Encounter (Signed)
Patient called with c/o diarrhea, taking Lomotil which she just started not helping, left voice message to get Lomodium take 2 right away then 1 tab with every episode.

## 2017-07-11 ENCOUNTER — Telehealth: Payer: Self-pay | Admitting: Hematology

## 2017-07-11 ENCOUNTER — Telehealth: Payer: Self-pay

## 2017-07-11 NOTE — Telephone Encounter (Signed)
Patient called today for diarrhea.  I called her back, and left a message, to ensure she is taking Lomotil and Imodium adequately, she can use 2 tablets 4 times a day for each medication, I also recommend her to have low fiber diet.  I encouraged her to call me back tomorrow if she has further questions.  Tonya [name]  07/11/2017

## 2017-07-11 NOTE — Telephone Encounter (Signed)
Patient calls stating she is still having diarrhea has tried the Lomotil and Immodium not working, able to eat and drink.  Needs advice on what to do.  [phone]

## 2017-07-12 ENCOUNTER — Ambulatory Visit: Payer: Medicare HMO | Admitting: Physical Therapy

## 2017-07-12 DIAGNOSIS — M62838 Other muscle spasm: Secondary | ICD-10-CM | POA: Diagnosis not present

## 2017-07-12 DIAGNOSIS — R279 Unspecified lack of coordination: Secondary | ICD-10-CM

## 2017-07-12 DIAGNOSIS — M6281 Muscle weakness (generalized): Secondary | ICD-10-CM

## 2017-07-12 NOTE — Therapy (Addendum)
[facility] [address], [place], [place], [address] Phone: [phone]   Fax:  [phone]  Physical Therapy Treatment  Patient Details  Name: Tonya [name] MRN: [id number] Date of Birth: March 09, 1950 Referring Provider: Truitt Merle, MD   Encounter Date: 07/12/2017  PT End of Session - 07/12/17 1155    Visit Number  3    Date for PT Re-Evaluation  08/26/17    PT Start Time  3545    PT Stop Time  1230    PT Time Calculation (min)  42 min    Activity Tolerance  Patient tolerated treatment well    Behavior During Therapy  [facility] for tasks assessed/performed       Past Medical History:  Diagnosis Date  . Coronary artery disease 07/2003  . Myocardial infarction ([place])   . Obesity 2003   s/p gastric bypass     Past Surgical History:  Procedure Laterality Date  . CHOLECYSTECTOMY  1999  . GASTRIC BYPASS  2003  . IR FLUORO GUIDE PORT INSERTION RIGHT  01/25/2017  . IR US GUIDE VASC ACCESS RIGHT  01/25/2017    There were no vitals filed for this visit.  Subjective Assessment - 07/12/17 1154    Subjective  Pt reports no changes.  She has been doing exercises and is using dilators, up to 2nd dilator.    Patient Stated Goals  Stop going to the bathroom as much    Currently in Pain?  No/denies                       [facility] Adult PT Treatment/Exercise - 07/12/17 0001      Self-Care   Other Self-Care Comments   toileting techniques edu and demo      Lumbar Exercises: Standing   Row  Strengthening;20 reps;Theraband    Theraband Level (Row)  Level 3 (Green)    Shoulder Extension  Strengthening;20 reps;Theraband    Theraband Level (Shoulder Extension)  Level 2 (Red)    Other Standing Lumbar Exercises  hip abduction - 10 x each side      Lumbar Exercises: Seated   Sit to Stand  10 reps pelvic floor contraction      Lumbar Exercises: Supine   Clam  15 reps    Clam Limitations  red band    Bridge  20  reps    Large Ball Abdominal Isometric  20 reps UE flexion holding ball and engaged TrA; LE roll outs             PT Education - 07/12/17 1740    Education Details   Access Code: [email or web address] and toilet techniques    Person(s) Educated  Patient    Methods  Explanation;Demonstration;Verbal cues;Handout;Tactile cues    Comprehension  Verbalized understanding;Returned demonstration       PT Short Term Goals - 07/12/17 1157      PT SHORT TERM GOAL #1   Title  ind with using dilators    Time  4    Period  Weeks    Status  Achieved      PT SHORT TERM GOAL #2   Title  reduced BM to 1x every 2 hours    Time  4    Period  Weeks    Status  On-going        PT Long Term Goals - 07/12/17 1156      PT LONG TERM GOAL #1   Title  able to tolerate nickel size diameter dilator for improved bowel movement    Status  On-going      PT LONG TERM GOAL #2   Title  able to sleep for 2 hour stretch due to reduction in waking to use the bathroom    Baseline  30 min    Time  8    Period  Weeks    Status  On-going      PT LONG TERM GOAL #3   Title  able to decrease use of depends to 5/day    Time  8    Period  Weeks    Status  On-going      PT LONG TERM GOAL #4   Title  ind with advanced HEP    Status  On-going            Plan - 07/12/17 1731    Clinical Impression Statement  Pt had to use the bathroom 3x during treatment today.  She had one episode of leakage and able to make it 2x.  Pt was able to increase strengthening exercises and added to HEP.  Toileting techniques were discussed as well and patient appear to have good understanding.  Pt will benefit from skilled PT to continue working on improved core and pelvic floor strength as well as abilty to relax and bulge as needed.      PT Treatment/Interventions  ADLs/Self Care Home Management;Biofeedback;Cryotherapy;Electrical Stimulation;Moist Heat;Therapeutic activities;Therapeutic exercise;Neuromuscular  re-education;Patient/family education;Passive range of motion;Taping;Dry needling;Manual techniques    PT Next Visit Plan  review toileting techniques, biofeedback, cryotherapy for skin irritation, progress strength and ROM hip and low back    PT Home Exercise Plan   Access Code: [email or web address]     Recommended Other Services  eval 6/28; orders signed    Consulted and Agree with Plan of Care  Patient       Patient will benefit from skilled therapeutic intervention in order to improve the following deficits and impairments:  Pain, Impaired tone, Increased muscle spasms, Decreased strength, Decreased range of motion  Visit Diagnosis: Muscle weakness (generalized)  Unspecified lack of coordination  Other muscle spasm     Problem List Patient Active Problem List   Diagnosis Date Noted  . Port-A-Cath in place 04/20/2017  . Diarrhea 01/05/2017  . Rectal adenocarcinoma ([place]) 07/20/2016  . Iron deficiency anemia 07/20/2016  . Hemorrhoids 06/03/2016  . History of anemia 06/18/2015  . Fatigue 04/17/2014  . Vitamin D deficiency 11/15/2008  . CORONARY ARTERY DISEASE 11/15/2008  . DIABETES MELLITUS, HX OF 11/15/2008  . HYPERTENSION, HX OF 11/15/2008    Zannie Cove, PT 07/12/2017, 5:40 PM  [facility] [address], [place], [place], [address] Phone: [phone]   Fax:  [phone]  Name: Tonya [name] MRN: [id number] Date of Birth: 1950/05/28  PHYSICAL THERAPY DISCHARGE SUMMARY  Visits from Start of Care: 3  Current functional level related to goals / functional outcomes: See above details   Remaining deficits: See above   Education / Equipment: HEP  Plan: Patient agrees to discharge.  Patient goals were not met. Patient is being discharged due to not returning since the last visit.  ?????    [employer], PT 09/28/17 8:32 AM

## 2017-07-12 NOTE — Patient Instructions (Signed)
Toileting Techniques for Bowel Movements (Defecation) Using your belly (abdomen) and pelvic floor muscles to have a bowel movement is usually instinctive.  Sometimes people can have problems with these muscles and have to relearn proper defecation (emptying) techniques.  If you have weakness in your muscles, organs that are falling out, decreased sensation in your pelvis, or ignore your urge to go, you may find yourself straining to have a bowel movement.  You are straining if you are: . holding your breath or taking in a huge gulp of air and holding it  . keeping your lips and jaw tensed and closed tightly . turning red in the face because of excessive pushing or forcing . developing or worsening your  hemorrhoids . getting faint while pushing . not emptying completely and have to defecate many times a day  If you are straining, you are actually making it harder for yourself to have a bowel movement.  Many people find they are pulling up with the pelvic floor muscles and closing off instead of opening the anus. Due to lack pelvic floor relaxation and coordination the abdominal muscles, one has to work harder to push the feces out.  Many people have never been taught how to defecate efficiently and effectively.  Notice what happens to your body when you are having a bowel movement.  While you are sitting on the toilet pay attention to the following areas: . Jaw and mouth position . Angle of your hips   . Whether your feet touch the ground or not . Arm placement  . Spine position . Waist . Belly tension . Anus (opening of the anal canal)  An Evacuation/Defecation Plan   Here are the 4 basic points:  1. Lean forward enough for your elbows to rest on your knees 2. Support your feet on the floor or use a low stool if your feet don't touch the floor  3. Push out your belly as if you have swallowed a beach ball-you should feel a widening of your waist 4. Open and relax your pelvic floor muscles,  rather than tightening around the anus      The following conditions my require modifications to your toileting posture:  . If you have had surgery in the past that limits your back, hip, pelvic, knee or ankle flexibility . Constipation   Your healthcare practitioner may make the following additional suggestions and adjustments:  1) Sit on the toilet  a) Make sure your feet are supported. b) Notice your hip angle and spine position-most people find it effective to lean forward or raise their knees, which can help the muscles around the anus to relax  c) When you lean forward, place your forearms on your thighs for support  2) Relax suggestions a) Breath deeply in through your nose and out slowly through your mouth as if you are smelling the flowers and blowing out the candles. b) To become aware of how to relax your muscles, contracting and releasing muscles can be helpful.  Pull your pelvic floor muscles in tightly by using the image of holding back gas, or closing around the anus (visualize making a circle smaller) and lifting the anus up and in.  Then release the muscles and your anus should drop down and feel open. Repeat 5 times ending with the feeling of relaxation. c) Keep your pelvic floor muscles relaxed; let your belly bulge out. d) The digestive tract starts at the mouth and ends at the anal opening, so be   sure to relax both ends of the tube.  Place your tongue on the roof of your mouth with your teeth separated.  This helps relax your mouth and will help to relax the anus at the same time.  3) Empty (defecation) a) Keep your pelvic floor and sphincter relaxed, then bulge your anal muscles.  Make the anal opening wide.  b) Stick your belly out as if you have swallowed a beach ball. c) Make your belly wall hard using your belly muscles while continuing to breathe. Doing this makes it easier to open your anus. d) Breath out and give a grunt (or try using other sounds such as  ahhhh, shhhhh, ohhhh or grrrrrrr).  4) Finish a) As you finish your bowel movement, pull the pelvic floor muscles up and in.  This will leave your anus in the proper place rather than remaining pushed out and down. If you leave your anus pushed out and down, it will start to feel as though that is normal and give you incorrect signals about needing to have a bowel movement.   [facility] [address] [place], [place] [address]  

## 2017-07-13 DIAGNOSIS — R69 Illness, unspecified: Secondary | ICD-10-CM | POA: Diagnosis not present

## 2017-07-14 ENCOUNTER — Encounter: Payer: Medicare HMO | Admitting: Physical Therapy

## 2017-07-19 ENCOUNTER — Ambulatory Visit: Payer: Medicare HMO | Admitting: Physical Therapy

## 2017-07-21 ENCOUNTER — Encounter: Payer: Medicare HMO | Admitting: Physical Therapy

## 2017-08-10 ENCOUNTER — Telehealth: Payer: Self-pay | Admitting: *Deleted

## 2017-08-10 NOTE — Telephone Encounter (Addendum)
Received call from pt stating that she is having some swelling in her feet that started yest.  She reports that her shoes are tight but able to walk & is wondering if she needs some lasix.  She states that she is drinking plenty of fluids & is still having diarrhea & taking lomotil.  Message to Dr Burr Medico.  Discussed with DR Burr Medico & suggested pt try compression stockings.  Pt reports that she has at home & will try.  She reports that she is not doing anything different than before.  She is taking ibuprofen for bottom pain but her surgeon is ordering her some codeine & hopes that this will help pain & decrease diarrhea.  Requested that she let us know if better or worse.

## 2017-08-16 ENCOUNTER — Ambulatory Visit: Payer: Medicare HMO | Admitting: Nutrition

## 2017-08-16 ENCOUNTER — Telehealth: Payer: Self-pay

## 2017-08-16 ENCOUNTER — Inpatient Hospital Stay (HOSPITAL_BASED_OUTPATIENT_CLINIC_OR_DEPARTMENT_OTHER): Payer: Medicare HMO | Admitting: Medical

## 2017-08-16 ENCOUNTER — Other Ambulatory Visit: Payer: Self-pay | Admitting: Medical

## 2017-08-16 ENCOUNTER — Other Ambulatory Visit: Payer: Self-pay

## 2017-08-16 ENCOUNTER — Inpatient Hospital Stay: Payer: Medicare HMO | Attending: Hematology

## 2017-08-16 VITALS — BP 115/67 | HR 79 | Temp 98.6°F | Resp 18 | Ht 60.0 in | Wt 129.8 lb

## 2017-08-16 DIAGNOSIS — C20 Malignant neoplasm of rectum: Secondary | ICD-10-CM

## 2017-08-16 DIAGNOSIS — R600 Localized edema: Secondary | ICD-10-CM | POA: Insufficient documentation

## 2017-08-16 DIAGNOSIS — M7989 Other specified soft tissue disorders: Secondary | ICD-10-CM

## 2017-08-16 DIAGNOSIS — K591 Functional diarrhea: Secondary | ICD-10-CM | POA: Diagnosis not present

## 2017-08-16 DIAGNOSIS — Z800 Family history of malignant neoplasm of digestive organs: Secondary | ICD-10-CM | POA: Diagnosis not present

## 2017-08-16 DIAGNOSIS — F329 Major depressive disorder, single episode, unspecified: Secondary | ICD-10-CM | POA: Insufficient documentation

## 2017-08-16 DIAGNOSIS — Z79899 Other long term (current) drug therapy: Secondary | ICD-10-CM | POA: Insufficient documentation

## 2017-08-16 DIAGNOSIS — E559 Vitamin D deficiency, unspecified: Secondary | ICD-10-CM | POA: Insufficient documentation

## 2017-08-16 DIAGNOSIS — G620 Drug-induced polyneuropathy: Secondary | ICD-10-CM | POA: Diagnosis not present

## 2017-08-16 DIAGNOSIS — R601 Generalized edema: Secondary | ICD-10-CM

## 2017-08-16 DIAGNOSIS — Z7982 Long term (current) use of aspirin: Secondary | ICD-10-CM | POA: Insufficient documentation

## 2017-08-16 DIAGNOSIS — Z808 Family history of malignant neoplasm of other organs or systems: Secondary | ICD-10-CM | POA: Diagnosis not present

## 2017-08-16 DIAGNOSIS — R159 Full incontinence of feces: Secondary | ICD-10-CM

## 2017-08-16 DIAGNOSIS — I252 Old myocardial infarction: Secondary | ICD-10-CM | POA: Diagnosis not present

## 2017-08-16 DIAGNOSIS — Z9884 Bariatric surgery status: Secondary | ICD-10-CM | POA: Diagnosis not present

## 2017-08-16 DIAGNOSIS — Z923 Personal history of irradiation: Secondary | ICD-10-CM | POA: Insufficient documentation

## 2017-08-16 DIAGNOSIS — E869 Volume depletion, unspecified: Secondary | ICD-10-CM

## 2017-08-16 DIAGNOSIS — D509 Iron deficiency anemia, unspecified: Secondary | ICD-10-CM | POA: Insufficient documentation

## 2017-08-16 DIAGNOSIS — Z9221 Personal history of antineoplastic chemotherapy: Secondary | ICD-10-CM

## 2017-08-16 LAB — CMP (CANCER CENTER ONLY)
ALK PHOS: 123 U/L (ref 38–126)
ALT: 29 U/L (ref 0–44)
AST: 29 U/L (ref 15–41)
Albumin: 3.4 g/dL — ABNORMAL LOW (ref 3.5–5.0)
Anion gap: 10 (ref 5–15)
BUN: 18 mg/dL (ref 8–23)
CO2: 24 mmol/L (ref 22–32)
CREATININE: 0.66 mg/dL (ref 0.44–1.00)
Calcium: 8.6 mg/dL — ABNORMAL LOW (ref 8.9–10.3)
Chloride: 107 mmol/L (ref 98–111)
Glucose, Bld: 83 mg/dL (ref 70–99)
Potassium: 3.9 mmol/L (ref 3.5–5.1)
Sodium: 141 mmol/L (ref 135–145)
Total Bilirubin: 0.3 mg/dL (ref 0.3–1.2)
Total Protein: 6.4 g/dL — ABNORMAL LOW (ref 6.5–8.1)

## 2017-08-16 LAB — CBC WITH DIFFERENTIAL (CANCER CENTER ONLY)
BASOS ABS: 0 10*3/uL (ref 0.0–0.1)
BASOS PCT: 0 %
EOS ABS: 0.1 10*3/uL (ref 0.0–0.5)
Eosinophils Relative: 1 %
HCT: 33 % — ABNORMAL LOW (ref 34.8–46.6)
HEMOGLOBIN: 10.4 g/dL — AB (ref 11.6–15.9)
Lymphocytes Relative: 8 %
Lymphs Abs: 0.5 10*3/uL — ABNORMAL LOW (ref 0.9–3.3)
MCH: 31 pg (ref 25.1–34.0)
MCHC: 31.5 g/dL (ref 31.5–36.0)
MCV: 98.2 fL (ref 79.5–101.0)
Monocytes Absolute: 0.5 10*3/uL (ref 0.1–0.9)
Monocytes Relative: 8 %
NEUTROS PCT: 83 %
Neutro Abs: 5 10*3/uL (ref 1.5–6.5)
Platelet Count: 230 10*3/uL (ref 145–400)
RBC: 3.36 MIL/uL — AB (ref 3.70–5.45)
RDW: 13.1 % (ref 11.2–14.5)
WBC: 6.1 10*3/uL (ref 3.9–10.3)

## 2017-08-16 LAB — BRAIN NATRIURETIC PEPTIDE: B NATRIURETIC PEPTIDE 5: 19.9 pg/mL (ref 0.0–100.0)

## 2017-08-16 MED ORDER — DIPHENOXYLATE-ATROPINE 2.5-0.025 MG PO TABS: ORAL_TABLET | ORAL | 3 refills | Status: DC

## 2017-08-16 NOTE — Progress Notes (Signed)
Sympt[facility] Management Clinic Progress Note   Tonya [name] [id number] 1950-10-15 67 y.o.  Tonya [name] Tonya [name] is managed by Dr. Truitt Merle  Actively treated with chemotherapy/immunotherapy: no  Assessment: Plan:    Functional diarrhea - Plan: diphenoxylate-atropine (LOMOTIL) 2.5-0.025 MG tablet  Rectal adenocarcinoma ([place])  Bilateral lower extremity edema   Diarrhea: The patient was given a refill of Lomotil.  Rectal adenocarcinoma: The patient continues to be followed conservatively by Dr. Truitt Merle and is scheduled to see her on 08/29/2017.  Bilateral lower extremity edema: I discussed with the patient that her bilateral lower extremity edema could be associated with her hypoalbuminemia.  I discussed this with our dietitian who will send the patient information regarding diets that are higher in protein but are lower in fiber.  I also reviewed with the patient that I believe that Lasix should be avoided given her fluid loss with diarrhea.  I am concerned that this could precipitate hypotension and place her at risk for dehydration.  Please see After Visit Summary for patient specific instructions.  Future Appointments  Date Time Provider [place]  08/29/2017 10:15 AM [facility] LAB 3 [facility] None  08/29/2017 10:30 AM [facility] [place] FLUSH [facility] None  08/29/2017 11:15 AM Truitt Merle, MD [facility] None  10/19/2017 10:30 AM [facility] 2 [facility] [place]    No orders of the defined types were placed in this encounter.      Subjective:   Patient ID:  Tonya [name] is a 67 y.o. (DOB Apr 28, 1950) female.  Chief Complaint:  Chief Complaint  Patient presents with  . Edema    HPI Tonya [name] is a 67 y.o. woman with an invasive low rectal adenocarcinoma who was treat with concurrent chemoradiation which she completed on 09/16/2016. This was followed by surgery on 12/06/16 with Dr. Drue Flirt at [facility]. The surgical path showed near  complete response to neoadjuvant chemoradiation, but it was still a T3 lesion, and unfortunately the radial margin was positive and one out of 12 nodes was positive. She is staged as a stage III with moderate risk of distant recurrence and high risk of local recurrence. The patient elected to follow surgery with 4 months adjuvant chemotherapy FOLFOX which she completed on 05/04/2017. She underwent ileostomy reversal on June 13, 2017. Since this, she has been followed conservatively with continued colon cancer surveillance. Ms. Tonya [name] called on 08/10/2017 reporting that she had had some swelling in her feet since the day before her call. She reported that her shoes were tight but that she was able to walk. She questioned if she needed lasix.  She was drinking plenty of fluids. She continues to have longstanding diarrhea despite taking lomotil.  It was suggested that she try compression stockings.  She was agreeable to try this. She reported that she was not doing anything different.  She was taking ibuprofen for bottom pain. Her surgeon was ordering codeine for her. It was hoped that this would better control her pain and would decrease diarrhea.  The patient called again today reporting that her feet continued to be swollen despite elevating her feet during the day.  She is having multiple bowel movements throughout the day and has had episodes of incontinence.  She states that she has had 6 bowel movements since arriving here to be seen today and has had incontinence also.  She request a refill of Lomotil.  She states that she regrets being treated for he cancer because of the diarrhea  that she has experienced since having a reversal of her ileostomy.  Medications: I have reviewed the patient's current medications.  Allergies:  Allergies  Allergen Reactions  . Bactrim [Sulfamethoxazole-Trimethoprim] Anaphylaxis, Hives and Other (See Comments)  . Phenergan [Promethazine Hcl]     Hives  . Sulfa  Antibiotics   . Penicillins     REACTION: As a child.    Past Medical History:  Diagnosis Date  . Coronary artery disease 07/2003  . Myocardial infarction ([place])   . Obesity 2003   s/p gastric bypass     Past Surgical History:  Procedure Laterality Date  . CHOLECYSTECTOMY  1999  . GASTRIC BYPASS  2003  . IR FLUORO GUIDE PORT INSERTION RIGHT  01/25/2017  . IR US GUIDE VASC ACCESS RIGHT  01/25/2017    Family History  Problem Relation Age of Onset  . Heart attack Mother        d.93  . Throat cancer Mother 36  . Clotting disorder Mother   . Liver disease Mother   . Kidney disease Mother   . Heart attack Father        d.92  . Prostate cancer Father 29  . Uterine cancer Sister 50       treated with total hysterectomy and radiation  . Thyroid cancer Sister 59       treated with thyroidectomy  . Cancer Maternal Uncle        d.80s unspecified type of cancer. History of smoking.  Marland Kitchen Uterine cancer Sister 97  . Colon cancer Cousin 44       paternal first-cousin    Social History   Socioeconomic History  . Marital status: Single    Spouse name: Not on file  . Number of children: 0  . Years of education: Not on file  . Highest education level: Not on file  Occupational History  . Occupation: retired  Scientific laboratory technician  . Financial resource strain: Not on file  . Food insecurity:    Worry: Not on file    Inability: Not on file  . Transportation needs:    Medical: Not on file    Non-medical: Not on file  Tobacco Use  . Smoking status: Never Smoker  . Smokeless tobacco: Never Used  Substance and Sexual Activity  . Alcohol use: No  . Drug use: No  . Sexual activity: Not on file  Lifestyle  . Physical activity:    Days per week: Not on file    Minutes per session: Not on file  . Stress: Not on file  Relationships  . Social connections:    Talks on phone: Not on file    Gets together: Not on file    Attends religious service: Not on file    Active member of club or  organization: Not on file    Attends meetings of clubs or organizations: Not on file    Relationship status: Not on file  . Intimate partner violence:    Fear of current or ex partner: Not on file    Emotionally abused: Not on file    Physically abused: Not on file    Forced sexual activity: Not on file  Other Topics Concern  . Not on file  Social History Narrative  . Not on file    Past Medical History, Surgical history, Social history, and Family history were reviewed and updated as appropriate.   Please see review of systems for further details on the patient's review from  today.   Review of Systems:  Review of Systems  Constitutional: Negative for appetite change, chills, diaphoresis, fever and unexpected weight change.  Respiratory: Negative for chest tightness and shortness of breath.   Cardiovascular: Positive for leg swelling. Negative for chest pain.  Gastrointestinal: Positive for diarrhea. Negative for abdominal distention, abdominal pain, anal bleeding, blood in stool, constipation, nausea, rectal pain and vomiting.    Objective:   Physical Exam:  BP 115/67 (BP Location: Left Arm, Patient Position: Sitting)   Pulse 79   Temp 98.6 F (37 C) (Oral)   Resp 18   Ht 5' (1.524 m)   Wt 129 lb 12.8 oz (58.9 kg)   SpO2 100%   BMI 25.35 kg/m  ECOG: 0  Physical Exam  Constitutional: No distress.  HENT:  Head: Normocephalic and atraumatic.  Eyes: Right eye exhibits no discharge. Left eye exhibits no discharge. No scleral icterus.  Cardiovascular: Normal rate, regular rhythm and normal heart sounds. Exam reveals no gallop and no friction rub.  No murmur heard. Pulmonary/Chest: Effort normal and breath sounds normal. No respiratory distress. She has no wheezes. She has no rales.  Musculoskeletal: She exhibits edema (1+ nonpitting edema to the knees bilaterally.).  Neurological: She is alert.  Skin: Skin is warm and dry. She is not diaphoretic.    Lab Review:       Component Value Date/Time   NA 141 08/16/2017 1042   NA 138 01/05/2017 0906   K 3.9 08/16/2017 1042   K 4.4 01/05/2017 0906   CL 107 08/16/2017 1042   CO2 24 08/16/2017 1042   CO2 25 01/05/2017 0906   GLUCOSE 83 08/16/2017 1042   GLUCOSE 102 01/05/2017 0906   BUN 18 08/16/2017 1042   BUN 22.5 01/05/2017 0906   CREATININE 0.66 08/16/2017 1042   CREATININE 0.8 01/05/2017 0906   CALCIUM 8.6 (L) 08/16/2017 1042   CALCIUM 9.6 01/05/2017 0906   PROT 6.4 (L) 08/16/2017 1042   PROT 6.9 01/05/2017 0906   ALBUMIN 3.4 (L) 08/16/2017 1042   ALBUMIN 3.4 (L) 01/05/2017 0906   AST 29 08/16/2017 1042   AST 18 01/05/2017 0906   ALT 29 08/16/2017 1042   ALT 17 01/05/2017 0906   ALKPHOS 123 08/16/2017 1042   ALKPHOS 76 01/05/2017 0906   BILITOT 0.3 08/16/2017 1042   BILITOT 0.25 01/05/2017 0906   GFRNONAA >60 08/16/2017 1042   GFRAA >60 08/16/2017 1042       Component Value Date/Time   WBC 6.1 08/16/2017 1042   WBC 4.2 05/04/2017 0737   RBC 3.36 (L) 08/16/2017 1042   HGB 10.4 (L) 08/16/2017 1042   HGB 11.8 01/05/2017 0907   HCT 33.0 (L) 08/16/2017 1042   HCT 36.6 01/05/2017 0907   PLT 230 08/16/2017 1042   PLT 348 01/05/2017 0907   MCV 98.2 08/16/2017 1042   MCV 92.0 01/05/2017 0907   MCH 31.0 08/16/2017 1042   MCHC 31.5 08/16/2017 1042   RDW 13.1 08/16/2017 1042   RDW 12.8 01/05/2017 0907   LYMPHSABS 0.5 (L) 08/16/2017 1042   LYMPHSABS 0.5 (L) 01/05/2017 0907   MONOABS 0.5 08/16/2017 1042   MONOABS 0.6 01/05/2017 0907   EOSABS 0.1 08/16/2017 1042   EOSABS 0.3 01/05/2017 0907   BASOSABS 0.0 08/16/2017 1042   BASOSABS 0.0 01/05/2017 0907   -------------------------------  Imaging from last 24 hours (if applicable):  Radiology interpretation: No results found.      This case was discussed with Dr. Burr Medico. She expressed agreement  with my management of this patient.

## 2017-08-16 NOTE — Telephone Encounter (Signed)
Patient calls stating she called last week and report that her feet have been swelling.  She was told to elevate her feet during the day which she has but they are still swelling.  She wants to know if she needs to do anything else?  Denies pain in lower extremities just swelling.

## 2017-08-16 NOTE — Patient Instructions (Signed)
Edema Edema is when you have too much fluid in your body or under your skin. Edema may make your legs, feet, and ankles swell up. Swelling is also common in looser tissues, like around your eyes. This is a common condition. It gets more common as you get older. There are many possible causes of edema. Eating too much salt (sodium) and being on your feet or sitting for a long time can cause edema in your legs, feet, and ankles. Hot weather may make edema worse. Edema is usually painless. Your skin may look swollen or shiny. Follow these instructions at home:  Keep the swollen body part raised (elevated) above the level of your heart when you are sitting or lying down.  Do not sit still or stand for a long time.  Do not wear tight clothes. Do not wear garters on your upper legs.  Exercise your legs. This can help the swelling go down.  Wear elastic bandages or support stockings as told by your doctor.  Eat a low-salt (low-sodium) diet to reduce fluid as told by your doctor.  Depending on the cause of your swelling, you may need to limit how much fluid you drink (fluid restriction).  Take over-the-counter and prescription medicines only as told by your doctor. Contact a doctor if:  Treatment is not working.  You have heart, liver, or kidney disease and have symptoms of edema.  You have sudden and unexplained weight gain. Get help right away if:  You have shortness of breath or chest pain.  You cannot breathe when you lie down.  You have pain, redness, or warmth in the swollen areas.  You have heart, liver, or kidney disease and get edema all of a sudden.  You have a fever and your symptoms get worse all of a sudden. Summary  Edema is when you have too much fluid in your body or under your skin.  Edema may make your legs, feet, and ankles swell up. Swelling is also common in looser tissues, like around your eyes.  Raise (elevate) the swollen body part above the level of your  heart when you are sitting or lying down.  Follow your doctor's instructions about diet and how much fluid you can drink (fluid restriction). This information is not intended to replace advice given to you by your health care provider. Make sure you discuss any questions you have with your health care provider. Document Released: 06/09/2007 Document Revised: 01/09/2016 Document Reviewed: 01/09/2016 Elsevier Interactive Patient Education  2017 Elsevier Inc.  

## 2017-08-16 NOTE — Progress Notes (Signed)
Pt presents with bilat lower leg edema.  Non pitting.  Reports increased tenderness, redness, and swelling as the day progresses.  Tried elevation with some improvement.  Ambulatory w/steady gait.

## 2017-08-16 NOTE — Telephone Encounter (Signed)
Please arrange her to see [facility] today, thanks  Truitt Merle MD

## 2017-08-16 NOTE — Telephone Encounter (Signed)
Called patient instructed him to come in today to be seen in [facility] arrive for lab 10:30 and see Sandi Mealy at 11:00 patient verbalized an understanding.

## 2017-08-16 NOTE — Progress Notes (Signed)
I was asked to see patient by symptom management physician assistant.  Patient has been experiencing diarrhea secondary to rectal cancer.  She has been miserable and has multiple stools daily.  Patient left cancer center before I could see her today.  As I reviewed her chart, noted patient had had an appointment with me in January but canceled it.  I will mail a copy of a low fiber diet to patient.  She is free to call me as needed.  **Disclaimer: This note was dictated with voice recognition software. Similar sounding words can inadvertently be transcribed and this note may contain transcription errors which may not have been corrected upon publication of note.**

## 2017-08-18 ENCOUNTER — Telehealth: Payer: Self-pay

## 2017-08-18 ENCOUNTER — Telehealth: Payer: Self-pay | Admitting: Internal Medicine

## 2017-08-18 NOTE — Telephone Encounter (Signed)
Completed message

## 2017-08-18 NOTE — Telephone Encounter (Signed)
Dr. Henrene Pastor please see note below and advise.

## 2017-08-18 NOTE — Telephone Encounter (Signed)
Spoke with Sheryl at Dr. Ernestina Penna office and let her know Dr. Blanch Media recommendations. She will notify pt.

## 2017-08-18 NOTE — Telephone Encounter (Signed)
I actually never saw this patient for this problem. Diagnosed last year with rectal cancer at colonoscopy and has not been seen since. She had her surgery with Dr. Drue Flirt at [facility]. Recently had reversal of ileostomy. SHE SHOULD GO BACK TO DR. Drue Flirt regarding her postoperative fecal incontinence. Dr. Drue Flirt is most expert in this area.

## 2017-08-18 NOTE — Telephone Encounter (Signed)
Left message for Dr. Drue Flirt to call Dr. Burr Medico on her cell phone.

## 2017-08-18 NOTE — Telephone Encounter (Signed)
Patient calls requesting a referral  to Dr. Burna Mortimer (GI), see is having consistent bowel problems, bowel incontinence, goes to the bathroom every 1/2 hour to 1 hour to defecate.  Unable to leave her home except for MD appointments.   Please advise.

## 2017-08-18 NOTE — Telephone Encounter (Signed)
Tonya [name], please call Dr. Geronimo Boot office and let her call me regarding this pt, thanks .   Tonya Merle MD

## 2017-08-18 NOTE — Telephone Encounter (Signed)
Called [facility] on patient's behalf, patient requesting to switch from Dr. Henrene Pastor to Dr. Burna Mortimer.  Message to be sent to Dr. Blanch Media office he will have to agree to turn over her care.   They will be in touch with the patient.  I have left the patient know.

## 2017-08-29 ENCOUNTER — Inpatient Hospital Stay: Payer: Medicare HMO | Admitting: Hematology

## 2017-08-29 ENCOUNTER — Inpatient Hospital Stay: Payer: Medicare HMO

## 2017-08-29 ENCOUNTER — Telehealth: Payer: Self-pay

## 2017-08-29 ENCOUNTER — Other Ambulatory Visit: Payer: Medicare HMO

## 2017-08-29 ENCOUNTER — Ambulatory Visit: Payer: Medicare HMO | Admitting: Hematology

## 2017-08-29 VITALS — BP 117/73 | HR 60 | Temp 98.1°F | Resp 17 | Ht 60.0 in | Wt 129.2 lb

## 2017-08-29 DIAGNOSIS — G620 Drug-induced polyneuropathy: Secondary | ICD-10-CM | POA: Diagnosis not present

## 2017-08-29 DIAGNOSIS — E869 Volume depletion, unspecified: Secondary | ICD-10-CM

## 2017-08-29 DIAGNOSIS — D500 Iron deficiency anemia secondary to blood loss (chronic): Secondary | ICD-10-CM | POA: Diagnosis not present

## 2017-08-29 DIAGNOSIS — Z800 Family history of malignant neoplasm of digestive organs: Secondary | ICD-10-CM

## 2017-08-29 DIAGNOSIS — R159 Full incontinence of feces: Secondary | ICD-10-CM

## 2017-08-29 DIAGNOSIS — Z79899 Other long term (current) drug therapy: Secondary | ICD-10-CM

## 2017-08-29 DIAGNOSIS — K909 Intestinal malabsorption, unspecified: Secondary | ICD-10-CM

## 2017-08-29 DIAGNOSIS — C20 Malignant neoplasm of rectum: Secondary | ICD-10-CM | POA: Diagnosis not present

## 2017-08-29 DIAGNOSIS — Z9884 Bariatric surgery status: Secondary | ICD-10-CM

## 2017-08-29 DIAGNOSIS — R197 Diarrhea, unspecified: Secondary | ICD-10-CM

## 2017-08-29 DIAGNOSIS — K591 Functional diarrhea: Secondary | ICD-10-CM

## 2017-08-29 DIAGNOSIS — Z923 Personal history of irradiation: Secondary | ICD-10-CM

## 2017-08-29 DIAGNOSIS — M7989 Other specified soft tissue disorders: Secondary | ICD-10-CM

## 2017-08-29 DIAGNOSIS — E559 Vitamin D deficiency, unspecified: Secondary | ICD-10-CM

## 2017-08-29 DIAGNOSIS — Z95828 Presence of other vascular implants and grafts: Secondary | ICD-10-CM

## 2017-08-29 DIAGNOSIS — I252 Old myocardial infarction: Secondary | ICD-10-CM

## 2017-08-29 DIAGNOSIS — R600 Localized edema: Secondary | ICD-10-CM | POA: Diagnosis not present

## 2017-08-29 DIAGNOSIS — Z9221 Personal history of antineoplastic chemotherapy: Secondary | ICD-10-CM

## 2017-08-29 DIAGNOSIS — Z7982 Long term (current) use of aspirin: Secondary | ICD-10-CM

## 2017-08-29 DIAGNOSIS — D509 Iron deficiency anemia, unspecified: Secondary | ICD-10-CM | POA: Diagnosis not present

## 2017-08-29 DIAGNOSIS — F329 Major depressive disorder, single episode, unspecified: Secondary | ICD-10-CM

## 2017-08-29 DIAGNOSIS — Z808 Family history of malignant neoplasm of other organs or systems: Secondary | ICD-10-CM

## 2017-08-29 LAB — CBC WITH DIFFERENTIAL (CANCER CENTER ONLY)
Basophils Absolute: 0 10*3/uL (ref 0.0–0.1)
Basophils Relative: 0 %
EOS ABS: 0 10*3/uL (ref 0.0–0.5)
EOS PCT: 1 %
HCT: 31.5 % — ABNORMAL LOW (ref 34.8–46.6)
HEMOGLOBIN: 10 g/dL — AB (ref 11.6–15.9)
LYMPHS ABS: 0.6 10*3/uL — AB (ref 0.9–3.3)
LYMPHS PCT: 9 %
MCH: 31 pg (ref 25.1–34.0)
MCHC: 31.7 g/dL (ref 31.5–36.0)
MCV: 97.5 fL (ref 79.5–101.0)
Monocytes Absolute: 0.5 10*3/uL (ref 0.1–0.9)
Monocytes Relative: 8 %
Neutro Abs: 4.8 10*3/uL (ref 1.5–6.5)
Neutrophils Relative %: 82 %
PLATELETS: 219 10*3/uL (ref 145–400)
RBC: 3.23 MIL/uL — AB (ref 3.70–5.45)
RDW: 13.3 % (ref 11.2–14.5)
WBC: 5.8 10*3/uL (ref 3.9–10.3)

## 2017-08-29 LAB — IRON AND TIBC
IRON: 37 ug/dL — AB (ref 41–142)
Saturation Ratios: 16 % — ABNORMAL LOW (ref 21–57)
TIBC: 239 ug/dL (ref 236–444)
UIBC: 202 ug/dL

## 2017-08-29 LAB — RETICULOCYTES
RBC.: 3.23 MIL/uL — ABNORMAL LOW (ref 3.70–5.45)
RETIC CT PCT: 2.1 % (ref 0.7–2.1)
Retic Count, Absolute: 67.8 10*3/uL (ref 33.7–90.7)

## 2017-08-29 LAB — FERRITIN: Ferritin: 147 ng/mL (ref 11–307)

## 2017-08-29 MED ORDER — SODIUM CHLORIDE 0.9% FLUSH
10.0000 mL | Freq: Once | INTRAVENOUS | Status: AC
  Administered 2017-08-29: 10 mL
  Filled 2017-08-29: qty 10

## 2017-08-29 MED ORDER — HEPARIN SOD (PORK) LOCK FLUSH 100 UNIT/ML IV SOLN
250.0000 [IU] | Freq: Once | INTRAVENOUS | Status: AC | PRN
  Administered 2017-08-29: 250 [IU]
  Filled 2017-08-29: qty 5

## 2017-08-29 NOTE — Telephone Encounter (Signed)
Currently no los per 8/26. Patient stated a lab/flush on day of CT. And return in 3 months.

## 2017-08-29 NOTE — Progress Notes (Signed)
[place]  [phone]) [phone] Fax:(336) [phone]  Clinic Follow Up Note   Patient Care Team: Lucille Passy, MD as PCP - General (Family Medicine) Irene Shipper, MD as Consulting Physician (Gastroenterology) Leighton Ruff, MD as Consulting Physician (General Surgery) Truitt Merle, MD as Consulting Physician (Hematology) Kyung Rudd, MD as Consulting Physician (Radiation Oncology)   Date of Service:  08/29/2017  CHIEF COMPLAINTS:  Follow up rectal Adenocarcinoma  Oncology History   Cancer Staging Rectal adenocarcinoma [facility]) Staging form: Colon and Rectum, AJCC 8th Edition - Clinical stage from 07/09/2016: Stage IIA (cT3, cN0, cM0) - Signed by Truitt Merle, MD on 08/16/2016       Rectal adenocarcinoma ([place])   07/09/2016 Pathology Results    Diagnosis Surgical [P], rectum mass - INVASIVE ADENOCARCINOMA. - SEE COMMENT.    07/09/2016 Procedure    Colonoscopy A non-obstructing large friable mass was found in the distal rectum. This began at approximately 1 cm above the anal verge and extended proximal for a distance of 8-10 cm. The lesion was bulky and involved approximately 75% of the luminal circumference. Multiple biopsies were taken. A few small-mouthed diverticula were found in the left colon. The exam was otherwise without abnormality on direct views. Retroflexion not performed purposely.    07/12/2016 Imaging    CT C/A/P with contrast IMPRESSION: Rectal wall thickening/mass, compatible with known rectal adenocarcinoma. No findings specific for metastatic disease in the chest, abdomen, or pelvis. 2.2 cm probable hemangioma in segment 4B, although poorly evaluated. Consider MRI abdomen with/ without multihance contrast for definitive characterization, as clinically warranted. Bilateral adrenal nodules measuring up to 1.6 cm on the right, likely reflecting benign adrenal adenomas, although technically indeterminate. If MRI abdomen is performed, these can be  definitively characterized at that time.    07/20/2016 Initial Diagnosis    Rectal adenocarcinoma ([place])    08/03/2016 Imaging    MRI AP W WO Contrast 08/03/16 IMPRESSION: 1. Large rectal mass measures 6.1 cm in length. No obstruction identified. This is compatible with at least a T3bN0M0 lesion. 2. No specific features highly suspicious for nodal metastasis or metastatic disease to the upper abdomen. 3. Indeterminate arterial phase enhancing lesion within the anterior dome of liver measures less than 1 cm. This may represent a benign liver lesions such as FNH or adenoma. Less favored with the hypervascular liver metastasis. Followup imaging at 6 months with repeat MRI of the liver is advise. 4. Bilateral adrenal adenomas    08/04/2016 - 09/16/2016 Radiation Therapy    Concurrent chemo radiation with Dr. [name]    08/04/2016 - 09/16/2016 Chemotherapy    Concurrent chemo radiation with Xeloda, '1500mg'$  twice daily    12/06/2016 Surgery    ULTRA LOW ANTERIOR RESECTION OF SIGMOID COLON AND RECTUM WITH COLOANAL ANASTAMOSIS AND LOOP ILEOSTOMY  by Dr. Drue Flirt at [facility]  12/06/16    12/06/2016 Pathology Results    FINAL PATHOLOGIC DIAGNOSIS 12/06/16 MICROSCOPIC EXAMINATION AND DIAGNOSIS  A.ANAL CANAL, MUCOSECTOMY: Benign rectal and fibroadipose tissue. Negative for malignancy.  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 Mucinous tumor focally invades through the muscularis propria into pericolorectal tissue. Radial margin interpreted as involved by invasive carcinoma (invasive acellular mucin withrare viable tumor cells focally comes to within 0.2 mm [0.02 cm] of the inked radial soft tissue margin). Treatment effect present, predominantly invasive acellular mucin associated with only  very rare small groups of  viable tumor cells (near complete response). 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 AJCC Pathologic Stage: ypT3 pN1a. See Cancer Case Summary.  C.POSTERIOR ANAL CANAL, EXCISION: Benign rectal tissue. Negative for malignancy.    01/26/2017 - 05/04/2017 Chemotherapy    Adjuvant FOLFOX q2 weeks, for 8 cycles          04/04/2017 Imaging    CT CAP IMPRESSION: New complete interval resolution of bulky rectal soft tissue mass since prior exam.  No evidence of local or distant metastatic disease.    06/13/2017 Surgery    CLOSURE OF LOOP ILEOSTOMY Dannielle Burn, MD [facility] 06/13/2017     HISTORY OF PRESENTING ILLNESS: 07/20/16 Tonya Keys [facility] 67 y.o. [name] is here because of recently discovered invasive adenocarcinoma. She is accompanied by a friend today. The patient underwent colonoscopy on 07/09/16 with Dr. [name]. According to Dr. Blanch Media note from that encounter, the patient presented with complaints of rectal bleeding and iron deficiency anemia. She reports she was told she has hemorrhoids and initially was not concerned about the rectal bleeding. Her most recent prior colonoscopy was 2005. On exam, Dr. Henrene Pastor noted a non-obstructing large friable mass was found in the distal rectum. This began at approximately 1 cm above the anal verge and extended proximal for a distance of 8-10 cm. The lesion was bulky and involved approximately 75% of the luminal circumference. Biopsy of the rectal mass on 07/09/16 showed invasive adenocarcinoma.  CT C/A/P on 07/12/16 revealed rectal wall thickening/mass, compatible with known rectal adenocarcinoma.No findings specific for metastatic disease in the chest, abdomen, or pelvis. 2.2 cm probable  hemangioma in segment 4B, although poorly evaluated. Consider MRI abdomen with/ without multihance contrast for definitive characterization, as clinically warranted. Bilateral adrenal nodules measuring up to 1.6 cm on the right, likely reflecting benign adrenal adenomas, although technically indeterminate. If MRI abdomen is performed, these can be definitively characterized at that time.  On 07/19/16 the patient consulted with Dr. Leighton Ruff of [facility]. Per her note, she recommends complete metastatic workup with MRI to evaluate liver, adrenal gland, and rectum. The patient will follow up with Dr. Marcello Moores following neoadjuvant therapy to discuss surgical resection.  The patient reports she has lost 30 lbs since January. She reports ongoing fatigue, which her PCP related to iron deficiency and vitamin D deficiency. She reports occasional incontinence, and irregular bowel activity. She reports ongoing bright red rectal bleeding. She denies cramps or pain. She takes 2 iron pills daily.  CURRENT THERAPY: Surveillance   INTERVAL HISTORY:  Tonya [name] is a 67 y.o. [name] who is here for a follow-up. She presents to the clinic today by herself. She complains of stool incontinence that is very bad and affecting her daily life and activities. She says that she will have to stop eating for 2 days before leaving the house. The incontinence is constant and is mushy in consistency, she states that she doesn't feel the urgency to use the toilet. She states that it comes with everything she eats. She gets up almost every half an hour at night and she is not sleeping well and it is making her irritable. She rejects a colostomy bag because it is very messy. She has seen her surgeon multiple times, who recommended that the patient stays patient.   She also states that her lower limbs are  swollen. She tried compression stocking, but they hurt her.   She feels irritable and anhedonic. She denies  suicidal ideation. She says that she cannot leave the house, but can perform daily activities at home.  MEDICAL HISTORY:  Past Medical History:  Diagnosis Date  . Coronary artery disease 07/2003  . Myocardial infarction ([place])   . Obesity 2003   s/p gastric bypass     SURGICAL HISTORY: Past Surgical History:  Procedure Laterality Date  . CHOLECYSTECTOMY  1999  . GASTRIC BYPASS  2003  . IR FLUORO GUIDE PORT INSERTION RIGHT  01/25/2017  . IR US GUIDE VASC ACCESS RIGHT  01/25/2017    SOCIAL HISTORY: Social History   Socioeconomic History  . Marital status: Single    Spouse name: Not on file  . Number of children: 0  . Years of education: Not on file  . Highest education level: Not on file  Occupational History  . Occupation: retired  Scientific laboratory technician  . Financial resource strain: Not on file  . Food insecurity:    Worry: Not on file    Inability: Not on file  . Transportation needs:    Medical: Not on file    Non-medical: Not on file  Tobacco Use  . Smoking status: Never Smoker  . Smokeless tobacco: Never Used  Substance and Sexual Activity  . Alcohol use: No  . Drug use: No  . Sexual activity: Not on file  Lifestyle  . Physical activity:    Days per week: Not on file    Minutes per session: Not on file  . Stress: Not on file  Relationships  . Social connections:    Talks on phone: Not on file    Gets together: Not on file    Attends religious service: Not on file    Active member of club or organization: Not on file    Attends meetings of clubs or organizations: Not on file    Relationship status: Not on file  . Intimate partner violence:    Fear of current or ex partner: Not on file    Emotionally abused: Not on file    Physically abused: Not on file    Forced sexual activity: Not on file  Other Topics Concern  . Not on file  Social History Narrative  . Not on file    FAMILY HISTORY: Family History  Problem Relation Age of Onset  . Heart attack Mother         d.93  . Throat cancer Mother 42  . Clotting disorder Mother   . Liver disease Mother   . Kidney disease Mother   . Heart attack Father        d.92  . Prostate cancer Father 75  . Uterine cancer Sister 20       treated with total hysterectomy and radiation  . Thyroid cancer Sister 1       treated with thyroidectomy  . Cancer Maternal Uncle        d.80s unspecified type of cancer. History of smoking.  Marland Kitchen Uterine cancer Sister 46  . Colon cancer Cousin 82       paternal first-cousin    ALLERGIES:  is allergic to bactrim [sulfamethoxazole-trimethoprim]; phenergan [promethazine hcl]; sulfa antibiotics; and penicillins.  MEDICATIONS:  Current Outpatient Medications  Medication Sig Dispense Refill  . aspirin 81 MG chewable tablet Chew 81 mg by mouth daily.    . Cholecalciferol (VITAMIN D3) 2000 units capsule Take 1 capsule by  mouth daily.    . codeine 15 MG tablet Take 15 mg by mouth 2 (two) times daily.    . diphenoxylate-atropine (LOMOTIL) 2.5-0.025 MG tablet TAKE 1 TO 2 TABLETS BY MOUTH FOUR TIMES DAILY AS NEEDED 120 tablet 3  . ferrous sulfate (FERROUSUL) 325 (65 FE) MG tablet Take 1 tablet (325 mg total) by mouth daily with breakfast. (Patient taking differently: Take 325 mg by mouth. Take two by mouth daily) 30 tablet 6  . ibuprofen (ADVIL,MOTRIN) 200 MG tablet Take 200 mg by mouth every 6 (six) hours as needed.    . Multiple Vitamin (MULTIVITAMIN) tablet Take 1 tablet by mouth daily.    . urea (CARMOL) 10 % cream Apply topically as needed. (Patient not taking: Reported on 06/29/2017) 71 g 1   Current Facility-Administered Medications  Medication Dose Route Frequency Provider Last Rate Last Dose  . 0.9 %  sodium chloride infusion  500 mL Intravenous Continuous Irene Shipper, MD       Facility-Administered Medications Ordered in Other Visits  Medication Dose Route Frequency Provider Last Rate Last Dose  . Tbo-Filgrastim (GRANIX) injection 480 mcg  480 mcg Subcutaneous Once  Truitt Merle, MD        REVIEW OF SYSTEMS:  Constitutional: Denies fevers, chills or abnormal night sweats, steady weight (+) lack of sleep Eyes: Denies blurriness of vision, double vision or watery eyes Ears, nose, mouth, throat, and face: Denies mucositis or sore throat Respiratory: Denies cough, dyspnea or wheezes Cardiovascular: Denies palpitation, chest discomfort (+) lower extremity swelling Gastrointestinal:  Denies nausea, heartburn (+) stool incontinence  (+) ileostomy bag Skin: Denies abnormal skin rashes  Lymphatics: Denies new lymphadenopathy or easy bruising Neurological:Denies numbness, tingling or new weaknesses  Behavioral/Psych: (+) irritability, anger, and depressed  All other systems were reviewed with the patient and are negative.  PHYSICAL EXAMINATION:  ECOG PERFORMANCE STATUS: 1 - Symptomatic but completely ambulatory Vitals:   08/29/17 1040  BP: 117/73  Pulse: 60  Resp: 17  Temp: 98.1 F (36.7 C)  TempSrc: Oral  SpO2: 100%  Weight: 129 lb 3.2 oz (58.6 kg)  Height: 5' (1.524 m)     GENERAL:alert, no distress and comfortable SKIN: skin color, texture, turgor are normal, no rashes or significant lesions, Mild erythema parienal area, no skin breakdown or discharge  EYES: normal, conjunctiva are pink and non-injected, sclera clear OROPHARYNX:no exudate, no erythema and lips, buccal mucosa, and tongue normal NECK: supple, thyroid normal size, non-tender, without nodularity LYMPH:  no palpable lymphadenopathy in the cervical, axillary or inguinal LUNGS: clear to auscultation and percussion with normal breathing effort HEART: regular rate & rhythm and no murmurs and (+) lower extremity edema ABDOMEN:abdomen soft, non-tender, and normal bowel sounds. No organomegaly. Low abdomen (+) surgical incision healing well  Musculoskeletal:no cyanosis of digits and no clubbing PSYCH: alert & oriented x 3 with fluent speech NEURO: no focal motor/sensory deficits Rectal  Exam: Deferred today   LABORATORY DATA:  I have reviewed the data as listed. CBC Latest Ref Rng & Units 08/29/2017 08/16/2017 06/29/2017  WBC 3.9 - 10.3 K/uL 5.8 6.1 9.2  Hemoglobin 11.6 - 15.9 g/dL 10.0(L) 10.4(L) 9.9(L)  Hematocrit 34.8 - 46.6 % 31.5(L) 33.0(L) 30.5(L)  Platelets 145 - 400 K/uL 219 230 258   CMP Latest Ref Rng & Units 08/16/2017 06/29/2017 05/04/2017  Glucose 70 - 99 mg/dL 83 94 100  BUN 8 - 23 mg/dL '18 17 16  '$ Creatinine 0.44 - 1.00 mg/dL 0.66 0.59 0.62  Sodium  135 - 145 mmol/L 141 140 139  Potassium 3.5 - 5.1 mmol/L 3.9 3.9 4.0  Chloride 98 - 111 mmol/L 107 110 111(H)  CO2 22 - 32 mmol/L 24 24 21(L)  Calcium 8.9 - 10.3 mg/dL 8.6(L) 9.2 8.9  Total Protein 6.5 - 8.1 g/dL 6.4(L) 6.1(L) 5.9(L)  Total Bilirubin 0.3 - 1.2 mg/dL 0.3 0.2(L) 0.3  Alkaline Phos 38 - 126 U/L 123 122 113  AST 15 - 41 U/L 29 29 41(H)  ALT 0 - 44 U/L 29 25 33    PATHOLOGY:  FINAL PATHOLOGIC DIAGNOSIS 12/06/16 MICROSCOPIC EXAMINATION AND DIAGNOSIS  A.ANAL CANAL, MUCOSECTOMY: Benign rectal and fibroadipose tissue. Negative for malignancy.  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 Mucinous tumor focally invades through the muscularis propria into pericolorectal tissue. Radial margin interpreted as involved by invasive carcinoma (invasive acellular mucin with rare viable tumor cells focally comes to within 0.2 mm [0.02 cm] of the inked radial soft tissue margin). Treatment effect present, predominantly invasive acellular mucin associated with only very rare small groups of viable tumor cells (near complete response). One of twelve (1/12) lymph nodes positive for metastatic mucinous carcinoma (one additional lymph node also displays only acellular  mucin, not counted as a positive lymph node). AJCC Pathologic Stage: ypT3 pN1a. See Cancer Case Summary.  C.POSTERIOR ANAL CANAL, EXCISION: Benign rectal tissue. Negative for malignancy.    07/09/2016: Diagnosis Surgical [P], rectum mass - INVASIVE ADENOCARCINOMA.  RADIOGRAPHIC STUDIES: I have personally reviewed the radiological images as listed and agreed with the findings in the report.  CT CAP W Contrast 04/04/17 IMPRESSION: New complete interval resolution of bulky rectal soft tissue mass since prior exam. No evidence of local or distant metastatic disease.  No results found.  ASSESSMENT & PLAN:  Tonya [name] is a 67 y.o. woman with past medical history of gastric bypass surgery, presented with rectal bleeding, loose bowel movement and stool incontinence for one half years, was recently diagnosed invasive rectal adenocarcinoma.  1. Invasive low rectal adenocarcinoma, cT3N0M0, ypT3N1a, near complete response, (+) margin, MMR normal  -I reviewed his CT scan findings, and surgical pathology results in great details with patient and her friend.  -I discussed her abdominal and pelvic MRI scan findings, which showed a T3 N0 rectal tumor. The liver lesion is indeterminate, low possibility of metastasis. We'll continue observation, and repeat scan in 3-6 months. -Patient agrees to proceed with concurrent chemotherapy and radiation. She completed on 09/16/16. Patient has tolerated concurrent chemotherapy and radiation very well., other than rectal pain.  -She underwent surgery on 12/06/16 with Dr. Drue Flirt at [facility]. The surgical path showed near complete response to neoadjuvant chemoradiation, but it was still a T3 lesion, and unfortunately the radial margin was positive and one out of 12 nodes was positive. She is stage III and moderate risk of distant recurrence and high risk of local recurrence   -I recommend adjuvant chemotherapy with CAPOX  every 3 weeks or FOLFOX every 2 weeks for at least 2-3 months. I previously discussed the side effects in great detail. I gave reading material on these medications. Although I think she may tolerate FOLFOX better, she is very stressed about the pump, and see frequent visit for this treatment regimen.  After lengthy discussion, we decided to try Xeloda and oxaliplatin first.  I plan to reduce her oxaliplatin dose for the first cycle to see if she is able to  tolerate. Side effects were discussed with patient in great detail. She agrees to proceed. -She completed 4 months adjuvant chemotherapy FOLFOX in early May 2019  -Pt previously appeared to be very depressed and devastated by her slow recovery, colostomy bag etc, and it has gotten worse now due to her stool incontinence after colostomy reversal surgery. --Restaging CT from 04/04/17 was reviewed with her, which shows complete interval resolution of bulky rectal soft tissue mass and no evidence of local or distant metastatic disease. - She is complaining of worsening stool incontinence that is severely affecting and limiting her activities and sleep.  She has to avoid any food for 2 days if she needs go out for MD appointment etc. I advised her to talk to her surgeon, and I will discuss with our local colorectal surgeon too  -She is otherwise clinically doing well, labs reviewed, exam was unremarkable, no clinical concern for recurrence. -Continue colon cancer surveillance, will see her back in 2 months, next surveillance CT scan in October 2019.  I risk of recurrence, will repeat a surveillance CT scan every 6 months for 2-3 years  -f/u in 3 month  2. Iron Deficient Anemia  - The patient takes 2 iron supplements daily. - She will stop taking aspirin to reduce her bleeding risk. -Repeat iron studies showed iron deficiency, she received 1 dose IV iron Feraheme in the past -Hg at 10.0 and iron panel was pending  3. Genetics  -Patient has 2 sisters who  had uterine cancer, this is suspicious for lynch syndrome. -I encouraged the patient to pursue genetic testing based on her family history. This will determine if the patient has inheritable genetic syndrome, such as Lynch syndrome -The patient was seen by genetic counselor on July 29, 2016, but declined genetic testing.   4. History of gastric bypass surgery in 2003 -for obesity   5. History of DM and HTN -Resolved after her gastric bypass surgery. -We'll monitor her blood pressure and glucose closely during her chemotherapy treatment. -Her BP at 117/73 today   6. Diarrhea and complete stool incontinence -She has had a severe diarrhea and stool incontinence since her ileostomy reversal surgery -I advised her to try lomotil up to 6-8 times a day as needed. I refilled for her today  -I suggested her to use imodium in conjunction with lomotil to help with diarrhea. She can take OTC medication for gas -She has completed stool incontinence, which is devastating and severely impacts her life and daily activity  -She refuses to have a colostomy bag again   7. Depression -I previously discussed as this is a hard time for her post surgery she can talk to someone at our clinic. I recommend her to see our Education officer, museum for counseling, she agreed.  -I prescribed Mirtazapine on (12/22/16) as this can also help her appetite and sleep, she can start at half dose to see if she can tolerate.  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 No functional or sensory deficit. Will monitor closely. -overall improved     PLAN: -She is scheduled for surveillance CT scan on August 16, I will call her after the scan to discuss her results, or sooner if needed  -f/u in 3 months with port flush, flush in 6 weeks -Discussed with her colorectal surgeon  about her severe stool incontinence  No orders of the defined types were placed in this encounter.  All questions were answered. The patient knows to call the clinic with any problems, questions or concerns.  I spent 20 minutes counseling the patient face to face. The total time spent in the appointment was 25 minutes and more than 50% was on counseling.  Dierdre Searles Dweik am acting as scribe for Dr. Truitt Merle.  I have reviewed the above documentation for accuracy and completeness, and I agree with the above.      Truitt Merle, MD 08/29/2017 11:14 AM

## 2017-08-30 ENCOUNTER — Encounter: Payer: Self-pay | Admitting: Hematology

## 2017-08-30 ENCOUNTER — Telehealth: Payer: Self-pay

## 2017-08-30 NOTE — Telephone Encounter (Signed)
-----   Message from Truitt Merle, MD sent at 08/30/2017  2:04 PM EDT ----- Please let pt know the lab result, iron level still slightly low, please increase her iron supplement, thanks   Truitt Merle  08/30/2017

## 2017-08-30 NOTE — Telephone Encounter (Signed)
Left voice message for patient regarding lab results, per Dr. Jacklynn Bue iron level is still slightly low, instructed to increase her iron supplement to twice daily, watch for constipation.

## 2017-08-31 ENCOUNTER — Telehealth: Payer: Self-pay | Admitting: Hematology

## 2017-08-31 NOTE — Telephone Encounter (Signed)
MVM for patient with date/time for labs & PF appts prior to CT scan on 10/16/ per 8/27 phone message

## 2017-09-07 ENCOUNTER — Telehealth: Payer: Self-pay | Admitting: Hematology

## 2017-09-07 NOTE — Telephone Encounter (Signed)
Returned call to patient re confirming appointments. Per patient cxd 9/16 lab/port - per patient has lab/port last week and the next one should be the one scheduled for 10/16. Cancelled 9/16 and confirmed next appointment on schedule for 10/16.

## 2017-09-19 ENCOUNTER — Other Ambulatory Visit: Payer: Medicare HMO

## 2017-09-27 DIAGNOSIS — R69 Illness, unspecified: Secondary | ICD-10-CM | POA: Diagnosis not present

## 2017-10-17 ENCOUNTER — Telehealth: Payer: Self-pay | Admitting: Hematology

## 2017-10-17 NOTE — Telephone Encounter (Signed)
Faxed medical records to [facility], Release ID: [id number]

## 2017-10-19 ENCOUNTER — Other Ambulatory Visit: Payer: Self-pay | Admitting: *Deleted

## 2017-10-19 ENCOUNTER — Inpatient Hospital Stay: Payer: Medicare HMO | Attending: Hematology

## 2017-10-19 ENCOUNTER — Ambulatory Visit (HOSPITAL_COMMUNITY)
Admission: RE | Admit: 2017-10-19 | Discharge: 2017-10-19 | Disposition: A | Discharge status: Home / Self Care | Payer: Medicare HMO | Source: Ambulatory Visit | Attending: Nurse Practitioner | Admitting: Nurse Practitioner

## 2017-10-19 ENCOUNTER — Other Ambulatory Visit: Payer: Self-pay | Admitting: Nurse Practitioner

## 2017-10-19 ENCOUNTER — Inpatient Hospital Stay: Payer: Medicare HMO

## 2017-10-19 DIAGNOSIS — C20 Malignant neoplasm of rectum: Secondary | ICD-10-CM | POA: Insufficient documentation

## 2017-10-19 DIAGNOSIS — C218 Malignant neoplasm of overlapping sites of rectum, anus and anal canal: Secondary | ICD-10-CM | POA: Diagnosis not present

## 2017-10-19 DIAGNOSIS — D500 Iron deficiency anemia secondary to blood loss (chronic): Secondary | ICD-10-CM

## 2017-10-19 LAB — CMP (CANCER CENTER ONLY)
ALT: 27 U/L (ref 0–44)
AST: 25 U/L (ref 15–41)
Albumin: 3.5 g/dL (ref 3.5–5.0)
Alkaline Phosphatase: 109 U/L (ref 38–126)
Anion gap: 8 (ref 5–15)
BILIRUBIN TOTAL: 0.4 mg/dL (ref 0.3–1.2)
BUN: 17 mg/dL (ref 8–23)
CALCIUM: 9 mg/dL (ref 8.9–10.3)
CHLORIDE: 110 mmol/L (ref 98–111)
CO2: 23 mmol/L (ref 22–32)
CREATININE: 0.6 mg/dL (ref 0.44–1.00)
Glucose, Bld: 82 mg/dL (ref 70–99)
Potassium: 4.5 mmol/L (ref 3.5–5.1)
Sodium: 141 mmol/L (ref 135–145)
Total Protein: 6.3 g/dL — ABNORMAL LOW (ref 6.5–8.1)

## 2017-10-19 LAB — CBC WITH DIFFERENTIAL (CANCER CENTER ONLY)
ABS IMMATURE GRANULOCYTES: 0.03 10*3/uL (ref 0.00–0.07)
BASOS PCT: 0 %
Basophils Absolute: 0 10*3/uL (ref 0.0–0.1)
Eosinophils Absolute: 0 10*3/uL (ref 0.0–0.5)
Eosinophils Relative: 1 %
HCT: 33.1 % — ABNORMAL LOW (ref 36.0–46.0)
Hemoglobin: 10.7 g/dL — ABNORMAL LOW (ref 12.0–15.0)
Immature Granulocytes: 1 %
Lymphocytes Relative: 10 %
Lymphs Abs: 0.6 10*3/uL — ABNORMAL LOW (ref 0.7–4.0)
MCH: 32 pg (ref 26.0–34.0)
MCHC: 32.3 g/dL (ref 30.0–36.0)
MCV: 99.1 fL (ref 80.0–100.0)
Monocytes Absolute: 0.5 10*3/uL (ref 0.1–1.0)
Monocytes Relative: 9 %
NEUTROS ABS: 4.6 10*3/uL (ref 1.7–7.7)
Neutrophils Relative %: 79 %
PLATELETS: 222 10*3/uL (ref 150–400)
RBC: 3.34 MIL/uL — AB (ref 3.87–5.11)
RDW: 12.6 % (ref 11.5–15.5)
WBC Count: 5.8 10*3/uL (ref 4.0–10.5)
nRBC: 0 % (ref 0.0–0.2)

## 2017-10-19 LAB — RETICULOCYTES
IMMATURE RETIC FRACT: 14.6 % (ref 2.3–15.9)
RBC.: 3.34 MIL/uL — ABNORMAL LOW (ref 3.87–5.11)
RETIC CT PCT: 2.3 % (ref 0.4–3.1)
Retic Count, Absolute: 77.8 10*3/uL (ref 19.0–186.0)

## 2017-10-19 LAB — FERRITIN: FERRITIN: 126 ng/mL (ref 11–307)

## 2017-10-19 LAB — IRON AND TIBC
Iron: 123 ug/dL (ref 41–142)
Saturation Ratios: 45 % (ref 21–57)
TIBC: 274 ug/dL (ref 236–444)
UIBC: 151 ug/dL

## 2017-10-19 LAB — CEA (IN HOUSE-CHCC): CEA (CHCC-In House): 2.24 ng/mL (ref 0.00–5.00)

## 2017-10-19 MED ORDER — SODIUM CHLORIDE 0.9 % IJ SOLN
INTRAMUSCULAR | Status: AC
  Filled 2017-10-19: qty 50

## 2017-10-19 MED ORDER — HEPARIN SOD (PORK) LOCK FLUSH 100 UNIT/ML IV SOLN
500.0000 [IU] | Freq: Once | INTRAVENOUS | Status: AC
  Administered 2017-10-19: 500 [IU] via INTRAVENOUS

## 2017-10-19 MED ORDER — IOHEXOL 300 MG/ML  SOLN
100.0000 mL | Freq: Once | INTRAMUSCULAR | Status: AC | PRN
  Administered 2017-10-19: 100 mL via INTRAVENOUS

## 2017-10-19 MED ORDER — HEPARIN SOD (PORK) LOCK FLUSH 100 UNIT/ML IV SOLN
INTRAVENOUS | Status: AC
  Filled 2017-10-19: qty 5

## 2017-10-19 NOTE — Patient Instructions (Signed)
Implanted Port Home Guide An implanted port is a type of central line that is placed under the skin. Central lines are used to provide IV access when treatment or nutrition needs to be given through a person's veins. Implanted ports are used for long-term IV access. An implanted port may be placed because:  You need IV medicine that would be irritating to the small veins in your hands or arms.  You need long-term IV medicines, such as antibiotics.  You need IV nutrition for a long period.  You need frequent blood draws for lab tests.  You need dialysis.  Implanted ports are usually placed in the chest area, but they can also be placed in the upper arm, the abdomen, or the leg. An implanted port has two main parts:  Reservoir. The reservoir is round and will appear as a small, raised area under your skin. The reservoir is the part where a needle is inserted to give medicines or draw blood.  Catheter. The catheter is a thin, flexible tube that extends from the reservoir. The catheter is placed into a large vein. Medicine that is inserted into the reservoir goes into the catheter and then into the vein.  How will I care for my incision site? Do not get the incision site wet. Bathe or shower as directed by your health care provider. How is my port accessed? Special steps must be taken to access the port:  Before the port is accessed, a numbing cream can be placed on the skin. This helps numb the skin over the port site.  Your health care provider uses a sterile technique to access the port. ? Your health care provider must put on a mask and sterile gloves. ? The skin over your port is cleaned carefully with an antiseptic and allowed to dry. ? The port is gently pinched between sterile gloves, and a needle is inserted into the port.  Only "non-coring" port needles should be used to access the port. Once the port is accessed, a blood return should be checked. This helps ensure that the port  is in the vein and is not clogged.  If your port needs to remain accessed for a constant infusion, a clear (transparent) bandage will be placed over the needle site. The bandage and needle will need to be changed every week, or as directed by your health care provider.  Keep the bandage covering the needle clean and dry. Do not get it wet. Follow your health care provider's instructions on how to take a shower or bath while the port is accessed.  If your port does not need to stay accessed, no bandage is needed over the port.  What is flushing? Flushing helps keep the port from getting clogged. Follow your health care provider's instructions on how and when to flush the port. Ports are usually flushed with saline solution or a medicine called heparin. The need for flushing will depend on how the port is used.  If the port is used for intermittent medicines or blood draws, the port will need to be flushed: ? After medicines have been given. ? After blood has been drawn. ? As part of routine maintenance.  If a constant infusion is running, the port may not need to be flushed.  How long will my port stay implanted? The port can stay in for as long as your health care provider thinks it is needed. When it is time for the port to come out, surgery will be   done to remove it. The procedure is similar to the one performed when the port was put in. When should I seek immediate medical care? When you have an implanted port, you should seek immediate medical care if:  You notice a bad smell coming from the incision site.  You have swelling, redness, or drainage at the incision site.  You have more swelling or pain at the port site or the surrounding area.  You have a fever that is not controlled with medicine.  This information is not intended to replace advice given to you by your health care provider. Make sure you discuss any questions you have with your health care provider. Document  Released: 12/21/2004 Document Revised: 05/29/2015 Document Reviewed: 08/28/2012 Elsevier Interactive Patient Education  2017 [employer]vier Inc.  

## 2017-10-21 ENCOUNTER — Telehealth: Payer: Self-pay | Admitting: Hematology

## 2017-10-21 ENCOUNTER — Telehealth: Payer: Self-pay

## 2017-10-21 NOTE — Telephone Encounter (Signed)
Appt scheduled LMVM for patient with date/time per 10/18 sch msg

## 2017-10-21 NOTE — Telephone Encounter (Signed)
Please offer her a f/u appointment with Lacie at 8:30am on 11/1 after port flush, or with me on a different day (reschedyle her port flush). If she declines again, please schedule her port flush every 6 weeks for the next 6 months. Thanks   Truitt Merle MD

## 2017-10-21 NOTE — Telephone Encounter (Signed)
Spoke with Tonya [name] with CT scan results which did not show recurrence of cancer.  Tonya [name] stated she is very disappointed "was hoping that you would tell me it was back".  I asked Tonya [name] if she was going to harm herself and she said "no".  I offered her to come in for appointment of Monday 10/21 and she stated "for what, to tell me I need to live". She declined appointment, does want her port removed.  She plans to not return to the [facility].  Dr. Burr Medico and Cira Rue NP made aware.  Sent SW a message to do a wellness check on this Tonya [name] next week.

## 2017-10-21 NOTE — Telephone Encounter (Signed)
Patient calls back stating she would like to keep the port after all.  Will send scheduling a message for a port flush.  Desires first appointment of the day.

## 2017-10-24 ENCOUNTER — Encounter: Payer: Self-pay | Admitting: *Deleted

## 2017-10-24 NOTE — Progress Notes (Signed)
[place] Work  Clinical Social Work received referral from Therapist, sports for [employer] check" and emotional support.  CSW contacted patient at home to offer support and assess for needs.  Patient stated "she was not doing well, but she would just have to learn to live with it."  Patient expressed anger and frustration for not being able to leave the house or socialize due to side effects of diagnosis and treatment.  CSW validated patients feelings and offered support.  Patient refused any support at this time.  Patient stated "no one can fix it, and I don't want to talk about it."  Patient was not open to support in the home or resources outside of the home.  Patient did state she planned to keep her port and would continue to attend her doctor appointments.  Patient was agreeable to CSW contacting her to "check in" periodically.    Johnnye Lana, MSW, LCSW, OSW-C Clinical Social Worker [facility] [phone]

## 2017-11-04 ENCOUNTER — Telehealth: Payer: Self-pay

## 2017-11-04 ENCOUNTER — Inpatient Hospital Stay: Payer: Medicare HMO | Attending: Hematology

## 2017-11-04 DIAGNOSIS — Z452 Encounter for adjustment and management of vascular access device: Secondary | ICD-10-CM | POA: Insufficient documentation

## 2017-11-04 DIAGNOSIS — C9000 Multiple myeloma not having achieved remission: Secondary | ICD-10-CM

## 2017-11-04 DIAGNOSIS — C20 Malignant neoplasm of rectum: Secondary | ICD-10-CM | POA: Diagnosis present

## 2017-11-04 MED ORDER — HEPARIN SOD (PORK) LOCK FLUSH 100 UNIT/ML IV SOLN
500.0000 [IU] | Freq: Once | INTRAVENOUS | Status: AC
  Administered 2017-11-04: 500 [IU] via INTRAVENOUS
  Filled 2017-11-04: qty 5

## 2017-11-04 MED ORDER — SODIUM CHLORIDE 0.9% FLUSH
10.0000 mL | Freq: Once | INTRAVENOUS | Status: AC
  Administered 2017-11-04: 10 mL via INTRAVENOUS
  Filled 2017-11-04: qty 10

## 2017-11-04 NOTE — Patient Instructions (Signed)
Implanted Port Home Guide An implanted port is a type of central line that is placed under the skin. Central lines are used to provide IV access when treatment or nutrition needs to be given through a person's veins. Implanted ports are used for long-term IV access. An implanted port may be placed because:  You need IV medicine that would be irritating to the small veins in your hands or arms.  You need long-term IV medicines, such as antibiotics.  You need IV nutrition for a long period.  You need frequent blood draws for lab tests.  You need dialysis.  Implanted ports are usually placed in the chest area, but they can also be placed in the upper arm, the abdomen, or the leg. An implanted port has two main parts:  Reservoir. The reservoir is round and will appear as a small, raised area under your skin. The reservoir is the part where a needle is inserted to give medicines or draw blood.  Catheter. The catheter is a thin, flexible tube that extends from the reservoir. The catheter is placed into a large vein. Medicine that is inserted into the reservoir goes into the catheter and then into the vein.  How will I care for my incision site? Do not get the incision site wet. Bathe or shower as directed by your health care provider. How is my port accessed? Special steps must be taken to access the port:  Before the port is accessed, a numbing cream can be placed on the skin. This helps numb the skin over the port site.  Your health care provider uses a sterile technique to access the port. ? Your health care provider must put on a mask and sterile gloves. ? The skin over your port is cleaned carefully with an antiseptic and allowed to dry. ? The port is gently pinched between sterile gloves, and a needle is inserted into the port.  Only "non-coring" port needles should be used to access the port. Once the port is accessed, a blood return should be checked. This helps ensure that the port  is in the vein and is not clogged.  If your port needs to remain accessed for a constant infusion, a clear (transparent) bandage will be placed over the needle site. The bandage and needle will need to be changed every week, or as directed by your health care provider.  Keep the bandage covering the needle clean and dry. Do not get it wet. Follow your health care provider's instructions on how to take a shower or bath while the port is accessed.  If your port does not need to stay accessed, no bandage is needed over the port.  What is flushing? Flushing helps keep the port from getting clogged. Follow your health care provider's instructions on how and when to flush the port. Ports are usually flushed with saline solution or a medicine called heparin. The need for flushing will depend on how the port is used.  If the port is used for intermittent medicines or blood draws, the port will need to be flushed: ? After medicines have been given. ? After blood has been drawn. ? As part of routine maintenance.  If a constant infusion is running, the port may not need to be flushed.  How long will my port stay implanted? The port can stay in for as long as your health care provider thinks it is needed. When it is time for the port to come out, surgery will be   done to remove it. The procedure is similar to the one performed when the port was put in. When should I seek immediate medical care? When you have an implanted port, you should seek immediate medical care if:  You notice a bad smell coming from the incision site.  You have swelling, redness, or drainage at the incision site.  You have more swelling or pain at the port site or the surrounding area.  You have a fever that is not controlled with medicine.  This information is not intended to replace advice given to you by your health care provider. Make sure you discuss any questions you have with your health care provider. Document  Released: 12/21/2004 Document Revised: 05/29/2015 Document Reviewed: 08/28/2012 Elsevier Interactive Patient Education  2017 [employer]vier Inc.  

## 2017-11-04 NOTE — Telephone Encounter (Signed)
Per 11/1 patient requested after her flush today to schedule flush appointments. Printed calender of these  appointments.

## 2017-11-07 DIAGNOSIS — M6281 Muscle weakness (generalized): Secondary | ICD-10-CM | POA: Diagnosis not present

## 2017-11-07 DIAGNOSIS — R195 Other fecal abnormalities: Secondary | ICD-10-CM | POA: Diagnosis not present

## 2017-11-07 DIAGNOSIS — R102 Pelvic and perineal pain: Secondary | ICD-10-CM | POA: Diagnosis not present

## 2017-11-07 DIAGNOSIS — M62838 Other muscle spasm: Secondary | ICD-10-CM | POA: Diagnosis not present

## 2017-11-10 DIAGNOSIS — M6281 Muscle weakness (generalized): Secondary | ICD-10-CM | POA: Diagnosis not present

## 2017-11-10 DIAGNOSIS — R102 Pelvic and perineal pain: Secondary | ICD-10-CM | POA: Diagnosis not present

## 2017-11-10 DIAGNOSIS — R195 Other fecal abnormalities: Secondary | ICD-10-CM | POA: Diagnosis not present

## 2017-11-10 DIAGNOSIS — M62838 Other muscle spasm: Secondary | ICD-10-CM | POA: Diagnosis not present

## 2017-11-23 DIAGNOSIS — R195 Other fecal abnormalities: Secondary | ICD-10-CM | POA: Diagnosis not present

## 2017-11-23 DIAGNOSIS — M62838 Other muscle spasm: Secondary | ICD-10-CM | POA: Diagnosis not present

## 2017-11-23 DIAGNOSIS — M6281 Muscle weakness (generalized): Secondary | ICD-10-CM | POA: Diagnosis not present

## 2017-11-23 DIAGNOSIS — R102 Pelvic and perineal pain: Secondary | ICD-10-CM | POA: Diagnosis not present

## 2017-11-25 ENCOUNTER — Inpatient Hospital Stay: Payer: Medicare HMO | Admitting: Hematology

## 2017-11-25 DIAGNOSIS — R69 Illness, unspecified: Secondary | ICD-10-CM | POA: Diagnosis not present

## 2017-11-25 DIAGNOSIS — R159 Full incontinence of feces: Secondary | ICD-10-CM | POA: Diagnosis not present

## 2017-11-25 DIAGNOSIS — N952 Postmenopausal atrophic vaginitis: Secondary | ICD-10-CM | POA: Diagnosis not present

## 2017-11-25 DIAGNOSIS — R194 Change in bowel habit: Secondary | ICD-10-CM | POA: Diagnosis not present

## 2017-11-25 DIAGNOSIS — R195 Other fecal abnormalities: Secondary | ICD-10-CM | POA: Diagnosis not present

## 2017-11-25 DIAGNOSIS — Z79899 Other long term (current) drug therapy: Secondary | ICD-10-CM | POA: Diagnosis not present

## 2017-11-29 ENCOUNTER — Telehealth: Payer: Self-pay | Admitting: Hematology

## 2017-11-29 NOTE — Telephone Encounter (Signed)
Called patient and left message- for patient to call back per 11/26 sch message to r/s missed appt.

## 2017-12-04 HISTORY — PX: OTHER SURGICAL HISTORY: SHX169

## 2017-12-04 NOTE — Progress Notes (Signed)
[place]  [phone] [phone]  Clinic Follow up Note   Patient Care Team: Lucille Passy, MD as PCP - General (Family Medicine) Irene Shipper, MD as Consulting Physician (Gastroenterology) Leighton Ruff, MD as Consulting Physician (General Surgery) Truitt Merle, MD as Consulting Physician (Hematology) Kyung Rudd, MD as Consulting Physician (Radiation Oncology) 12/05/2017   Chief Complaint: F/u on colon cancer  SUMMARY OF ONCOLOGIC HISTORY: Oncology History   Cancer Staging Rectal adenocarcinoma [facility]) Staging form: Colon and Rectum, AJCC 8th Edition - Clinical stage from 07/09/2016: Stage IIA (cT3, cN0, cM0) - Signed by Truitt Merle, MD on 08/16/2016       Rectal adenocarcinoma ([place])   07/09/2016 Pathology Results    Diagnosis Surgical [P], rectum mass - INVASIVE ADENOCARCINOMA. - SEE COMMENT.    07/09/2016 Procedure    Colonoscopy A non-obstructing large friable mass was found in the distal rectum. This began at approximately 1 cm above the anal verge and extended proximal for a distance of 8-10 cm. The lesion was bulky and involved approximately 75% of the luminal circumference. Multiple biopsies were taken. A few small-mouthed diverticula were found in the left colon. The exam was otherwise without abnormality on direct views. Retroflexion not performed purposely.    07/12/2016 Imaging    CT C/A/P with contrast IMPRESSION: Rectal wall thickening/mass, compatible with known rectal adenocarcinoma. No findings specific for metastatic disease in the chest, abdomen, or pelvis. 2.2 cm probable hemangioma in segment 4B, although poorly evaluated. Consider MRI abdomen with/ without multihance contrast for definitive characterization, as clinically warranted. Bilateral adrenal nodules measuring up to 1.6 cm on the right, likely reflecting benign adrenal adenomas, although technically indeterminate. If MRI abdomen is performed, these can be definitively  characterized at that time.    07/20/2016 Initial Diagnosis    Rectal adenocarcinoma ([place])    08/03/2016 Imaging    MRI AP W WO Contrast 08/03/16 IMPRESSION: 1. Large rectal mass measures 6.1 cm in length. No obstruction identified. This is compatible with at least a T3bN0M0 lesion. 2. No specific features highly suspicious for nodal metastasis or metastatic disease to the upper abdomen. 3. Indeterminate arterial phase enhancing lesion within the anterior dome of liver measures less than 1 cm. This may represent a benign liver lesions such as FNH or adenoma. Less favored with the hypervascular liver metastasis. Followup imaging at 6 months with repeat MRI of the liver is advise. 4. Bilateral adrenal adenomas    08/04/2016 - 09/16/2016 Radiation Therapy    Concurrent chemo radiation with Dr. [name]    08/04/2016 - 09/16/2016 Chemotherapy    Concurrent chemo radiation with Xeloda, '1500mg'$  twice daily    12/06/2016 Surgery    ULTRA LOW ANTERIOR RESECTION OF SIGMOID COLON AND RECTUM WITH COLOANAL ANASTAMOSIS AND LOOP ILEOSTOMY  by Dr. Drue Flirt at [facility]  12/06/16    12/06/2016 Pathology Results    FINAL PATHOLOGIC DIAGNOSIS 12/06/16 MICROSCOPIC EXAMINATION AND DIAGNOSIS  A.ANAL CANAL, MUCOSECTOMY: Benign rectal and fibroadipose tissue. Negative for malignancy.  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 Mucinous tumor focally invades through the muscularis propria into pericolorectal tissue. Radial margin interpreted as involved by invasive carcinoma (invasive acellular mucin withrare viable tumor cells focally comes to within 0.2 mm [0.02 cm] of the inked radial soft tissue margin). Treatment effect present, predominantly invasive acellular mucin associated with only very rare  small groups of viable tumor  cells (near complete response). 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 AJCC Pathologic Stage: ypT3 pN1a. See Cancer Case Summary.  C.POSTERIOR ANAL CANAL, EXCISION: Benign rectal tissue. Negative for malignancy.    01/26/2017 - 05/04/2017 Chemotherapy    Adjuvant FOLFOX q2 weeks, for 8 cycles          04/04/2017 Imaging    CT CAP IMPRESSION: New complete interval resolution of bulky rectal soft tissue mass since prior exam.  No evidence of local or distant metastatic disease.    06/13/2017 Surgery    CLOSURE OF LOOP ILEOSTOMY Dannielle Burn, MD [facility] 06/13/2017    10/19/2017 Imaging    CT CAP IMPRESSION: Status post low anterior section with reanastomosis. Prior diverting ileostomy has been reversed.  No findings suspicious for recurrent or metastatic disease.  Trace gas in the uterine fundus, nonspecific. While not distinctly visualized on this CT, this appearance can sometimes be related to colovaginal fistula.  Additional ancillary findings as above    CURRENT THERAPY Surveillance   INTERVAL HISTORY: Tonya [name] is a 67 y.o. female who is here for follow-up. She is here alone. She states that her urologist suggested a vaginal pump, but pt is interested in seeing a gastroenterologist. She says that her diarrhea is not improving. She's been on biofeedback for a month now with no improvement. Her urologist suggested she stops biofeedback.  Pertinent positives and negatives of review of systems are listed and detailed within the above HPI.   REVIEW OF SYSTEMS:   Constitutional: Denies fevers, chills or abnormal weight loss Eyes: (+) worsening blurriness of vision Ears, nose, mouth, throat, and face:  Denies mucositis or sore throat Respiratory: Denies cough, dyspnea or wheezes Cardiovascular: Denies palpitation, chest discomfort or lower extremity swelling Gastrointestinal:  Denies nausea, heartburn or change in bowel habits Skin: Denies abnormal skin rashes Lymphatics: Denies new lymphadenopathy or easy bruising Neurological:Denies numbness, tingling or new weaknesses Behavioral/Psych: Mood is stable, no new changes  All other systems were reviewed with the patient and are negative.  MEDICAL HISTORY:  Past Medical History:  Diagnosis Date  . Coronary artery disease 07/2003  . Myocardial infarction ([place])   . Obesity 2003   s/p gastric bypass     SURGICAL HISTORY: Past Surgical History:  Procedure Laterality Date  . CHOLECYSTECTOMY  1999  . GASTRIC BYPASS  2003  . IR FLUORO GUIDE PORT INSERTION RIGHT  01/25/2017  . IR US GUIDE VASC ACCESS RIGHT  01/25/2017    I have reviewed the social history and family history with the patient and they are unchanged from previous note.  ALLERGIES:  is allergic to bactrim [sulfamethoxazole-trimethoprim]; phenergan [promethazine hcl]; sulfa antibiotics; and penicillins.  MEDICATIONS:  Current Outpatient Medications  Medication Sig Dispense Refill  . aspirin 81 MG chewable tablet Chew 81 mg by mouth daily.    . Cholecalciferol (VITAMIN D3) 2000 units capsule Take 1 capsule by mouth daily.    . codeine 15 MG tablet Take 15 mg by mouth 2 (two) times daily.    . diphenoxylate-atropine (LOMOTIL) 2.5-0.025 MG tablet TAKE 1 TO 2 TABLETS BY MOUTH FOUR TIMES DAILY AS NEEDED 120 tablet 3  . Eluxadoline (VIBERZI) 100 MG TABS Take by mouth 2 (two) times daily.    . ferrous sulfate (FERROUSUL) 325 (65 FE) MG tablet Take 1 tablet (325 mg total) by mouth  daily with breakfast. (Patient taking differently: Take 325 mg by mouth. Take two by mouth daily) 30 tablet 6  . ibuprofen (ADVIL,MOTRIN) 200 MG tablet Take 200 mg by mouth every 6 (six) hours as needed.     . Multiple Vitamin (MULTIVITAMIN) tablet Take 1 tablet by mouth daily.    . urea (CARMOL) 10 % cream Apply topically as needed. 71 g 1   Current Facility-Administered Medications  Medication Dose Route Frequency Provider Last Rate Last Dose  . 0.9 %  sodium chloride infusion  500 mL Intravenous Continuous Irene Shipper, MD       Facility-Administered Medications Ordered in Other Visits  Medication Dose Route Frequency Provider Last Rate Last Dose  . Tbo-Filgrastim (GRANIX) injection 480 mcg  480 mcg Subcutaneous Once Truitt Merle, MD        PHYSICAL EXAMINATION: ECOG PERFORMANCE STATUS: 1 - Symptomatic but completely ambulatory  Vitals:   12/05/17 0959  BP: 132/71  Pulse: 69  Resp: 18  Temp: 97.7 F (36.5 C)  SpO2: 100%   Filed Weights   12/05/17 0959  Weight: 133 lb 12.8 oz (60.7 kg)    GENERAL:alert, no distress and comfortable SKIN: skin color, texture, turgor are normal, no rashes or significant lesions EYES: normal, Conjunctiva are pink and non-injected, sclera clear OROPHARYNX:no exudate, no erythema and lips, buccal mucosa, and tongue normal  NECK: supple, thyroid normal size, non-tender, without nodularity LYMPH:  no palpable lymphadenopathy in the cervical, axillary or inguinal LUNGS: clear to auscultation and percussion with normal breathing effort HEART: regular rate & rhythm and no murmurs and no lower extremity edema ABDOMEN:abdomen soft, non-tender and normal bowel sounds, she declined rectal exam today  Musculoskeletal:no cyanosis of digits and no clubbing  NEURO: alert & oriented x 3 with fluent speech, no focal motor/sensory deficits  LABORATORY DATA:  I have reviewed the data as listed CBC Latest Ref Rng & Units 12/05/2017 10/19/2017 08/29/2017  WBC 4.0 - 10.5 K/uL 4.9 5.8 5.8  Hemoglobin 12.0 - 15.0 g/dL 10.9(L) 10.7(L) 10.0(L)  Hematocrit 36.0 - 46.0 % 33.5(L) 33.1(L) 31.5(L)  Platelets 150 - 400 K/uL 223 222 219     CMP Latest Ref Rng & Units  12/05/2017 10/19/2017 08/16/2017  Glucose 70 - 99 mg/dL 82 82 83  BUN 8 - 23 mg/dL '15 17 18  '$ Creatinine 0.44 - 1.00 mg/dL 0.63 0.60 0.66  Sodium 135 - 145 mmol/L 143 141 141  Potassium 3.5 - 5.1 mmol/L 4.2 4.5 3.9  Chloride 98 - 111 mmol/L 110 110 107  CO2 22 - 32 mmol/L '24 23 24  '$ Calcium 8.9 - 10.3 mg/dL 8.8(L) 9.0 8.6(L)  Total Protein 6.5 - 8.1 g/dL 6.1(L) 6.3(L) 6.4(L)  Total Bilirubin 0.3 - 1.2 mg/dL 0.4 0.4 0.3  Alkaline Phos 38 - 126 U/L 106 109 123  AST 15 - 41 U/L '28 25 29  '$ ALT 0 - 44 U/L 36 27 29      RADIOGRAPHIC STUDIES: I have personally reviewed the radiological images as listed and agreed with the findings in the report. No results found.   ASSESSMENT & PLAN:  Tonya [name] is a 67 y.o. female with history of  1. Invasive low rectal adenocarcinoma, cT3N0M0, ypT3N1a,  (+) margin, MMR normal  -Diagnosed in 2018. Completed neoadjuvant chemo with concurrent radiation, and surgery.  Although she had excellent response to neoadjuvant chemoradiation, her residual tumor was still T3 and 1/12 node was positive, and radial margin was positive. She did complete 4  months adjuvant FOLFOX  -Currently on surveillance. -I reviewed her October's surveillance CT scan results with pt, which showed no evidence of recurrence. -lab reviewed, her CEA has been normal.  Her physical exam was unremarkable, no clinical concern for recurrence. -She unfortunately has developed stool incontinence since her surgery, which has severely impacted her life, and the limited her activities especially can not leave her house much.  -We will continue cancer surveillance, see her back in 3 months.  2. Diarrhea and complete stool incontinence -Started after her ileostomy reversal surgery -She uses Imodium and Lomotil as needed, compliant but not very effective  -She previously refused to have another colostomy bag -Her surgeon has discussed sacral nerve stimulation.  She is currently doing  biofeedback, not too.  She was referred to urology.  She would like to have a second opinion.  I will discuss with our colorectal surgeons  3. 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 -She takes 2 iron pills a day -Labs reviewed from 10/16, iron studies WNLs, he still has mild anemia, which is slowly improving.  4. DM, HTN -f/u with PCP and continue recommended medications.  5. Depression -improved  6. 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   No orders of the defined types were placed in this encounter.  All questions were answered. The patient knows to call the clinic with any problems, questions or concerns. No barriers to learning was detected. I spent 20 minutes counseling the patient face to face. The total time spent in the appointment was 25 minutes and more than 50% was on counseling and review of test results  I, Noor Dweik am acting as scribe for Dr. Truitt Merle.  I have reviewed the above documentation for accuracy and completeness, and I agree with the above.      Truitt Merle, MD 12/05/2017

## 2017-12-05 ENCOUNTER — Telehealth: Payer: Self-pay

## 2017-12-05 ENCOUNTER — Inpatient Hospital Stay: Payer: Medicare HMO | Attending: Hematology | Admitting: Hematology

## 2017-12-05 ENCOUNTER — Encounter: Payer: Self-pay | Admitting: Hematology

## 2017-12-05 ENCOUNTER — Telehealth: Payer: Self-pay | Admitting: Emergency Medicine

## 2017-12-05 ENCOUNTER — Inpatient Hospital Stay: Payer: Medicare HMO

## 2017-12-05 VITALS — BP 132/71 | HR 69 | Temp 97.7°F | Resp 18 | Ht 60.0 in | Wt 133.8 lb

## 2017-12-05 DIAGNOSIS — Z79899 Other long term (current) drug therapy: Secondary | ICD-10-CM | POA: Diagnosis not present

## 2017-12-05 DIAGNOSIS — R197 Diarrhea, unspecified: Secondary | ICD-10-CM | POA: Diagnosis not present

## 2017-12-05 DIAGNOSIS — C787 Secondary malignant neoplasm of liver and intrahepatic bile duct: Secondary | ICD-10-CM | POA: Insufficient documentation

## 2017-12-05 DIAGNOSIS — Z9884 Bariatric surgery status: Secondary | ICD-10-CM

## 2017-12-05 DIAGNOSIS — Z9049 Acquired absence of other specified parts of digestive tract: Secondary | ICD-10-CM | POA: Insufficient documentation

## 2017-12-05 DIAGNOSIS — D500 Iron deficiency anemia secondary to blood loss (chronic): Secondary | ICD-10-CM

## 2017-12-05 DIAGNOSIS — C20 Malignant neoplasm of rectum: Secondary | ICD-10-CM

## 2017-12-05 DIAGNOSIS — Z7982 Long term (current) use of aspirin: Secondary | ICD-10-CM | POA: Insufficient documentation

## 2017-12-05 DIAGNOSIS — Z452 Encounter for adjustment and management of vascular access device: Secondary | ICD-10-CM | POA: Insufficient documentation

## 2017-12-05 DIAGNOSIS — I10 Essential (primary) hypertension: Secondary | ICD-10-CM | POA: Insufficient documentation

## 2017-12-05 DIAGNOSIS — Z923 Personal history of irradiation: Secondary | ICD-10-CM | POA: Diagnosis not present

## 2017-12-05 DIAGNOSIS — Z9221 Personal history of antineoplastic chemotherapy: Secondary | ICD-10-CM | POA: Diagnosis not present

## 2017-12-05 DIAGNOSIS — Z95828 Presence of other vascular implants and grafts: Secondary | ICD-10-CM

## 2017-12-05 LAB — RETICULOCYTES
Immature Retic Fract: 21.1 % — ABNORMAL HIGH (ref 2.3–15.9)
RBC.: 3.43 MIL/uL — AB (ref 3.87–5.11)
RETIC COUNT ABSOLUTE: 76.1 10*3/uL (ref 19.0–186.0)
RETIC CT PCT: 2.2 % (ref 0.4–3.1)

## 2017-12-05 LAB — CMP (CANCER CENTER ONLY)
ALBUMIN: 3.4 g/dL — AB (ref 3.5–5.0)
ALT: 36 U/L (ref 0–44)
AST: 28 U/L (ref 15–41)
Alkaline Phosphatase: 106 U/L (ref 38–126)
Anion gap: 9 (ref 5–15)
BILIRUBIN TOTAL: 0.4 mg/dL (ref 0.3–1.2)
BUN: 15 mg/dL (ref 8–23)
CHLORIDE: 110 mmol/L (ref 98–111)
CO2: 24 mmol/L (ref 22–32)
CREATININE: 0.63 mg/dL (ref 0.44–1.00)
Calcium: 8.8 mg/dL — ABNORMAL LOW (ref 8.9–10.3)
GFR, Est AFR Am: >60 mL/min (ref >60)
Glucose, Bld: 82 mg/dL (ref 70–99)
POTASSIUM: 4.2 mmol/L (ref 3.5–5.1)
Sodium: 143 mmol/L (ref 135–145)
Total Protein: 6.1 g/dL — ABNORMAL LOW (ref 6.5–8.1)

## 2017-12-05 LAB — CBC WITH DIFFERENTIAL (CANCER CENTER ONLY)
ABS IMMATURE GRANULOCYTES: 0.02 10*3/uL (ref 0.00–0.07)
BASOS PCT: 0 %
Basophils Absolute: 0 10*3/uL (ref 0.0–0.1)
Eosinophils Absolute: 0 10*3/uL (ref 0.0–0.5)
Eosinophils Relative: 1 %
HEMATOCRIT: 33.5 % — AB (ref 36.0–46.0)
HEMOGLOBIN: 10.9 g/dL — AB (ref 12.0–15.0)
IMMATURE GRANULOCYTES: 0 %
Lymphocytes Relative: 12 %
Lymphs Abs: 0.6 10*3/uL — ABNORMAL LOW (ref 0.7–4.0)
MCH: 31.8 pg (ref 26.0–34.0)
MCHC: 32.5 g/dL (ref 30.0–36.0)
MCV: 97.7 fL (ref 80.0–100.0)
MONO ABS: 0.4 10*3/uL (ref 0.1–1.0)
MONOS PCT: 9 %
NEUTROS ABS: 3.8 10*3/uL (ref 1.7–7.7)
NEUTROS PCT: 78 %
Platelet Count: 223 10*3/uL (ref 150–400)
RBC: 3.43 MIL/uL — ABNORMAL LOW (ref 3.87–5.11)
RDW: 12.5 % (ref 11.5–15.5)
WBC Count: 4.9 10*3/uL (ref 4.0–10.5)
nRBC: 0 % (ref 0.0–0.2)

## 2017-12-05 LAB — CEA (IN HOUSE-CHCC): CEA (CHCC-IN HOUSE): 2.67 ng/mL (ref 0.00–5.00)

## 2017-12-05 MED ORDER — SODIUM CHLORIDE 0.9% FLUSH
10.0000 mL | Freq: Once | INTRAVENOUS | Status: AC
  Administered 2017-12-05: 10 mL
  Filled 2017-12-05: qty 10

## 2017-12-05 MED ORDER — HEPARIN SOD (PORK) LOCK FLUSH 100 UNIT/ML IV SOLN
500.0000 [IU] | Freq: Once | INTRAVENOUS | Status: AC
  Administered 2017-12-05: 500 [IU] via INTRAVENOUS
  Filled 2017-12-05: qty 5

## 2017-12-05 NOTE — Telephone Encounter (Signed)
Printed avs and calender of upcoming appointment. Per 12/2 los 

## 2017-12-05 NOTE — Telephone Encounter (Addendum)
Pt verbalized understanding   ----- Message from Alla Feeling, NP sent at 12/05/2017  1:24 PM EST ----- Please let her know CEA Is normal.  Thanks, Regan Rakers

## 2017-12-06 DIAGNOSIS — Z0100 Encounter for examination of eyes and vision without abnormal findings: Secondary | ICD-10-CM | POA: Diagnosis not present

## 2017-12-06 DIAGNOSIS — H524 Presbyopia: Secondary | ICD-10-CM | POA: Diagnosis not present

## 2017-12-06 DIAGNOSIS — M6281 Muscle weakness (generalized): Secondary | ICD-10-CM | POA: Diagnosis not present

## 2017-12-06 DIAGNOSIS — R195 Other fecal abnormalities: Secondary | ICD-10-CM | POA: Diagnosis not present

## 2017-12-06 DIAGNOSIS — M62838 Other muscle spasm: Secondary | ICD-10-CM | POA: Diagnosis not present

## 2017-12-06 DIAGNOSIS — R102 Pelvic and perineal pain: Secondary | ICD-10-CM | POA: Diagnosis not present

## 2017-12-13 ENCOUNTER — Telehealth: Payer: Self-pay | Admitting: *Deleted

## 2017-12-13 NOTE — Telephone Encounter (Signed)
Received vm call from pt asking if Dr Burr Medico has talked to anyone about her.  She states that Dr Burr Medico was going to look into something for her, "implant or test"  Message to Dr Burr Medico.

## 2017-12-13 NOTE — Telephone Encounter (Signed)
I called pt back and will refer her back to Dr. Marcello Moores. She agrees.   Truitt Merle MD

## 2017-12-15 DIAGNOSIS — R195 Other fecal abnormalities: Secondary | ICD-10-CM | POA: Diagnosis not present

## 2017-12-15 DIAGNOSIS — M6281 Muscle weakness (generalized): Secondary | ICD-10-CM | POA: Diagnosis not present

## 2017-12-15 DIAGNOSIS — R102 Pelvic and perineal pain: Secondary | ICD-10-CM | POA: Diagnosis not present

## 2017-12-15 DIAGNOSIS — M62838 Other muscle spasm: Secondary | ICD-10-CM | POA: Diagnosis not present

## 2017-12-16 ENCOUNTER — Inpatient Hospital Stay: Payer: Medicare HMO

## 2018-01-04 DIAGNOSIS — Z9221 Personal history of antineoplastic chemotherapy: Secondary | ICD-10-CM

## 2018-01-04 HISTORY — DX: Personal history of antineoplastic chemotherapy: Z92.21

## 2018-01-05 ENCOUNTER — Telehealth: Payer: Self-pay | Admitting: Hematology

## 2018-01-05 NOTE — Telephone Encounter (Signed)
Patient cancelled 1/24

## 2018-01-10 ENCOUNTER — Telehealth: Payer: Self-pay | Admitting: Hematology

## 2018-01-10 DIAGNOSIS — R159 Full incontinence of feces: Secondary | ICD-10-CM | POA: Diagnosis not present

## 2018-01-10 DIAGNOSIS — K6289 Other specified diseases of anus and rectum: Secondary | ICD-10-CM | POA: Diagnosis not present

## 2018-01-10 NOTE — Telephone Encounter (Signed)
Called patient per 1/2 sch message - pt is aware of appts.

## 2018-01-10 NOTE — Telephone Encounter (Signed)
Tried to reach regarding voicemail. I did leave a message to stop by scheduling to get a calendar

## 2018-01-12 DIAGNOSIS — R69 Illness, unspecified: Secondary | ICD-10-CM | POA: Diagnosis not present

## 2018-01-16 ENCOUNTER — Inpatient Hospital Stay: Payer: Medicare HMO

## 2018-01-16 ENCOUNTER — Inpatient Hospital Stay: Payer: Medicare HMO | Attending: Hematology

## 2018-01-16 DIAGNOSIS — Z452 Encounter for adjustment and management of vascular access device: Secondary | ICD-10-CM | POA: Insufficient documentation

## 2018-01-16 DIAGNOSIS — C20 Malignant neoplasm of rectum: Secondary | ICD-10-CM | POA: Diagnosis present

## 2018-01-16 DIAGNOSIS — D500 Iron deficiency anemia secondary to blood loss (chronic): Secondary | ICD-10-CM

## 2018-01-16 DIAGNOSIS — Z95828 Presence of other vascular implants and grafts: Secondary | ICD-10-CM

## 2018-01-16 LAB — CBC WITH DIFFERENTIAL (CANCER CENTER ONLY)
ABS IMMATURE GRANULOCYTES: 0.01 10*3/uL (ref 0.00–0.07)
BASOS ABS: 0 10*3/uL (ref 0.0–0.1)
BASOS PCT: 0 %
Eosinophils Absolute: 0.1 10*3/uL (ref 0.0–0.5)
Eosinophils Relative: 1 %
HCT: 32.8 % — ABNORMAL LOW (ref 36.0–46.0)
Hemoglobin: 10.5 g/dL — ABNORMAL LOW (ref 12.0–15.0)
Immature Granulocytes: 0 %
Lymphocytes Relative: 13 %
Lymphs Abs: 0.6 10*3/uL — ABNORMAL LOW (ref 0.7–4.0)
MCH: 31.8 pg (ref 26.0–34.0)
MCHC: 32 g/dL (ref 30.0–36.0)
MCV: 99.4 fL (ref 80.0–100.0)
MONO ABS: 0.5 10*3/uL (ref 0.1–1.0)
Monocytes Relative: 9 %
NEUTROS ABS: 3.7 10*3/uL (ref 1.7–7.7)
NEUTROS PCT: 77 %
NRBC: 0 % (ref 0.0–0.2)
PLATELETS: 201 10*3/uL (ref 150–400)
RBC: 3.3 MIL/uL — AB (ref 3.87–5.11)
RDW: 12.4 % (ref 11.5–15.5)
WBC: 4.8 10*3/uL (ref 4.0–10.5)

## 2018-01-16 LAB — CMP (CANCER CENTER ONLY)
ALT: 35 U/L (ref 0–44)
ANION GAP: 6 (ref 5–15)
AST: 25 U/L (ref 15–41)
Albumin: 3.3 g/dL — ABNORMAL LOW (ref 3.5–5.0)
Alkaline Phosphatase: 109 U/L (ref 38–126)
BILIRUBIN TOTAL: 0.3 mg/dL (ref 0.3–1.2)
BUN: 15 mg/dL (ref 8–23)
CO2: 23 mmol/L (ref 22–32)
Calcium: 8.2 mg/dL — ABNORMAL LOW (ref 8.9–10.3)
Chloride: 111 mmol/L (ref 98–111)
Creatinine: 0.65 mg/dL (ref 0.44–1.00)
Glucose, Bld: 89 mg/dL (ref 70–99)
POTASSIUM: 4.3 mmol/L (ref 3.5–5.1)
Sodium: 140 mmol/L (ref 135–145)
TOTAL PROTEIN: 6 g/dL — AB (ref 6.5–8.1)

## 2018-01-16 LAB — RETICULOCYTES
IMMATURE RETIC FRACT: 17.5 % — AB (ref 2.3–15.9)
RBC.: 3.3 MIL/uL — AB (ref 3.87–5.11)
RETIC CT PCT: 2.4 % (ref 0.4–3.1)
Retic Count, Absolute: 78.5 10*3/uL (ref 19.0–186.0)

## 2018-01-16 MED ORDER — SODIUM CHLORIDE 0.9% FLUSH
10.0000 mL | INTRAVENOUS | Status: DC | PRN
  Administered 2018-01-16: 10 mL via INTRAVENOUS
  Filled 2018-01-16: qty 10

## 2018-01-16 MED ORDER — HEPARIN SOD (PORK) LOCK FLUSH 100 UNIT/ML IV SOLN
500.0000 [IU] | Freq: Once | INTRAVENOUS | Status: DC
  Filled 2018-01-16: qty 5

## 2018-01-20 ENCOUNTER — Telehealth: Payer: Self-pay

## 2018-01-20 NOTE — Telephone Encounter (Signed)
Spoke with patient with lab results, CBC, CMP within normal limits, anemia is stable per Dr. Burr Medico. Patient verbalized an understanding.

## 2018-01-27 DIAGNOSIS — R8290 Unspecified abnormal findings in urine: Secondary | ICD-10-CM | POA: Diagnosis not present

## 2018-01-27 DIAGNOSIS — R159 Full incontinence of feces: Secondary | ICD-10-CM | POA: Diagnosis not present

## 2018-01-27 DIAGNOSIS — Z466 Encounter for fitting and adjustment of urinary device: Secondary | ICD-10-CM | POA: Diagnosis not present

## 2018-01-31 DIAGNOSIS — K6289 Other specified diseases of anus and rectum: Secondary | ICD-10-CM | POA: Diagnosis not present

## 2018-01-31 DIAGNOSIS — R159 Full incontinence of feces: Secondary | ICD-10-CM | POA: Diagnosis not present

## 2018-02-01 DIAGNOSIS — R159 Full incontinence of feces: Secondary | ICD-10-CM | POA: Diagnosis not present

## 2018-02-03 ENCOUNTER — Telehealth: Payer: Self-pay | Admitting: Internal Medicine

## 2018-02-03 DIAGNOSIS — K912 Postsurgical malabsorption, not elsewhere classified: Secondary | ICD-10-CM | POA: Diagnosis not present

## 2018-02-03 DIAGNOSIS — Z9884 Bariatric surgery status: Secondary | ICD-10-CM | POA: Diagnosis not present

## 2018-02-03 DIAGNOSIS — Z9889 Other specified postprocedural states: Secondary | ICD-10-CM | POA: Diagnosis not present

## 2018-02-03 DIAGNOSIS — R159 Full incontinence of feces: Secondary | ICD-10-CM | POA: Diagnosis not present

## 2018-02-03 DIAGNOSIS — Z85048 Personal history of other malignant neoplasm of rectum, rectosigmoid junction, and anus: Secondary | ICD-10-CM | POA: Diagnosis not present

## 2018-02-03 NOTE — Telephone Encounter (Signed)
Dr. Henrene Pastor, pt requests to switch provider to Dr. Silverio Decamp because her friends recommend Dr. Silverio Decamp.  She says it's nothing personal against you.   Would you approve this switch?

## 2018-02-06 ENCOUNTER — Ambulatory Visit: Payer: Medicare HMO | Admitting: Gastroenterology

## 2018-02-06 ENCOUNTER — Encounter

## 2018-02-06 NOTE — Telephone Encounter (Signed)
No problem.

## 2018-02-08 ENCOUNTER — Encounter: Payer: Self-pay | Admitting: Gastroenterology

## 2018-02-08 NOTE — Telephone Encounter (Signed)
Dr. Silverio Decamp will approve the switch?

## 2018-02-08 NOTE — Telephone Encounter (Signed)
ok 

## 2018-02-09 DIAGNOSIS — K591 Functional diarrhea: Secondary | ICD-10-CM | POA: Diagnosis not present

## 2018-02-09 DIAGNOSIS — K909 Intestinal malabsorption, unspecified: Secondary | ICD-10-CM | POA: Diagnosis not present

## 2018-02-15 DIAGNOSIS — R197 Diarrhea, unspecified: Secondary | ICD-10-CM | POA: Diagnosis not present

## 2018-02-15 DIAGNOSIS — K909 Intestinal malabsorption, unspecified: Secondary | ICD-10-CM | POA: Diagnosis not present

## 2018-02-15 DIAGNOSIS — K591 Functional diarrhea: Secondary | ICD-10-CM | POA: Diagnosis not present

## 2018-02-17 DIAGNOSIS — K591 Functional diarrhea: Secondary | ICD-10-CM | POA: Diagnosis not present

## 2018-02-17 DIAGNOSIS — K909 Intestinal malabsorption, unspecified: Secondary | ICD-10-CM | POA: Diagnosis not present

## 2018-02-17 DIAGNOSIS — R197 Diarrhea, unspecified: Secondary | ICD-10-CM | POA: Diagnosis not present

## 2018-02-24 ENCOUNTER — Ambulatory Visit: Payer: Medicare HMO | Admitting: Gastroenterology

## 2018-02-24 NOTE — Progress Notes (Signed)
[place]   [phone] [phone]) [phone]   Clinic Follow up Note   Patient Care Team: Lucille Passy, MD as PCP - General (Family Medicine) Irene Shipper, MD as Consulting Physician (Gastroenterology) Leighton Ruff, MD as Consulting Physician (General Surgery) Truitt Merle, MD as Consulting Physician (Hematology) Kyung Rudd, MD as Consulting Physician (Radiation Oncology)  Date of Service:  02/27/2018  CHIEF COMPLAINT: F/u on colon cancer  SUMMARY OF ONCOLOGIC HISTORY: Oncology History   Cancer Staging Rectal adenocarcinoma [facility]) Staging form: Colon and Rectum, AJCC 8th Edition - Clinical stage from 07/09/2016: Stage IIA (cT3, cN0, cM0) - Signed by Truitt Merle, MD on 08/16/2016       Rectal adenocarcinoma ([place])   07/09/2016 Pathology Results    Diagnosis Surgical [P], rectum mass - INVASIVE ADENOCARCINOMA. - SEE COMMENT.    07/09/2016 Procedure    Colonoscopy A non-obstructing large friable mass was found in the distal rectum. This began at approximately 1 cm above the anal verge and extended proximal for a distance of 8-10 cm. The lesion was bulky and involved approximately 75% of the luminal circumference. Multiple biopsies were taken. A few small-mouthed diverticula were found in the left colon. The exam was otherwise without abnormality on direct views. Retroflexion not performed purposely.    07/12/2016 Imaging    CT C/A/P with contrast IMPRESSION: Rectal wall thickening/mass, compatible with known rectal adenocarcinoma. No findings specific for metastatic disease in the chest, abdomen, or pelvis. 2.2 cm probable hemangioma in segment 4B, although poorly evaluated. Consider MRI abdomen with/ without multihance contrast for definitive characterization, as clinically warranted. Bilateral adrenal nodules measuring up to 1.6 cm on the right, likely reflecting benign adrenal adenomas, although technically indeterminate. If MRI abdomen is performed, these  can be definitively characterized at that time.    07/20/2016 Initial Diagnosis    Rectal adenocarcinoma ([place])    08/03/2016 Imaging    MRI AP W WO Contrast 08/03/16 IMPRESSION: 1. Large rectal mass measures 6.1 cm in length. No obstruction identified. This is compatible with at least a T3bN0M0 lesion. 2. No specific features highly suspicious for nodal metastasis or metastatic disease to the upper abdomen. 3. Indeterminate arterial phase enhancing lesion within the anterior dome of liver measures less than 1 cm. This may represent a benign liver lesions such as FNH or adenoma. Less favored with the hypervascular liver metastasis. Followup imaging at 6 months with repeat MRI of the liver is advise. 4. Bilateral adrenal adenomas    08/04/2016 - 09/16/2016 Radiation Therapy    Concurrent chemo radiation with Dr. [name]    08/04/2016 - 09/16/2016 Chemotherapy    Concurrent chemo radiation with Xeloda, 1520m twice daily    12/06/2016 Surgery    ULTRA LOW ANTERIOR RESECTION OF SIGMOID COLON AND RECTUM WITH COLOANAL ANASTAMOSIS AND LOOP ILEOSTOMY  by Dr. [place] 12/06/16    12/06/2016 Pathology Results    FINAL PATHOLOGIC DIAGNOSIS 12/06/16 MICROSCOPIC EXAMINATION AND DIAGNOSIS  A.ANAL CANAL, MUCOSECTOMY: Benign rectal and fibroadipose tissue. Negative for malignancy.  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 Mucinous tumor focally invades through the muscularis propria into pericolorectal tissue. Radial margin interpreted as involved by invasive carcinoma (invasive acellular mucin withrare viable tumor cells focally comes to within 0.2 mm [0.02 cm] of the inked radial soft tissue margin). Treatment effect present, predominantly invasive acellular mucin associated  with only very  rare small groups of viable tumor cells (near complete response). 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 AJCC Pathologic Stage: ypT3 pN1a. See Cancer Case Summary.  C.POSTERIOR ANAL CANAL, EXCISION: Benign rectal tissue. Negative for malignancy.    01/26/2017 - 05/04/2017 Chemotherapy    Adjuvant FOLFOX q2 weeks, for 8 cycles          04/04/2017 Imaging    CT CAP IMPRESSION: New complete interval resolution of bulky rectal soft tissue mass since prior exam.  No evidence of local or distant metastatic disease.    06/13/2017 Surgery    CLOSURE OF LOOP ILEOSTOMY Dannielle Burn, MD [facility] 06/13/2017    10/19/2017 Imaging    CT CAP IMPRESSION: Status post low anterior section with reanastomosis. Prior diverting ileostomy has been reversed.  No findings suspicious for recurrent or metastatic disease.  Trace gas in the uterine fundus, nonspecific. While not distinctly visualized on this CT, this appearance can sometimes be related to colovaginal fistula.  Additional ancillary findings as above      CURRENT THERAPY:  Surveillance   INTERVAL HISTORY:  Tonya [name] is here for a follow up of colon cancer. She presents to the clinic today by herself. She notes she is stable. She has seen several physicians about her diarrhea. She notes she may have surgery soon to reverse her gastric bypass. She denies any new changes, pain or concerns. She is able to eat and drink. She takes lomotil and imodium still. She tried Viberzi with no change and due to high cost, she does not plan to continue.     REVIEW OF SYSTEMS:   Constitutional: Denies fevers, chills or abnormal weight loss Eyes: Denies blurriness of vision Ears, nose,  mouth, throat, and face: Denies mucositis or sore throat Respiratory: Denies cough, dyspnea or wheezes Cardiovascular: Denies palpitation, chest discomfort or lower extremity swelling Gastrointestinal:  Denies nausea, heartburn (+) Significant diarrhea and rectal soreness Skin: Denies abnormal skin rashes Lymphatics: Denies new lymphadenopathy or easy bruising Neurological:Denies numbness, tingling or new weaknesses Behavioral/Psych: Mood is stable, no new changes  All other systems were reviewed with the patient and are negative.  MEDICAL HISTORY:  Past Medical History:  Diagnosis Date  . Coronary artery disease 07/2003  . Myocardial infarction ([place])   . Obesity 2003   s/p gastric bypass     SURGICAL HISTORY: Past Surgical History:  Procedure Laterality Date  . CHOLECYSTECTOMY  1999  . GASTRIC BYPASS  2003  . IR FLUORO GUIDE PORT INSERTION RIGHT  01/25/2017  . IR US GUIDE VASC ACCESS RIGHT  01/25/2017    I have reviewed the social history and family history with the patient and they are unchanged from previous note.  ALLERGIES:  is allergic to bactrim [sulfamethoxazole-trimethoprim]; phenergan [promethazine hcl]; sulfa antibiotics; and penicillins.  MEDICATIONS:  Current Outpatient Medications  Medication Sig Dispense Refill  . aspirin 81 MG chewable tablet Chew 81 mg by mouth daily.    . Cholecalciferol (VITAMIN D3) 2000 units capsule Take 1 capsule by mouth daily.    . codeine 15 MG tablet Take 15 mg by mouth 2 (two) times daily.    . diphenoxylate-atropine (LOMOTIL) 2.5-0.025 MG tablet TAKE 1 TO 2 TABLETS BY MOUTH FOUR TIMES DAILY AS NEEDED 120 tablet 5  . Eluxadoline (VIBERZI) 100 MG TABS Take by mouth 2 (two) times daily.    Marland Kitchen  ferrous sulfate (FERROUSUL) 325 (65 FE) MG tablet Take 1 tablet (325 mg total) by mouth daily with breakfast. (Patient taking differently: Take 325 mg by mouth. Take two by mouth daily) 30 tablet 6  . ibuprofen (ADVIL,MOTRIN) 200 MG tablet Take 200  mg by mouth every 6 (six) hours as needed.    . Multiple Vitamin (MULTIVITAMIN) tablet Take 1 tablet by mouth daily.    . urea (CARMOL) 10 % cream Apply topically as needed. 71 g 1   Current Facility-Administered Medications  Medication Dose Route Frequency Provider Last Rate Last Dose  . 0.9 %  sodium chloride infusion  500 mL Intravenous Continuous Irene Shipper, MD       Facility-Administered Medications Ordered in Other Visits  Medication Dose Route Frequency Provider Last Rate Last Dose  . Tbo-Filgrastim (GRANIX) injection 480 mcg  480 mcg Subcutaneous Once Truitt Merle, MD        PHYSICAL EXAMINATION: ECOG PERFORMANCE STATUS: 1 - Symptomatic but completely ambulatory  Vitals:   02/27/18 1045  BP: 135/71  Pulse: 65  Resp: 18  Temp: 97.7 F (36.5 C)  SpO2: 100%   Filed Weights   02/27/18 1045  Weight: 131 lb 11.2 oz (59.7 kg)    GENERAL:alert, no distress and comfortable SKIN: skin color, texture, turgor are normal, no rashes or significant lesions EYES: normal, Conjunctiva are pink and non-injected, sclera clear OROPHARYNX:no exudate, no erythema and lips, buccal mucosa, and tongue normal  NECK: supple, thyroid normal size, non-tender, without nodularity LYMPH:  no palpable lymphadenopathy in the cervical, axillary or inguinal LUNGS: clear to auscultation and percussion with normal breathing effort HEART: regular rate & rhythm and no murmurs and no lower extremity edema ABDOMEN:abdomen soft, non-tender and normal bowel sounds Musculoskeletal:no cyanosis of digits and no clubbing  NEURO: alert & oriented x 3 with fluent speech, no focal motor/sensory deficits RECTAL EXAM: (+) Skin erythema and peeling near surrounding near rectum (+) Her Anal sphincter is tight and due to rectal pain I stopped exam right past rectal opening.   LABORATORY DATA:  I have reviewed the data as listed CBC Latest Ref Rng & Units 02/27/2018 01/16/2018 12/05/2017  WBC 4.0 - 10.5 K/uL 5.6 4.8 4.9    Hemoglobin 12.0 - 15.0 g/dL 11.1(L) 10.5(L) 10.9(L)  Hematocrit 36.0 - 46.0 % 35.0(L) 32.8(L) 33.5(L)  Platelets 150 - 400 K/uL 222 201 223     CMP Latest Ref Rng & Units 02/27/2018 01/16/2018 12/05/2017  Glucose 70 - 99 mg/dL 95 89 82  BUN 8 - 23 mg/dL _0 Creatinine 0.44 - 1.00 mg/dL 0.64 0.65 0.63  Sodium 135 - 145 mmol/L 140 140 143  Potassium 3.5 - 5.1 mmol/L 3.8 4.3 4.2  Chloride 98 - 111 mmol/L 113(H) 111 110  CO2 22 - 32 mmol/L 19(L) 23 24  Calcium 8.9 - 10.3 mg/dL 8.5(L) 8.2(L) 8.8(L)  Total Protein 6.5 - 8.1 g/dL 6.3(L) 6.0(L) 6.1(L)  Total Bilirubin 0.3 - 1.2 mg/dL 0.3 0.3 0.4  Alkaline Phos 38 - 126 U/L 106 109 106  AST 15 - 41 U/L _1 ALT 0 - 44 U/L 29 35 36      RADIOGRAPHIC STUDIES: I have personally reviewed the radiological images as listed and agreed with the findings in the report. No results found.   ASSESSMENT & PLAN:  Tonya [name] is a 68 y.o. female with   1. Invasive low rectal adenocarcinoma, cT3N0M0, ypT3N1a,  (+) margin, MMR normal  -  Diagnosed in 2018. Completed neoadjuvant chemo with concurrent radiation, and surgery.  Although she had excellent response to neoadjuvant chemoradiation, her residual tumor was still T3 and 1/12 node was positive, and radial margin was positive. She did complete 4 months adjuvant FOLFOX  -Currently on surveillance. -She is clinically doing well. Labs reviewed, CBC WNL except mild anemia, CMP WNL except mild low protein level. CEA is still pending. Physical exam unremarkable except perianal skin irritation and peeling from ongoing diarrhea.  -Continue Surveillance, plan to scan her later this year in 6-8 months   -f/u in 4 months   2. Diarrhea andcompletestool incontinence, rectal soreness and skin irritation -Started after her ileostomy reversal surgery -She previously refused to have another colostomy bag -She has been seen by several colorectal surgeons and physicians. It is suspected this  prolonged diarrhea is due to her gastric bypass.   -She will continue to use Imodium and Lomotil as needed, compliant but not very effective. I advised her not to use more than 8 lomotil as day. I will refilled lomotil today (02/27/18) -She plans to do gastric bypass reversal to see if this will improve her diarrhea.   3. 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 -She takes 2 iron pills a day -Hg at 11.1 today (02/27/18), improved   4. DM, HTN -f/u with PCP and continue recommended medications. -controlled   5. Depression -- stable overall, she is depressed mainly due to her stool incontinence     Plan  -I will refill Lomotil today  -From a cancer standpoint she is clinically doing well, no concerns for recurrence -Lab and follow-up in 4 months.    No problem-specific Assessment & Plan notes found for this encounter.   No orders of the defined types were placed in this encounter.  All questions were answered. The patient knows to call the clinic with any problems, questions or concerns. No barriers to learning was detected. I spent 20 minutes counseling the patient face to face. The total time spent in the appointment was 25 minutes and more than 50% was on counseling and review of test results     Truitt Merle, MD 02/27/2018   I, Joslyn Devon, am acting as scribe for Truitt Merle, MD.   I have reviewed the above documentation for accuracy and completeness, and I agree with the above.

## 2018-02-27 ENCOUNTER — Inpatient Hospital Stay: Payer: Medicare HMO

## 2018-02-27 ENCOUNTER — Telehealth: Payer: Self-pay | Admitting: Hematology

## 2018-02-27 ENCOUNTER — Inpatient Hospital Stay: Payer: Medicare HMO | Attending: Hematology | Admitting: Hematology

## 2018-02-27 ENCOUNTER — Encounter: Payer: Self-pay | Admitting: Hematology

## 2018-02-27 VITALS — BP 135/71 | HR 65 | Temp 97.7°F | Resp 18 | Ht 60.0 in | Wt 131.7 lb

## 2018-02-27 DIAGNOSIS — R197 Diarrhea, unspecified: Secondary | ICD-10-CM | POA: Diagnosis not present

## 2018-02-27 DIAGNOSIS — C20 Malignant neoplasm of rectum: Secondary | ICD-10-CM

## 2018-02-27 DIAGNOSIS — Z923 Personal history of irradiation: Secondary | ICD-10-CM | POA: Diagnosis not present

## 2018-02-27 DIAGNOSIS — I252 Old myocardial infarction: Secondary | ICD-10-CM | POA: Diagnosis not present

## 2018-02-27 DIAGNOSIS — Z7982 Long term (current) use of aspirin: Secondary | ICD-10-CM

## 2018-02-27 DIAGNOSIS — F329 Major depressive disorder, single episode, unspecified: Secondary | ICD-10-CM | POA: Diagnosis not present

## 2018-02-27 DIAGNOSIS — D509 Iron deficiency anemia, unspecified: Secondary | ICD-10-CM

## 2018-02-27 DIAGNOSIS — R159 Full incontinence of feces: Secondary | ICD-10-CM

## 2018-02-27 DIAGNOSIS — Z9221 Personal history of antineoplastic chemotherapy: Secondary | ICD-10-CM | POA: Diagnosis not present

## 2018-02-27 DIAGNOSIS — I10 Essential (primary) hypertension: Secondary | ICD-10-CM | POA: Insufficient documentation

## 2018-02-27 DIAGNOSIS — E119 Type 2 diabetes mellitus without complications: Secondary | ICD-10-CM

## 2018-02-27 DIAGNOSIS — R69 Illness, unspecified: Secondary | ICD-10-CM | POA: Diagnosis not present

## 2018-02-27 DIAGNOSIS — Z9884 Bariatric surgery status: Secondary | ICD-10-CM | POA: Insufficient documentation

## 2018-02-27 DIAGNOSIS — Z79899 Other long term (current) drug therapy: Secondary | ICD-10-CM | POA: Diagnosis not present

## 2018-02-27 DIAGNOSIS — C787 Secondary malignant neoplasm of liver and intrahepatic bile duct: Secondary | ICD-10-CM

## 2018-02-27 DIAGNOSIS — Z791 Long term (current) use of non-steroidal anti-inflammatories (NSAID): Secondary | ICD-10-CM | POA: Diagnosis not present

## 2018-02-27 DIAGNOSIS — K591 Functional diarrhea: Secondary | ICD-10-CM

## 2018-02-27 DIAGNOSIS — D500 Iron deficiency anemia secondary to blood loss (chronic): Secondary | ICD-10-CM

## 2018-02-27 DIAGNOSIS — Z95828 Presence of other vascular implants and grafts: Secondary | ICD-10-CM

## 2018-02-27 LAB — CBC WITH DIFFERENTIAL/PLATELET
Abs Immature Granulocytes: 0.03 10*3/uL (ref 0.00–0.07)
BASOS ABS: 0 10*3/uL (ref 0.0–0.1)
BASOS PCT: 0 %
EOS ABS: 0.1 10*3/uL (ref 0.0–0.5)
EOS PCT: 1 %
HEMATOCRIT: 35 % — AB (ref 36.0–46.0)
Hemoglobin: 11.1 g/dL — ABNORMAL LOW (ref 12.0–15.0)
Immature Granulocytes: 1 %
LYMPHS ABS: 0.7 10*3/uL (ref 0.7–4.0)
Lymphocytes Relative: 12 %
MCH: 31.4 pg (ref 26.0–34.0)
MCHC: 31.7 g/dL (ref 30.0–36.0)
MCV: 99.2 fL (ref 80.0–100.0)
MONOS PCT: 9 %
Monocytes Absolute: 0.5 10*3/uL (ref 0.1–1.0)
NRBC: 0 % (ref 0.0–0.2)
Neutro Abs: 4.3 10*3/uL (ref 1.7–7.7)
Neutrophils Relative %: 77 %
Platelets: 222 10*3/uL (ref 150–400)
RBC: 3.53 MIL/uL — ABNORMAL LOW (ref 3.87–5.11)
RDW: 12.2 % (ref 11.5–15.5)
WBC: 5.6 10*3/uL (ref 4.0–10.5)

## 2018-02-27 LAB — CMP (CANCER CENTER ONLY)
ALBUMIN: 3.5 g/dL (ref 3.5–5.0)
ALK PHOS: 106 U/L (ref 38–126)
ALT: 29 U/L (ref 0–44)
ANION GAP: 8 (ref 5–15)
AST: 22 U/L (ref 15–41)
BILIRUBIN TOTAL: 0.3 mg/dL (ref 0.3–1.2)
BUN: 17 mg/dL (ref 8–23)
CALCIUM: 8.5 mg/dL — AB (ref 8.9–10.3)
CO2: 19 mmol/L — ABNORMAL LOW (ref 22–32)
Chloride: 113 mmol/L — ABNORMAL HIGH (ref 98–111)
Creatinine: 0.64 mg/dL (ref 0.44–1.00)
GFR, Est AFR Am: >60 mL/min (ref >60)
Glucose, Bld: 95 mg/dL (ref 70–99)
POTASSIUM: 3.8 mmol/L (ref 3.5–5.1)
Sodium: 140 mmol/L (ref 135–145)
TOTAL PROTEIN: 6.3 g/dL — AB (ref 6.5–8.1)

## 2018-02-27 LAB — RETICULOCYTES
Immature Retic Fract: 13.3 % (ref 2.3–15.9)
RBC.: 3.53 MIL/uL — AB (ref 3.87–5.11)
RETIC COUNT ABSOLUTE: 71.7 10*3/uL (ref 19.0–186.0)
Retic Ct Pct: 2 % (ref 0.4–3.1)

## 2018-02-27 LAB — CEA (IN HOUSE-CHCC): CEA (CHCC-IN HOUSE): 3.54 ng/mL (ref 0.00–5.00)

## 2018-02-27 MED ORDER — HEPARIN SOD (PORK) LOCK FLUSH 100 UNIT/ML IV SOLN
500.0000 [IU] | Freq: Once | INTRAVENOUS | Status: DC
  Filled 2018-02-27: qty 5

## 2018-02-27 MED ORDER — SODIUM CHLORIDE 0.9% FLUSH
10.0000 mL | INTRAVENOUS | Status: DC | PRN
  Administered 2018-02-27: 10 mL via INTRAVENOUS
  Filled 2018-02-27: qty 10

## 2018-02-27 MED ORDER — DIPHENOXYLATE-ATROPINE 2.5-0.025 MG PO TABS: ORAL_TABLET | ORAL | 5 refills | Status: DC

## 2018-02-27 NOTE — Telephone Encounter (Signed)
No los per 2/24. 

## 2018-03-01 ENCOUNTER — Telehealth: Payer: Self-pay

## 2018-03-01 ENCOUNTER — Other Ambulatory Visit: Payer: Self-pay

## 2018-03-01 ENCOUNTER — Other Ambulatory Visit: Payer: Self-pay | Admitting: Hematology

## 2018-03-01 DIAGNOSIS — K591 Functional diarrhea: Secondary | ICD-10-CM

## 2018-03-01 MED ORDER — DIPHENOXYLATE-ATROPINE 2.5-0.025 MG PO TABS: ORAL_TABLET | ORAL | 5 refills | Status: DC

## 2018-03-01 MED ORDER — ONDANSETRON HCL 8 MG PO TABS: 8.0000 mg | ORAL_TABLET | Freq: Three times a day (TID) | ORAL | 1 refills | Status: DC | PRN

## 2018-03-01 NOTE — Telephone Encounter (Signed)
Called in script for Lomotil to [facility].

## 2018-03-01 NOTE — Telephone Encounter (Signed)
Patient calls requesting script for nausea medication, has occasionally, forwarded to Dr. Burr Medico, send into [place]

## 2018-03-05 DIAGNOSIS — A0472 Enterocolitis due to Clostridium difficile, not specified as recurrent: Secondary | ICD-10-CM

## 2018-03-05 HISTORY — DX: Enterocolitis due to Clostridium difficile, not specified as recurrent: A04.72

## 2018-03-09 DIAGNOSIS — K591 Functional diarrhea: Secondary | ICD-10-CM | POA: Insufficient documentation

## 2018-03-09 DIAGNOSIS — E43 Unspecified severe protein-calorie malnutrition: Secondary | ICD-10-CM | POA: Diagnosis not present

## 2018-03-09 DIAGNOSIS — Z9884 Bariatric surgery status: Secondary | ICD-10-CM | POA: Diagnosis not present

## 2018-03-09 DIAGNOSIS — R197 Diarrhea, unspecified: Secondary | ICD-10-CM | POA: Diagnosis not present

## 2018-03-09 DIAGNOSIS — K909 Intestinal malabsorption, unspecified: Secondary | ICD-10-CM | POA: Diagnosis not present

## 2018-03-14 ENCOUNTER — Telehealth: Payer: Self-pay | Admitting: Hematology

## 2018-03-14 NOTE — Telephone Encounter (Signed)
Patient called to reschedule  °

## 2018-03-24 ENCOUNTER — Inpatient Hospital Stay: Payer: Medicare HMO | Attending: Hematology

## 2018-03-24 ENCOUNTER — Other Ambulatory Visit: Payer: Self-pay

## 2018-03-24 DIAGNOSIS — Z8639 Personal history of other endocrine, nutritional and metabolic disease: Secondary | ICD-10-CM | POA: Diagnosis not present

## 2018-03-24 DIAGNOSIS — C20 Malignant neoplasm of rectum: Secondary | ICD-10-CM | POA: Insufficient documentation

## 2018-03-24 DIAGNOSIS — E46 Unspecified protein-calorie malnutrition: Secondary | ICD-10-CM | POA: Diagnosis not present

## 2018-03-24 DIAGNOSIS — Z452 Encounter for adjustment and management of vascular access device: Secondary | ICD-10-CM | POA: Insufficient documentation

## 2018-03-24 DIAGNOSIS — D500 Iron deficiency anemia secondary to blood loss (chronic): Secondary | ICD-10-CM

## 2018-03-24 DIAGNOSIS — Z95828 Presence of other vascular implants and grafts: Secondary | ICD-10-CM

## 2018-03-24 MED ORDER — HEPARIN SOD (PORK) LOCK FLUSH 100 UNIT/ML IV SOLN
500.0000 [IU] | Freq: Once | INTRAVENOUS | Status: AC
  Administered 2018-03-24: 500 [IU] via INTRAVENOUS
  Filled 2018-03-24: qty 5

## 2018-03-24 MED ORDER — SODIUM CHLORIDE 0.9% FLUSH
10.0000 mL | Freq: Once | INTRAVENOUS | Status: AC
  Administered 2018-03-24: 10 mL
  Filled 2018-03-24: qty 10

## 2018-03-28 DIAGNOSIS — E876 Hypokalemia: Secondary | ICD-10-CM | POA: Diagnosis not present

## 2018-03-28 DIAGNOSIS — R112 Nausea with vomiting, unspecified: Secondary | ICD-10-CM | POA: Diagnosis not present

## 2018-03-28 DIAGNOSIS — D509 Iron deficiency anemia, unspecified: Secondary | ICD-10-CM | POA: Diagnosis not present

## 2018-03-28 DIAGNOSIS — Z7982 Long term (current) use of aspirin: Secondary | ICD-10-CM | POA: Diagnosis not present

## 2018-03-28 DIAGNOSIS — K529 Noninfective gastroenteritis and colitis, unspecified: Secondary | ICD-10-CM | POA: Diagnosis not present

## 2018-03-28 DIAGNOSIS — K660 Peritoneal adhesions (postprocedural) (postinfection): Secondary | ICD-10-CM | POA: Diagnosis not present

## 2018-03-28 DIAGNOSIS — E43 Unspecified severe protein-calorie malnutrition: Secondary | ICD-10-CM | POA: Diagnosis not present

## 2018-03-28 DIAGNOSIS — Z9221 Personal history of antineoplastic chemotherapy: Secondary | ICD-10-CM | POA: Diagnosis not present

## 2018-03-28 DIAGNOSIS — R32 Unspecified urinary incontinence: Secondary | ICD-10-CM | POA: Diagnosis not present

## 2018-03-28 DIAGNOSIS — G8918 Other acute postprocedural pain: Secondary | ICD-10-CM | POA: Diagnosis not present

## 2018-03-28 DIAGNOSIS — E46 Unspecified protein-calorie malnutrition: Secondary | ICD-10-CM | POA: Diagnosis not present

## 2018-03-28 DIAGNOSIS — Z9884 Bariatric surgery status: Secondary | ICD-10-CM | POA: Diagnosis not present

## 2018-03-28 DIAGNOSIS — S36438A Laceration of other part of small intestine, initial encounter: Secondary | ICD-10-CM | POA: Diagnosis not present

## 2018-03-28 DIAGNOSIS — Z6825 Body mass index (BMI) 25.0-25.9, adult: Secondary | ICD-10-CM | POA: Diagnosis not present

## 2018-03-28 DIAGNOSIS — Z85048 Personal history of other malignant neoplasm of rectum, rectosigmoid junction, and anus: Secondary | ICD-10-CM | POA: Diagnosis not present

## 2018-03-29 LAB — CBC AND DIFFERENTIAL
HCT: 35 — AB (ref 36–46)
Hemoglobin: 11.7 — AB (ref 12.0–16.0)
Platelets: 197 (ref 150–399)
WBC: 9.8

## 2018-03-29 LAB — BASIC METABOLIC PANEL
BUN: 12 (ref 4–21)
Creatinine: 0.6 (ref 0.5–1.1)
Glucose: 157
Potassium: 5.1 (ref 3.4–5.3)
Sodium: 140 (ref 137–147)

## 2018-03-30 LAB — CBC AND DIFFERENTIAL
HCT: 34 — AB (ref 36–46)
Hemoglobin: 11.4 — AB (ref 12.0–16.0)
Platelets: 190 (ref 150–399)
WBC: 6.4

## 2018-03-30 LAB — BASIC METABOLIC PANEL
BUN: 12 (ref 4–21)
Creatinine: 0.5 (ref 0.5–1.1)
Glucose: 87
Potassium: 4 (ref 3.4–5.3)
Sodium: 139 (ref 137–147)

## 2018-03-31 LAB — BASIC METABOLIC PANEL
BUN: 10 (ref 4–21)
Creatinine: 0.4 — AB (ref 0.5–1.1)
Glucose: 89
Potassium: 3.8 (ref 3.4–5.3)
Sodium: 140 (ref 137–147)

## 2018-03-31 LAB — CBC AND DIFFERENTIAL
HCT: 36 (ref 36–46)
Hemoglobin: 12.2 (ref 12.0–16.0)
Neutrophils Absolute: 5
Platelets: 206 (ref 150–399)
WBC: 6.3

## 2018-04-02 LAB — CBC AND DIFFERENTIAL
HCT: 39 (ref 36–46)
HCT: 39 (ref 36–46)
Hemoglobin: 12.6 (ref 12.0–16.0)
Hemoglobin: 12.6 (ref 12.0–16.0)
Neutrophils Absolute: 5
Platelets: 206 (ref 150–399)
Platelets: 206 (ref 150–399)
WBC: 6.4
WBC: 6.4

## 2018-04-02 LAB — BASIC METABOLIC PANEL
BUN: 11 (ref 4–21)
Creatinine: 0.6 (ref 0.5–1.1)
Glucose: 127
Potassium: 4.2 (ref 3.4–5.3)
Sodium: 136 — AB (ref 137–147)

## 2018-04-03 LAB — CBC AND DIFFERENTIAL
HCT: 35 — AB (ref 36–46)
Hemoglobin: 11.3 — AB (ref 12.0–16.0)
Platelets: 222 (ref 150–399)
WBC: 5.4

## 2018-04-03 LAB — BASIC METABOLIC PANEL
BUN: 11 (ref 4–21)
Creatinine: 0.5 (ref 0.5–1.1)
Glucose: 92
Potassium: 3.5 (ref 3.4–5.3)
Sodium: 140 (ref 137–147)

## 2018-04-04 LAB — CBC AND DIFFERENTIAL
HCT: 37 (ref 36–46)
Hemoglobin: 12.3 (ref 12.0–16.0)
Platelets: 224 (ref 150–399)
WBC: 6.1

## 2018-04-04 LAB — BASIC METABOLIC PANEL
BUN: 10 (ref 4–21)
Glucose: 89
Potassium: 3.3 — AB (ref 3.4–5.3)
Sodium: 139 (ref 137–147)

## 2018-04-05 LAB — BASIC METABOLIC PANEL
BUN: 10 (ref 4–21)
Creatinine: 0.4 — AB (ref 0.5–1.1)
Glucose: 86
Potassium: 3.2 — AB (ref 3.4–5.3)
Sodium: 136 — AB (ref 137–147)

## 2018-04-06 LAB — BASIC METABOLIC PANEL
BUN: 7 (ref 4–21)
Creatinine: 0.4 — AB (ref 0.5–1.1)
Glucose: 136
Potassium: 3.4 (ref 3.4–5.3)
Sodium: 135 — AB (ref 137–147)

## 2018-04-13 DIAGNOSIS — R110 Nausea: Secondary | ICD-10-CM | POA: Diagnosis not present

## 2018-04-17 DIAGNOSIS — R110 Nausea: Secondary | ICD-10-CM | POA: Diagnosis not present

## 2018-04-19 ENCOUNTER — Encounter: Payer: Self-pay | Admitting: Family Medicine

## 2018-04-19 NOTE — Progress Notes (Signed)
[facility]/thx dmf 

## 2018-04-27 ENCOUNTER — Telehealth: Payer: Self-pay | Admitting: Hematology

## 2018-04-27 NOTE — Telephone Encounter (Signed)
Called regarding rescheduled appointments from 04/06, patient is notified.

## 2018-05-04 ENCOUNTER — Other Ambulatory Visit: Payer: Self-pay

## 2018-05-04 ENCOUNTER — Inpatient Hospital Stay: Payer: Medicare HMO | Attending: Hematology

## 2018-05-04 DIAGNOSIS — Z452 Encounter for adjustment and management of vascular access device: Secondary | ICD-10-CM | POA: Diagnosis present

## 2018-05-04 DIAGNOSIS — C20 Malignant neoplasm of rectum: Secondary | ICD-10-CM | POA: Diagnosis present

## 2018-05-04 DIAGNOSIS — D500 Iron deficiency anemia secondary to blood loss (chronic): Secondary | ICD-10-CM

## 2018-05-04 DIAGNOSIS — Z95828 Presence of other vascular implants and grafts: Secondary | ICD-10-CM

## 2018-05-04 MED ORDER — HEPARIN SOD (PORK) LOCK FLUSH 100 UNIT/ML IV SOLN
500.0000 [IU] | Freq: Once | INTRAVENOUS | Status: AC | PRN
  Administered 2018-05-04: 500 [IU]
  Filled 2018-05-04: qty 5

## 2018-05-04 MED ORDER — ALTEPLASE 2 MG IJ SOLR
2.0000 mg | Freq: Once | INTRAMUSCULAR | Status: DC | PRN
  Filled 2018-05-04: qty 2

## 2018-05-04 MED ORDER — SODIUM CHLORIDE 0.9% FLUSH
10.0000 mL | INTRAVENOUS | Status: DC | PRN
  Administered 2018-05-04: 13:00:00 10 mL
  Filled 2018-05-04: qty 10

## 2018-05-08 DIAGNOSIS — A09 Infectious gastroenteritis and colitis, unspecified: Secondary | ICD-10-CM | POA: Diagnosis not present

## 2018-05-26 DIAGNOSIS — Z85048 Personal history of other malignant neoplasm of rectum, rectosigmoid junction, and anus: Secondary | ICD-10-CM | POA: Diagnosis not present

## 2018-05-26 DIAGNOSIS — A0472 Enterocolitis due to Clostridium difficile, not specified as recurrent: Secondary | ICD-10-CM | POA: Diagnosis not present

## 2018-05-26 DIAGNOSIS — Z8619 Personal history of other infectious and parasitic diseases: Secondary | ICD-10-CM | POA: Diagnosis not present

## 2018-05-26 DIAGNOSIS — K909 Intestinal malabsorption, unspecified: Secondary | ICD-10-CM | POA: Diagnosis not present

## 2018-05-26 DIAGNOSIS — Z9889 Other specified postprocedural states: Secondary | ICD-10-CM | POA: Diagnosis not present

## 2018-05-26 DIAGNOSIS — R197 Diarrhea, unspecified: Secondary | ICD-10-CM | POA: Diagnosis not present

## 2018-05-28 DIAGNOSIS — A0472 Enterocolitis due to Clostridium difficile, not specified as recurrent: Secondary | ICD-10-CM | POA: Insufficient documentation

## 2018-06-01 DIAGNOSIS — Z85048 Personal history of other malignant neoplasm of rectum, rectosigmoid junction, and anus: Secondary | ICD-10-CM | POA: Diagnosis not present

## 2018-06-01 DIAGNOSIS — Z7409 Other reduced mobility: Secondary | ICD-10-CM | POA: Insufficient documentation

## 2018-06-01 DIAGNOSIS — K909 Intestinal malabsorption, unspecified: Secondary | ICD-10-CM | POA: Diagnosis not present

## 2018-06-01 DIAGNOSIS — A0471 Enterocolitis due to Clostridium difficile, recurrent: Secondary | ICD-10-CM | POA: Diagnosis not present

## 2018-06-01 DIAGNOSIS — Z9221 Personal history of antineoplastic chemotherapy: Secondary | ICD-10-CM | POA: Diagnosis not present

## 2018-06-01 DIAGNOSIS — Z9049 Acquired absence of other specified parts of digestive tract: Secondary | ICD-10-CM | POA: Diagnosis not present

## 2018-06-01 DIAGNOSIS — A0472 Enterocolitis due to Clostridium difficile, not specified as recurrent: Secondary | ICD-10-CM | POA: Diagnosis not present

## 2018-06-01 DIAGNOSIS — R159 Full incontinence of feces: Secondary | ICD-10-CM | POA: Diagnosis not present

## 2018-06-01 DIAGNOSIS — C20 Malignant neoplasm of rectum: Secondary | ICD-10-CM | POA: Diagnosis not present

## 2018-06-01 DIAGNOSIS — R197 Diarrhea, unspecified: Secondary | ICD-10-CM | POA: Diagnosis not present

## 2018-06-01 DIAGNOSIS — Z792 Long term (current) use of antibiotics: Secondary | ICD-10-CM | POA: Diagnosis not present

## 2018-06-01 DIAGNOSIS — Z923 Personal history of irradiation: Secondary | ICD-10-CM | POA: Diagnosis not present

## 2018-06-01 NOTE — Progress Notes (Signed)
Tonya [name]   [phone] [phone]   Clinic Follow up Note   Patient Care Team: Lucille Passy, MD as PCP - General (Family Medicine) Irene Shipper, MD as Consulting Physician (Gastroenterology) Leighton Ruff, MD as Consulting Physician (General Surgery) Truitt Merle, MD as Consulting Physician (Hematology) Kyung Rudd, MD as Consulting Physician (Radiation Oncology)  Date of Service:  06/05/2018  CHIEF COMPLAINT: F/u on colon cancer  SUMMARY OF ONCOLOGIC HISTORY: Oncology History   Cancer Staging Rectal adenocarcinoma [facility]) Staging form: Colon and Rectum, AJCC 8th Edition - Clinical stage from 07/09/2016: Stage IIA (cT3, cN0, cM0) - Signed by Truitt Merle, MD on 08/16/2016       Rectal adenocarcinoma ([place])   07/09/2016 Pathology Results    Diagnosis Surgical [P], rectum mass - INVASIVE ADENOCARCINOMA. - SEE COMMENT.    07/09/2016 Procedure    Colonoscopy A non-obstructing large friable mass was found in the distal rectum. This began at approximately 1 cm above the anal verge and extended proximal for a distance of 8-10 cm. The lesion was bulky and involved approximately 75% of the luminal circumference. Multiple biopsies were taken. A few small-mouthed diverticula were found in the left colon. The exam was otherwise without abnormality on direct views. Retroflexion not performed purposely.    07/12/2016 Imaging    CT C/A/P with contrast IMPRESSION: Rectal wall thickening/mass, compatible with known rectal adenocarcinoma. No findings specific for metastatic disease in the chest, abdomen, or pelvis. 2.2 cm probable hemangioma in segment 4B, although poorly evaluated. Consider MRI abdomen with/ without multihance contrast for definitive characterization, as clinically warranted. Bilateral adrenal nodules measuring up to 1.6 cm on the right, likely reflecting benign adrenal adenomas, although technically indeterminate. If MRI abdomen is performed, these can  be definitively characterized at that time.    07/20/2016 Initial Diagnosis    Rectal adenocarcinoma ([place])    08/03/2016 Imaging    MRI AP W WO Contrast 08/03/16 IMPRESSION: 1. Large rectal mass measures 6.1 cm in length. No obstruction identified. This is compatible with at least a T3bN0M0 lesion. 2. No specific features highly suspicious for nodal metastasis or metastatic disease to the upper abdomen. 3. Indeterminate arterial phase enhancing lesion within the anterior dome of liver measures less than 1 cm. This may represent a benign liver lesions such as FNH or adenoma. Less favored with the hypervascular liver metastasis. Followup imaging at 6 months with repeat MRI of the liver is advise. 4. Bilateral adrenal adenomas    08/04/2016 - 09/16/2016 Radiation Therapy    Concurrent chemo radiation with Dr. [name]    08/04/2016 - 09/16/2016 Chemotherapy    Concurrent chemo radiation with Xeloda, '1500mg'$  twice daily    12/06/2016 Surgery    ULTRA LOW ANTERIOR RESECTION OF SIGMOID COLON AND RECTUM WITH COLOANAL ANASTAMOSIS AND LOOP ILEOSTOMY  by Dr. Drue Flirt at [facility]  12/06/16    12/06/2016 Pathology Results    FINAL PATHOLOGIC DIAGNOSIS 12/06/16 MICROSCOPIC EXAMINATION AND DIAGNOSIS  A.ANAL CANAL, MUCOSECTOMY: Benign rectal and fibroadipose tissue. Negative for malignancy.  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 Mucinous tumor focally invades through the muscularis propria into pericolorectal tissue. Radial margin interpreted as involved by invasive carcinoma (invasive acellular mucin withrare viable tumor cells focally comes to within 0.2 mm [0.02 cm] of the inked radial soft tissue margin). Treatment effect present, predominantly invasive acellular mucin associated with  only very  rare small groups of viable tumor cells (near complete response). 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 AJCC Pathologic Stage: ypT3 pN1a. See Cancer Case Summary.  C.POSTERIOR ANAL CANAL, EXCISION: Benign rectal tissue. Negative for malignancy.    01/26/2017 - 05/04/2017 Chemotherapy    Adjuvant FOLFOX q2 weeks, for 8 cycles          04/04/2017 Imaging    CT CAP IMPRESSION: New complete interval resolution of bulky rectal soft tissue mass since prior exam.  No evidence of local or distant metastatic disease.    06/13/2017 Surgery    CLOSURE OF LOOP ILEOSTOMY Dannielle Burn, MD [facility] 06/13/2017    10/19/2017 Imaging    CT CAP IMPRESSION: Status post low anterior section with reanastomosis. Prior diverting ileostomy has been reversed.  No findings suspicious for recurrent or metastatic disease.  Trace gas in the uterine fundus, nonspecific. While not distinctly visualized on this CT, this appearance can sometimes be related to colovaginal fistula.  Additional ancillary findings as above    03/28/2018 Surgery     Duodenal Bypass reversal on 03/28/18 at [place]:  Surveillance  INTERVAL HISTORY:  Tonya [name] is here for a follow up of colon cancer. She presents to the clinic alone. She notes after her duodenal reversal she had C.Diff and has had 2 cycles of antibiotics. She is currently on probiotics and waiting for fecal transplant. She notes she has 15 BMs in the day and at night and her stool has thickened since reversal. She feels she can get to the bathroom. She has note her bottoms is sore. She notes she refuses to have colostomy and is willing to try anything.   She notes she is staying  home overall other than doctors. She notes she has been able to gain weight. She notes other than diarrhea she is doing very well.     REVIEW OF SYSTEMS:   Constitutional: Denies fevers, chills (+) weight gain  Eyes: Denies blurriness of vision Ears, nose, mouth, throat, and face: Denies mucositis or sore throat Respiratory: Denies cough, dyspnea or wheezes Cardiovascular: Denies palpitation, chest discomfort (+) b/l lower extremity swelling Gastrointestinal:  Denies nausea, heartburn (+) diarrhea 15 times in the AM and PM.  Skin: Denies abnormal skin rashes Lymphatics: Denies new lymphadenopathy or easy bruising Neurological:Denies numbness, tingling or new weaknesses Behavioral/Psych: Mood is stable, no new changes  All other systems were reviewed with the patient and are negative.  MEDICAL HISTORY:  Past Medical History:  Diagnosis Date   Coronary artery disease 07/2003   Myocardial infarction [facility])    Obesity 2003   s/p gastric bypass     SURGICAL HISTORY: Past Surgical History:  Procedure Laterality Date   CHOLECYSTECTOMY  1999   GASTRIC BYPASS  2003   IR FLUORO GUIDE PORT INSERTION RIGHT  01/25/2017   IR US GUIDE VASC ACCESS RIGHT  01/25/2017    I have reviewed the social history and family history with the patient and they are unchanged from previous note.  ALLERGIES:  is allergic to bactrim [sulfamethoxazole-trimethoprim]; phenergan [promethazine hcl]; sulfa antibiotics; and penicillins.  MEDICATIONS:  Current Outpatient Medications  Medication Sig Dispense Refill   aspirin 81 MG chewable tablet Chew 81 mg by mouth daily.     Cholecalciferol (VITAMIN D3) 2000 units capsule Take 1  capsule by mouth daily.     ferrous sulfate (FERROUSUL) 325 (65 FE) MG tablet Take 1 tablet (325 mg total) by mouth daily with breakfast. (Patient taking differently: Take 325 mg by mouth. Take two by mouth daily) 30 tablet 6   ibuprofen (ADVIL,MOTRIN) 200 MG tablet Take 200 mg  by mouth every 6 (six) hours as needed.     Multiple Vitamin (MULTIVITAMIN) tablet Take 1 tablet by mouth daily.     vancomycin (VANCOCIN) 125 MG capsule Take 125 mg by mouth 4 (four) times daily.     Current Facility-Administered Medications  Medication Dose Route Frequency Provider Last Rate Last Dose   0.9 %  sodium chloride infusion  500 mL Intravenous Continuous Irene Shipper, MD       Facility-Administered Medications Ordered in Other Visits  Medication Dose Route Frequency Provider Last Rate Last Dose   Tbo-Filgrastim (GRANIX) injection 480 mcg  480 mcg Subcutaneous Once Truitt Merle, MD        PHYSICAL EXAMINATION: ECOG PERFORMANCE STATUS: 1 - Symptomatic but completely ambulatory  Vitals:   06/05/18 1027  BP: 126/72  Pulse: 76  Resp: 18  Temp: 98.3 F (36.8 C)  SpO2: 100%   Filed Weights   06/05/18 1027  Weight: 140 lb 6.4 oz (63.7 kg)    GENERAL:alert, no distress and comfortable SKIN: skin color, texture, turgor are normal, no rashes or significant lesions EYES: normal, Conjunctiva are pink and non-injected, sclera clear  NECK: supple, thyroid normal size, non-tender, without nodularity LYMPH:  no palpable lymphadenopathy in the cervical, axillary  LUNGS: clear to auscultation and percussion with normal breathing effort HEART: regular rate & rhythm and no murmurs (+) mild lower extremity edema ABDOMEN:abdomen soft, non-tender and normal bowel sounds (+) Surgical incisions healed well, mild scar tissue  Musculoskeletal:no cyanosis of digits and no clubbing  NEURO: alert & oriented x 3 with fluent speech, no focal motor/sensory deficits  LABORATORY DATA:  I have reviewed the data as listed CBC Latest Ref Rng & Units 06/05/2018 04/04/2018 04/03/2018  WBC 4.0 - 10.5 K/uL 5.7 6.1 5.4  Hemoglobin 12.0 - 15.0 g/dL 10.7(L) 12.3 11.3(A)  Hematocrit 36.0 - 46.0 % 34.1(L) 37 35(A)  Platelets 150 - 400 K/uL 228 224 222     CMP Latest Ref Rng & Units 06/05/2018 04/06/2018  04/05/2018  Glucose 70 - 99 mg/dL 111(H) - -  BUN 8 - 23 mg/dL '19 7 10  '$ Creatinine 0.44 - 1.00 mg/dL 0.62 0.4(A) 0.4(A)  Sodium 135 - 145 mmol/L 142 135(A) 136(A)  Potassium 3.5 - 5.1 mmol/L 4.3 3.4 3.2(A)  Chloride 98 - 111 mmol/L 110 - -  CO2 22 - 32 mmol/L 26 - -  Calcium 8.9 - 10.3 mg/dL 8.7(L) - -  Total Protein 6.5 - 8.1 g/dL 6.0(L) - -  Total Bilirubin 0.3 - 1.2 mg/dL 0.2(L) - -  Alkaline Phos 38 - 126 U/L 54 - -  AST 15 - 41 U/L 35 - -  ALT 0 - 44 U/L 41 - -      RADIOGRAPHIC STUDIES: I have personally reviewed the radiological images as listed and agreed with the findings in the report. No results found.   ASSESSMENT & PLAN:  Tonya [name] is a 68 y.o. female with   1. Invasive low rectal adenocarcinoma, cT3N0M0, ypT3N1a, (+) margin, MMR normal  -Diagnosed in 2018. Completedneoadjuvantchemowith concurrentradiation, and surgery. Although she had excellent response to neoadjuvant chemoradiation, her residual tumor was still T3 and 1/12  node was positive, andradial margin was positive.She did complete 4 months adjuvant FOLFOX  -Currently on surveillance. -She underwent Duodenal Bypass reversal on 03/28/18 at [facility] for her diarrhea and malnutrition. Unfortunately afterward she developed C.Diff colitis and no change of her chronic diarrea. She has gone through 2 course of antibiotics. She is currently on probiotics and waiting for fecal transplant and coloscopy.  -Otherwise she is clinically doing well. She has been able to gain weight well. Labs reviewed, Hg 10.7, CMP  . CEA still pending. Physical exam unremarkable except mild LE edema. I encouraged her to wear compression sock and elevate her feet. If this worsens she should contact clinic.  -Continue Surveillance, plan to scan her 10/2018 -f/u in 4 months with scan   2.Diarrhea andcompletestool incontinence, rectal soreness and skin irritation -Started after herileostomy reversal surgery -She has  repeatedly refused to have another colostomy bag -She has been seen by several colorectal surgeons and physicians. It is suspected this prolonged diarrhea is due to her gastric bypass.   -She underwent Duodenal Bypass reversal on 03/28/18 at [facility]. Unfortunately afterward she developed C.Diff. She has gone through 2 cycles of antibiotics. She is currently on probiotics and waiting for fecal transplant and coloscopy. Her chronic diarrhea is not changed  -She will continue to f/u with her GI for management.   3.Anemia fromiron deficiency, surgery chronic disease. -She takes 2 iron pills a day -Hg at 10.7 today (06/05/18), Hg normal 2 months ago. -I will obtain B12 level to determine if she has absorption issue. If very low, she has the options of IV iron and B12 injections.  -if no evidence of nutritional anemia, her anemia could be related to her recent c-diff colitis   4. DM, HTN -f/u with PCP and continue recommended medications. -controlled    5. Depression -stable overall, she is depressed mainly due to her stool incontinence and chronic diarrhea     Plan -From a cancer standpoint she is clinically doing well, no concerns for recurrence -lab and flush in 2 months  -Lab and follow-up in 4 months with CT CAP a few days before    No problem-specific Assessment & Plan notes found for this encounter.   Orders Placed This Encounter  Procedures   CT Abdomen Pelvis W Contrast    Standing Status:   Future    Standing Expiration Date:   06/05/2019    Order Specific Question:   If indicated for the ordered procedure, I authorize the administration of contrast media per Radiology protocol    Answer:   Yes    Order Specific Question:   Preferred imaging location?    Answer:   [facility]    Order Specific Question:   Is Oral Contrast requested for this exam?    Answer:   Yes, Per Radiology protocol    Order Specific Question:   Radiology Contrast Protocol - do NOT  remove file path    Answer:   \[email or web address]   CT Chest W Contrast    Standing Status:   Future    Standing Expiration Date:   06/05/2019    Order Specific Question:   If indicated for the ordered procedure, I authorize the administration of contrast media per Radiology protocol    Answer:   Yes    Order Specific Question:   Preferred imaging location?    Answer:   [facility]    Order Specific Question:   Radiology Contrast Protocol - do  NOT remove file path    Answer:   [email or web address]   Ferritin    Add to blood draw today    Standing Status:   Future    Number of Occurrences:   1    Standing Expiration Date:   06/05/2019   Vitamin B12    Add to blood draw today    Standing Status:   Future    Number of Occurrences:   1    Standing Expiration Date:   06/05/2019   Folate, Serum    Add to blood draw today    Standing Status:   Future    Number of Occurrences:   1    Standing Expiration Date:   06/05/2019   All questions were answered. The patient knows to call the clinic with any problems, questions or concerns. No barriers to learning was detected. I spent 20 minutes counseling the patient face to face. The total time spent in the appointment was 25 minutes and more than 50% was on counseling and review of test results     Truitt Merle, MD 06/05/2018   I, Joslyn Devon, am acting as scribe for Truitt Merle, MD.   I have reviewed the above documentation for accuracy and completeness, and I agree with the above.

## 2018-06-05 ENCOUNTER — Inpatient Hospital Stay: Payer: Medicare HMO | Attending: Hematology

## 2018-06-05 ENCOUNTER — Other Ambulatory Visit: Payer: Self-pay

## 2018-06-05 ENCOUNTER — Inpatient Hospital Stay: Payer: Medicare HMO

## 2018-06-05 ENCOUNTER — Inpatient Hospital Stay (HOSPITAL_BASED_OUTPATIENT_CLINIC_OR_DEPARTMENT_OTHER): Payer: Medicare HMO | Admitting: Hematology

## 2018-06-05 VITALS — BP 126/72 | HR 76 | Temp 98.3°F | Resp 18 | Ht 60.0 in | Wt 140.4 lb

## 2018-06-05 DIAGNOSIS — D500 Iron deficiency anemia secondary to blood loss (chronic): Secondary | ICD-10-CM

## 2018-06-05 DIAGNOSIS — Z923 Personal history of irradiation: Secondary | ICD-10-CM | POA: Insufficient documentation

## 2018-06-05 DIAGNOSIS — R69 Illness, unspecified: Secondary | ICD-10-CM | POA: Diagnosis not present

## 2018-06-05 DIAGNOSIS — Z79899 Other long term (current) drug therapy: Secondary | ICD-10-CM

## 2018-06-05 DIAGNOSIS — C20 Malignant neoplasm of rectum: Secondary | ICD-10-CM | POA: Insufficient documentation

## 2018-06-05 DIAGNOSIS — E119 Type 2 diabetes mellitus without complications: Secondary | ICD-10-CM

## 2018-06-05 DIAGNOSIS — I10 Essential (primary) hypertension: Secondary | ICD-10-CM | POA: Insufficient documentation

## 2018-06-05 DIAGNOSIS — D649 Anemia, unspecified: Secondary | ICD-10-CM

## 2018-06-05 DIAGNOSIS — R197 Diarrhea, unspecified: Secondary | ICD-10-CM | POA: Insufficient documentation

## 2018-06-05 DIAGNOSIS — Z9221 Personal history of antineoplastic chemotherapy: Secondary | ICD-10-CM | POA: Insufficient documentation

## 2018-06-05 DIAGNOSIS — Z7982 Long term (current) use of aspirin: Secondary | ICD-10-CM

## 2018-06-05 DIAGNOSIS — Z9884 Bariatric surgery status: Secondary | ICD-10-CM

## 2018-06-05 DIAGNOSIS — C787 Secondary malignant neoplasm of liver and intrahepatic bile duct: Secondary | ICD-10-CM | POA: Insufficient documentation

## 2018-06-05 DIAGNOSIS — I252 Old myocardial infarction: Secondary | ICD-10-CM

## 2018-06-05 DIAGNOSIS — R600 Localized edema: Secondary | ICD-10-CM | POA: Insufficient documentation

## 2018-06-05 DIAGNOSIS — Z791 Long term (current) use of non-steroidal anti-inflammatories (NSAID): Secondary | ICD-10-CM

## 2018-06-05 DIAGNOSIS — F329 Major depressive disorder, single episode, unspecified: Secondary | ICD-10-CM | POA: Diagnosis not present

## 2018-06-05 DIAGNOSIS — R159 Full incontinence of feces: Secondary | ICD-10-CM | POA: Insufficient documentation

## 2018-06-05 DIAGNOSIS — Z95828 Presence of other vascular implants and grafts: Secondary | ICD-10-CM

## 2018-06-05 LAB — CMP (CANCER CENTER ONLY)
ALT: 41 U/L (ref 0–44)
AST: 35 U/L (ref 15–41)
Albumin: 3.4 g/dL — ABNORMAL LOW (ref 3.5–5.0)
Alkaline Phosphatase: 54 U/L (ref 38–126)
Anion gap: 6 (ref 5–15)
BUN: 19 mg/dL (ref 8–23)
CO2: 26 mmol/L (ref 22–32)
Calcium: 8.7 mg/dL — ABNORMAL LOW (ref 8.9–10.3)
Chloride: 110 mmol/L (ref 98–111)
Creatinine: 0.62 mg/dL (ref 0.44–1.00)
GFR, Est AFR Am: >60 mL/min (ref >60)
GFR, Estimated: >60 mL/min (ref >60)
Glucose, Bld: 111 mg/dL — ABNORMAL HIGH (ref 70–99)
Potassium: 4.3 mmol/L (ref 3.5–5.1)
Sodium: 142 mmol/L (ref 135–145)
Total Bilirubin: 0.2 mg/dL — ABNORMAL LOW (ref 0.3–1.2)
Total Protein: 6 g/dL — ABNORMAL LOW (ref 6.5–8.1)

## 2018-06-05 LAB — CBC WITH DIFFERENTIAL (CANCER CENTER ONLY)
Abs Immature Granulocytes: 0.05 10*3/uL (ref 0.00–0.07)
Basophils Absolute: 0 10*3/uL (ref 0.0–0.1)
Basophils Relative: 1 %
Eosinophils Absolute: 0.1 10*3/uL (ref 0.0–0.5)
Eosinophils Relative: 2 %
HCT: 34.1 % — ABNORMAL LOW (ref 36.0–46.0)
Hemoglobin: 10.7 g/dL — ABNORMAL LOW (ref 12.0–15.0)
Immature Granulocytes: 1 %
Lymphocytes Relative: 9 %
Lymphs Abs: 0.5 10*3/uL — ABNORMAL LOW (ref 0.7–4.0)
MCH: 31.6 pg (ref 26.0–34.0)
MCHC: 31.4 g/dL (ref 30.0–36.0)
MCV: 100.6 fL — ABNORMAL HIGH (ref 80.0–100.0)
Monocytes Absolute: 0.4 10*3/uL (ref 0.1–1.0)
Monocytes Relative: 7 %
Neutro Abs: 4.6 10*3/uL (ref 1.7–7.7)
Neutrophils Relative %: 80 %
Platelet Count: 228 10*3/uL (ref 150–400)
RBC: 3.39 MIL/uL — ABNORMAL LOW (ref 3.87–5.11)
RDW: 12.9 % (ref 11.5–15.5)
WBC Count: 5.7 10*3/uL (ref 4.0–10.5)
nRBC: 0 % (ref 0.0–0.2)

## 2018-06-05 LAB — RETICULOCYTES
Immature Retic Fract: 14.4 % (ref 2.3–15.9)
RBC.: 3.39 MIL/uL — ABNORMAL LOW (ref 3.87–5.11)
Retic Count, Absolute: 92.9 10*3/uL (ref 19.0–186.0)
Retic Ct Pct: 2.7 % (ref 0.4–3.1)

## 2018-06-05 LAB — VITAMIN B12: Vitamin B-12: 506 pg/mL (ref 180–914)

## 2018-06-05 LAB — FERRITIN: Ferritin: 75 ng/mL (ref 11–307)

## 2018-06-05 LAB — FOLATE: Folate: 46.7 ng/mL (ref >5.9)

## 2018-06-05 LAB — CEA (IN HOUSE-CHCC): CEA (CHCC-In House): 1.38 ng/mL (ref 0.00–5.00)

## 2018-06-05 MED ORDER — HEPARIN SOD (PORK) LOCK FLUSH 100 UNIT/ML IV SOLN
500.0000 [IU] | Freq: Once | INTRAVENOUS | Status: AC | PRN
  Administered 2018-06-05: 500 [IU]
  Filled 2018-06-05: qty 5

## 2018-06-05 MED ORDER — SODIUM CHLORIDE 0.9% FLUSH
10.0000 mL | Freq: Once | INTRAVENOUS | Status: AC
  Administered 2018-06-05: 10 mL
  Filled 2018-06-05: qty 10

## 2018-06-06 ENCOUNTER — Telehealth: Payer: Self-pay | Admitting: Hematology

## 2018-06-06 ENCOUNTER — Encounter: Payer: Self-pay | Admitting: Hematology

## 2018-06-06 NOTE — Telephone Encounter (Signed)
Scheduled appt per 6/1 los. ° °A calendar will be mailed out. °

## 2018-06-09 DIAGNOSIS — R159 Full incontinence of feces: Secondary | ICD-10-CM | POA: Diagnosis not present

## 2018-06-09 DIAGNOSIS — R197 Diarrhea, unspecified: Secondary | ICD-10-CM | POA: Diagnosis not present

## 2018-06-09 LAB — TSH: TSH: 1.69 (ref 0.41–5.90)

## 2018-06-28 DIAGNOSIS — K529 Noninfective gastroenteritis and colitis, unspecified: Secondary | ICD-10-CM | POA: Diagnosis not present

## 2018-06-28 DIAGNOSIS — K624 Stenosis of anus and rectum: Secondary | ICD-10-CM | POA: Diagnosis not present

## 2018-06-28 DIAGNOSIS — Z85048 Personal history of other malignant neoplasm of rectum, rectosigmoid junction, and anus: Secondary | ICD-10-CM | POA: Diagnosis not present

## 2018-06-28 DIAGNOSIS — R197 Diarrhea, unspecified: Secondary | ICD-10-CM | POA: Diagnosis not present

## 2018-06-28 DIAGNOSIS — A0471 Enterocolitis due to Clostridium difficile, recurrent: Secondary | ICD-10-CM | POA: Diagnosis not present

## 2018-06-28 DIAGNOSIS — Z85038 Personal history of other malignant neoplasm of large intestine: Secondary | ICD-10-CM | POA: Diagnosis not present

## 2018-06-28 DIAGNOSIS — K9189 Other postprocedural complications and disorders of digestive system: Secondary | ICD-10-CM | POA: Diagnosis not present

## 2018-06-28 DIAGNOSIS — K9089 Other intestinal malabsorption: Secondary | ICD-10-CM | POA: Diagnosis not present

## 2018-06-28 LAB — HM COLONOSCOPY

## 2018-06-29 DIAGNOSIS — A0472 Enterocolitis due to Clostridium difficile, not specified as recurrent: Secondary | ICD-10-CM | POA: Diagnosis not present

## 2018-06-29 DIAGNOSIS — K909 Intestinal malabsorption, unspecified: Secondary | ICD-10-CM | POA: Diagnosis not present

## 2018-06-29 DIAGNOSIS — R197 Diarrhea, unspecified: Secondary | ICD-10-CM | POA: Diagnosis not present

## 2018-06-29 DIAGNOSIS — Z9884 Bariatric surgery status: Secondary | ICD-10-CM | POA: Diagnosis not present

## 2018-07-12 DIAGNOSIS — R197 Diarrhea, unspecified: Secondary | ICD-10-CM | POA: Diagnosis not present

## 2018-07-12 DIAGNOSIS — A0472 Enterocolitis due to Clostridium difficile, not specified as recurrent: Secondary | ICD-10-CM | POA: Diagnosis not present

## 2018-07-25 ENCOUNTER — Encounter: Payer: Self-pay | Admitting: Family Medicine

## 2018-07-25 NOTE — Progress Notes (Signed)
[facility] Eno/thx dmf

## 2018-07-26 DIAGNOSIS — A0472 Enterocolitis due to Clostridium difficile, not specified as recurrent: Secondary | ICD-10-CM | POA: Diagnosis not present

## 2018-07-26 DIAGNOSIS — R197 Diarrhea, unspecified: Secondary | ICD-10-CM | POA: Diagnosis not present

## 2018-08-07 ENCOUNTER — Inpatient Hospital Stay: Payer: Medicare HMO | Attending: Hematology

## 2018-08-07 ENCOUNTER — Inpatient Hospital Stay: Payer: Medicare HMO

## 2018-08-07 ENCOUNTER — Other Ambulatory Visit: Payer: Self-pay

## 2018-08-07 DIAGNOSIS — Z9884 Bariatric surgery status: Secondary | ICD-10-CM | POA: Insufficient documentation

## 2018-08-07 DIAGNOSIS — E119 Type 2 diabetes mellitus without complications: Secondary | ICD-10-CM | POA: Diagnosis not present

## 2018-08-07 DIAGNOSIS — F329 Major depressive disorder, single episode, unspecified: Secondary | ICD-10-CM | POA: Insufficient documentation

## 2018-08-07 DIAGNOSIS — I10 Essential (primary) hypertension: Secondary | ICD-10-CM | POA: Diagnosis not present

## 2018-08-07 DIAGNOSIS — Z923 Personal history of irradiation: Secondary | ICD-10-CM | POA: Diagnosis not present

## 2018-08-07 DIAGNOSIS — R197 Diarrhea, unspecified: Secondary | ICD-10-CM | POA: Diagnosis not present

## 2018-08-07 DIAGNOSIS — Z79899 Other long term (current) drug therapy: Secondary | ICD-10-CM | POA: Diagnosis not present

## 2018-08-07 DIAGNOSIS — Z9221 Personal history of antineoplastic chemotherapy: Secondary | ICD-10-CM | POA: Diagnosis not present

## 2018-08-07 DIAGNOSIS — R69 Illness, unspecified: Secondary | ICD-10-CM | POA: Diagnosis not present

## 2018-08-07 DIAGNOSIS — D500 Iron deficiency anemia secondary to blood loss (chronic): Secondary | ICD-10-CM

## 2018-08-07 DIAGNOSIS — D509 Iron deficiency anemia, unspecified: Secondary | ICD-10-CM | POA: Diagnosis not present

## 2018-08-07 DIAGNOSIS — C20 Malignant neoplasm of rectum: Secondary | ICD-10-CM | POA: Diagnosis present

## 2018-08-07 DIAGNOSIS — Z95828 Presence of other vascular implants and grafts: Secondary | ICD-10-CM

## 2018-08-07 LAB — CBC WITH DIFFERENTIAL (CANCER CENTER ONLY)
Abs Immature Granulocytes: 0.07 10*3/uL (ref 0.00–0.07)
Basophils Absolute: 0 10*3/uL (ref 0.0–0.1)
Basophils Relative: 1 %
Eosinophils Absolute: 0.1 10*3/uL (ref 0.0–0.5)
Eosinophils Relative: 2 %
HCT: 35.7 % — ABNORMAL LOW (ref 36.0–46.0)
Hemoglobin: 11.3 g/dL — ABNORMAL LOW (ref 12.0–15.0)
Immature Granulocytes: 1 %
Lymphocytes Relative: 11 %
Lymphs Abs: 0.7 10*3/uL (ref 0.7–4.0)
MCH: 30.6 pg (ref 26.0–34.0)
MCHC: 31.7 g/dL (ref 30.0–36.0)
MCV: 96.7 fL (ref 80.0–100.0)
Monocytes Absolute: 0.4 10*3/uL (ref 0.1–1.0)
Monocytes Relative: 7 %
Neutro Abs: 4.9 10*3/uL (ref 1.7–7.7)
Neutrophils Relative %: 78 %
Platelet Count: 219 10*3/uL (ref 150–400)
RBC: 3.69 MIL/uL — ABNORMAL LOW (ref 3.87–5.11)
RDW: 12.4 % (ref 11.5–15.5)
WBC Count: 6.2 10*3/uL (ref 4.0–10.5)
nRBC: 0 % (ref 0.0–0.2)

## 2018-08-07 LAB — CMP (CANCER CENTER ONLY)
ALT: 21 U/L (ref 0–44)
AST: 20 U/L (ref 15–41)
Albumin: 3.5 g/dL (ref 3.5–5.0)
Alkaline Phosphatase: 58 U/L (ref 38–126)
Anion gap: 7 (ref 5–15)
BUN: 22 mg/dL (ref 8–23)
CO2: 22 mmol/L (ref 22–32)
Calcium: 9.1 mg/dL (ref 8.9–10.3)
Chloride: 111 mmol/L (ref 98–111)
Creatinine: 0.76 mg/dL (ref 0.44–1.00)
GFR, Est AFR Am: >60 mL/min (ref >60)
GFR, Estimated: >60 mL/min (ref >60)
Glucose, Bld: 100 mg/dL — ABNORMAL HIGH (ref 70–99)
Potassium: 4.6 mmol/L (ref 3.5–5.1)
Sodium: 140 mmol/L (ref 135–145)
Total Bilirubin: 0.2 mg/dL — ABNORMAL LOW (ref 0.3–1.2)
Total Protein: 6.4 g/dL — ABNORMAL LOW (ref 6.5–8.1)

## 2018-08-07 LAB — RETICULOCYTES
Immature Retic Fract: 18.7 % — ABNORMAL HIGH (ref 2.3–15.9)
RBC.: 3.69 MIL/uL — ABNORMAL LOW (ref 3.87–5.11)
Retic Count, Absolute: 62 10*3/uL (ref 19.0–186.0)
Retic Ct Pct: 1.7 % (ref 0.4–3.1)

## 2018-08-07 MED ORDER — HEPARIN SOD (PORK) LOCK FLUSH 100 UNIT/ML IV SOLN
500.0000 [IU] | Freq: Once | INTRAVENOUS | Status: AC | PRN
  Administered 2018-08-07: 500 [IU]
  Filled 2018-08-07: qty 5

## 2018-08-07 MED ORDER — SODIUM CHLORIDE 0.9% FLUSH
10.0000 mL | INTRAVENOUS | Status: DC | PRN
  Administered 2018-08-07: 10 mL
  Filled 2018-08-07: qty 10

## 2018-08-09 ENCOUNTER — Telehealth: Payer: Self-pay | Admitting: *Deleted

## 2018-08-09 NOTE — Telephone Encounter (Signed)
-----   Message from Alla Feeling, NP sent at 08/08/2018  4:45 PM EDT ----- Please let her know anemia is improving. Will continue monitoring.  Thanks, Regan Rakers

## 2018-08-09 NOTE — Telephone Encounter (Signed)
Called pt & informed that anemia is improving.  Hgb/Hct improved but will cont to monitor.  Pt expressed appreciation.

## 2018-08-23 DIAGNOSIS — K529 Noninfective gastroenteritis and colitis, unspecified: Secondary | ICD-10-CM | POA: Diagnosis not present

## 2018-08-29 DIAGNOSIS — K529 Noninfective gastroenteritis and colitis, unspecified: Secondary | ICD-10-CM | POA: Diagnosis not present

## 2018-08-29 DIAGNOSIS — R197 Diarrhea, unspecified: Secondary | ICD-10-CM | POA: Diagnosis not present

## 2018-08-29 DIAGNOSIS — R159 Full incontinence of feces: Secondary | ICD-10-CM | POA: Diagnosis not present

## 2018-09-14 DIAGNOSIS — K591 Functional diarrhea: Secondary | ICD-10-CM | POA: Diagnosis not present

## 2018-09-27 DIAGNOSIS — R69 Illness, unspecified: Secondary | ICD-10-CM | POA: Diagnosis not present

## 2018-09-29 ENCOUNTER — Encounter: Payer: Self-pay | Admitting: Hematology

## 2018-10-02 ENCOUNTER — Inpatient Hospital Stay: Payer: Medicare HMO

## 2018-10-02 ENCOUNTER — Ambulatory Visit (HOSPITAL_COMMUNITY)
Admission: RE | Admit: 2018-10-02 | Discharge: 2018-10-02 | Disposition: A | Discharge status: Home / Self Care | Payer: Medicare HMO | Source: Ambulatory Visit | Attending: Hematology | Admitting: Hematology

## 2018-10-02 ENCOUNTER — Inpatient Hospital Stay: Payer: Medicare HMO | Attending: Hematology

## 2018-10-02 ENCOUNTER — Other Ambulatory Visit: Payer: Self-pay

## 2018-10-02 VITALS — BP 128/72 | HR 72 | Temp 98.2°F | Resp 18

## 2018-10-02 DIAGNOSIS — Z23 Encounter for immunization: Secondary | ICD-10-CM

## 2018-10-02 DIAGNOSIS — C20 Malignant neoplasm of rectum: Secondary | ICD-10-CM | POA: Diagnosis present

## 2018-10-02 DIAGNOSIS — J439 Emphysema, unspecified: Secondary | ICD-10-CM | POA: Diagnosis not present

## 2018-10-02 DIAGNOSIS — D500 Iron deficiency anemia secondary to blood loss (chronic): Secondary | ICD-10-CM

## 2018-10-02 LAB — CBC WITH DIFFERENTIAL/PLATELET
Abs Immature Granulocytes: 0.04 10*3/uL (ref 0.00–0.07)
Basophils Absolute: 0 10*3/uL (ref 0.0–0.1)
Basophils Relative: 0 %
Eosinophils Absolute: 0.1 10*3/uL (ref 0.0–0.5)
Eosinophils Relative: 2 %
HCT: 35.5 % — ABNORMAL LOW (ref 36.0–46.0)
Hemoglobin: 11.4 g/dL — ABNORMAL LOW (ref 12.0–15.0)
Immature Granulocytes: 1 %
Lymphocytes Relative: 11 %
Lymphs Abs: 0.6 10*3/uL — ABNORMAL LOW (ref 0.7–4.0)
MCH: 30.3 pg (ref 26.0–34.0)
MCHC: 32.1 g/dL (ref 30.0–36.0)
MCV: 94.4 fL (ref 80.0–100.0)
Monocytes Absolute: 0.5 10*3/uL (ref 0.1–1.0)
Monocytes Relative: 9 %
Neutro Abs: 4.4 10*3/uL (ref 1.7–7.7)
Neutrophils Relative %: 77 %
Platelets: 197 10*3/uL (ref 150–400)
RBC: 3.76 MIL/uL — ABNORMAL LOW (ref 3.87–5.11)
RDW: 14.3 % (ref 11.5–15.5)
WBC: 5.7 10*3/uL (ref 4.0–10.5)
nRBC: 0 % (ref 0.0–0.2)

## 2018-10-02 LAB — CMP (CANCER CENTER ONLY)
ALT: 17 U/L (ref 0–44)
AST: 19 U/L (ref 15–41)
Albumin: 3.8 g/dL (ref 3.5–5.0)
Alkaline Phosphatase: 56 U/L (ref 38–126)
Anion gap: 10 (ref 5–15)
BUN: 18 mg/dL (ref 8–23)
CO2: 23 mmol/L (ref 22–32)
Calcium: 8.9 mg/dL (ref 8.9–10.3)
Chloride: 106 mmol/L (ref 98–111)
Creatinine: 0.7 mg/dL (ref 0.44–1.00)
GFR, Est AFR Am: >60 mL/min (ref >60)
GFR, Estimated: >60 mL/min (ref >60)
Glucose, Bld: 103 mg/dL — ABNORMAL HIGH (ref 70–99)
Potassium: 4.5 mmol/L (ref 3.5–5.1)
Sodium: 139 mmol/L (ref 135–145)
Total Bilirubin: 0.4 mg/dL (ref 0.3–1.2)
Total Protein: 6.6 g/dL (ref 6.5–8.1)

## 2018-10-02 LAB — RETICULOCYTES
Immature Retic Fract: 26.8 % — ABNORMAL HIGH (ref 2.3–15.9)
RBC.: 3.76 MIL/uL — ABNORMAL LOW (ref 3.87–5.11)
Retic Count, Absolute: 92.5 10*3/uL (ref 19.0–186.0)
Retic Ct Pct: 2.5 % (ref 0.4–3.1)

## 2018-10-02 MED ORDER — SODIUM CHLORIDE (PF) 0.9 % IJ SOLN
INTRAMUSCULAR | Status: AC
  Filled 2018-10-02: qty 50

## 2018-10-02 MED ORDER — HEPARIN SOD (PORK) LOCK FLUSH 100 UNIT/ML IV SOLN
500.0000 [IU] | Freq: Once | INTRAVENOUS | Status: AC
  Administered 2018-10-02: 500 [IU] via INTRAVENOUS

## 2018-10-02 MED ORDER — INFLUENZA VAC A&B SA ADJ QUAD 0.5 ML IM PRSY
0.5000 mL | PREFILLED_SYRINGE | INTRAMUSCULAR | Status: AC
  Administered 2018-10-02: 0.5 mL via INTRAMUSCULAR

## 2018-10-02 MED ORDER — HEPARIN SOD (PORK) LOCK FLUSH 100 UNIT/ML IV SOLN
INTRAVENOUS | Status: AC
  Filled 2018-10-02: qty 5

## 2018-10-02 MED ORDER — IOHEXOL 300 MG/ML  SOLN
100.0000 mL | Freq: Once | INTRAMUSCULAR | Status: AC | PRN
  Administered 2018-10-02: 100 mL via INTRAVENOUS

## 2018-10-02 MED ORDER — INFLUENZA VAC A&B SA ADJ QUAD 0.5 ML IM PRSY
PREFILLED_SYRINGE | INTRAMUSCULAR | Status: AC
  Filled 2018-10-02: qty 0.5

## 2018-10-03 NOTE — Progress Notes (Addendum)
[place]   [phone] Fax:(336) [phone]   Clinic Follow up Note   Patient Care Team: Lucille Passy, MD as PCP - General (Family Medicine) Irene Shipper, MD as Consulting Physician (Gastroenterology) Leighton Ruff, MD as Consulting Physician (General Surgery) Truitt Merle, MD as Consulting Physician (Hematology) Kyung Rudd, MD as Consulting Physician (Radiation Oncology)  Date of Service:  10/05/2018  CHIEF COMPLAINT: F/u on colon cancer  SUMMARY OF ONCOLOGIC HISTORY: Oncology History Overview Note  Cancer Staging Rectal adenocarcinoma [facility] Staging form: Colon and Rectum, AJCC 8th Edition - Clinical stage from 07/09/2016: Stage IIA (cT3, cN0, cM0) - Signed by Truitt Merle, MD on 08/16/2016     Rectal adenocarcinoma ([place])  07/09/2016 Pathology Results   Diagnosis Surgical [P], rectum mass - INVASIVE ADENOCARCINOMA. - SEE COMMENT.   07/09/2016 Procedure   Colonoscopy A non-obstructing large friable mass was found in the distal rectum. This began at approximately 1 cm above the anal verge and extended proximal for a distance of 8-10 cm. The lesion was bulky and involved approximately 75% of the luminal circumference. Multiple biopsies were taken. A few small-mouthed diverticula were found in the left colon. The exam was otherwise without abnormality on direct views. Retroflexion not performed purposely.   07/12/2016 Imaging   CT C/A/P with contrast IMPRESSION: Rectal wall thickening/mass, compatible with known rectal adenocarcinoma. No findings specific for metastatic disease in the chest, abdomen, or pelvis. 2.2 cm probable hemangioma in segment 4B, although poorly evaluated. Consider MRI abdomen with/ without multihance contrast for definitive characterization, as clinically warranted. Bilateral adrenal nodules measuring up to 1.6 cm on the right, likely reflecting benign adrenal adenomas, although technically indeterminate. If MRI abdomen is performed,  these can be definitively characterized at that time.   07/20/2016 Initial Diagnosis   Rectal adenocarcinoma ([place])   08/03/2016 Imaging   MRI AP W WO Contrast 08/03/16 IMPRESSION: 1. Large rectal mass measures 6.1 cm in length. No obstruction identified. This is compatible with at least a T3bN0M0 lesion. 2. No specific features highly suspicious for nodal metastasis or metastatic disease to the upper abdomen. 3. Indeterminate arterial phase enhancing lesion within the anterior dome of liver measures less than 1 cm. This may represent a benign liver lesions such as FNH or adenoma. Less favored with the hypervascular liver metastasis. Followup imaging at 6 months with repeat MRI of the liver is advise. 4. Bilateral adrenal adenomas   08/04/2016 - 09/16/2016 Radiation Therapy   Concurrent chemo radiation with Dr. [name]   08/04/2016 - 09/16/2016 Chemotherapy   Concurrent chemo radiation with Xeloda, '1500mg'$  twice daily   12/06/2016 Surgery   ULTRA LOW ANTERIOR RESECTION OF SIGMOID COLON AND RECTUM WITH COLOANAL ANASTAMOSIS AND LOOP ILEOSTOMY  by Dr. Drue Flirt at [facility]  12/06/16   12/06/2016 Pathology Results   FINAL PATHOLOGIC DIAGNOSIS 12/06/16 MICROSCOPIC EXAMINATION AND DIAGNOSIS  A.ANAL CANAL, MUCOSECTOMY: Benign rectal and fibroadipose tissue. Negative for malignancy.  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 Mucinous tumor focally invades through the muscularis propria into pericolorectal tissue. Radial margin interpreted as involved by invasive carcinoma (invasive acellular mucin withrare viable tumor cells focally comes to within 0.2 mm [0.02 cm] of the inked radial soft tissue margin). Treatment effect present, predominantly invasive acellular mucin associated with  only very rare small groups of viable tumor cells (near complete response). 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 AJCC Pathologic Stage: ypT3 pN1a. See Cancer Case Summary.  C.POSTERIOR ANAL CANAL, EXCISION: Benign rectal tissue. Negative for malignancy.   01/26/2017 - 05/04/2017 Chemotherapy   Adjuvant FOLFOX q2 weeks, for 8 cycles         04/04/2017 Imaging   CT CAP IMPRESSION: New complete interval resolution of bulky rectal soft tissue mass since prior exam.  No evidence of local or distant metastatic disease.   06/13/2017 Surgery   CLOSURE OF LOOP ILEOSTOMY Dannielle Burn, MD [facility] 06/13/2017   10/19/2017 Imaging   CT CAP IMPRESSION: Status post low anterior section with reanastomosis. Prior diverting ileostomy has been reversed.  No findings suspicious for recurrent or metastatic disease.  Trace gas in the uterine fundus, nonspecific. While not distinctly visualized on this CT, this appearance can sometimes be related to colovaginal fistula.  Additional ancillary findings as above   03/28/2018 Surgery    Duodenal Bypass reversal on 03/28/18 at [facility]   10/02/2018 Imaging   CT CAP W Contrast  IMPRESSION: 1. Redemonstrated postoperative findings of rectal resection and reanastomosis. No evidence of recurrent mass or abnormal soft tissue in the pelvis.   2. No evidence of metastatic disease in the chest, abdomen, or pelvis.   3. There is a new, lobulated soft tissue nodule in the superficial left gluteal soft tissues measuring 3.0 x 2.2 cm (series 2, image 91). This is likely related to injection or trauma, and a very unlikely manifestation of metastatic disease. This can be further evaluated by ultrasound if desired.   4.  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 This can be further interrogated by fluoroscopy if desired.   5. Coronary artery disease. Aortic Atherosclerosis (ICD10-I70.0) and Emphysema (ICD10-J43.9).      CURRENT THERAPY:  Surveillance  INTERVAL HISTORY:  Tonya [name] is here for a follow up of colon cancer. She presents to the clinic alone. She notes she is stable. She still has diarrhea. She notes she is doing otherwise well. She denies suicidal thoughts but no longer wants to live. She denies any left buttock skin injury or change. She denies any pain. She notes she is able to eat adequately and was able to gain weight. She notes intermittent swelling of b/l LE. She notes this can go up her legs.  She notes she is no longer seeing nay physicians other than Med Onc. She has not see her PCP in 3 years. She notes she is not on any prescription medications.     REVIEW OF SYSTEMS:   Constitutional: Denies fevers, chills or abnormal weight loss Eyes: Denies blurriness of vision Ears, nose, mouth, throat, and face: Denies mucositis or sore throat Respiratory: Denies cough, dyspnea or wheezes Cardiovascular: Denies palpitation, chest discomfort (+) Intermittent lower extremity swelling Gastrointestinal:  Denies nausea, heartburn (+) Chronic diarrhea  Skin: Denies abnormal skin rashes Lymphatics: Denies new lymphadenopathy or easy bruising Neurological:Denies numbness, tingling or new weaknesses Behavioral/Psych: Mood is stable, no new changes (+) Does not with to live, but not suicidal.  All other systems were reviewed with the patient and are negative.  MEDICAL HISTORY:  Past Medical History:  Diagnosis Date  . Coronary artery disease 07/2003  . Myocardial infarction ([place])   . Obesity 2003   s/p gastric bypass     SURGICAL HISTORY: Past Surgical History:  Procedure  Laterality Date  . CHOLECYSTECTOMY  1999  .  GASTRIC BYPASS  2003  . IR FLUORO GUIDE PORT INSERTION RIGHT  01/25/2017  . IR US GUIDE VASC ACCESS RIGHT  01/25/2017    I have reviewed the social history and family history with the patient and they are unchanged from previous note.  ALLERGIES:  is allergic to bactrim [sulfamethoxazole-trimethoprim]; phenergan [promethazine hcl]; sulfa antibiotics; and penicillins.  MEDICATIONS:  Current Outpatient Medications  Medication Sig Dispense Refill  . aspirin 81 MG chewable tablet Chew 81 mg by mouth daily.    . Cholecalciferol (VITAMIN D3) 2000 units capsule Take 1 capsule by mouth daily.    . ferrous sulfate (FERROUSUL) 325 (65 FE) MG tablet Take 1 tablet (325 mg total) by mouth daily with breakfast. (Patient taking differently: Take 325 mg by mouth. Take two by mouth daily) 30 tablet 6  . ibuprofen (ADVIL,MOTRIN) 200 MG tablet Take 200 mg by mouth every 6 (six) hours as needed.    . Multiple Vitamin (MULTIVITAMIN) tablet Take 1 tablet by mouth daily.    . diphenoxylate-atropine (LOMOTIL) 2.5-0.025 MG tablet Take 2 tablets by mouth 4 (four) times daily as needed for diarrhea or loose stools. 90 tablet 1   Current Facility-Administered Medications  Medication Dose Route Frequency Provider Last Rate Last Dose  . 0.9 %  sodium chloride infusion  500 mL Intravenous Continuous Irene Shipper, MD       Facility-Administered Medications Ordered in Other Visits  Medication Dose Route Frequency Provider Last Rate Last Dose  . Tbo-Filgrastim (GRANIX) injection 480 mcg  480 mcg Subcutaneous Once Truitt Merle, MD        PHYSICAL EXAMINATION: ECOG PERFORMANCE STATUS: 0 - Asymptomatic  Vitals:   10/05/18 1039  BP: 140/80  Pulse: 79  Resp: 17  Temp: 98.3 F (36.8 C)  SpO2: 100%   Filed Weights   10/05/18 1039  Weight: 155 lb 14.4 oz (70.7 kg)    GENERAL:alert, no distress and comfortable SKIN: skin color, texture, turgor are normal, no rashes or significant lesions EYES: normal, Conjunctiva  are pink and non-injected, sclera clear  NECK: supple, thyroid normal size, non-tender, without nodularity LYMPH:  no palpable lymphadenopathy in the cervical, axillary  LUNGS: clear to auscultation and percussion with normal breathing effort HEART: regular rate & rhythm and no murmurs (+) b/l lower extremity edema, mild erythema ABDOMEN:abdomen soft, non-tender and normal bowel sounds Musculoskeletal:no cyanosis of digits and no clubbing  NEURO: alert & oriented x 3 with fluent speech, no focal motor/sensory deficits  LABORATORY DATA:  I have reviewed the data as listed CBC Latest Ref Rng & Units 10/02/2018 08/07/2018 06/05/2018  WBC 4.0 - 10.5 K/uL 5.7 6.2 5.7  Hemoglobin 12.0 - 15.0 g/dL 11.4(L) 11.3(L) 10.7(L)  Hematocrit 36.0 - 46.0 % 35.5(L) 35.7(L) 34.1(L)  Platelets 150 - 400 K/uL 197 219 228     CMP Latest Ref Rng & Units 10/02/2018 08/07/2018 06/05/2018  Glucose 70 - 99 mg/dL 103(H) 100(H) 111(H)  BUN 8 - 23 mg/dL '18 22 19  '$ Creatinine 0.44 - 1.00 mg/dL 0.70 0.76 0.62  Sodium 135 - 145 mmol/L 139 140 142  Potassium 3.5 - 5.1 mmol/L 4.5 4.6 4.3  Chloride 98 - 111 mmol/L 106 111 110  CO2 22 - 32 mmol/L '23 22 26  '$ Calcium 8.9 - 10.3 mg/dL 8.9 9.1 8.7(L)  Total Protein 6.5 - 8.1 g/dL 6.6 6.4(L) 6.0(L)  Total Bilirubin 0.3 - 1.2 mg/dL 0.4 0.2(L) 0.2(L)  Alkaline Phos  38 - 126 U/L 56 58 54  AST 15 - 41 U/L 19 20 35  ALT 0 - 44 U/L 17 21 41      RADIOGRAPHIC STUDIES: I have personally reviewed the radiological images as listed and agreed with the findings in the report. No results found.   ASSESSMENT & PLAN:  Tonya [name] is a 68 y.o. female with   1. Invasive low rectal adenocarcinoma, cT3N0M0, ypT3N1a, (+) margin, MMR normal  -Diagnosed in 2018. Completedneoadjuvantchemowith concurrentradiation, and surgery. Although she had excellent response to neoadjuvant chemoradiation, her residual tumor was still T3 and 1/12 node was positive, andradial margin was  positive.She did complete 4 months adjuvant FOLFOX  -Currently on 5 year surveillance.  -She underwent Duodenal Bypass reversal on 03/28/18 at [facility] for her diarrhea and malnutrition without any change of her chronic diarrhea without success.  -I personally reviewed and discussed her CT CAP from 10/02/18 which shows which shows postoperative changes. No evidence of metastatic disease in the chest, abdomen, pelvis. Scan does show new 3cm lobulated soft tissue nodule in left glute  -Physical exam shows possible venous stasis with mild LE edema and skin erythema. No buttock abnormalities or masses. Labs reviewed from this week, CBC and CMP WNL except Hg 11.4, BG 103. Retic panel mostly normal.  -Will review scan with IR to determine if she needs [place] or biopsy for better evaluation.  -She is 2 years since her diagnosis. She is fine to remove her PAC after elation for gluteal soft tissue nodule. She is agreeable.  -I discussed as she continue with surveillance she can start being seen by a PCP for her general health.  -F/u in 6 months  -She has already received her flu shot this year.   2.Diarrhea andcompletestool incontinence, rectal soreness and skin irritation -Started after herileostomy reversal surgery -She has repeatedly refused to have another colostomy bag -She has been seen byseveralcolorectal surgeonsand physicians. It is suspected this prolonged diarrhea is due to her gastric bypass.  -She underwent Duodenal Bypass reversal on 03/28/18 at [facility] without success. Afterward she developed C.Diff. She has gone through 2 cycles of antibiotics. She will continue to f/u with her GI for management.  -Her chronic diarrhea is not changed and still impacting her life. I will refill her Lomotil to try again (10/05/18)  3.Anemia fromiron deficiency, secondary chronic disease -She takes 2 iron pills a day -B12 level normal on 06/05/18 and no evidence of nutritional anemia, her anemia  could be related to her recent c-diff colitis  -Hg at 11.4 this week   4. DM, HTN -No on medications, well controlled   5. Depression -stable overall, she is depressed mainly due to herstool incontinenceand chronic diarrhea  -She does not feel like living given how her diarrhea impacts her life. She denies having suicidal thoughts.    6. B/l LE edema -She notes recent intermittent LE edema.  -on exam she has mild LE edema with skin erythema, not warm to touch. I discussed this could be venous stasis -I encouraged her to elevated her feet with sitting and to wear compression sock to help blood flow.     Plan -I refilled lomotil today  -lab and scan reviewed, NED, except a indeterminate soft tissue mass at left glutea  -Refer to IR for port removal and [place] for possible gluteal mass biopsy.  -Lab and f/u in 6 months, or sooner if needed    No problem-specific Assessment & Plan notes found  for this encounter.   Orders Placed This Encounter  Procedures  . IR Removal Tun Access W/ Port W/O FL    Standing Status:   Future    Standing Expiration Date:   12/05/2019    Order Specific Question:   Reason for exam:    Answer:   removal of port, no need anymore    Order Specific Question:   Preferred Imaging Location?    Answer:   [facility]  . [place] CORE BIOPSY (SOFT TISSUE)    Seen on recent CT scan, if benign appearing on [place], ok to cancel biopsy    Standing Status:   Future    Standing Expiration Date:   12/05/2019    Order Specific Question:   Lab orders requested (DO NOT place separate lab orders, these will be automatically ordered during procedure specimen collection):    Answer:   Surgical Pathology    Order Specific Question:   Reason for Exam (SYMPTOM  OR DIAGNOSIS REQUIRED)    Answer:   left gluteal mass, rule out metastasis    Order Specific Question:   Preferred location?    Answer:   [facility]   All questions were answered. The patient knows to  call the clinic with any problems, questions or concerns. No barriers to learning was detected. I spent 20 minutes counseling the patient face to face. The total time spent in the appointment was 25 minutes and more than 50% was on counseling and review of test results     Truitt Merle, MD 10/05/2018   I, Joslyn Devon, am acting as scribe for Truitt Merle, MD.   I have reviewed the above documentation for accuracy and completeness, and I agree with the above.    Addendum  Pt told me today that she had octreotide injection in the left buttock at the gynecologist office 2 months ago.  I reviewed her CT with Dr. Kathlene Cote, and we think her left gluteal soft tissue mass corresponds to her previous injection.  No further ultrasound biopsy is needed. I informed pt.  Truitt Merle  10/06/2018

## 2018-10-05 ENCOUNTER — Encounter: Payer: Self-pay | Admitting: Hematology

## 2018-10-05 ENCOUNTER — Inpatient Hospital Stay: Payer: Medicare HMO | Attending: Hematology | Admitting: Hematology

## 2018-10-05 ENCOUNTER — Other Ambulatory Visit: Payer: Self-pay

## 2018-10-05 VITALS — BP 140/80 | HR 79 | Temp 98.3°F | Resp 17 | Ht 60.0 in | Wt 155.9 lb

## 2018-10-05 DIAGNOSIS — E611 Iron deficiency: Secondary | ICD-10-CM | POA: Diagnosis not present

## 2018-10-05 DIAGNOSIS — E119 Type 2 diabetes mellitus without complications: Secondary | ICD-10-CM | POA: Insufficient documentation

## 2018-10-05 DIAGNOSIS — E46 Unspecified protein-calorie malnutrition: Secondary | ICD-10-CM | POA: Insufficient documentation

## 2018-10-05 DIAGNOSIS — K529 Noninfective gastroenteritis and colitis, unspecified: Secondary | ICD-10-CM | POA: Insufficient documentation

## 2018-10-05 DIAGNOSIS — Z9049 Acquired absence of other specified parts of digestive tract: Secondary | ICD-10-CM | POA: Insufficient documentation

## 2018-10-05 DIAGNOSIS — D500 Iron deficiency anemia secondary to blood loss (chronic): Secondary | ICD-10-CM

## 2018-10-05 DIAGNOSIS — R222 Localized swelling, mass and lump, trunk: Secondary | ICD-10-CM | POA: Insufficient documentation

## 2018-10-05 DIAGNOSIS — Z888 Allergy status to other drugs, medicaments and biological substances status: Secondary | ICD-10-CM | POA: Diagnosis not present

## 2018-10-05 DIAGNOSIS — I2510 Atherosclerotic heart disease of native coronary artery without angina pectoris: Secondary | ICD-10-CM | POA: Insufficient documentation

## 2018-10-05 DIAGNOSIS — C20 Malignant neoplasm of rectum: Secondary | ICD-10-CM | POA: Insufficient documentation

## 2018-10-05 DIAGNOSIS — K909 Intestinal malabsorption, unspecified: Secondary | ICD-10-CM | POA: Diagnosis not present

## 2018-10-05 DIAGNOSIS — Z880 Allergy status to penicillin: Secondary | ICD-10-CM | POA: Insufficient documentation

## 2018-10-05 DIAGNOSIS — F329 Major depressive disorder, single episode, unspecified: Secondary | ICD-10-CM | POA: Insufficient documentation

## 2018-10-05 DIAGNOSIS — I700 Atherosclerosis of aorta: Secondary | ICD-10-CM | POA: Insufficient documentation

## 2018-10-05 DIAGNOSIS — J439 Emphysema, unspecified: Secondary | ICD-10-CM | POA: Diagnosis not present

## 2018-10-05 DIAGNOSIS — Z881 Allergy status to other antibiotic agents status: Secondary | ICD-10-CM | POA: Insufficient documentation

## 2018-10-05 DIAGNOSIS — R197 Diarrhea, unspecified: Secondary | ICD-10-CM | POA: Diagnosis not present

## 2018-10-05 DIAGNOSIS — M7989 Other specified soft tissue disorders: Secondary | ICD-10-CM | POA: Diagnosis not present

## 2018-10-05 DIAGNOSIS — I252 Old myocardial infarction: Secondary | ICD-10-CM | POA: Diagnosis not present

## 2018-10-05 DIAGNOSIS — C787 Secondary malignant neoplasm of liver and intrahepatic bile duct: Secondary | ICD-10-CM | POA: Insufficient documentation

## 2018-10-05 DIAGNOSIS — Z9221 Personal history of antineoplastic chemotherapy: Secondary | ICD-10-CM | POA: Insufficient documentation

## 2018-10-05 DIAGNOSIS — Z882 Allergy status to sulfonamides status: Secondary | ICD-10-CM | POA: Diagnosis not present

## 2018-10-05 DIAGNOSIS — I10 Essential (primary) hypertension: Secondary | ICD-10-CM | POA: Diagnosis not present

## 2018-10-05 DIAGNOSIS — R600 Localized edema: Secondary | ICD-10-CM | POA: Diagnosis not present

## 2018-10-05 DIAGNOSIS — E669 Obesity, unspecified: Secondary | ICD-10-CM | POA: Insufficient documentation

## 2018-10-05 DIAGNOSIS — Z923 Personal history of irradiation: Secondary | ICD-10-CM | POA: Diagnosis not present

## 2018-10-05 DIAGNOSIS — Z79899 Other long term (current) drug therapy: Secondary | ICD-10-CM | POA: Diagnosis not present

## 2018-10-05 DIAGNOSIS — Z9884 Bariatric surgery status: Secondary | ICD-10-CM | POA: Insufficient documentation

## 2018-10-05 DIAGNOSIS — D638 Anemia in other chronic diseases classified elsewhere: Secondary | ICD-10-CM | POA: Insufficient documentation

## 2018-10-05 MED ORDER — DIPHENOXYLATE-ATROPINE 2.5-0.025 MG PO TABS: 2.0000 | ORAL_TABLET | Freq: Four times a day (QID) | ORAL | 1 refills | Status: DC | PRN

## 2018-10-06 ENCOUNTER — Telehealth: Payer: Self-pay | Admitting: Hematology

## 2018-10-06 NOTE — Telephone Encounter (Signed)
Scheduled appt per 10/1 los.  Sent a message and an appt calendar will be mailed out.

## 2018-10-15 ENCOUNTER — Other Ambulatory Visit: Payer: Self-pay | Admitting: Radiology

## 2018-10-17 ENCOUNTER — Ambulatory Visit (HOSPITAL_COMMUNITY)
Admission: RE | Admit: 2018-10-17 | Discharge: 2018-10-17 | Disposition: A | Discharge status: Home / Self Care | Payer: Medicare HMO | Source: Ambulatory Visit | Attending: Hematology | Admitting: Hematology

## 2018-10-17 ENCOUNTER — Other Ambulatory Visit: Payer: Self-pay

## 2018-10-17 ENCOUNTER — Encounter (HOSPITAL_COMMUNITY): Payer: Self-pay

## 2018-10-17 DIAGNOSIS — R69 Illness, unspecified: Secondary | ICD-10-CM | POA: Diagnosis not present

## 2018-10-17 DIAGNOSIS — I2510 Atherosclerotic heart disease of native coronary artery without angina pectoris: Secondary | ICD-10-CM | POA: Diagnosis not present

## 2018-10-17 DIAGNOSIS — F329 Major depressive disorder, single episode, unspecified: Secondary | ICD-10-CM | POA: Insufficient documentation

## 2018-10-17 DIAGNOSIS — Z7982 Long term (current) use of aspirin: Secondary | ICD-10-CM | POA: Diagnosis not present

## 2018-10-17 DIAGNOSIS — Z923 Personal history of irradiation: Secondary | ICD-10-CM | POA: Insufficient documentation

## 2018-10-17 DIAGNOSIS — Z79899 Other long term (current) drug therapy: Secondary | ICD-10-CM | POA: Insufficient documentation

## 2018-10-17 DIAGNOSIS — C20 Malignant neoplasm of rectum: Secondary | ICD-10-CM | POA: Diagnosis not present

## 2018-10-17 DIAGNOSIS — Z452 Encounter for adjustment and management of vascular access device: Secondary | ICD-10-CM | POA: Insufficient documentation

## 2018-10-17 DIAGNOSIS — I252 Old myocardial infarction: Secondary | ICD-10-CM | POA: Insufficient documentation

## 2018-10-17 DIAGNOSIS — Z9221 Personal history of antineoplastic chemotherapy: Secondary | ICD-10-CM | POA: Diagnosis not present

## 2018-10-17 HISTORY — PX: IR REMOVAL TUN ACCESS W/ PORT W/O FL MOD SED: IMG2290

## 2018-10-17 LAB — CBC WITH DIFFERENTIAL/PLATELET
Abs Immature Granulocytes: 0.04 10*3/uL (ref 0.00–0.07)
Basophils Absolute: 0 10*3/uL (ref 0.0–0.1)
Basophils Relative: 1 %
Eosinophils Absolute: 0.1 10*3/uL (ref 0.0–0.5)
Eosinophils Relative: 1 %
HCT: 39.8 % (ref 36.0–46.0)
Hemoglobin: 12.1 g/dL (ref 12.0–15.0)
Immature Granulocytes: 1 %
Lymphocytes Relative: 9 %
Lymphs Abs: 0.6 10*3/uL — ABNORMAL LOW (ref 0.7–4.0)
MCH: 30 pg (ref 26.0–34.0)
MCHC: 30.4 g/dL (ref 30.0–36.0)
MCV: 98.8 fL (ref 80.0–100.0)
Monocytes Absolute: 0.4 10*3/uL (ref 0.1–1.0)
Monocytes Relative: 7 %
Neutro Abs: 5.3 10*3/uL (ref 1.7–7.7)
Neutrophils Relative %: 81 %
Platelets: 220 10*3/uL (ref 150–400)
RBC: 4.03 MIL/uL (ref 3.87–5.11)
RDW: 14.5 % (ref 11.5–15.5)
WBC: 6.4 10*3/uL (ref 4.0–10.5)
nRBC: 0 % (ref 0.0–0.2)

## 2018-10-17 LAB — PROTIME-INR
INR: 0.9 (ref 0.8–1.2)
Prothrombin Time: 11.8 seconds (ref 11.4–15.2)

## 2018-10-17 MED ORDER — SODIUM CHLORIDE 0.9 % IV SOLN
INTRAVENOUS | Status: DC
  Administered 2018-10-17: 11:00:00 via INTRAVENOUS

## 2018-10-17 MED ORDER — LIDOCAINE-EPINEPHRINE 1 %-1:100000 IJ SOLN
INTRAMUSCULAR | Status: AC
  Filled 2018-10-17: qty 1

## 2018-10-17 MED ORDER — CLINDAMYCIN PHOSPHATE 900 MG/50ML IV SOLN
INTRAVENOUS | Status: AC
  Administered 2018-10-17: 12:00:00 900 mg via INTRAVENOUS
  Filled 2018-10-17: qty 50

## 2018-10-17 MED ORDER — CLINDAMYCIN PHOSPHATE 900 MG/50ML IV SOLN
900.0000 mg | Freq: Once | INTRAVENOUS | Status: AC
  Administered 2018-10-17: 12:00:00 900 mg via INTRAVENOUS

## 2018-10-17 NOTE — Procedures (Signed)
Interventional Radiology Procedure:   Indications: History of rectal cancer  Procedure: Port removal  Findings: Complete removal of port  Complications: None     EBL: less than 20 ml  Plan: Discharge to home.    Jakaria Lavergne R. Anselm Pancoast, MD  Pager: [phone]

## 2018-10-17 NOTE — Discharge Instructions (Signed)
Implanted Port Removal, Care After °This sheet gives you information about how to care for yourself after your procedure. Your health care provider may also give you more specific instructions. If you have problems or questions, contact your health care provider. °What can I expect after the procedure? °After the procedure, it is common to have: °· Soreness or pain near your incision. °· Some swelling or bruising near your incision. °Follow these instructions at home: °Medicines °· Take over-the-counter and prescription medicines only as told by your health care provider. °· If you were prescribed an antibiotic medicine, take it as told by your health care provider. Do not stop taking the antibiotic even if you start to feel better. °Bathing °· Do not take baths, swim, or use a hot tub until your health care provider approves. Ask your health care provider if you can take showers. You may only be allowed to take sponge baths. °Incision care ° °· Follow instructions from your health care provider about how to take care of your incision. Make sure you: °? Wash your hands with soap and water before you change your bandage (dressing). If soap and water are not available, use hand sanitizer. °? Change your dressing as told by your health care provider. °? Keep your dressing dry. °? Leave stitches (sutures), skin glue, or adhesive strips in place. These skin closures may need to stay in place for 2 weeks or longer. If adhesive strip edges start to loosen and curl up, you may trim the loose edges. Do not remove adhesive strips completely unless your health care provider tells you to do that. °· Check your incision area every day for signs of infection. Check for: °? More redness, swelling, or pain. °? More fluid or blood. °? Warmth. °? Pus or a bad smell. °Driving ° °· Do not drive for 24 hours if you were given a medicine to help you relax (sedative) during your procedure. °· If you did not receive a sedative, ask your  health care provider when it is safe to drive. °Activity °· Return to your normal activities as told by your health care provider. Ask your health care provider what activities are safe for you. °· Do not lift anything that is heavier than 10 lb (4.5 kg), or the limit that you are told, until your health care provider says that it is safe. °· Do not do activities that involve lifting your arms over your head. °General instructions °· Do not use any products that contain nicotine or tobacco, such as cigarettes and e-cigarettes. These can delay healing. If you need help quitting, ask your health care provider. °· Keep all follow-up visits as told by your health care provider. This is important. °Contact a health care provider if: °· You have more redness, swelling, or pain around your incision. °· You have more fluid or blood coming from your incision. °· Your incision feels warm to the touch. °· You have pus or a bad smell coming from your incision. °· You have pain that is not relieved by your pain medicine. °Get help right away if you have: °· A fever or chills. °· Chest pain. °· Difficulty breathing. °Summary °· After the procedure, it is common to have pain, soreness, swelling, or bruising near your incision. °· If you were prescribed an antibiotic medicine, take it as told by your health care provider. Do not stop taking the antibiotic even if you start to feel better. °· Do not drive for 24 hours   if you were given a sedative during your procedure. °· Return to your normal activities as told by your health care provider. Ask your health care provider what activities are safe for you. °This information is not intended to replace advice given to you by your health care provider. Make sure you discuss any questions you have with your health care provider. °Document Released: 12/02/2014 Document Revised: 02/03/2017 Document Reviewed: 02/03/2017 °Elsevier Patient Education © 2020 [employer] ° °

## 2018-10-17 NOTE — Progress Notes (Signed)
Patient did not receive any sedation

## 2018-10-17 NOTE — H&P (Signed)
Chief Complaint: Patient was seen in consultation today for port removal  Referring Physician(s): Feng,Yan  Supervising Physician: Markus Daft  Patient Status: [facility] - Out-pt  History of Present Illness: Tonya [name] is a 68 y.o. female with a past medical history significant for CAD, MI, gastric bypass and colon cancer s/p chemoradiation and surgical resection followed by Dr. Burr Medico who presents today for port removal. Tonya [name] was originally diagnosed with colon/rectal cancer in 2018 and underwent surgical resection as well as several rounds of chemotherapy and radiation which was completed last year. She is currently being followed by oncology for surveillance and underwent CT chest/abd/pelvis with contrast on 10/02/18 which noted no evidence of metastatic disease. Request has been made to IR for port removal.  Tonya [name] states that she is very happy to have her port removed and states that she "shouldn't have had chemotherapy, it ruined my life. I'll never do it again." She states that she feels she has no life after chemotherapy and surgery because she has severe, constant diarrhea and fecal incontinence so she does not ever leave the house unless absolutely necessary and if she does have to leave the house she does not eat anything for >24 hours so she does not have diarrhea while in public. She states she has had many surgeries and has tried many medications, including opium, and none have improved her diarrhea. She denies suicidal thoughts to me today and states she is on medication for depression. She states understanding of requested procedure and wishes to proceed.   Past Medical History:  Diagnosis Date   Coronary artery disease 07/2003   Myocardial infarction [facility])    Obesity 2003   s/p gastric bypass     Past Surgical History:  Procedure Laterality Date   CHOLECYSTECTOMY  1999   GASTRIC BYPASS  2003   IR FLUORO GUIDE PORT INSERTION RIGHT  01/25/2017    IR US GUIDE VASC ACCESS RIGHT  01/25/2017    Allergies: Bactrim [sulfamethoxazole-trimethoprim], Phenergan [promethazine hcl], Sulfa antibiotics, and Penicillins  Medications: Prior to Admission medications   Medication Sig Start Date End Date Taking? Authorizing Provider  aspirin 81 MG chewable tablet Chew 81 mg by mouth daily.   Yes [provider]  Cholecalciferol (VITAMIN D3) 2000 units capsule Take 1 capsule by mouth daily.   Yes [provider]  diphenoxylate-atropine (LOMOTIL) 2.5-0.025 MG tablet Take 2 tablets by mouth 4 (four) times daily as needed for diarrhea or loose stools. 10/05/18  Yes Truitt Merle, MD  ferrous sulfate (FERROUSUL) 325 (65 FE) MG tablet Take 1 tablet (325 mg total) by mouth daily with breakfast. Patient taking differently: Take 325 mg by mouth. Take two by mouth daily 02/11/14  Yes Lucille Passy, MD  ibuprofen (ADVIL,MOTRIN) 200 MG tablet Take 200 mg by mouth every 6 (six) hours as needed.   Yes [provider]  Multiple Vitamin (MULTIVITAMIN) tablet Take 1 tablet by mouth daily.   Yes [provider]     Family History  Problem Relation Age of Onset   Heart attack Mother        d.93   Throat cancer Mother 75   Clotting disorder Mother    Liver disease Mother    Kidney disease Mother    Heart attack Father        d.92   Prostate cancer Father 80   Uterine cancer Sister 30       treated with total hysterectomy and radiation  Thyroid cancer Sister 9       treated with thyroidectomy   Cancer Maternal Uncle        d.80s unspecified type of cancer. History of smoking.   Uterine cancer Sister 55   Colon cancer Cousin 58       paternal first-cousin    Social History   Socioeconomic History   Marital status: Single    Spouse name: Not on file   Number of children: 0   Years of education: Not on file   Highest education level: Not on file  Occupational History   Occupation: retired  Engineer, site strain: Not on file   Food insecurity    Worry: Not on file    Inability: Not on Lexicographer needs    Medical: Not on file    Non-medical: Not on file  Tobacco Use   Smoking status: Never Smoker   Smokeless tobacco: Never Used  Substance and Sexual Activity   Alcohol use: No   Drug use: No   Sexual activity: Not on file  Lifestyle   Physical activity    Days per week: Not on file    Minutes per session: Not on file   Stress: Not on file  Relationships   Social connections    Talks on phone: Not on file    Gets together: Not on file    Attends religious service: Not on file    Active member of club or organization: Not on file    Attends meetings of clubs or organizations: Not on file    Relationship status: Not on file  Other Topics Concern   Not on file  Social History Narrative   Not on file     Review of Systems: A 12 point ROS discussed and pertinent positives are indicated in the HPI above.  All other systems are negative.  Review of Systems  Constitutional: Negative for chills and fever.  HENT: Negative for nosebleeds.   Respiratory: Negative for cough and shortness of breath.   Cardiovascular: Negative for chest pain.  Gastrointestinal: Positive for diarrhea. Negative for abdominal pain, blood in stool, nausea and vomiting.  Genitourinary: Negative for hematuria.  Musculoskeletal: Negative for back pain.  Skin: Negative for rash.  Neurological: Negative for dizziness and headaches.    Vital Signs: BP (!) 150/99    Pulse 86    Temp 98.1 F (36.7 C) (Oral)    Resp 18    SpO2 100%   Physical Exam Vitals signs reviewed.  Constitutional:      General: She is not in acute distress. HENT:     Mouth/Throat:     Mouth: Mucous membranes are moist.     Pharynx: Oropharynx is clear. No oropharyngeal exudate or posterior oropharyngeal erythema.  Cardiovascular:     Rate and Rhythm: Normal rate and regular rhythm.      Comments: (+) right port Pulmonary:     Effort: Pulmonary effort is normal.     Breath sounds: Normal breath sounds.  Abdominal:     General: There is no distension.     Palpations: Abdomen is soft.     Tenderness: There is no abdominal tenderness.  Skin:    General: Skin is warm and dry.  Neurological:     Mental Status: She is alert and oriented to person, place, and time.  Psychiatric:        Mood and Affect: Mood normal.  Behavior: Behavior normal.        Thought Content: Thought content normal.        Judgment: Judgment normal.      MD Evaluation Airway: WNL Heart: WNL Abdomen: WNL Chest/ Lungs: WNL ASA  Classification: 3 Mallampati/Airway Score: Two   Imaging: Ct Chest W Contrast  Result Date: 10/02/2018 CLINICAL DATA:  Rectal cancer, adjuvant therapy EXAM: CT CHEST, ABDOMEN, AND PELVIS WITH CONTRAST TECHNIQUE: Multidetector CT imaging of the chest, abdomen and pelvis was performed following the standard protocol during bolus administration of intravenous contrast. CONTRAST:  140mL OMNIPAQUE IOHEXOL 300 MG/ML SOLN, additional oral enteric contrast COMPARISON:  10/19/2017 FINDINGS: CT CHEST FINDINGS Cardiovascular: Right chest port catheter. Mild aortic atherosclerosis. Normal heart size. Three-vessel coronary artery calcifications. No pericardial effusion. Mediastinum/Nodes: No enlarged mediastinal, hilar, or axillary lymph nodes. Thyroid gland, trachea, and esophagus demonstrate no significant findings. Lungs/Pleura: Lungs are clear. No pleural effusion or pneumothorax. Musculoskeletal: No chest wall mass or suspicious bone lesions identified. CT ABDOMEN PELVIS FINDINGS Hepatobiliary: No focal liver abnormality is seen. Status post cholecystectomy. No biliary dilatation. Pancreas: Unremarkable. No pancreatic ductal dilatation or surrounding inflammatory changes. Spleen: Normal in size without significant abnormality. Adrenals/Urinary Tract: Adrenal glands are  unremarkable. Under rotated right kidney. Tiny nonobstructive calculi of the midportion. Bladder is unremarkable. Stomach/Bowel: Stomach is within normal limits. Appendix is not clearly visualized and may be surgically absent. Redemonstrated postoperative findings of rectal resection and reanastomosis. No evidence of recurrent mass or abnormal soft tissue in the pelvis. Vascular/Lymphatic: Aortic atherosclerosis. 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 No enlarged abdominal or pelvic lymph nodes. Reproductive: Air within the endometrial cavity. Other: No abdominal wall hernia or abnormality. No abdominopelvic ascites. There is a new, lobulated soft tissue nodule in the superficial left gluteal soft tissues measuring 3.0 x 2.2 cm (series 2, image 91). Musculoskeletal: No acute or significant osseous findings. IMPRESSION: 1. Redemonstrated postoperative findings of rectal resection and reanastomosis. No evidence of recurrent mass or abnormal soft tissue in the pelvis. 2. No evidence of metastatic disease in the chest, abdomen, or pelvis. 3. There is a new, lobulated soft tissue nodule in the superficial left gluteal soft tissues measuring 3.0 x 2.2 cm (series 2, image 91). This is likely related to injection or trauma, and a very unlikely manifestation of metastatic disease. This can be further evaluated by ultrasound if desired. 4. 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 This can be further interrogated by fluoroscopy if desired. 5. Coronary artery disease. Aortic Atherosclerosis (ICD10-I70.0) and Emphysema (ICD10-J43.9). Electronically Signed   By: Eddie Candle M.D.   On: 10/02/2018 10:30   Ct Abdomen Pelvis W Contrast  Result Date: 10/02/2018 CLINICAL DATA:  Rectal cancer, adjuvant therapy EXAM: CT CHEST, ABDOMEN, AND PELVIS WITH CONTRAST TECHNIQUE: Multidetector CT imaging of the chest,  abdomen and pelvis was performed following the standard protocol during bolus administration of intravenous contrast. CONTRAST:  174mL OMNIPAQUE IOHEXOL 300 MG/ML SOLN, additional oral enteric contrast COMPARISON:  10/19/2017 FINDINGS: CT CHEST FINDINGS Cardiovascular: Right chest port catheter. Mild aortic atherosclerosis. Normal heart size. Three-vessel coronary artery calcifications. No pericardial effusion. Mediastinum/Nodes: No enlarged mediastinal, hilar, or axillary lymph nodes. Thyroid gland, trachea, and esophagus demonstrate no significant findings. Lungs/Pleura: Lungs are clear. No pleural effusion or pneumothorax. Musculoskeletal: No chest wall mass or suspicious bone lesions identified. CT ABDOMEN PELVIS FINDINGS Hepatobiliary: No focal liver abnormality is seen.  Status post cholecystectomy. No biliary dilatation. Pancreas: Unremarkable. No pancreatic ductal dilatation or surrounding inflammatory changes. Spleen: Normal in size without significant abnormality. Adrenals/Urinary Tract: Adrenal glands are unremarkable. Under rotated right kidney. Tiny nonobstructive calculi of the midportion. Bladder is unremarkable. Stomach/Bowel: Stomach is within normal limits. Appendix is not clearly visualized and may be surgically absent. Redemonstrated postoperative findings of rectal resection and reanastomosis. No evidence of recurrent mass or abnormal soft tissue in the pelvis. Vascular/Lymphatic: Aortic atherosclerosis. 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 No enlarged abdominal or pelvic lymph nodes. Reproductive: Air within the endometrial cavity. Other: No abdominal wall hernia or abnormality. No abdominopelvic ascites. There is a new, lobulated soft tissue nodule in the superficial left gluteal soft tissues measuring 3.0 x 2.2 cm (series 2, image 91). Musculoskeletal: No acute or significant osseous findings. IMPRESSION: 1. Redemonstrated  postoperative findings of rectal resection and reanastomosis. No evidence of recurrent mass or abnormal soft tissue in the pelvis. 2. No evidence of metastatic disease in the chest, abdomen, or pelvis. 3. There is a new, lobulated soft tissue nodule in the superficial left gluteal soft tissues measuring 3.0 x 2.2 cm (series 2, image 91). This is likely related to injection or trauma, and a very unlikely manifestation of metastatic disease. This can be further evaluated by ultrasound if desired. 4. 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 This can be further interrogated by fluoroscopy if desired. 5. Coronary artery disease. Aortic Atherosclerosis (ICD10-I70.0) and Emphysema (ICD10-J43.9). Electronically Signed   By: Eddie Candle M.D.   On: 10/02/2018 10:30    Labs:  CBC: Recent Labs    04/04/18 06/05/18 1019 08/07/18 1112 10/02/18 0733  WBC 6.1 5.7 6.2 5.7  HGB 12.3 10.7* 11.3* 11.4*  HCT 37 34.1* 35.7* 35.5*  PLT 224 228 219 197    COAGS: No results for input(s): INR, APTT in the last 8760 hours.  BMP: Recent Labs    02/27/18 1012  04/06/18 06/05/18 1019 08/07/18 1112 10/02/18 0733  NA 140   < > 135* 142 140 139  K 3.8   < > 3.4 4.3 4.6 4.5  CL 113*  --   --  110 111 106  CO2 19*  --   --  26 22 23   GLUCOSE 95  --   --  111* 100* 103*  BUN 17   < > 7 19 22 18   CALCIUM 8.5*  --   --  8.7* 9.1 8.9  CREATININE 0.64   < > 0.4* 0.62 0.76 0.70  GFRNONAA >60  --   --  >60 >60 >60  GFRAA >60  --   --  >60 >60 >60   < > = values in this interval not displayed.    LIVER FUNCTION TESTS: Recent Labs    02/27/18 1012 06/05/18 1019 08/07/18 1112 10/02/18 0733  BILITOT 0.3 0.2* 0.2* 0.4  AST 22 35 20 19  ALT 29 41 21 17  ALKPHOS 106 54 58 56  PROT 6.3* 6.0* 6.4* 6.6  ALBUMIN 3.5 3.4* 3.5 3.8    TUMOR MARKERS: No results for input(s): AFPTM, CEA, CA199, CHROMGRNA in the last 8760 hours.  Assessment and Plan:  68  y/o F with history of rectal/colon cancer s/p surgical resection and chemotherapy which was completed last year who presents today for port removal.   Patient has been NPO for over 36 hours except  for some water (last water before midnight) due to concern for diarrhea, she does not take blood thinning medications. Afebrile, WBC 6.4, hgb 12.1, plt 220, INR pending at time of this note writing.  Risks and benefits of image guided port-a-catheter removalt were discussed with the patient including, but not limited to bleeding, infection, pneumothorax and need for additional procedures.  All of the patient's questions were answered, patient is agreeable to proceed.  Consent signed and in chart.  Thank you for this interesting consult.  I greatly enjoyed meeting Stesha Oxley and look forward to participating in their care.  A copy of this report was sent to the requesting provider on this date.  Electronically Signed: Joaquim Nam, PA-C 10/17/2018, 10:38 AM   I spent a total of  15 Minutes in face to face in clinical consultation, greater than 50% of which was counseling/coordinating care for port removal.

## 2018-10-19 DIAGNOSIS — R159 Full incontinence of feces: Secondary | ICD-10-CM | POA: Diagnosis not present

## 2018-10-19 DIAGNOSIS — K6289 Other specified diseases of anus and rectum: Secondary | ICD-10-CM | POA: Diagnosis not present

## 2018-10-28 ENCOUNTER — Other Ambulatory Visit (HOSPITAL_COMMUNITY)
Admission: RE | Admit: 2018-10-28 | Discharge: 2018-10-28 | Disposition: A | Discharge status: Home / Self Care | Payer: Medicare HMO | Source: Ambulatory Visit | Attending: General Surgery | Admitting: General Surgery

## 2018-10-28 DIAGNOSIS — Z01812 Encounter for preprocedural laboratory examination: Secondary | ICD-10-CM | POA: Insufficient documentation

## 2018-10-28 DIAGNOSIS — Z20828 Contact with and (suspected) exposure to other viral communicable diseases: Secondary | ICD-10-CM | POA: Diagnosis not present

## 2018-10-29 LAB — NOVEL CORONAVIRUS, NAA (HOSP ORDER, SEND-OUT TO REF LAB; TAT 18-24 HRS): SARS-CoV-2, NAA: NOT DETECTED

## 2018-10-30 ENCOUNTER — Other Ambulatory Visit (HOSPITAL_COMMUNITY): Payer: Medicare HMO

## 2018-11-01 ENCOUNTER — Ambulatory Visit (HOSPITAL_COMMUNITY)
Admission: RE | Admit: 2018-11-01 | Discharge: 2018-11-01 | Disposition: A | Discharge status: Home / Self Care | Payer: Medicare HMO | Attending: General Surgery | Admitting: General Surgery

## 2018-11-01 ENCOUNTER — Encounter (HOSPITAL_COMMUNITY): Payer: Self-pay | Admitting: *Deleted

## 2018-11-01 ENCOUNTER — Encounter (HOSPITAL_COMMUNITY)
Admission: RE | Disposition: A | Discharge status: Home / Self Care | Payer: Self-pay | Source: Home / Self Care | Attending: General Surgery

## 2018-11-01 DIAGNOSIS — R159 Full incontinence of feces: Secondary | ICD-10-CM | POA: Insufficient documentation

## 2018-11-01 HISTORY — PX: ANAL RECTAL MANOMETRY: SHX6358

## 2018-11-01 SURGERY — MANOMETRY, ANORECTAL

## 2018-11-01 NOTE — Progress Notes (Addendum)
Anorectal manometry performed per protocol. Patient tolerated well.  Patient unable to expel 50 cc balloon at 3 minutes.  Dr Leighton Ruff notified of report to be read.

## 2018-11-02 ENCOUNTER — Encounter (HOSPITAL_COMMUNITY): Payer: Self-pay | Admitting: General Surgery

## 2018-11-02 DIAGNOSIS — R159 Full incontinence of feces: Secondary | ICD-10-CM | POA: Diagnosis not present

## 2018-11-08 ENCOUNTER — Ambulatory Visit: Payer: Self-pay | Admitting: General Surgery

## 2018-11-08 DIAGNOSIS — K629 Disease of anus and rectum, unspecified: Secondary | ICD-10-CM | POA: Diagnosis not present

## 2018-11-08 DIAGNOSIS — R159 Full incontinence of feces: Secondary | ICD-10-CM | POA: Diagnosis not present

## 2018-11-08 NOTE — H&P (Signed)
History of Present Illness Leighton Ruff MD; 99991111 10:26 AM) The patient is a 68 year old female who presents with fecal incontinence. 68 year old female who presents to the office for evaluation of fecal incontinence after low anterior resection. This was done at [facility]. Since that time she has had chronic diarrhea and difficulty with fecal leakage and urgency as well as complete incontinence. 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 She denies any rectal bleeding or pain. She has completed chemotherapy and radiation and is currently in surveillance for her rectal cancer. She has tried maximum dose Imodium and Lomotil as well as Citrucel fiber supplements. She continues to have loose pudding consistency type stools that occur throughout the day. I did have her complete a bowel diary. She has moderate to heavy amounts of leakage that occur approximately 13 times a day. 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 This was completed in January 2020. She has noted a slight improvement in her stools. She is no longer having watery diarrhea, but she continues to have unformed bowel movements. She is here today to evaluate anything further that can be done to avoid an ostomy. She underwent manometry recently which shows normal resting pressure and decreased squeeze. She does have decreased compliance as well.   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 Phenergan *ANTIHISTAMINES* Sulfa Antibiotics Anaphylaxis. Penicillins Allergies Reconciled  Medication History Sabino Gasser, CMA; 11/08/2018 10:03 AM) Creon (36000UNIT Capsule DR Part, 1 (one) Oral 3 times a day, Taken starting 01/31/2018) Active. (take with meals) Cholestyramine (4GM Packet, 1 (one) Oral before every meal, Taken starting 01/31/2018) Active. Lomotil (2.5-0.025MG  Tablet, 1 (one) Oral four times daily, Taken starting 01/10/2018) Active. Codeine Sulfate (15MG  Tablet, Oral) Active. Viberzi (100MG  Tablet, Oral) Active. Estradiol (0.1MG /GM Cream, Vaginal) Active. Imodium A-D (Oral) Specific strength unknown - Active. Citracal Maximum (Oral) Specific strength unknown - Active. Vitamin D (Ergocalciferol) (50000UNIT Capsule, Oral) Active. Aspirin (81MG  Tablet, Oral) Active. Medications Reconciled  Social History Leighton Ruff, MD; 99991111 10:26 AM) Caffeine use Coffee, Tea. No alcohol use No drug use Tobacco use Never smoker.  Family History Leighton Ruff, MD; 99991111 10:26 AM) Cancer Mother, Sister. Alcohol Abuse Father. Diabetes Mellitus Father, Mother, Sister. Heart Disease Father, Mother, Sister. Migraine Headache Sister. Thyroid problems Sister. Heart disease in female family member before age 58 Hypertension Mother.  Pregnancy / Birth History Leighton Ruff, MD; 99991111 10:26 AM) Age at menarche 58 years, 19 years. Age of menopause <47 Gravida 0 Para 0  Other Problems Leighton Ruff, MD; 99991111 10:26 AM) Myocardial infarction Rectal Cancer     Review of Systems Leighton Ruff MD; 99991111 10:26 AM) General Present- Appetite Loss, Fatigue and Weight Loss. Not Present- Chills, Fever, Night Sweats and Weight Gain. Skin Not Present- Change in Wart/Mole, Dryness, Hives, Jaundice, New Lesions, Non-Healing Wounds, Rash and Ulcer. HEENT Present- Wears  glasses/contact lenses. Not Present- Earache, Hearing Loss, Hoarseness, Nose Bleed, Oral Ulcers, Ringing in the Ears, Seasonal Allergies, Sinus Pain, Sore Throat, Visual Disturbances  and Yellow Eyes. Respiratory Not Present- Bloody sputum, Chronic Cough, Difficulty Breathing, Snoring and Wheezing. Breast Not Present- Breast Mass, Breast Pain, Nipple Discharge and Skin Changes. Cardiovascular Not Present- Chest Pain, Difficulty Breathing Lying Down, Leg Cramps, Palpitations, Rapid Heart Rate, Shortness of Breath and Swelling of Extremities. Gastrointestinal Present- Bloody Stool, Change in Bowel Habits, Excessive gas, Hemorrhoids and Rectal Pain. Not Present- Abdominal Pain, Bloating, Chronic diarrhea, Constipation, Difficulty Swallowing, Gets full quickly at meals, Indigestion, Nausea and Vomiting. Female Genitourinary Not Present- Frequency, Nocturia, Painful Urination, Pelvic Pain and Urgency. Musculoskeletal Not Present- Back Pain, Joint Pain, Joint Stiffness, Muscle Pain, Muscle Weakness and Swelling of Extremities. Neurological Not Present- Decreased Memory, Fainting, Headaches, Numbness, Seizures, Tingling, Tremor, Trouble walking and Weakness. Psychiatric Not Present- Anxiety, Bipolar, Change in Sleep Pattern, Depression, Fearful and Frequent crying. Endocrine Not Present- Cold Intolerance, Excessive Hunger, Hair Changes, Heat Intolerance, Hot flashes and New Diabetes. Hematology Not Present- Blood Thinners, Easy Bruising, Excessive bleeding, Gland problems, HIV and Persistent Infections.  Vitals Sabino Gasser CMA; 11/08/2018 10:04 AM) 11/08/2018 10:03 AM Weight: 161.4 lb Height: 60in Body Surface Area: 1.7 m Body Mass Index: 31.52 kg/m  Temp.: 97.64F(Oral)  Pulse: 99 (Regular)  BP: 128/72 (Sitting, Left Arm, Standard)        Physical Exam Leighton Ruff MD; 99991111 10:25 AM)  General Mental Status-Alert. General Appearance-Not in acute distress. Build &  Nutrition-Well nourished. Posture-Normal posture. Gait-Normal.  Head and Neck Head-normocephalic, atraumatic with no lesions or palpable masses. Trachea-midline.  Chest and Lung Exam Chest and lung exam reveals -on auscultation, normal breath sounds, no adventitious sounds and normal vocal resonance.  Cardiovascular Cardiovascular examination reveals -normal heart sounds, regular rate and rhythm with no murmurs and no digital clubbing, cyanosis, edema, increased warmth or tenderness.  Abdomen Inspection Inspection of the abdomen reveals - No Hernias. Palpation/Percussion Palpation and Percussion of the abdomen reveal - Soft, Non Tender, No Rigidity (guarding), No hepatosplenomegaly and No Palpable abdominal masses.  Rectal Note: Decreased squeez pressure  Neurologic Neurologic evaluation reveals -alert and oriented x 3 with no impairment of recent or remote memory, normal attention span and ability to concentrate, normal sensation and normal coordination.  Musculoskeletal Normal Exam - Bilateral-Upper Extremity Strength Normal and Lower Extremity Strength Normal.    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 She has chronically loose stools that are intractable to medical management. She also has some decreased sphincter tone. Manometry shows weak squeeze with normal resting pressure and decreased compliance. She did complete a bowel diary, which shows leakage occurring averaging 13 times a day. She has exhausted all other medical management and is requesting evaluation for sacral nerve stimulator. I think she is a reasonable candidate for this. I have discussed this in detail including risk of bleeding, infection and failure of the device. I believe she understands this and wishes to  proceed. We discussed that she will need a 2-stage procedure with approximately 1 week in between.

## 2018-11-10 IMAGING — MR MR ABDOMEN WO/W CM
11 of 26 series · 21 of 48 positions shown · IV contrast (multihance)
Comparison: 07/12/2016.

CLINICAL DATA: Rectal cancer.  Staging.

EXAM:
MRI ABDOMEN AND PELVIS WITHOUT AND WITH CONTRAST
TECHNIQUE: Multiplanar multisequence MR imaging of the abdomen and pelvis was
performed both before and after the administration of intravenous
contrast.
CONTRAST:  14mL MULTIHANCE GADOBENATE DIMEGLUMINE 529 MG/ML IV SOLN

[Series 3: T2 · sagittal · 3.0mm · 0.49mm/px · 2 of 50 slices shown (1 of 6)]
[im 1/50]
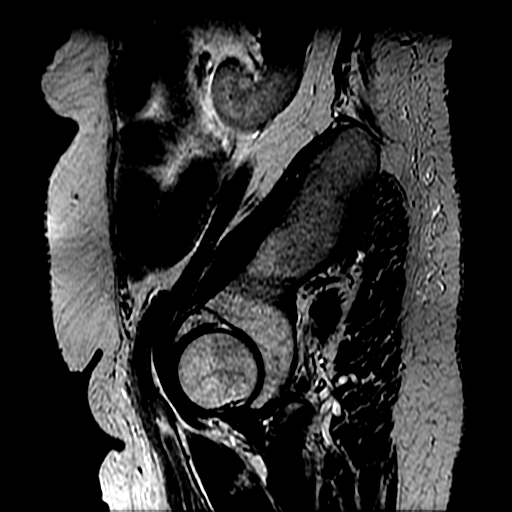
[im 50/50]
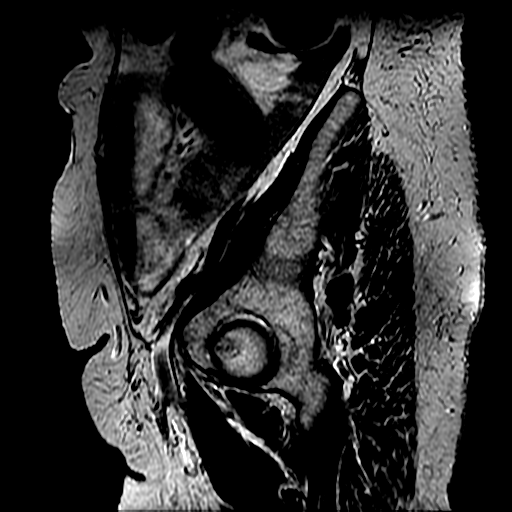

[Series 4: T2 · axial · 5.0mm · 0.78mm/px · z∈[-76,+200]mm · 2 of 47 slices shown (2 of 6)]
[im 1/47]
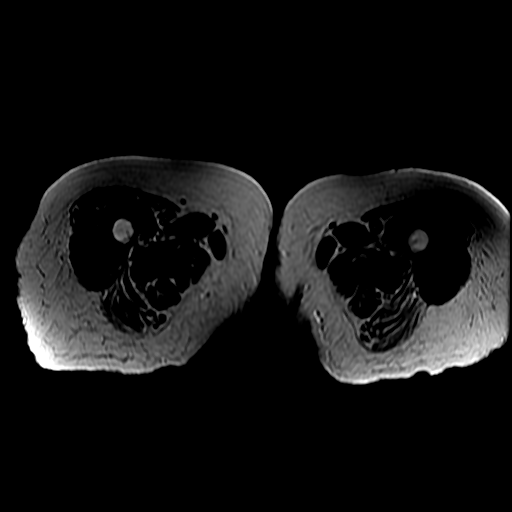
[im 47/47]
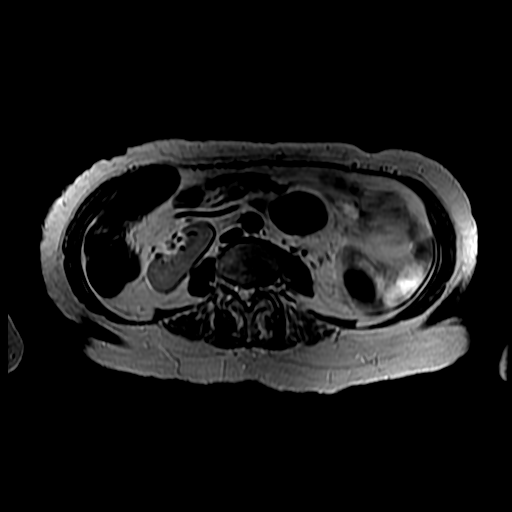

[Series 5: T2 · axial · 3.0mm · 0.47mm/px · z∈[-55,+144]mm · 3 of 79 slices shown (3 of 6)]
[im 1/79]
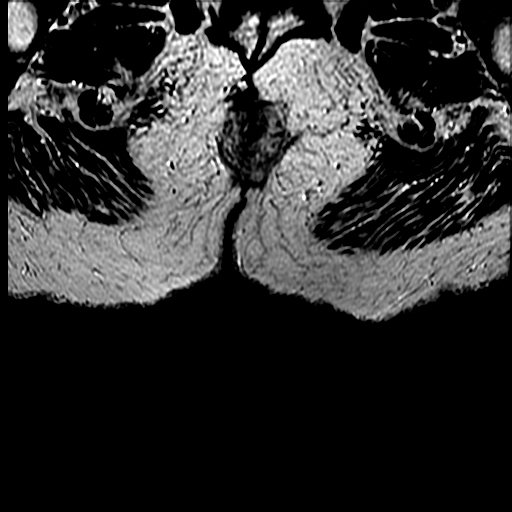
[im 40/79]
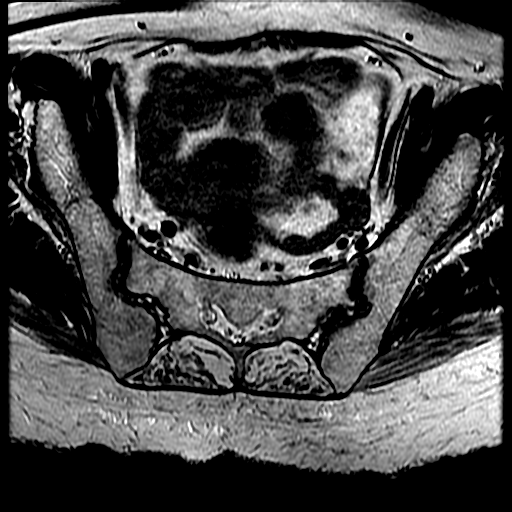
[im 79/79]
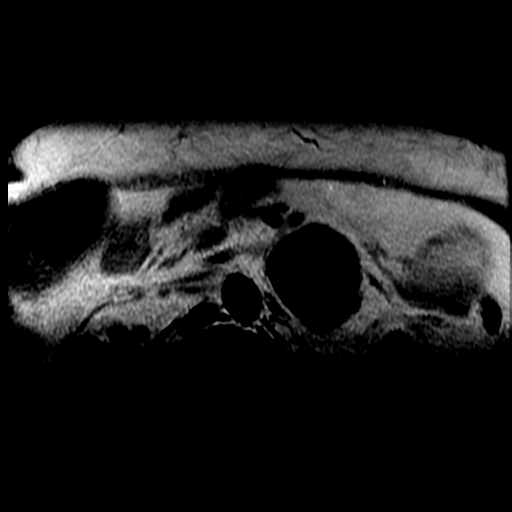

[Series 6: T2 · oblique · 3.0mm · 0.47mm/px · 2 of 42 slices shown (4 of 6)]
[im 1/42]
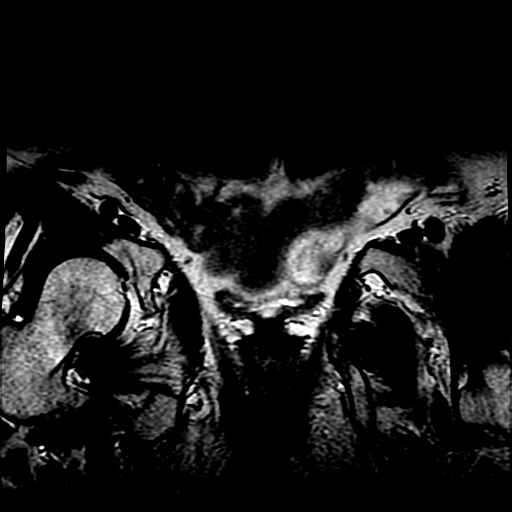
[im 42/42]
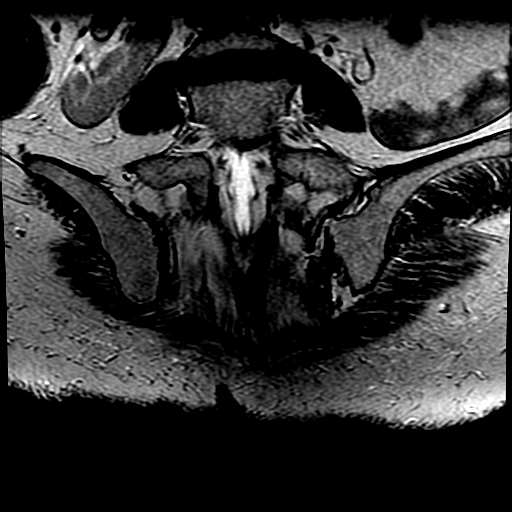

[Series 7: T1 fat-sat · axial · 5.0mm · 0.78mm/px · z∈[-76,+200]mm · 2 of 47 slices shown]
[im 1/47]
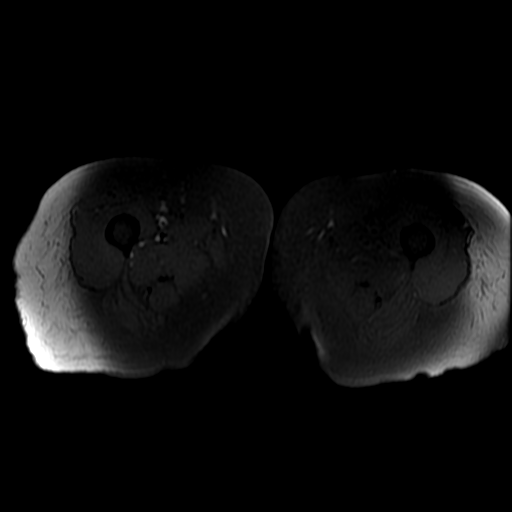
[im 47/47]
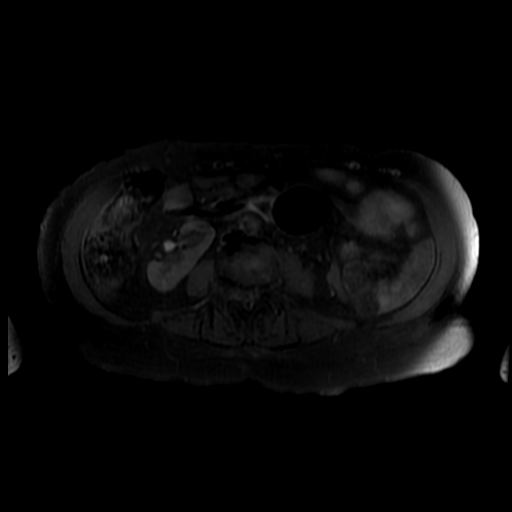

[Series 11: T2 fat-sat · axial · 5.0mm · 0.78mm/px · z∈[+159,+429]mm · 2 of 55 slices shown]
[im 1/55]
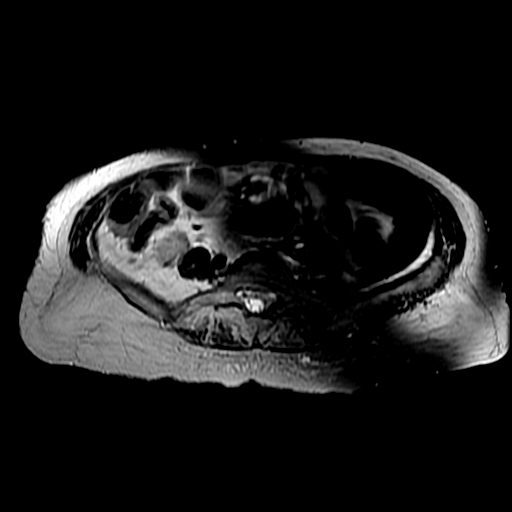
[im 55/55]
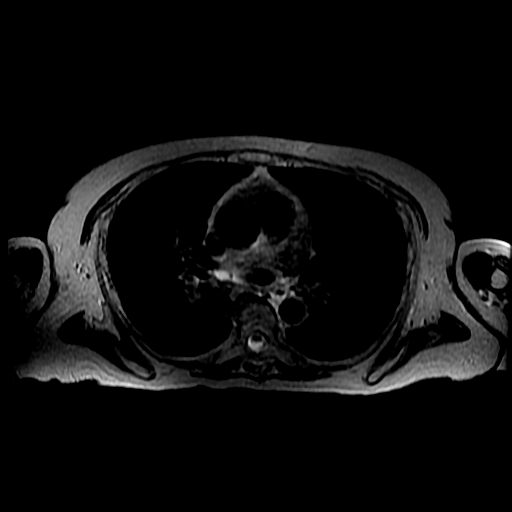

[Series 12: DWI b500 · axial · 6.0mm · 1.48mm/px · z∈[+149,+415]mm · 2 of 64 slices shown]
[im 1/64]
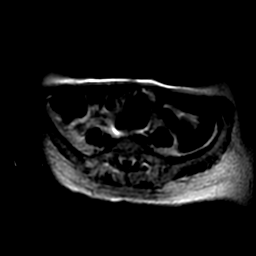
[im 64/64]
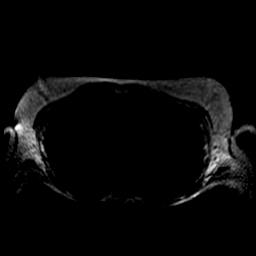

[Series 15: ax dualecho · axial · 5.0mm · 0.78mm/px · z∈[+156,+416]mm · 3 of 106 slices shown]
[im 1/106]
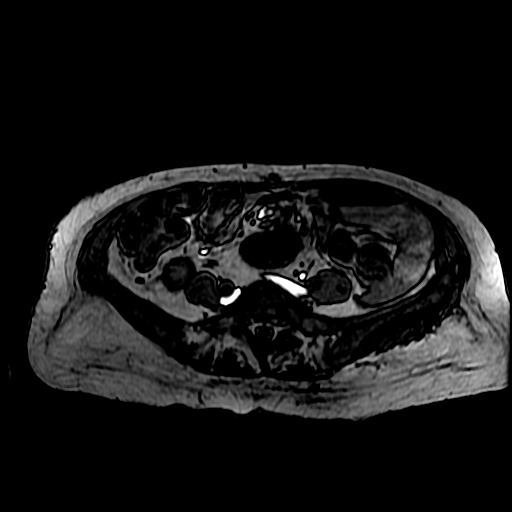
[im 53/106]
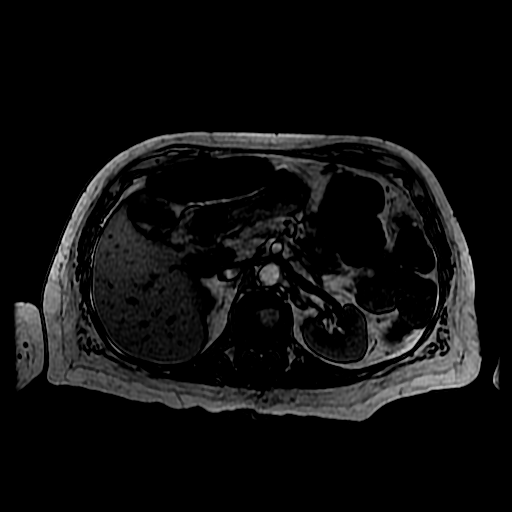
[im 106/106]
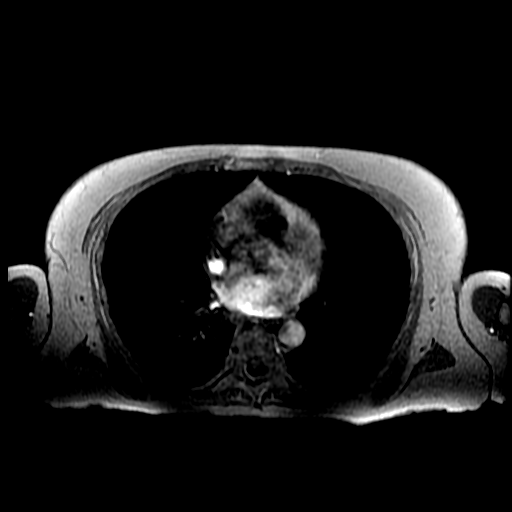

[Series 16: T2 · axial · 5.0mm · 0.78mm/px · 1 of 53 slices shown (5 of 6)]
[im 1/53]
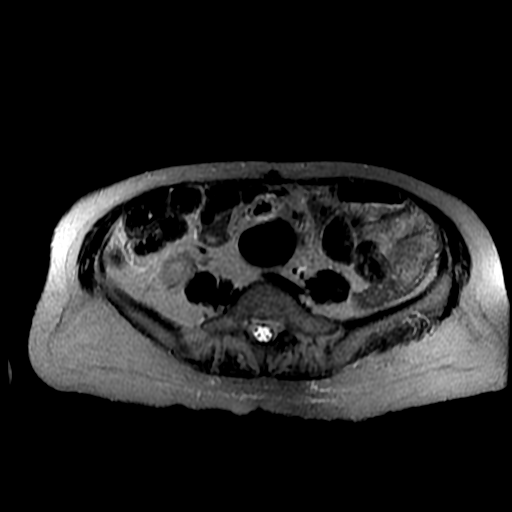

[Series 17: T2 · coronal · 5.0mm · 1.56mm/px · 1 of 37 slices shown (6 of 6)]
[im 1/37]
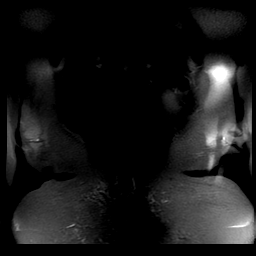

[Series 18: bSSFP · axial · 5.0mm · 1.56mm/px · 1 of 53 slices shown]
[im 1/53]
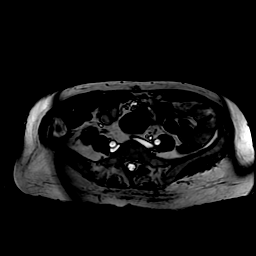

[21 of 48 positions shown; findings below may reference images not displayed]

FINDINGS: COMBINED FINDINGS FOR BOTH MR ABDOMEN AND PELVIS

Lower chest: No acute findings.

Hepatobiliary: Within the anterior dome of liver there is a small
arterial phase enhancing structure measuring 8 mm, image 11 of
series [id number]. This becomes isointense to liver on the portal venous
and delayed phase images. There is a focal area of signal dropout
within segment 4 B which corresponds to the hypodense lesion on
previous exam from 07/12/2016 compatible with focal fatty
deposition. Previous cholecystectomy. Mild increase caliber of the
CBD which measures 7 mm. No obstructing stone or mass noted.

Pancreas: No mass, inflammatory changes, or other parenchymal
abnormality identified.

Spleen:  Within normal limits in size and appearance.

Adrenals/Urinary Tract: Small bilateral adrenal nodules show loss of
signal on the out of phase sequences compatible with benign
adenomas. No kidney mass identified. Urinary bladder is
unremarkable.

Stomach/Bowel: The stomach is normal. No pathologic dilatation of
the large or small bowel loops.

Rectal mass is again noted. This measures 5.3 x 4.1 x 6.1 cm. This
extends to the level of the an anorectal angle compatible with a low
rectal cancer. The lesion extends beyond the muscularis propria by
at least 2.4 cm. No surrounding lymph nodes identified.

Vascular/Lymphatic: No pathologically enlarged lymph nodes
identified. No abdominal aortic aneurysm demonstrated.

Reproductive: Uterus and bilateral adnexa are unremarkable.

Other:  None.

Musculoskeletal: No suspicious bone lesions identified.
IMPRESSION: 1. Large rectal mass measures 6.1 cm in length. No obstruction
identified. This is compatible with at least a [id number] lesion.
2. No specific features highly suspicious for nodal metastasis or
metastatic disease to the upper abdomen.
3. Indeterminate arterial phase enhancing lesion within the anterior
dome of liver measures less than 1 cm. This may represent a benign
liver lesions such as FNH or adenoma. Less favored with the
hypervascular liver metastasis. Followup [HOSPITAL] 6 months with
repeat MRI of the liver is advise.
4. Bilateral adrenal adenomas.

## 2018-12-04 ENCOUNTER — Other Ambulatory Visit: Payer: Self-pay | Admitting: Hematology

## 2018-12-04 DIAGNOSIS — Z1231 Encounter for screening mammogram for malignant neoplasm of breast: Secondary | ICD-10-CM

## 2018-12-14 DIAGNOSIS — C44311 Basal cell carcinoma of skin of nose: Secondary | ICD-10-CM | POA: Diagnosis not present

## 2018-12-14 DIAGNOSIS — D225 Melanocytic nevi of trunk: Secondary | ICD-10-CM | POA: Diagnosis not present

## 2018-12-14 DIAGNOSIS — D485 Neoplasm of uncertain behavior of skin: Secondary | ICD-10-CM | POA: Diagnosis not present

## 2018-12-14 DIAGNOSIS — L821 Other seborrheic keratosis: Secondary | ICD-10-CM | POA: Diagnosis not present

## 2018-12-22 DIAGNOSIS — C44311 Basal cell carcinoma of skin of nose: Secondary | ICD-10-CM | POA: Diagnosis not present

## 2019-01-09 ENCOUNTER — Other Ambulatory Visit (HOSPITAL_COMMUNITY)
Admission: RE | Admit: 2019-01-09 | Discharge: 2019-01-09 | Disposition: A | Discharge status: Home / Self Care | Payer: Medicare HMO | Source: Ambulatory Visit | Attending: General Surgery | Admitting: General Surgery

## 2019-01-09 ENCOUNTER — Other Ambulatory Visit: Payer: Self-pay

## 2019-01-09 ENCOUNTER — Encounter (HOSPITAL_BASED_OUTPATIENT_CLINIC_OR_DEPARTMENT_OTHER): Payer: Self-pay | Admitting: General Surgery

## 2019-01-09 DIAGNOSIS — Z20822 Contact with and (suspected) exposure to covid-19: Secondary | ICD-10-CM | POA: Diagnosis not present

## 2019-01-09 DIAGNOSIS — Z01812 Encounter for preprocedural laboratory examination: Secondary | ICD-10-CM | POA: Diagnosis not present

## 2019-01-09 NOTE — Progress Notes (Addendum)
   Patient denies shortness of breath, chest pain, fever, and cough at this phone interview. Spoke w/ via phone for pre-op interview---[facility] Lab needs dos----    none          Lab results------chest ct 10-02-2018 chart/epic COVID test ------01-09-2019 Arrive at -------1200 pm 01-12-2019 NPO after ------midnight food, clear liquids midnight until 800 am then npo Medications to take morning of surgery -----lomotil prn Diabetic medication -----n/a Patient Special Instructions ----- Pre-Op special Istructions ----- Patient verbalized understanding of instructions that were given at this phone interview. Patient denies shortness of breath, chest pain, fever, cough a this phone interview.  Anesthesia Review: ok to proceed and get I stat 8 and ekg day of surgery per Janett Billow zaneto pa PCP: Arnette Norris Cardiologist :none Chest  Ct 10-02-2018 EKG :none Echo :none Cardiac Cath : none Sleep Study/ CPAP :none Fasting Blood Sugar :      / Checks Blood Sugar -- times a day:  n/a Blood Thinner/ Instructions /Last Dose:stay on 81 mg aspirin per dr Marcello Moores inbstructions ASA / Instructions/ Last Dose :   Patient denies shortness of breath, chest pain, fever, and cough at this phone interview.

## 2019-01-10 LAB — NOVEL CORONAVIRUS, NAA (HOSP ORDER, SEND-OUT TO REF LAB; TAT 18-24 HRS): SARS-CoV-2, NAA: NOT DETECTED

## 2019-01-12 ENCOUNTER — Encounter (HOSPITAL_BASED_OUTPATIENT_CLINIC_OR_DEPARTMENT_OTHER)
Admission: RE | Disposition: A | Discharge status: Home / Self Care | Payer: Self-pay | Source: Home / Self Care | Attending: General Surgery

## 2019-01-12 ENCOUNTER — Ambulatory Visit (HOSPITAL_BASED_OUTPATIENT_CLINIC_OR_DEPARTMENT_OTHER)
Admission: RE | Admit: 2019-01-12 | Discharge: 2019-01-12 | Disposition: A | Discharge status: Home / Self Care | Payer: Medicare HMO | Attending: General Surgery | Admitting: General Surgery

## 2019-01-12 ENCOUNTER — Other Ambulatory Visit: Payer: Self-pay

## 2019-01-12 ENCOUNTER — Ambulatory Visit (HOSPITAL_COMMUNITY): Payer: Medicare HMO

## 2019-01-12 ENCOUNTER — Ambulatory Visit (HOSPITAL_BASED_OUTPATIENT_CLINIC_OR_DEPARTMENT_OTHER): Payer: Medicare HMO | Admitting: Physician Assistant

## 2019-01-12 ENCOUNTER — Encounter (HOSPITAL_BASED_OUTPATIENT_CLINIC_OR_DEPARTMENT_OTHER): Payer: Self-pay | Admitting: General Surgery

## 2019-01-12 DIAGNOSIS — Z881 Allergy status to other antibiotic agents status: Secondary | ICD-10-CM | POA: Diagnosis not present

## 2019-01-12 DIAGNOSIS — Z880 Allergy status to penicillin: Secondary | ICD-10-CM | POA: Insufficient documentation

## 2019-01-12 DIAGNOSIS — Z419 Encounter for procedure for purposes other than remedying health state, unspecified: Secondary | ICD-10-CM

## 2019-01-12 DIAGNOSIS — Z7982 Long term (current) use of aspirin: Secondary | ICD-10-CM | POA: Insufficient documentation

## 2019-01-12 DIAGNOSIS — Z882 Allergy status to sulfonamides status: Secondary | ICD-10-CM | POA: Insufficient documentation

## 2019-01-12 DIAGNOSIS — M533 Sacrococcygeal disorders, not elsewhere classified: Secondary | ICD-10-CM | POA: Diagnosis not present

## 2019-01-12 DIAGNOSIS — Z79899 Other long term (current) drug therapy: Secondary | ICD-10-CM | POA: Diagnosis not present

## 2019-01-12 DIAGNOSIS — Z9221 Personal history of antineoplastic chemotherapy: Secondary | ICD-10-CM | POA: Insufficient documentation

## 2019-01-12 DIAGNOSIS — Z9884 Bariatric surgery status: Secondary | ICD-10-CM | POA: Diagnosis not present

## 2019-01-12 DIAGNOSIS — Z888 Allergy status to other drugs, medicaments and biological substances status: Secondary | ICD-10-CM | POA: Insufficient documentation

## 2019-01-12 DIAGNOSIS — Z85048 Personal history of other malignant neoplasm of rectum, rectosigmoid junction, and anus: Secondary | ICD-10-CM | POA: Insufficient documentation

## 2019-01-12 DIAGNOSIS — I252 Old myocardial infarction: Secondary | ICD-10-CM | POA: Diagnosis not present

## 2019-01-12 DIAGNOSIS — R159 Full incontinence of feces: Secondary | ICD-10-CM | POA: Diagnosis not present

## 2019-01-12 DIAGNOSIS — I2510 Atherosclerotic heart disease of native coronary artery without angina pectoris: Secondary | ICD-10-CM | POA: Diagnosis not present

## 2019-01-12 DIAGNOSIS — Z923 Personal history of irradiation: Secondary | ICD-10-CM | POA: Insufficient documentation

## 2019-01-12 HISTORY — DX: Malignant (primary) neoplasm, unspecified: C80.1

## 2019-01-12 HISTORY — DX: Anemia, unspecified: D64.9

## 2019-01-12 HISTORY — DX: Vitamin D deficiency, unspecified: E55.9

## 2019-01-12 LAB — POCT I-STAT, CHEM 8
BUN: 22 mg/dL (ref 8–23)
Calcium, Ion: 1.22 mmol/L (ref 1.15–1.40)
Chloride: 105 mmol/L (ref 98–111)
Creatinine, Ser: 0.6 mg/dL (ref 0.44–1.00)
Glucose, Bld: 80 mg/dL (ref 70–99)
HCT: 39 % (ref 36.0–46.0)
Hemoglobin: 13.3 g/dL (ref 12.0–15.0)
Potassium: 3.8 mmol/L (ref 3.5–5.1)
Sodium: 141 mmol/L (ref 135–145)
TCO2: 28 mmol/L (ref 22–32)

## 2019-01-12 SURGERY — INSERTION, NEUROSTIMULATOR, SACRAL
Anesthesia: Monitor Anesthesia Care | Site: Back

## 2019-01-12 MED ORDER — LIDOCAINE 2% (20 MG/ML) 5 ML SYRINGE
INTRAMUSCULAR | Status: AC
  Filled 2019-01-12: qty 5

## 2019-01-12 MED ORDER — TRAMADOL HCL 50 MG PO TABS: 50.0000 mg | ORAL_TABLET | Freq: Four times a day (QID) | ORAL | 0 refills | Status: DC | PRN

## 2019-01-12 MED ORDER — KETOROLAC TROMETHAMINE 30 MG/ML IJ SOLN
INTRAMUSCULAR | Status: AC
  Filled 2019-01-12: qty 1

## 2019-01-12 MED ORDER — FENTANYL CITRATE (PF) 100 MCG/2ML IJ SOLN
INTRAMUSCULAR | Status: DC | PRN
  Administered 2019-01-12 (×4): 25 ug via INTRAVENOUS

## 2019-01-12 MED ORDER — MEPERIDINE HCL 25 MG/ML IJ SOLN
6.2500 mg | INTRAMUSCULAR | Status: DC | PRN
  Filled 2019-01-12: qty 1

## 2019-01-12 MED ORDER — LACTATED RINGERS IV SOLN
INTRAVENOUS | Status: DC
  Filled 2019-01-12: qty 1000

## 2019-01-12 MED ORDER — ONDANSETRON HCL 4 MG/2ML IJ SOLN
INTRAMUSCULAR | Status: DC | PRN
  Administered 2019-01-12: 4 mg via INTRAVENOUS

## 2019-01-12 MED ORDER — ONDANSETRON HCL 4 MG/2ML IJ SOLN
4.0000 mg | Freq: Once | INTRAMUSCULAR | Status: DC | PRN
  Filled 2019-01-12: qty 2

## 2019-01-12 MED ORDER — FENTANYL CITRATE (PF) 100 MCG/2ML IJ SOLN
INTRAMUSCULAR | Status: AC
  Filled 2019-01-12: qty 2

## 2019-01-12 MED ORDER — MIDAZOLAM HCL 5 MG/5ML IJ SOLN
INTRAMUSCULAR | Status: DC | PRN
  Administered 2019-01-12: 2 mg via INTRAVENOUS

## 2019-01-12 MED ORDER — ACETAMINOPHEN 500 MG PO TABS
ORAL_TABLET | ORAL | Status: AC
  Filled 2019-01-12: qty 2

## 2019-01-12 MED ORDER — KETOROLAC TROMETHAMINE 30 MG/ML IJ SOLN
INTRAMUSCULAR | Status: DC | PRN
  Administered 2019-01-12: 30 mg via INTRAVENOUS

## 2019-01-12 MED ORDER — DEXAMETHASONE SODIUM PHOSPHATE 4 MG/ML IJ SOLN
INTRAMUSCULAR | Status: DC | PRN
  Administered 2019-01-12: 5 mg via INTRAVENOUS

## 2019-01-12 MED ORDER — PROPOFOL 500 MG/50ML IV EMUL
INTRAVENOUS | Status: DC | PRN
  Administered 2019-01-12: 25 ug/kg/min via INTRAVENOUS

## 2019-01-12 MED ORDER — CLINDAMYCIN PHOSPHATE 900 MG/50ML IV SOLN
900.0000 mg | INTRAVENOUS | Status: AC
  Administered 2019-01-12: 14:00:00 900 mg via INTRAVENOUS
  Filled 2019-01-12: qty 50

## 2019-01-12 MED ORDER — PROPOFOL 500 MG/50ML IV EMUL
INTRAVENOUS | Status: AC
  Filled 2019-01-12: qty 50

## 2019-01-12 MED ORDER — ACETAMINOPHEN 500 MG PO TABS
1000.0000 mg | ORAL_TABLET | ORAL | Status: AC
  Administered 2019-01-12: 1000 mg via ORAL
  Filled 2019-01-12: qty 2

## 2019-01-12 MED ORDER — OXYCODONE HCL 5 MG PO TABS
5.0000 mg | ORAL_TABLET | Freq: Once | ORAL | Status: DC | PRN
  Filled 2019-01-12: qty 1

## 2019-01-12 MED ORDER — BUPIVACAINE HCL (PF) 0.5 % IJ SOLN
INTRAMUSCULAR | Status: DC | PRN
  Administered 2019-01-12: 30 mL

## 2019-01-12 MED ORDER — HYDROMORPHONE HCL 1 MG/ML IJ SOLN
0.2500 mg | INTRAMUSCULAR | Status: DC | PRN
  Filled 2019-01-12: qty 0.5

## 2019-01-12 MED ORDER — LIDOCAINE HCL (CARDIAC) PF 100 MG/5ML IV SOSY
PREFILLED_SYRINGE | INTRAVENOUS | Status: DC | PRN
  Administered 2019-01-12: 60 mg via INTRAVENOUS

## 2019-01-12 MED ORDER — MIDAZOLAM HCL 2 MG/2ML IJ SOLN
INTRAMUSCULAR | Status: AC
  Filled 2019-01-12: qty 2

## 2019-01-12 MED ORDER — KETOROLAC TROMETHAMINE 30 MG/ML IJ SOLN
30.0000 mg | Freq: Once | INTRAMUSCULAR | Status: DC | PRN
  Filled 2019-01-12: qty 1

## 2019-01-12 MED ORDER — BUPIVACAINE HCL (PF) 0.5 % IJ SOLN
INTRAMUSCULAR | Status: AC
  Filled 2019-01-12: qty 30

## 2019-01-12 MED ORDER — SODIUM CHLORIDE 0.9% FLUSH
3.0000 mL | Freq: Two times a day (BID) | INTRAVENOUS | Status: DC
  Filled 2019-01-12: qty 3

## 2019-01-12 MED ORDER — DEXAMETHASONE SODIUM PHOSPHATE 10 MG/ML IJ SOLN
INTRAMUSCULAR | Status: AC
  Filled 2019-01-12: qty 1

## 2019-01-12 MED ORDER — ONDANSETRON HCL 4 MG/2ML IJ SOLN
INTRAMUSCULAR | Status: AC
  Filled 2019-01-12: qty 2

## 2019-01-12 MED ORDER — CLINDAMYCIN PHOSPHATE 900 MG/50ML IV SOLN
INTRAVENOUS | Status: AC
  Filled 2019-01-12: qty 50

## 2019-01-12 MED ORDER — OXYCODONE HCL 5 MG/5ML PO SOLN
5.0000 mg | Freq: Once | ORAL | Status: DC | PRN
  Filled 2019-01-12: qty 5

## 2019-01-12 SURGICAL SUPPLY — 51 items
ADH SKN CLS APL DERMABOND .7 (GAUZE/BANDAGES/DRESSINGS) ×1
APL PRP STRL LF DISP 70% ISPRP (MISCELLANEOUS) ×1
BAG URINE LEG 500ML (DRAIN) IMPLANT
BLADE HEX COATED 2.75 (ELECTRODE) ×2 IMPLANT
BLADE SURG 15 STRL LF DISP TIS (BLADE) ×1 IMPLANT
BLADE SURG 15 STRL SS (BLADE) ×2
CABLE EXTEN 4.32 INTERSTIM (NEUROSURGERY SUPPLIES) ×2 IMPLANT
CABLE TEST STIMULATION (UROLOGICAL SUPPLIES) IMPLANT
CABLE TWIST LOCK 25CM (UROLOGICAL SUPPLIES) IMPLANT
CHLORAPREP W/TINT 26 (MISCELLANEOUS) ×2 IMPLANT
COVER BACK TABLE 60X90IN (DRAPES) ×2 IMPLANT
COVER MAYO STAND STRL (DRAPES) ×2 IMPLANT
COVER PROBE W GEL 5X96 (DRAPES) IMPLANT
COVER WAND RF STERILE (DRAPES) ×2 IMPLANT
DERMABOND ADVANCED (GAUZE/BANDAGES/DRESSINGS) ×1
DERMABOND ADVANCED .7 DNX12 (GAUZE/BANDAGES/DRESSINGS) ×1 IMPLANT
DRAPE C-ARM 42X72 X-RAY (DRAPES) ×2 IMPLANT
DRAPE INCISE IOBAN 66X45 STRL (DRAPES) ×2 IMPLANT
DRAPE LAPAROSCOPIC ABDOMINAL (DRAPES) ×2 IMPLANT
DRAPE SHEET LG 3/4 BI-LAMINATE (DRAPES) ×2 IMPLANT
DRAPE UTILITY XL STRL (DRAPES) ×2 IMPLANT
DRSG TEGADERM 4X4.75 (GAUZE/BANDAGES/DRESSINGS) ×2 IMPLANT
DRSG TEGADERM 8X12 (GAUZE/BANDAGES/DRESSINGS) ×1 IMPLANT
ELECT REM PT RETURN 9FT ADLT (ELECTROSURGICAL) ×2
ELECTRODE REM PT RTRN 9FT ADLT (ELECTROSURGICAL) ×1 IMPLANT
GAUZE SPONGE 4X4 12PLY STRL (GAUZE/BANDAGES/DRESSINGS) IMPLANT
GLOVE BIO SURGEON STRL SZ 6.5 (GLOVE) ×2 IMPLANT
GLOVE BIOGEL PI IND STRL 7.0 (GLOVE) ×1 IMPLANT
GLOVE BIOGEL PI INDICATOR 7.0 (GLOVE) ×1
GOWN STRL REUS W/TWL XL LVL3 (GOWN DISPOSABLE) ×2 IMPLANT
INTRODUCER GUIDE DILATR SHEATH (SET/KITS/TRAYS/PACK) IMPLANT
KIT TURNOVER CYSTO (KITS) ×2 IMPLANT
LEAD INTERSTIM 4.32 28 L (Lead) ×1 IMPLANT
NEEDLE HYPO 22GX1.5 SAFETY (NEEDLE) ×2 IMPLANT
NEUROSTIMULATOR 1.7X2X.06 (UROLOGICAL SUPPLIES) ×1 IMPLANT
PACK BASIN DAY SURGERY FS (CUSTOM PROCEDURE TRAY) ×2 IMPLANT
PAD ARMBOARD 7.5X6 YLW CONV (MISCELLANEOUS) IMPLANT
PENCIL BUTTON HOLSTER BLD 10FT (ELECTRODE) ×2 IMPLANT
PROGRAMMER SMART TH90G01 (UROLOGICAL SUPPLIES) IMPLANT
STRIP CLOSURE SKIN 1/2X4 (GAUZE/BANDAGES/DRESSINGS) ×2 IMPLANT
SUT SILK 2 0 PERMA HAND 18 BK (SUTURE) ×1 IMPLANT
SUT SILK 2 0 TIES 17X18 (SUTURE)
SUT SILK 2-0 18XBRD TIE BLK (SUTURE) IMPLANT
SUT VIC AB 3-0 SH 27 (SUTURE) ×2
SUT VIC AB 3-0 SH 27X BRD (SUTURE) ×1 IMPLANT
SUT VICRYL 4-0 PS2 18IN ABS (SUTURE) ×2 IMPLANT
SYR BULB IRRIGATION 50ML (SYRINGE) ×2 IMPLANT
SYR CONTROL 10ML LL (SYRINGE) ×2 IMPLANT
TOWEL OR 17X26 10 PK STRL BLUE (TOWEL DISPOSABLE) ×4 IMPLANT
TUBE CONNECTING 12X1/4 (SUCTIONS) IMPLANT
WATER STERILE IRR 500ML POUR (IV SOLUTION) ×2 IMPLANT

## 2019-01-12 NOTE — Anesthesia Procedure Notes (Signed)
Procedure Name: MAC Date/Time: 01/12/2019 1:56 PM Performed by: Justice Rocher, CRNA Pre-anesthesia Checklist: Patient identified, Suction available, Patient being monitored, Emergency Drugs available and Timeout performed Patient Re-evaluated:Patient Re-evaluated prior to induction Oxygen Delivery Method: Simple face mask Preoxygenation: Pre-oxygenation with 100% oxygen Induction Type: IV induction Placement Confirmation: breath sounds checked- equal and bilateral and positive ETCO2

## 2019-01-12 NOTE — Discharge Instructions (Addendum)
POST OP INSTRUCTIONS  Always review your discharge instruction sheet given to you by the facility where your surgery was performed.   1. A prescription for pain medication may be given to you upon discharge. Take your pain medication as prescribed, if needed. If narcotic pain medicine is not needed, then you make take acetaminophen (Tylenol) or ibuprofen (Advil) as needed.  2. Take your usually prescribed medications unless otherwise directed. 3. If you need a refill on your pain medication, please contact our office. All narcotic pain medicine now requires a paper prescription.  Phoned in and fax refills are no longer allowed by law.  Prescriptions will not be filled after 5 pm or on weekends.  4. You should follow a light diet for the remainder of the day after your procedure. 5. Most patients will experience some mild swelling and/or bruising in the area of the incision. It may take several days to resolve. 6. It is common to experience some constipation if taking pain medication after surgery. Increasing fluid intake and taking a stool softener (such as Colace) will usually help or prevent this problem from occurring. A mild laxative (Milk of Magnesia or Miralax) should be taken according to package directions if there are no bowel movements after 48 hours.  7. Unless discharge instructions indicate otherwise, you may remove your bandages 48 hours after surgery, and you may shower at that time. You have Dermabond (skin glue) on the incision and a water proof dressing over your device.  The glue will flake off over the next 2-3 weeks.  Do not let the dressing get wet.  Sponge baths only 8. ACTIVITIES:  Limit activity involving your arms for the next 72 hours. Do no strenuous exercise or activity for 1 week. You may drive when you are no longer taking prescription pain medication, you can comfortably wear a seatbelt, and you can maneuver your car. 9.You may need to see your doctor in the office for a  follow-up appointment.  Please check with your doctor.   WHEN TO CALL YOUR DOCTOR [phone]): 1. Fever over 101.0 2. Chills 3. Continued bleeding from incision 4. Increased redness and tenderness at the site 5. Shortness of breath, difficulty breathing   The clinic staff is available to answer your questions during regular business hours. Please don't hesitate to call and ask to speak to one of the nurses or medical assistants for clinical concerns. If you have a medical emergency, go to the nearest emergency room or call 911.  A surgeon from [facility] is always on call at the hospital.     For further information, please visit [email or web address]   Post Anesthesia Home Care Instructions  Activity: Get plenty of rest for the remainder of the day. A responsible individual must stay with you for 24 hours following the procedure.  For the next 24 hours, DO NOT: -Drive a car -Paediatric nurse -Drink alcoholic beverages -Take any medication unless instructed by your physician -Make any legal decisions or sign important papers.  Meals: Start with liquid foods such as gelatin or soup. Progress to regular foods as tolerated. Avoid greasy, spicy, heavy foods. If nausea and/or vomiting occur, drink only clear liquids until the nausea and/or vomiting subsides. Call your physician if vomiting continues.  Special Instructions/Symptoms: Your throat may feel dry or sore from the anesthesia or the breathing tube placed in your throat during surgery. If this causes discomfort, gargle with warm salt water. The discomfort should disappear within 24 hours.

## 2019-01-12 NOTE — Op Note (Signed)
01/12/2019  3:22 PM  PATIENT:  Tonya [name]  69 y.o. female  Patient Care Team: Lucille Passy, MD as PCP - General (Family Medicine) Irene Shipper, MD as Consulting Physician (Gastroenterology) Leighton Ruff, MD as Consulting Physician (General Surgery) Truitt Merle, MD as Consulting Physician (Hematology) Kyung Rudd, MD as Consulting Physician (Radiation Oncology)  PRE-OPERATIVE DIAGNOSIS:  FECAL INCONTINENCE  POST-OPERATIVE DIAGNOSIS:  FECAL INCONTINENCE  PROCEDURE:   [place], TEST PHASE   Surgeon(s): Leighton Ruff, MD  ASSISTANT: none   ANESTHESIA:   local and MAC  EBL:  Total I/O In: -  Out: 10 [Blood:10]  DRAINS: none   SPECIMEN:  No Specimen  DISPOSITION OF SPECIMEN:  N/A  COUNTS:  YES  PLAN OF CARE: Discharge to home after PACU  PATIENT DISPOSITION:  PACU - hemodynamically stable.  INDICATION: Fecal incontinence refractory to medical treatment.  The patient suffers from fecal incontinence ~ 13 per day.  she has tried imodium, lomotil, tincture of opium and fiber supplements to manage this without success.  Anal manometry shows weak squeeze with normal resting pressure and decreased compliance.  The risks and benefits of the surgery were described to the patient and consent was signed and placed on chart prior to the OR.  DESCRIPTION: the patient was identified in the preoperative holding area and taken to the OR where they were laid prone on the operating room table.  MAC anesthesia was induced without difficulty. SCDs were also noted to be in place prior to the initiation of anesthesia.  Pillows were placed under lower abdomen to flatten the sacrum and under shins to allow the toes to dangle freely. A ground pad was placed on the bottom of the patient's foot and the proximal ends of the j-hook patient cable were connected to the ground pad and the external neurostimulator (ENS).The patient was then prepped and draped in the usual  sterile fashion.  A surgical timeout was performed indicating the correct patient, procedure, positioning and need for preoperative antibiotics.  The c-arm was moved into AP position to provide fluoroscopic guidance of the sacrum. The medial edges of the foramina were identified and marked. The c-arm was then moved into the lateral position to identify the S3 foramen. Once the needle entry point was determined, local injection of Marcaine with epinephrine was administered bilaterally. A foramen needle was placed in the superior, medial aspect of the right S3 foramen and appropriate needle depth was visualized utilizing fluoroscopy. Proper S3 needle location was also confirmed by direct observation of the lifting of the perineum or "bellowing," and plantar flexion of the great toe utilizing the j-hook patient cable, the external neurostimulator and Verify controller.  We got a reasonable single on the right side but decided to try the left to see if we can get a better signal.  This was done in similar fashion.  We were unable to get any better signal on the side and we decided to proceed with the right side for lead placement. The foramen needle stylet was removed and a directional guide was placed through the needle using markers on the guide to assure appropriate depth. The foramen needle was removed by sliding over the directional guide. A small incision was made peripherally to the directional guide through the skin. The lead introducer with dilator was placed over the directional guide and utilizing fluoroscopic guidance, the lead introducer was advanced until the radiopaque mark was half-way through the foramen. The dilator was removed along  with the directional guide. Using fluoroscopy, the tined lead with the bent stylet placed through the introducer until electrodes two and three straddled the anterior surface of the sacrum. All four electrodes were tested, observing "bellows" and plantar flexion of the  great toe utilizing the j-hook patient cable and the external neurostimulator with Verify controller. After satisfactory lead positioning was confirmed, the introducer was retracted over the lead under continuous fluoroscopy, deploying the tines into presacral tissue.  The potential internal neurostimulator pocket site was identified below the iliac crest and lateral to the sacrum. Local anesthesia was administered and an incision was made into the subcutaneous tissue creating a connection site. Blunt dissection was used to create a small pocket with hemostasis achieved.  A tunneling tool with sheath was placed from the lead exit site subcutaneously to the small incised pocket site. The tunneling tool was removed and the lead was fed through the sheath, exiting at the pocket connection site. The sheath was removed. The lead was cleaned and dried. A protective boot was placed over the lead; the lead was inserted into the temporary percutaneous extension with visual confirmation of blue tip advancement. The setscrew was tightened with the torque wrench until audible clicks were heard.  Using the tunneling tool and sheath, a subcutaneous tunnel was created from the pocket site to the contralateral buttock and exited at a localized site. The percutaneous extension was placed through the sheath, the sheath removed, and the extension exiting the site.  The connection components were placed into the incision. The incisions were closed with 4-0 Vicryl subcuticular sutures. The incisions were closed with Dermabond.  Adhesive strips and gauze were placed over the percutaneous extension exit site and secured with transparent dressing.   The percutaneous extension was attached to the external white twist lock cable. Gauze was placed under the twist-lock connection with cable strain relief and secured using transparent dressing. The twist lock cable was then plugged into the external neuorstimulator. The patient was  transferred to PACU in satisfactory condition. Using the Verify controller and external neurostimulator, the patient was programmed to the electrode of optimum sensation and provided utilization instructions prior to discharge. Patient will complete a bowel diary during testing period to help document results of this test procedure.

## 2019-01-12 NOTE — H&P (Signed)
The patient is a 69 year old female who presents with fecal incontinence. 69 year old female who presents to the office for evaluation of fecal incontinence after low anterior resection. This was done at [facility]. Since that time she has had chronic diarrhea and difficulty with fecal leakage and urgency as well as complete incontinence. 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 She denies any rectal bleeding or pain. She has completed chemotherapy and radiation and is currently in surveillance for her rectal cancer. She has tried maximum dose Imodium and Lomotil as well as Citrucel fiber supplements. She continues to have loose pudding consistency type stools that occur throughout the day. I did have her complete a bowel diary. She has moderate to heavy amounts of leakage that occur approximately 13 times a day. 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 This was completed in January 2020. She has noted a slight improvement in her stools. She is no longer having watery diarrhea, but she continues to have unformed bowel movements. She is here today to evaluate anything further that can be done to avoid an ostomy. She underwent manometry recently which shows normal resting pressure and decreased squeeze. She does have decreased compliance as well.   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 Phenergan *ANTIHISTAMINES* Sulfa Antibiotics Anaphylaxis. Penicillins Allergies  Reconciled  Medication History  Creon (36000UNIT Capsule DR Part, 1 (one) Oral 3 times a day, Taken starting 01/31/2018) Active. (take with meals) Cholestyramine (4GM Packet, 1 (one) Oral before every meal, Taken starting 01/31/2018) Active. Lomotil (2.5-0.025MG  Tablet, 1 (one) Oral four times daily, Taken starting 01/10/2018) Active. Codeine Sulfate (15MG  Tablet, Oral) Active. Viberzi (100MG  Tablet, Oral) Active. Estradiol (0.1MG /GM Cream, Vaginal) Active. Imodium A-D (Oral) Specific strength unknown - Active. Citracal Maximum (Oral) Specific strength unknown - Active. Vitamin D (Ergocalciferol) (50000UNIT Capsule, Oral) Active. Aspirin (81MG  Tablet, Oral) Active. Medications Reconciled  Social History Leighton Ruff, MD; 99991111 10:26 AM) Caffeine use Coffee, Tea. No alcohol use No drug use Tobacco use Never smoker.  Family History Leighton Ruff, MD; 99991111 10:26 AM) Cancer Mother, Sister. Alcohol Abuse Father. Diabetes Mellitus Father, Mother, Sister. Heart Disease Father, Mother, Sister. Migraine Headache Sister. Thyroid problems Sister. Heart disease in female family member before age 57 Hypertension Mother.  Pregnancy / Birth History Leighton Ruff, MD; 99991111 10:26 AM) Age at menarche 71 years, 89 years. Age of menopause <47 Gravida 0 Para 0  Other Problems Leighton Ruff, MD; 99991111 10:26 AM) Myocardial infarction Rectal Cancer     Review of Systems  General Present- Appetite Loss, Fatigue and Weight Loss. Not Present- Chills, Fever, Night Sweats and Weight Gain. Skin Not Present- Change in Wart/Mole, Dryness, Hives, Jaundice, New Lesions, Non-Healing Wounds, Rash and Ulcer. HEENT Present- Wears glasses/contact lenses. Not Present- Earache, Hearing Loss, Hoarseness, Nose Bleed, Oral Ulcers, Ringing in the Ears, Seasonal Allergies, Sinus Pain, Sore Throat, Visual Disturbances and Yellow Eyes. Respiratory Not  Present- Bloody sputum, Chronic Cough, Difficulty Breathing, Snoring and Wheezing. Breast Not Present- Breast Mass, Breast Pain, Nipple Discharge and Skin Changes. Cardiovascular Not Present- Chest  Pain, Difficulty Breathing Lying Down, Leg Cramps, Palpitations, Rapid Heart Rate, Shortness of Breath and Swelling of Extremities. Gastrointestinal Present- Bloody Stool, Change in Bowel Habits, Excessive gas, Hemorrhoids and Rectal Pain. Not Present- Abdominal Pain, Bloating, Chronic diarrhea, Constipation, Difficulty Swallowing, Gets full quickly at meals, Indigestion, Nausea and Vomiting. Female Genitourinary Not Present- Frequency, Nocturia, Painful Urination, Pelvic Pain and Urgency. Musculoskeletal Not Present- Back Pain, Joint Pain, Joint Stiffness, Muscle Pain, Muscle Weakness and Swelling of Extremities. Neurological Not Present- Decreased Memory, Fainting, Headaches, Numbness, Seizures, Tingling, Tremor, Trouble walking and Weakness. Psychiatric Not Present- Anxiety, Bipolar, Change in Sleep Pattern, Depression, Fearful and Frequent crying. Endocrine Not Present- Cold Intolerance, Excessive Hunger, Hair Changes, Heat Intolerance, Hot flashes and New Diabetes. Hematology Not Present- Blood Thinners, Easy Bruising, Excessive bleeding, Gland problems, HIV and Persistent Infections.   BP (!) 146/71   Pulse 75   Temp 98 F (36.7 C) (Oral)   Resp 18   Ht 5' (1.524 m)   Wt 75.6 kg   SpO2 99%   BMI 32.56 kg/m     Physical Exam  General Mental Status-Alert. General Appearance-Not in acute distress. Build & Nutrition-Well nourished. Posture-Normal posture. Gait-Normal.  Head and Neck Head-normocephalic, atraumatic with no lesions or palpable masses. Trachea-midline.  Chest and Lung Exam Chest and lung exam reveals -on auscultation, normal breath sounds, no adventitious sounds and normal vocal resonance.  Cardiovascular Cardiovascular examination reveals  -normal heart sounds, regular rate and rhythm with no murmurs and no digital clubbing, cyanosis, edema, increased warmth or tenderness.  Abdomen Inspection Inspection of the abdomen reveals - No Hernias. Palpation/Percussion Palpation and Percussion of the abdomen reveal - Soft, Non Tender, No Rigidity (guarding), No hepatosplenomegaly and No Palpable abdominal masses.  Rectal Note: Decreased squeeze pressure  Neurologic Neurologic evaluation reveals -alert and oriented x 3 with no impairment of recent or remote memory, normal attention span and ability to concentrate, normal sensation and normal coordination.  Musculoskeletal Normal Exam - Bilateral-Upper Extremity Strength Normal and Lower Extremity Strength Normal.    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 She has chronically loose stools that are intractable to medical management. She also has some decreased sphincter tone. Manometry shows weak squeeze with normal resting pressure and decreased compliance. She did complete a bowel diary, which shows leakage occurring averaging 13 times a day. She has exhausted all other medical management and is requesting evaluation for sacral nerve stimulator. I think she is a reasonable candidate for this. I have discussed this in detail including risk of bleeding, infection and failure of the device. I believe she understands this and wishes to proceed. We discussed that she will need a 2-stage procedure with approximately 1 week in between.

## 2019-01-12 NOTE — Anesthesia Postprocedure Evaluation (Signed)
Anesthesia Post Note  Patient: Tonya [name]  Procedure(s) Performed: SACRAL NERVE STIMULATOR IMPLANTATION, TEST PHASE (N/A Back)     Patient location during evaluation: PACU Anesthesia Type: MAC Level of consciousness: awake and alert Pain management: pain level controlled Vital Signs Assessment: post-procedure vital signs reviewed and stable Respiratory status: spontaneous breathing, nonlabored ventilation and respiratory function stable Cardiovascular status: blood pressure returned to baseline and stable Postop Assessment: no apparent nausea or vomiting Anesthetic complications: no    Last Vitals:  Vitals:   01/12/19 1527 01/12/19 1530  BP: 137/61 136/69  Pulse: 65 70  Resp: 12 10  Temp: 36.7 C   SpO2: 98% 98%    Last Pain:  Vitals:   01/12/19 1527  TempSrc:   PainSc: 0-No pain                 Pervis Hocking

## 2019-01-12 NOTE — Anesthesia Preprocedure Evaluation (Signed)
Anesthesia Evaluation  Patient identified by MRN, date of birth, ID band Patient awake    Reviewed: Allergy & Precautions, NPO status , Patient's Chart, lab work & pertinent test results  Airway Mallampati: II  TM Distance: >3 FB Neck ROM: Full    Dental no notable dental hx.    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 No cardiologist currently  Last echo 2015: Left ventricle: The cavity size was normal. Wall thickness  was increased in a pattern of mild LVH. Systolic function  was normal. The estimated ejection fraction was in the range  of 55% to 60%. Wall motion was normal; there were no  regional wall motion abnormalities. Doppler parameters are  consistent with abnormal left ventricular relaxation (grade  1 diastolic dysfunction).      Neuro/Psych negative neurological ROS  negative psych ROS   GI/Hepatic Neg liver ROS, Fecal incontinence, hx rectal adenoca  S/p gastric bypass 2003   Endo/Other  Obesity BMI 33, s/p gastric bypass  Renal/GU negative Renal ROS  negative genitourinary   Musculoskeletal negative musculoskeletal ROS (+)   Abdominal (+) + obese,   Peds  Hematology negative hematology ROS (+)   Anesthesia Other Findings Day of surgery medications reviewed with the patient.  Reproductive/Obstetrics negative OB ROS                             Anesthesia Physical Anesthesia Plan  ASA: III  Anesthesia Plan: MAC   Post-op Pain Management:    Induction: Intravenous  PONV Risk Score and Plan: 2 and Propofol infusion, TIVA and Treatment may vary due to age or medical condition  Airway Management Planned: Simple Face Mask and Natural Airway  Additional Equipment: None  Intra-op Plan:    Post-operative Plan: Extubation in OR  Informed Consent: I have reviewed the patients History and Physical, chart, labs and discussed the procedure including the risks, benefits and alternatives for the proposed anesthesia with the patient or authorized representative who has indicated his/her understanding and acceptance.     Dental advisory given  Plan Discussed with: CRNA  Anesthesia Plan Comments:         Anesthesia Quick Evaluation

## 2019-01-12 NOTE — Transfer of Care (Signed)
Immediate Anesthesia Transfer of Care Note  Patient: Tonya [name]  Procedure(s) Performed: Procedure(s) (LRB): SACRAL NERVE STIMULATOR IMPLANTATION, TEST PHASE (N/A)  Patient Location: PACU  Anesthesia Type: General  Level of Consciousness: awake, sedated, patient cooperative and responds to stimulation  Airway & Oxygen Therapy: Patient Spontanous Breathing and Patient connected to RA and soft FM   Post-op Assessment: Report given to PACU RN, Post -op Vital signs reviewed and stable and Patient moving all extremities  Post vital signs: Reviewed and stable  Complications: No apparent anesthesia complications

## 2019-01-15 ENCOUNTER — Other Ambulatory Visit: Payer: Self-pay

## 2019-01-15 ENCOUNTER — Encounter (HOSPITAL_BASED_OUTPATIENT_CLINIC_OR_DEPARTMENT_OTHER): Payer: Self-pay | Admitting: General Surgery

## 2019-01-15 NOTE — Progress Notes (Signed)
Spoke w/ via phone for pre-op interview--- PT Lab needs dos----   none            Lab results------ Istat 8 dated 01-12-2019 in epic/ chart COVID test ------ 01-16-2019 @ 0905 Arrive at ------- 0530 NPO after ------ MN Medications to take morning of surgery ----- lomotil if needed w/ sips of water Diabetic medication ----- n/a Patient Special Instructions ----- n/a Pre-Op special Istructions ----- n/a Patient verbalized understanding of instructions that were given at this phone interview. Patient denies shortness of breath, chest pain, fever, cough a this phone interview.   Anesthesia Review:   Pt first stage of this surgery done by Dr Marcello Moores 01-09-2019 @[facility] .  Pt chart was reviewed at that by anesthesia was ok to proceed.  Per pt had no issues/ problems w/ what anesthesia was used on 01-12-2019.  PCP: Dr Arnette Norris Cardiologist : no Chest x-ray : CT 10-02-2018 epic EKG :  none Echo : none Cardiac Cath :  none Sleep Study/ CPAP :  NO  Blood Thinner/ Instructions Maryjane Hurter Dose: NO ASA / Instructions/ Last Dose :  ASA 81mg /  Per Dr Marcello Moores instructions pt to continue asa

## 2019-01-16 ENCOUNTER — Other Ambulatory Visit (HOSPITAL_COMMUNITY)
Admission: RE | Admit: 2019-01-16 | Discharge: 2019-01-16 | Disposition: A | Discharge status: Home / Self Care | Payer: Medicare HMO | Source: Ambulatory Visit | Attending: General Surgery | Admitting: General Surgery

## 2019-01-16 DIAGNOSIS — Z20822 Contact with and (suspected) exposure to covid-19: Secondary | ICD-10-CM | POA: Insufficient documentation

## 2019-01-16 DIAGNOSIS — Z01812 Encounter for preprocedural laboratory examination: Secondary | ICD-10-CM | POA: Insufficient documentation

## 2019-01-17 LAB — NOVEL CORONAVIRUS, NAA (HOSP ORDER, SEND-OUT TO REF LAB; TAT 18-24 HRS): SARS-CoV-2, NAA: NOT DETECTED

## 2019-01-18 NOTE — Anesthesia Preprocedure Evaluation (Addendum)
Anesthesia Evaluation  Patient identified by MRN, date of birth, ID band Patient awake    Reviewed: Allergy & Precautions, NPO status , Patient's Chart, lab work & pertinent test results  History of Anesthesia Complications Negative for: history of anesthetic complications  Airway Mallampati: III  TM Distance: >3 FB Neck ROM: Full    Dental  (+) Dental Advisory Given, Teeth Intact   Pulmonary neg pulmonary ROS,    Pulmonary exam normal        Cardiovascular Exercise Tolerance: Good (-) angina+ Past MI (in her 20's, no longer sees cardiology)  Normal cardiovascular exam     Neuro/Psych negative neurological ROS  negative psych ROS   GI/Hepatic Neg liver ROS,  Fecal incontinence S/p gastric bypass    Endo/Other   Obesity   Renal/GU negative Renal ROS     Musculoskeletal negative musculoskeletal ROS (+)   Abdominal   Peds  Hematology negative hematology ROS (+)   Anesthesia Other Findings Covid neg 1/12  Reproductive/Obstetrics                            Anesthesia Physical Anesthesia Plan  ASA: III  Anesthesia Plan: MAC   Post-op Pain Management:    Induction: Intravenous  PONV Risk Score and Plan: 2 and Propofol infusion and Treatment may vary due to age or medical condition  Airway Management Planned: Natural Airway and Simple Face Mask  Additional Equipment: None  Intra-op Plan:   Post-operative Plan:   Informed Consent: I have reviewed the patients History and Physical, chart, labs and discussed the procedure including the risks, benefits and alternatives for the proposed anesthesia with the patient or authorized representative who has indicated his/her understanding and acceptance.       Plan Discussed with: CRNA and Anesthesiologist  Anesthesia Plan Comments:        Anesthesia Quick Evaluation

## 2019-01-19 ENCOUNTER — Encounter (HOSPITAL_BASED_OUTPATIENT_CLINIC_OR_DEPARTMENT_OTHER): Payer: Self-pay | Admitting: General Surgery

## 2019-01-19 ENCOUNTER — Ambulatory Visit (HOSPITAL_BASED_OUTPATIENT_CLINIC_OR_DEPARTMENT_OTHER): Payer: Medicare HMO | Admitting: Physician Assistant

## 2019-01-19 ENCOUNTER — Encounter (HOSPITAL_BASED_OUTPATIENT_CLINIC_OR_DEPARTMENT_OTHER)
Admission: RE | Disposition: A | Discharge status: Home / Self Care | Payer: Self-pay | Source: Ambulatory Visit | Attending: General Surgery

## 2019-01-19 ENCOUNTER — Ambulatory Visit (HOSPITAL_BASED_OUTPATIENT_CLINIC_OR_DEPARTMENT_OTHER)
Admission: RE | Admit: 2019-01-19 | Discharge: 2019-01-19 | Disposition: A | Discharge status: Home / Self Care | Payer: Medicare HMO | Source: Ambulatory Visit | Attending: General Surgery | Admitting: General Surgery

## 2019-01-19 ENCOUNTER — Other Ambulatory Visit: Payer: Self-pay

## 2019-01-19 DIAGNOSIS — Z9884 Bariatric surgery status: Secondary | ICD-10-CM | POA: Diagnosis not present

## 2019-01-19 DIAGNOSIS — R159 Full incontinence of feces: Secondary | ICD-10-CM | POA: Diagnosis not present

## 2019-01-19 DIAGNOSIS — Z4542 Encounter for adjustment and management of neuropacemaker (brain) (peripheral nerve) (spinal cord): Secondary | ICD-10-CM | POA: Diagnosis not present

## 2019-01-19 DIAGNOSIS — Z6833 Body mass index (BMI) 33.0-33.9, adult: Secondary | ICD-10-CM | POA: Insufficient documentation

## 2019-01-19 DIAGNOSIS — I252 Old myocardial infarction: Secondary | ICD-10-CM | POA: Insufficient documentation

## 2019-01-19 DIAGNOSIS — E669 Obesity, unspecified: Secondary | ICD-10-CM | POA: Diagnosis not present

## 2019-01-19 DIAGNOSIS — Z7982 Long term (current) use of aspirin: Secondary | ICD-10-CM | POA: Insufficient documentation

## 2019-01-19 DIAGNOSIS — Z79899 Other long term (current) drug therapy: Secondary | ICD-10-CM | POA: Insufficient documentation

## 2019-01-19 DIAGNOSIS — Z86010 Personal history of colonic polyps: Secondary | ICD-10-CM | POA: Insufficient documentation

## 2019-01-19 DIAGNOSIS — I10 Essential (primary) hypertension: Secondary | ICD-10-CM | POA: Diagnosis not present

## 2019-01-19 DIAGNOSIS — K629 Disease of anus and rectum, unspecified: Secondary | ICD-10-CM | POA: Insufficient documentation

## 2019-01-19 DIAGNOSIS — Z85048 Personal history of other malignant neoplasm of rectum, rectosigmoid junction, and anus: Secondary | ICD-10-CM | POA: Insufficient documentation

## 2019-01-19 DIAGNOSIS — I2510 Atherosclerotic heart disease of native coronary artery without angina pectoris: Secondary | ICD-10-CM | POA: Diagnosis not present

## 2019-01-19 SURGERY — INSERTION, NEUROSTIMULATOR, SACRAL
Anesthesia: Monitor Anesthesia Care | Site: Buttocks

## 2019-01-19 MED ORDER — OXYCODONE HCL 5 MG/5ML PO SOLN
5.0000 mg | Freq: Once | ORAL | Status: DC | PRN
  Filled 2019-01-19: qty 5

## 2019-01-19 MED ORDER — SODIUM CHLORIDE 0.9% FLUSH
3.0000 mL | Freq: Two times a day (BID) | INTRAVENOUS | Status: DC
  Filled 2019-01-19: qty 3

## 2019-01-19 MED ORDER — MIDAZOLAM HCL 2 MG/2ML IJ SOLN
INTRAMUSCULAR | Status: AC
  Filled 2019-01-19: qty 2

## 2019-01-19 MED ORDER — PROPOFOL 10 MG/ML IV BOLUS
INTRAVENOUS | Status: DC | PRN
  Administered 2019-01-19: 150 ug/kg/min via INTRAVENOUS

## 2019-01-19 MED ORDER — LIDOCAINE 2% (20 MG/ML) 5 ML SYRINGE
INTRAMUSCULAR | Status: DC | PRN
  Administered 2019-01-19: 50 mg via INTRAVENOUS

## 2019-01-19 MED ORDER — LACTATED RINGERS IV SOLN
INTRAVENOUS | Status: DC
  Filled 2019-01-19: qty 1000

## 2019-01-19 MED ORDER — LIDOCAINE 2% (20 MG/ML) 5 ML SYRINGE
INTRAMUSCULAR | Status: AC
  Filled 2019-01-19: qty 5

## 2019-01-19 MED ORDER — FENTANYL CITRATE (PF) 100 MCG/2ML IJ SOLN
25.0000 ug | INTRAMUSCULAR | Status: DC | PRN
  Filled 2019-01-19: qty 1

## 2019-01-19 MED ORDER — CLINDAMYCIN PHOSPHATE 900 MG/50ML IV SOLN
INTRAVENOUS | Status: DC | PRN
  Administered 2019-01-19: 900 mg via INTRAVENOUS

## 2019-01-19 MED ORDER — FENTANYL CITRATE (PF) 100 MCG/2ML IJ SOLN
INTRAMUSCULAR | Status: AC
  Filled 2019-01-19: qty 2

## 2019-01-19 MED ORDER — PROPOFOL 10 MG/ML IV BOLUS
INTRAVENOUS | Status: AC
  Filled 2019-01-19: qty 20

## 2019-01-19 MED ORDER — CLINDAMYCIN PHOSPHATE 900 MG/50ML IV SOLN
INTRAVENOUS | Status: AC
  Filled 2019-01-19: qty 50

## 2019-01-19 MED ORDER — ONDANSETRON HCL 4 MG/2ML IJ SOLN
4.0000 mg | Freq: Once | INTRAMUSCULAR | Status: DC | PRN
  Filled 2019-01-19: qty 2

## 2019-01-19 MED ORDER — SODIUM CHLORIDE 0.9 % IR SOLN
Status: DC | PRN
  Administered 2019-01-19: 500 mL

## 2019-01-19 MED ORDER — PROPOFOL 500 MG/50ML IV EMUL
INTRAVENOUS | Status: AC
  Filled 2019-01-19: qty 50

## 2019-01-19 MED ORDER — OXYCODONE HCL 5 MG PO TABS
5.0000 mg | ORAL_TABLET | Freq: Once | ORAL | Status: DC | PRN
  Filled 2019-01-19: qty 1

## 2019-01-19 SURGICAL SUPPLY — 48 items
ADH SKN CLS APL DERMABOND .7 (GAUZE/BANDAGES/DRESSINGS) ×1
APL PRP STRL LF DISP 70% ISPRP (MISCELLANEOUS) ×1
BAG URINE LEG 500ML (DRAIN) IMPLANT
BLADE HEX COATED 2.75 (ELECTRODE) ×2 IMPLANT
BLADE SURG 15 STRL LF DISP TIS (BLADE) ×1 IMPLANT
BLADE SURG 15 STRL SS (BLADE) ×2
CABLE TEST STIMULATION (UROLOGICAL SUPPLIES) IMPLANT
CABLE TWIST LOCK 25CM (UROLOGICAL SUPPLIES) IMPLANT
CHLORAPREP W/TINT 26 (MISCELLANEOUS) ×2 IMPLANT
COVER BACK TABLE 60X90IN (DRAPES) ×2 IMPLANT
COVER MAYO STAND STRL (DRAPES) ×2 IMPLANT
COVER PROBE W GEL 5X96 (DRAPES) IMPLANT
COVER WAND RF STERILE (DRAPES) ×4 IMPLANT
DERMABOND ADVANCED (GAUZE/BANDAGES/DRESSINGS) ×1
DERMABOND ADVANCED .7 DNX12 (GAUZE/BANDAGES/DRESSINGS) ×1 IMPLANT
DRAPE C-ARM 42X72 X-RAY (DRAPES) IMPLANT
DRAPE INCISE IOBAN 66X45 STRL (DRAPES) ×2 IMPLANT
DRAPE LAPAROSCOPIC ABDOMINAL (DRAPES) ×2 IMPLANT
DRAPE SHEET LG 3/4 BI-LAMINATE (DRAPES) IMPLANT
DRAPE UTILITY XL STRL (DRAPES) ×2 IMPLANT
DRSG TEGADERM 4X4.75 (GAUZE/BANDAGES/DRESSINGS) IMPLANT
ELECT REM PT RETURN 9FT ADLT (ELECTROSURGICAL) ×2
ELECTRODE REM PT RTRN 9FT ADLT (ELECTROSURGICAL) ×1 IMPLANT
GAUZE SPONGE 4X4 12PLY STRL (GAUZE/BANDAGES/DRESSINGS) IMPLANT
GAUZE SPONGE 4X4 12PLY STRL LF (GAUZE/BANDAGES/DRESSINGS) ×1 IMPLANT
GLOVE BIO SURGEON STRL SZ 6.5 (GLOVE) ×2 IMPLANT
GLOVE BIOGEL PI IND STRL 7.0 (GLOVE) ×1 IMPLANT
GLOVE BIOGEL PI INDICATOR 7.0 (GLOVE) ×1
GOWN STRL REUS W/TWL XL LVL3 (GOWN DISPOSABLE) ×2 IMPLANT
INTRODUCER GUIDE DILATR SHEATH (SET/KITS/TRAYS/PACK) IMPLANT
KIT TURNOVER CYSTO (KITS) ×2 IMPLANT
NEEDLE HYPO 22GX1.5 SAFETY (NEEDLE) ×2 IMPLANT
NEUROSTIMULATOR 1.7X2X.06 (UROLOGICAL SUPPLIES) IMPLANT
PACK BASIN DAY SURGERY FS (CUSTOM PROCEDURE TRAY) ×2 IMPLANT
PAD ARMBOARD 7.5X6 YLW CONV (MISCELLANEOUS) IMPLANT
PENCIL BUTTON HOLSTER BLD 10FT (ELECTRODE) ×2 IMPLANT
PROGRAMMER SMART TH90G01 (UROLOGICAL SUPPLIES) IMPLANT
STRIP CLOSURE SKIN 1/2X4 (GAUZE/BANDAGES/DRESSINGS) IMPLANT
SUT SILK 2 0 TIES 17X18 (SUTURE)
SUT SILK 2-0 18XBRD TIE BLK (SUTURE) IMPLANT
SUT VIC AB 3-0 SH 27 (SUTURE) ×2
SUT VIC AB 3-0 SH 27X BRD (SUTURE) ×1 IMPLANT
SUT VICRYL 4-0 PS2 18IN ABS (SUTURE) ×2 IMPLANT
SYR BULB IRRIGATION 50ML (SYRINGE) ×2 IMPLANT
SYR CONTROL 10ML LL (SYRINGE) ×2 IMPLANT
TOWEL OR 17X26 10 PK STRL BLUE (TOWEL DISPOSABLE) ×4 IMPLANT
TUBE CONNECTING 12X1/4 (SUCTIONS) IMPLANT
WATER STERILE IRR 500ML POUR (IV SOLUTION) ×2 IMPLANT

## 2019-01-19 NOTE — Anesthesia Postprocedure Evaluation (Signed)
Anesthesia Post Note  Patient: Tonya [name]  Procedure(s) Performed: REMOVAL OF SACRAL NERVE STIMULATOR TEST WIRE (N/A Buttocks)     Patient location during evaluation: PACU Anesthesia Type: MAC Level of consciousness: awake and alert Pain management: pain level controlled Vital Signs Assessment: post-procedure vital signs reviewed and stable Respiratory status: spontaneous breathing, nonlabored ventilation and respiratory function stable Cardiovascular status: stable and blood pressure returned to baseline Anesthetic complications: no    Last Vitals:  Vitals:   01/19/19 0812 01/19/19 0815  BP: 95/86 123/81  Pulse:  79  Resp: 15 14  Temp: 36.5 C   SpO2:  98%    Last Pain:  Vitals:   01/19/19 0815  TempSrc:   PainSc: 0-No pain                 Audry Pili

## 2019-01-19 NOTE — Transfer of Care (Addendum)
Immediate Anesthesia Transfer of Care Note  Patient: Tonya [name]  Procedure(s) Performed: Procedure(s) (LRB): REMOVAL OF SACRAL NERVE STIMULATOR TEST WIRE (N/A)  Patient Location: PACU  Anesthesia Type: MAC  Level of Consciousness: awake, oriented, sedated and patient cooperative  Airway & Oxygen Therapy: Patient Spontanous Breathing and Patient connected to face mask oxygen  Post-op Assessment: Report given to PACU RN and Post -op Vital signs reviewed and stable  Post vital signs: Reviewed and stable  Complications: No apparent anesthesia complications Last Vitals:  Vitals Value Taken Time  BP 138/70 01/19/19 0830  Temp 36.5 C 01/19/19 0812  Pulse 76 01/19/19 0832  Resp 16 01/19/19 0832  SpO2 99 % 01/19/19 0832  Vitals shown include unvalidated device data.  Last Pain:  Vitals:   01/19/19 0815  TempSrc:   PainSc: 0-No pain      Patients Stated Pain Goal: 5 (01/19/19 0555)

## 2019-01-19 NOTE — Addendum Note (Signed)
Addendum  created 01/19/19 0934 by Suan Halter, CRNA   Clinical Note Signed

## 2019-01-19 NOTE — Addendum Note (Signed)
Addendum  created 01/19/19 0911 by Suan Halter, CRNA   Flowsheet accepted, Intraprocedure Flowsheets edited

## 2019-01-19 NOTE — H&P (Signed)
The patient is a 69 year old female who presents with fecal incontinence. 69 year old female who presents to the office for evaluation of fecal incontinence after low anterior resection. This was done at [facility]. Since that time she has had chronic diarrhea and difficulty with fecal leakage and urgency as well as complete incontinence. 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 She denies any rectal bleeding or pain. She has completed chemotherapy and radiation and is currently in surveillance for her rectal cancer. She has tried maximum dose Imodium and Lomotil as well as Citrucel fiber supplements. She continues to have loose pudding consistency type stools that occur throughout the day. I did have her complete a bowel diary. She has moderate to heavy amounts of leakage that occur approximately 13 times a day. 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 This was completed in January 2020. She has noted a slight improvement in her stools. She is no longer having watery diarrhea, but she continues to have unformed bowel movements. We decided to try a SNS test wire last week.  Unfortunately she has seen little improvement over the last week.     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 Phenergan *ANTIHISTAMINES* Sulfa Antibiotics Anaphylaxis. Penicillins Allergies Reconciled  Medication History  Creon (36000UNIT Capsule DR Part, 1 (one) Oral 3 times a day, Taken  starting 01/31/2018) Active. (take with meals) Cholestyramine (4GM Packet, 1 (one) Oral before every meal, Taken starting 01/31/2018) Active. Lomotil (2.5-0.025MG  Tablet, 1 (one) Oral four times daily, Taken starting 01/10/2018) Active. Codeine Sulfate (15MG  Tablet, Oral) Active. Viberzi (100MG  Tablet, Oral) Active. Estradiol (0.1MG /GM Cream, Vaginal) Active. Imodium A-D (Oral) Specific strength unknown - Active. Citracal Maximum (Oral) Specific strength unknown - Active. Vitamin D (Ergocalciferol) (50000UNIT Capsule, Oral) Active. Aspirin (81MG  Tablet, Oral) Active. Medications Reconciled  Social History Leighton Ruff, MD; 99991111 10:26 AM) Caffeine use Coffee, Tea. No alcohol use No drug use Tobacco use Never smoker.  Family History Leighton Ruff, MD; 99991111 10:26 AM) Cancer Mother, Sister. Alcohol Abuse Father. Diabetes Mellitus Father, Mother, Sister. Heart Disease Father, Mother, Sister. Migraine Headache Sister. Thyroid problems Sister. Heart disease in female family member before age 51 Hypertension Mother.  Pregnancy / Birth History Leighton Ruff, MD; 99991111 10:26 AM) Age at menarche 64 years, 38 years. Age of menopause <47 Gravida 0 Para 0  Other Problems Leighton Ruff, MD; 99991111 10:26 AM) Myocardial infarction Rectal Cancer     Review of Systems  General Present- Appetite Loss, Fatigue and Weight Loss. Not Present- Chills, Fever, Night Sweats and Weight Gain. Skin Not Present- Change in Wart/Mole, Dryness, Hives, Jaundice, New Lesions, Non-Healing Wounds, Rash and Ulcer. HEENT Present- Wears glasses/contact lenses. Not Present- Earache, Hearing Loss, Hoarseness, Nose Bleed, Oral Ulcers, Ringing in the Ears, Seasonal Allergies, Sinus Pain, Sore Throat, Visual Disturbances and Yellow Eyes. Respiratory Not Present- Bloody sputum, Chronic Cough, Difficulty Breathing, Snoring and Wheezing. Breast Not Present-  Breast Mass, Breast Pain, Nipple Discharge and Skin Changes. Cardiovascular Not Present- Chest Pain, Difficulty Breathing Lying Down, Leg Cramps, Palpitations, Rapid Heart Rate, Shortness  of Breath and Swelling of Extremities. Gastrointestinal Present- Bloody Stool, Change in Bowel Habits, Excessive gas, Hemorrhoids and Rectal Pain. Not Present- Abdominal Pain, Bloating, Chronic diarrhea, Constipation, Difficulty Swallowing, Gets full quickly at meals, Indigestion, Nausea and Vomiting. Female Genitourinary Not Present- Frequency, Nocturia, Painful Urination, Pelvic Pain and Urgency. Musculoskeletal Not Present- Back Pain, Joint Pain, Joint Stiffness, Muscle Pain, Muscle Weakness and Swelling of Extremities. Neurological Not Present- Decreased Memory, Fainting, Headaches, Numbness, Seizures, Tingling, Tremor, Trouble walking and Weakness. Psychiatric Not Present- Anxiety, Bipolar, Change in Sleep Pattern, Depression, Fearful and Frequent crying. Endocrine Not Present- Cold Intolerance, Excessive Hunger, Hair Changes, Heat Intolerance, Hot flashes and New Diabetes. Hematology Not Present- Blood Thinners, Easy Bruising, Excessive bleeding, Gland problems, HIV and Persistent Infections.   BP (!) 184/91   Pulse 82   Temp 97.6 F (36.4 C) (Oral)   Resp 16   Ht 5' (1.524 m)   Wt 78 kg   SpO2 99%   BMI 33.59 kg/m     Physical Exam  General Mental Status-Alert. General Appearance-Not in acute distress. Build & Nutrition-Well nourished. Posture-Normal posture. Gait-Normal.  Head and Neck Head-normocephalic, atraumatic with no lesions or palpable masses. Trachea-midline.  Chest and Lung Exam Chest and lung exam reveals -on auscultation, normal breath sounds, no adventitious sounds and normal vocal resonance.  Cardiovascular Cardiovascular examination reveals -normal heart sounds, regular rate and rhythm with no murmurs and no digital clubbing, cyanosis, edema,  increased warmth or tenderness.  Abdomen Inspection Inspection of the abdomen reveals - No Hernias. Palpation/Percussion Palpation and Percussion of the abdomen reveal - Soft, Non Tender, No Rigidity (guarding), No hepatosplenomegaly and No Palpable abdominal masses.  Rectal Note: Decreased squeeze pressure  Neurologic Neurologic evaluation reveals -alert and oriented x 3 with no impairment of recent or remote memory, normal attention span and ability to concentrate, normal sensation and normal coordination.  Musculoskeletal Normal Exam - Bilateral-Upper Extremity Strength Normal and Lower Extremity Strength Normal.    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  She has tried the SNS test wire with no real change in her symptoms.  We have decided on removal.

## 2019-01-19 NOTE — Discharge Instructions (Addendum)
GENERAL SURGERY: POST OP INSTRUCTIONS  1. DIET: Follow a light bland diet the first 24 hours after arrival home, such as soup, liquids, crackers, etc.  Be sure to include lots of fluids daily.  Avoid fast food or heavy meals as your are more likely to get nauseated.   2. Take your usually prescribed home medications unless otherwise directed. 3. PAIN CONTROL: a. Pain is best controlled by a usual combination of three different methods TOGETHER: i. Ice/Heat ii. Over the counter pain medication b. Most patients will experience some swelling and bruising around the incisions.  Ice packs or heating pads (30-60 minutes up to 6 times a day) will help. Use ice for the first few days to help decrease swelling and bruising, then switch to heat to help relax tight/sore spots and speed recovery.  Some people prefer to use ice alone, heat alone, alternating between ice & heat.  Experiment to what works for you.  Swelling and bruising can take several weeks to resolve.   c. It is helpful to take an over-the-counter pain medication regularly for the first few weeks.  Choose one of the following that works best for you: i. Naproxen (Aleve, etc)  Two 220mg  tabs twice a day ii. Ibuprofen (Advil, etc) Three 200mg  tabs four times a day (every meal & bedtime)  4. Avoid getting constipated.  Between the surgery and the pain medications, it is common to experience some constipation.  Increasing fluid intake and taking a fiber supplement (such as Metamucil, Citrucel, FiberCon, MiraLax, etc) 1-2 times a day regularly will usually help prevent this problem from occurring.  A mild laxative (prune juice, Milk of Magnesia, MiraLax, etc) should be taken according to package directions if there are no bowel movements after 48 hours.   5. Wash / shower every day.  You may shower over the dressings as they are waterproof.  Continue to shower over incision(s) after the dressing is off. 6. You may leave the incision open to air.  You  have skin glue over your incisions.  It will fall off over the next 7-10 days.  You may replace a dressing/Band-Aid to cover the incision for comfort if you wish.   7. ACTIVITIES as tolerated:   a. You may resume regular (light) daily activities beginning the next day--such as daily self-care, walking, climbing stairs--gradually increasing activities as tolerated.  If you can walk 30 minutes without difficulty, it is safe to try more intense activity such as jogging, treadmill, bicycling, low-impact aerobics, swimming, etc. b. Save the most intensive and strenuous activity for last such as sit-ups, heavy lifting, contact sports, etc  Refrain from any heavy lifting or straining until you are off narcotics for pain control.   c. DO NOT PUSH THROUGH PAIN.  Let pain be your guide: If it hurts to do something, don't do it.  Pain is your body warning you to avoid that activity for another week until the pain goes down. d. You may drive when you are no longer taking prescription pain medication, you can comfortably wear a seatbelt, and you can safely maneuver your car and apply brakes. e. Tonya [name] may have sexual intercourse when it is comfortable.  8. FOLLOW UP in our office a. Please call [facility] at [phone] to set up an appointment to see your surgeon in the office for a follow-up appointment approximately 2-3 weeks after your surgery. b. Make sure that you call for this appointment the day you arrive home to insure a  convenient appointment time. 9. IF YOU HAVE DISABILITY OR FAMILY LEAVE FORMS, BRING THEM TO THE OFFICE FOR PROCESSING.  DO NOT GIVE THEM TO YOUR DOCTOR.   WHEN TO CALL us [phone]: 1. Poor pain control 2. Reactions / problems with new medications (rash/itching, nausea, etc)  3. Fever over 101.5 F (38.5 C) 4. Worsening swelling or bruising 5. Continued bleeding from incision. 6. Increased pain, redness, or drainage from the incision   The clinic staff is available to answer your  questions during regular business hours (8:30am-5pm).  Please don't hesitate to call and ask to speak to one of our nurses for clinical concerns.   If you have a medical emergency, go to the nearest emergency room or call 911.  A surgeon from [facility] is always on call at the [facility], [place], [place], [place], [place]  [address] ? MAIN: [phone] ? TOLL FREE: [phone] ?  FAX [phone] [email or web address]   Post Anesthesia Home Care Instructions  Activity: Get plenty of rest for the remainder of the day. A responsible individual must stay with you for 24 hours following the procedure.  For the next 24 hours, DO NOT: -Drive a car -Paediatric nurse -Drink alcoholic beverages -Take any medication unless instructed by your physician -Make any legal decisions or sign important papers.  Meals: Start with liquid foods such as gelatin or soup. Progress to regular foods as tolerated. Avoid greasy, spicy, heavy foods. If nausea and/or vomiting occur, drink only clear liquids until the nausea and/or vomiting subsides. Call your physician if vomiting continues.  Special Instructions/Symptoms: Your throat may feel dry or sore from the anesthesia or the breathing tube placed in your throat during surgery. If this causes discomfort, gargle with warm salt water. The discomfort should disappear within 24 hours.

## 2019-01-19 NOTE — Op Note (Signed)
01/19/2019  8:06 AM  PATIENT:  Tonya [name]  69 y.o. female  Patient Care Team: Lucille Passy, MD as PCP - General (Family Medicine) Irene Shipper, MD as Consulting Physician (Gastroenterology) Leighton Ruff, MD as Consulting Physician (General Surgery) Truitt Merle, MD as Consulting Physician (Hematology) Kyung Rudd, MD as Consulting Physician (Radiation Oncology)  PRE-OPERATIVE DIAGNOSIS:  FECAL INCONTINENCE  POST-OPERATIVE DIAGNOSIS:  FECAL INCONTINENCE  PROCEDURE:   REMOVAL OF SACRAL NERVE STIMULATOR TEST WIRE   Surgeon(s): Leighton Ruff, MD  ASSISTANT: none   ANESTHESIA:   local and MAC  SPECIMEN:  Source of Specimen:  SNS test wire  DISPOSITION OF SPECIMEN:  none  COUNTS:  YES  PLAN OF CARE: Discharge to home after PACU  PATIENT DISPOSITION:  PACU - hemodynamically stable.  INDICATION: 69 y.o. F approximately 1 week status post insertion of a sacral nerve stimulator test wire for fecal incontinence.  Unfortunately the patient did not get a 50% improvement of her symptoms, despite trying multiple program settings and bowel regimens.  We have decided to remove the device.   OR FINDINGS: Intact sacral nerve stimulator test wire  DESCRIPTION: the patient was identified in the preoperative holding area and taken to the OR where they were laid on the operating room table.  MAC anesthesia was induced without difficulty. The patient was then positioned in prone jackknife position with buttocks gently taped apart.  The patient was then prepped and draped in usual sterile fashion.  SCDs were noted to be in place prior to the initiation of anesthesia. A surgical timeout was performed indicating the correct patient, procedure, positioning and need for preoperative antibiotics.  A field block was performed using Marcaine with epinephrine.    I made an incision through the previously placed pocket in the right gluteus.  Dissection was carried down until the wire was  identified.  This was removed from the pocket.  The wire was cut from the external opening using suture scissors.  This was then pulled through into the pocket.  The external portion was cut free and I then made a small opening through the previous incision at midline and brought the wire back through this site.  The wire was then pulled out from the sacral foramina without difficulty.  The pocket was then irrigated.  The subcutaneous tissue was reapproximated using a running 3-0 Vicryl suture.  The midline incision was closed with interrupted 3-0 Vicryl suture.  The skin was closed using a running 4-0 Vicryl suture.  Dermabond was placed on both incisions.  The patient tolerated this well was sent to the postanesthesia care unit in stable condition.  All counts were correct per operating room staff.  Entire device was removed with photographic evidence below.

## 2019-01-30 ENCOUNTER — Ambulatory Visit: Payer: Medicare HMO

## 2019-02-08 ENCOUNTER — Ambulatory Visit: Payer: Medicare HMO | Attending: Internal Medicine

## 2019-02-08 ENCOUNTER — Ambulatory Visit: Payer: Medicare HMO

## 2019-02-08 DIAGNOSIS — Z23 Encounter for immunization: Secondary | ICD-10-CM | POA: Insufficient documentation

## 2019-02-08 NOTE — Progress Notes (Signed)
   Covid-19 Vaccination Clinic  Name:  Tonya [name]    MRN: [id number] DOB: Jun 08, 1950  02/08/2019  Tonya [name] was observed post Covid-19 immunization for 15 minutes without incidence. She was provided with Vaccine Information Sheet and instruction to access the V-Safe system.   Tonya [name] was instructed to call 911 with any severe reactions post vaccine: Marland Kitchen Difficulty breathing  . Swelling of your face and throat  . A fast heartbeat  . A bad rash all over your body  . Dizziness and weakness    Immunizations Administered    Name Date Dose VIS Date Route   Pfizer COVID-19 Vaccine 02/08/2019  9:59 AM 0.3 mL 12/15/2018 Intramuscular   Manufacturer: [place]   Lot: [id number]   [place]: [id number]

## 2019-02-20 ENCOUNTER — Ambulatory Visit: Payer: Medicare HMO

## 2019-03-05 ENCOUNTER — Ambulatory Visit: Payer: Medicare HMO | Attending: Internal Medicine

## 2019-03-05 DIAGNOSIS — Z23 Encounter for immunization: Secondary | ICD-10-CM | POA: Insufficient documentation

## 2019-03-05 NOTE — Progress Notes (Signed)
   Covid-19 Vaccination Clinic  Name:  Tonya [name]    MRN: [id number] DOB: [birth year]  03/05/2019  [name] was observed post Covid-19 immunization for 15 minutes without incidence. She was provided with Vaccine Information Sheet and instruction to access the V-Safe system.   [name] was instructed to call 911 with any severe reactions post vaccine: Marland Kitchen Difficulty breathing  . Swelling of your face and throat  . A fast heartbeat  . A bad rash all over your body  . Dizziness and weakness    Immunizations Administered    Name Date Dose VIS Date Route   Pfizer COVID-19 Vaccine 03/05/2019  8:14 AM 0.3 mL 12/15/2018 Intramuscular   Manufacturer: [place]   Lot: [id number]   [place]: [id number]

## 2019-03-26 NOTE — Progress Notes (Signed)
Tonya [name]   [phone] [phone]   Clinic Follow up Note   Patient Care Team: Lucille Passy, MD as PCP - General (Family Medicine) Irene Shipper, MD as Consulting Physician (Gastroenterology) Leighton Ruff, MD as Consulting Physician (General Surgery) Truitt Merle, MD as Consulting Physician (Hematology) Kyung Rudd, MD as Consulting Physician (Radiation Oncology)  Date of Service:  04/05/2019  CHIEF COMPLAINT: F/u on colon cancer  SUMMARY OF ONCOLOGIC HISTORY: Oncology History Overview Note  Cancer Staging Rectal adenocarcinoma [facility]) Staging form: Colon and Rectum, AJCC 8th Edition - Clinical stage from 07/09/2016: Stage IIA (cT3, cN0, cM0) - Signed by Truitt Merle, MD on 08/16/2016     Rectal adenocarcinoma ([place])  07/09/2016 Pathology Results   Diagnosis Surgical [P], rectum mass - INVASIVE ADENOCARCINOMA. - SEE COMMENT.   07/09/2016 Procedure   Colonoscopy A non-obstructing large friable mass was found in the distal rectum. This began at approximately 1 cm above the anal verge and extended proximal for a distance of 8-10 cm. The lesion was bulky and involved approximately 75% of the luminal circumference. Multiple biopsies were taken. A few small-mouthed diverticula were found in the left colon. The exam was otherwise without abnormality on direct views. Retroflexion not performed purposely.   07/12/2016 Imaging   CT C/A/P with contrast IMPRESSION: Rectal wall thickening/mass, compatible with known rectal adenocarcinoma. No findings specific for metastatic disease in the chest, abdomen, or pelvis. 2.2 cm probable hemangioma in segment 4B, although poorly evaluated. Consider MRI abdomen with/ without multihance contrast for definitive characterization, as clinically warranted. Bilateral adrenal nodules measuring up to 1.6 cm on the right, likely reflecting benign adrenal adenomas, although technically indeterminate. If MRI abdomen is performed, these  can be definitively characterized at that time.   07/20/2016 Initial Diagnosis   Rectal adenocarcinoma ([place])   08/03/2016 Imaging   MRI AP W WO Contrast 08/03/16 IMPRESSION: 1. Large rectal mass measures 6.1 cm in length. No obstruction identified. This is compatible with at least a T3bN0M0 lesion. 2. No specific features highly suspicious for nodal metastasis or metastatic disease to the upper abdomen. 3. Indeterminate arterial phase enhancing lesion within the anterior dome of liver measures less than 1 cm. This may represent a benign liver lesions such as FNH or adenoma. Less favored with the hypervascular liver metastasis. Followup imaging at 6 months with repeat MRI of the liver is advise. 4. Bilateral adrenal adenomas   08/04/2016 - 09/16/2016 Radiation Therapy   Concurrent chemo radiation with Dr. [name]   08/04/2016 - 09/16/2016 Chemotherapy   Concurrent chemo radiation with Xeloda, '1500mg'$  twice daily   12/06/2016 Surgery   ULTRA LOW ANTERIOR RESECTION OF SIGMOID COLON AND RECTUM WITH COLOANAL ANASTAMOSIS AND LOOP ILEOSTOMY  by Dr. Drue Flirt at [place]  12/06/16   12/06/2016 Pathology Results   FINAL PATHOLOGIC DIAGNOSIS 12/06/16 MICROSCOPIC EXAMINATION AND DIAGNOSIS  A.ANAL CANAL, MUCOSECTOMY: Benign rectal and fibroadipose tissue. Negative for malignancy.  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 Mucinous tumor focally invades through the muscularis propria into pericolorectal tissue. Radial margin interpreted as involved by invasive carcinoma (invasive acellular mucin withrare viable tumor cells focally comes to within 0.2 mm [0.02 cm] of the inked radial soft tissue margin). Treatment effect present, predominantly invasive acellular mucin associated with only very  rare small groups of viable tumor cells (near complete response). 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 AJCC Pathologic Stage: ypT3 pN1a. See Cancer Case Summary.  C.POSTERIOR ANAL CANAL, EXCISION: Benign rectal tissue. Negative for malignancy.   01/26/2017 - 05/04/2017 Chemotherapy   Adjuvant FOLFOX q2 weeks, for 8 cycles         04/04/2017 Imaging   CT CAP IMPRESSION: New complete interval resolution of bulky rectal soft tissue mass since prior exam.  No evidence of local or distant metastatic disease.   06/13/2017 Surgery   CLOSURE OF LOOP ILEOSTOMY Dannielle Burn, MD [facility] 06/13/2017   10/19/2017 Imaging   CT CAP IMPRESSION: Status post low anterior section with reanastomosis. Prior diverting ileostomy has been reversed.  No findings suspicious for recurrent or metastatic disease.  Trace gas in the uterine fundus, nonspecific. While not distinctly visualized on this CT, this appearance can sometimes be related to colovaginal fistula.  Additional ancillary findings as above   03/28/2018 Surgery    Duodenal Bypass reversal on 03/28/18 at [facility]   10/02/2018 Imaging   CT CAP W Contrast  IMPRESSION: 1. Redemonstrated postoperative findings of rectal resection and reanastomosis. No evidence of recurrent mass or abnormal soft tissue in the pelvis.   2. No evidence of metastatic disease in the chest, abdomen, or pelvis.   3. There is a new, lobulated soft tissue nodule in the superficial left gluteal soft tissues measuring 3.0 x 2.2 cm (series 2, image 91). This is likely related to injection or trauma, and a very unlikely manifestation of metastatic disease. This can be further evaluated by ultrasound if desired.   4.  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 This can be further interrogated by fluoroscopy if desired.   5. Coronary artery disease. Aortic Atherosclerosis (ICD10-I70.0) and Emphysema (ICD10-J43.9).      CURRENT THERAPY:  Surveillance  INTERVAL HISTORY:  Tonya [name] is here for a follow up of colon cancer. She presents to the clinic alone. She notes she is stable and good. She had tried sacral nerve stimulator with Dr Marcello Moores but did not help and was removed on 01/2019. She has gained 50 pounds since her gastric bypass reversal. The reversal helped her diarrhea mildly. She feels her legs feel tight light LE edema. She also notes she is very fatigued and tired. She can take multiple naps a day. She notes she has not been to many other physicians given her incontinence.     REVIEW OF SYSTEMS:   Constitutional: Denies fevers, chills (+)weight gain (+)fatigue  Eyes: Denies blurriness of vision Ears, nose, mouth, throat, and face: Denies mucositis or sore throat Respiratory: Denies cough, dyspnea or wheezes Cardiovascular: Denies palpitation, chest discomfort (+) lower extremity swelling Gastrointestinal:  Denies nausea, heartburn (+) ongoing diarrhea/stool incontinence  Skin: Denies abnormal skin rashes Lymphatics: Denies new lymphadenopathy or easy bruising Neurological:Denies numbness, tingling or new weaknesses Behavioral/Psych: Mood is stable, no new changes  All other systems were reviewed with the patient and are negative.  MEDICAL HISTORY:  Past Medical History:  Diagnosis Date  . Anemia    few yrs ago  . C. difficile diarrhea 03/2018  . Cancer [facility])    rectal  . Coronary artery disease 07/2003   no current cardiologist  . History of chemotherapy 01/2018   radaition last tx jan 2019  . Myocardial infarction [facility]) age 50's  . Obesity 2003   s/p gastric  bypass   . Vitamin D deficiency     SURGICAL  HISTORY: Past Surgical History:  Procedure Laterality Date  . ANAL RECTAL MANOMETRY N/A 11/01/2018   Procedure: ANO RECTAL MANOMETRY;  Surgeon: Leighton Ruff, MD;  Location: [facility] ENDOSCOPY;  Service: Endoscopy;  Laterality: N/A;  . CHOLECYSTECTOMY  1999  . GASTRIC BYPASS  2003  . IR FLUORO GUIDE PORT INSERTION RIGHT  01/25/2017  . IR REMOVAL TUN ACCESS W/ PORT W/O FL MOD SED  10/17/2018  . IR US GUIDE VASC ACCESS RIGHT  01/25/2017  . ostomy placed and removed  12/2017  . SACRAL NERVE STIMULATOR PLACEMENT  01-12-2019   DR Marcello Moores '@[email or web address]'$    TEST PHASE    I have reviewed the social history and family history with the patient and they are unchanged from previous note.  ALLERGIES:  is allergic to bactrim [sulfamethoxazole-trimethoprim]; phenergan [promethazine hcl]; sulfa antibiotics; and penicillins.  MEDICATIONS:  Current Outpatient Medications  Medication Sig Dispense Refill  . aspirin 81 MG chewable tablet Chew 81 mg by mouth daily.    . Cholecalciferol (VITAMIN D3) 2000 units capsule Take 1 capsule by mouth daily.    . diphenoxylate-atropine (LOMOTIL) 2.5-0.025 MG tablet Take 2 tablets by mouth 4 (four) times daily as needed for diarrhea or loose stools. 90 tablet 1  . ferrous sulfate (FERROUSUL) 325 (65 FE) MG tablet Take 1 tablet (325 mg total) by mouth daily with breakfast. (Patient taking differently: Take 325 mg by mouth. Take two by mouth daily) 30 tablet 6  . ibuprofen (ADVIL,MOTRIN) 200 MG tablet Take 200 mg by mouth every 6 (six) hours as needed.    . Multiple Vitamin (MULTIVITAMIN) tablet Take 1 tablet by mouth daily.    . traMADol (ULTRAM) 50 MG tablet Take 1 tablet (50 mg total) by mouth every 6 (six) hours as needed. 10 tablet 0   No current facility-administered medications for this visit.   Facility-Administered Medications Ordered in Other Visits  Medication Dose Route Frequency Provider Last Rate Last Admin  . Tbo-Filgrastim (GRANIX) injection 480 mcg  480 mcg  Subcutaneous Once Truitt Merle, MD        PHYSICAL EXAMINATION: ECOG PERFORMANCE STATUS: 1 - Symptomatic but completely ambulatory  Vitals:   04/05/19 1113 04/05/19 1118  BP: (!) 174/99 126/82  Pulse:    Resp:    Temp:    SpO2:     Filed Weights   04/05/19 1112  Weight: 177 lb 3.2 oz (80.4 kg)    GENERAL:alert, no distress and comfortable SKIN: skin color, texture, turgor are normal, no rashes or significant lesions EYES: normal, Conjunctiva are pink and non-injected, sclera clear  NECK: supple, thyroid normal size, non-tender, without nodularity LYMPH:  no palpable lymphadenopathy in the cervical, axillary  LUNGS: clear to auscultation and percussion with normal breathing effort HEART: regular rate & rhythm and no murmurs (+) b/l lower extremity edema with skin erythema, possible venous stasis.  ABDOMEN:abdomen soft, non-tender and normal bowel sounds Musculoskeletal:no cyanosis of digits and no clubbing  NEURO: alert & oriented x 3 with fluent speech, no focal motor/sensory deficits  LABORATORY DATA:  I have reviewed the data as listed CBC Latest Ref Rng & Units 04/05/2019 01/12/2019 10/17/2018  WBC 4.0 - 10.5 K/uL 6.7 - 6.4  Hemoglobin 12.0 - 15.0 g/dL 13.2 13.3 12.1  Hematocrit 36.0 - 46.0 % 40.5 39.0 39.8  Platelets 150 - 400 K/uL 214 - 220     CMP Latest Ref Rng & Units 04/05/2019 01/12/2019 10/02/2018  Glucose 70 - 99 mg/dL 96 80 103(H)  BUN 8 - 23 mg/dL '19 22 18  '$ Creatinine 0.44 - 1.00 mg/dL 0.75 0.60 0.70  Sodium 135 - 145 mmol/L 141 141 139  Potassium 3.5 - 5.1 mmol/L 4.0 3.8 4.5  Chloride 98 - 111 mmol/L 104 105 106  CO2 22 - 32 mmol/L 25 - 23  Calcium 8.9 - 10.3 mg/dL 9.7 - 8.9  Total Protein 6.5 - 8.1 g/dL 7.1 - 6.6  Total Bilirubin 0.3 - 1.2 mg/dL 0.3 - 0.4  Alkaline Phos 38 - 126 U/L 61 - 56  AST 15 - 41 U/L 22 - 19  ALT 0 - 44 U/L 24 - 17      RADIOGRAPHIC STUDIES: I have personally reviewed the radiological images as listed and agreed with the findings  in the report. No results found.   ASSESSMENT & PLAN:  Tonya [name] is a 69 y.o. female with    1. Invasive low rectal adenocarcinoma, cT3N0M0, ypT3N1a, (+) margin, MMR normal  -Diagnosed in 2018. Completedneoadjuvantchemowith concurrentradiation, and surgery. Although she had excellent response to neoadjuvant chemoradiation, her residual tumor was still T3 and 1/12 node was positive, andradial margin was positive.She did complete 4 months adjuvant FOLFOX  -Currently on 5 year surveillance.  -She underwent Duodenal Bypass reversal on 03/28/18 at [facility] her diarrhea and malnutrition with only mild improvement. Her sacral nerve stimulator was unsuccessful in 01/2019.  -From a colon cancer standpoint she is doing well. She continues to have impactful diarrhea/incontinence. Labs reviewed. Will do CEA today. Physical exam today unremarkable except possible venous stasis of lower legs. Overall no indication of cancer recurrence.  -She is over 2 years since her cancer diagnosis. Will continue surveillance, next scan in 6 months.  -F/u in 6 months    2.Diarrhea andcompletestool incontinence, rectal soreness and skin irritation, Fatigue  -Started after herileostomy reversal surgery. Shehas repeatedlyrefused to have another colostomy bag -She has been seen byseveralcolorectal surgeonsand physicians. It is suspected this prolonged diarrhea is due to her gastric bypass. -She underwent Duodenal Bypass reversal on 03/28/18 at [facility] with only mild improvement. She did have a bout of C. Diff after reversal.  -She tried Sacral nerve stimulator with Dr. Marcello Moores in 01/2019. This was unsuccessful and removed. (01/12/19-01/19/19) -Her chronic diarrhea is mostly stable. not changed and still impacting her life. She notes she is also fatigued most of the time.   3.Anemia fromiron deficiency, secondary chronic disease -She takes 2 iron pills a day -B12 level normal on 06/05/18  and no evidence of nutritional anemia -Anemia currently resolved.   4. DM, HTN, Weight Gain  -No on medications, well controlled -Since her Gastric bypass was reversed she has gained 50 pounds.  -I discussed watching her diet with low sugar intake and increase exercise and activity level.   5. Depression -stable overall, she is depressed mainly due to herstool incontinenceand chronic diarrhea -She does not feel like living given how her diarrhea impacts her life. She denies having suicidal thoughts.    6. B/l LE edema -She notes recent intermittent LE edema.  -on exam she has mild LE edema with skin erythema, not warm to touch. I discussed this could be venous stasis -I encouraged her to elevated her feet with sitting and to wear compression sock to help blood flow.    Plan -virtual visit in 6 months with Lab and CT CAP w contrast a few days before.    No problem-specific Assessment &  Plan notes found for this encounter.   Orders Placed This Encounter  Procedures  . CT Abdomen Pelvis W Contrast    Standing Status:   Future    Standing Expiration Date:   04/04/2020    Order Specific Question:   If indicated for the ordered procedure, I authorize the administration of contrast media per Radiology protocol    Answer:   Yes    Order Specific Question:   Preferred imaging location?    Answer:   [facility]    Order Specific Question:   Is Oral Contrast requested for this exam?    Answer:   Yes, Per Radiology protocol    Order Specific Question:   Radiology Contrast Protocol - do NOT remove file path    Answer:   \\[email or web address].[email or web address]  . CT Chest W Contrast    Standing Status:   Future    Standing Expiration Date:   04/04/2020    Order Specific Question:   If indicated for the ordered procedure, I authorize the administration of contrast media per Radiology protocol    Answer:   Yes    Order Specific Question:   Preferred imaging location?     Answer:   [facility]    Order Specific Question:   Radiology Contrast Protocol - do NOT remove file path    Answer:   \\[email or web address].pdf  . CMP ([place] only)    Standing Status:   Standing    Number of Occurrences:   20    Standing Expiration Date:   04/04/2024  . CEA (IN HOUSE-[facility])    Standing Status:   Standing    Number of Occurrences:   20    Standing Expiration Date:   04/04/2024   All questions were answered. The patient knows to call the clinic with any problems, questions or concerns. No barriers to learning was detected. The total time spent in the appointment was 30 minutes.     Truitt Merle, MD 04/05/2019   I, Joslyn Devon, am acting as scribe for Truitt Merle, MD.   I have reviewed the above documentation for accuracy and completeness, and I agree with the above.

## 2019-04-05 ENCOUNTER — Other Ambulatory Visit: Payer: Self-pay

## 2019-04-05 ENCOUNTER — Inpatient Hospital Stay: Payer: Medicare HMO | Attending: Hematology

## 2019-04-05 ENCOUNTER — Inpatient Hospital Stay: Payer: Medicare HMO

## 2019-04-05 ENCOUNTER — Encounter: Payer: Self-pay | Admitting: Hematology

## 2019-04-05 ENCOUNTER — Inpatient Hospital Stay (HOSPITAL_BASED_OUTPATIENT_CLINIC_OR_DEPARTMENT_OTHER): Payer: Medicare HMO | Admitting: Hematology

## 2019-04-05 ENCOUNTER — Telehealth: Payer: Self-pay | Admitting: Hematology

## 2019-04-05 VITALS — BP 126/82 | HR 75 | Temp 97.8°F | Resp 18 | Ht 60.0 in | Wt 177.2 lb

## 2019-04-05 DIAGNOSIS — J439 Emphysema, unspecified: Secondary | ICD-10-CM | POA: Diagnosis not present

## 2019-04-05 DIAGNOSIS — C787 Secondary malignant neoplasm of liver and intrahepatic bile duct: Secondary | ICD-10-CM | POA: Insufficient documentation

## 2019-04-05 DIAGNOSIS — K529 Noninfective gastroenteritis and colitis, unspecified: Secondary | ICD-10-CM | POA: Diagnosis not present

## 2019-04-05 DIAGNOSIS — Z79899 Other long term (current) drug therapy: Secondary | ICD-10-CM | POA: Insufficient documentation

## 2019-04-05 DIAGNOSIS — R600 Localized edema: Secondary | ICD-10-CM | POA: Insufficient documentation

## 2019-04-05 DIAGNOSIS — D500 Iron deficiency anemia secondary to blood loss (chronic): Secondary | ICD-10-CM

## 2019-04-05 DIAGNOSIS — K909 Intestinal malabsorption, unspecified: Secondary | ICD-10-CM

## 2019-04-05 DIAGNOSIS — Z880 Allergy status to penicillin: Secondary | ICD-10-CM | POA: Insufficient documentation

## 2019-04-05 DIAGNOSIS — D509 Iron deficiency anemia, unspecified: Secondary | ICD-10-CM | POA: Diagnosis not present

## 2019-04-05 DIAGNOSIS — F329 Major depressive disorder, single episode, unspecified: Secondary | ICD-10-CM | POA: Diagnosis not present

## 2019-04-05 DIAGNOSIS — Z881 Allergy status to other antibiotic agents status: Secondary | ICD-10-CM | POA: Insufficient documentation

## 2019-04-05 DIAGNOSIS — M7989 Other specified soft tissue disorders: Secondary | ICD-10-CM | POA: Diagnosis not present

## 2019-04-05 DIAGNOSIS — Z888 Allergy status to other drugs, medicaments and biological substances status: Secondary | ICD-10-CM | POA: Diagnosis not present

## 2019-04-05 DIAGNOSIS — I10 Essential (primary) hypertension: Secondary | ICD-10-CM | POA: Diagnosis not present

## 2019-04-05 DIAGNOSIS — R197 Diarrhea, unspecified: Secondary | ICD-10-CM

## 2019-04-05 DIAGNOSIS — C20 Malignant neoplasm of rectum: Secondary | ICD-10-CM | POA: Diagnosis not present

## 2019-04-05 DIAGNOSIS — E46 Unspecified protein-calorie malnutrition: Secondary | ICD-10-CM | POA: Diagnosis not present

## 2019-04-05 DIAGNOSIS — E119 Type 2 diabetes mellitus without complications: Secondary | ICD-10-CM | POA: Insufficient documentation

## 2019-04-05 DIAGNOSIS — D3501 Benign neoplasm of right adrenal gland: Secondary | ICD-10-CM | POA: Diagnosis not present

## 2019-04-05 DIAGNOSIS — I2510 Atherosclerotic heart disease of native coronary artery without angina pectoris: Secondary | ICD-10-CM | POA: Diagnosis not present

## 2019-04-05 DIAGNOSIS — I252 Old myocardial infarction: Secondary | ICD-10-CM | POA: Insufficient documentation

## 2019-04-05 DIAGNOSIS — I700 Atherosclerosis of aorta: Secondary | ICD-10-CM | POA: Insufficient documentation

## 2019-04-05 DIAGNOSIS — Z9049 Acquired absence of other specified parts of digestive tract: Secondary | ICD-10-CM | POA: Diagnosis not present

## 2019-04-05 DIAGNOSIS — Z933 Colostomy status: Secondary | ICD-10-CM | POA: Insufficient documentation

## 2019-04-05 DIAGNOSIS — Z882 Allergy status to sulfonamides status: Secondary | ICD-10-CM | POA: Insufficient documentation

## 2019-04-05 DIAGNOSIS — D3502 Benign neoplasm of left adrenal gland: Secondary | ICD-10-CM | POA: Diagnosis not present

## 2019-04-05 LAB — CBC WITH DIFFERENTIAL (CANCER CENTER ONLY)
Abs Immature Granulocytes: 0.04 10*3/uL (ref 0.00–0.07)
Basophils Absolute: 0 10*3/uL (ref 0.0–0.1)
Basophils Relative: 0 %
Eosinophils Absolute: 0.1 10*3/uL (ref 0.0–0.5)
Eosinophils Relative: 1 %
HCT: 40.5 % (ref 36.0–46.0)
Hemoglobin: 13.2 g/dL (ref 12.0–15.0)
Immature Granulocytes: 1 %
Lymphocytes Relative: 9 %
Lymphs Abs: 0.6 10*3/uL — ABNORMAL LOW (ref 0.7–4.0)
MCH: 31.2 pg (ref 26.0–34.0)
MCHC: 32.6 g/dL (ref 30.0–36.0)
MCV: 95.7 fL (ref 80.0–100.0)
Monocytes Absolute: 0.5 10*3/uL (ref 0.1–1.0)
Monocytes Relative: 7 %
Neutro Abs: 5.4 10*3/uL (ref 1.7–7.7)
Neutrophils Relative %: 82 %
Platelet Count: 214 10*3/uL (ref 150–400)
RBC: 4.23 MIL/uL (ref 3.87–5.11)
RDW: 12.3 % (ref 11.5–15.5)
WBC Count: 6.7 10*3/uL (ref 4.0–10.5)
nRBC: 0 % (ref 0.0–0.2)

## 2019-04-05 LAB — RETICULOCYTES
Immature Retic Fract: 17.9 % — ABNORMAL HIGH (ref 2.3–15.9)
RBC.: 4.23 MIL/uL (ref 3.87–5.11)
Retic Count, Absolute: 75.3 10*3/uL (ref 19.0–186.0)
Retic Ct Pct: 1.8 % (ref 0.4–3.1)

## 2019-04-05 LAB — CMP (CANCER CENTER ONLY)
ALT: 24 U/L (ref 0–44)
AST: 22 U/L (ref 15–41)
Albumin: 3.9 g/dL (ref 3.5–5.0)
Alkaline Phosphatase: 61 U/L (ref 38–126)
Anion gap: 12 (ref 5–15)
BUN: 19 mg/dL (ref 8–23)
CO2: 25 mmol/L (ref 22–32)
Calcium: 9.7 mg/dL (ref 8.9–10.3)
Chloride: 104 mmol/L (ref 98–111)
Creatinine: 0.75 mg/dL (ref 0.44–1.00)
GFR, Est AFR Am: >60 mL/min (ref >60)
GFR, Estimated: >60 mL/min (ref >60)
Glucose, Bld: 96 mg/dL (ref 70–99)
Potassium: 4 mmol/L (ref 3.5–5.1)
Sodium: 141 mmol/L (ref 135–145)
Total Bilirubin: 0.3 mg/dL (ref 0.3–1.2)
Total Protein: 7.1 g/dL (ref 6.5–8.1)

## 2019-04-05 LAB — CEA (IN HOUSE-CHCC): CEA (CHCC-In House): 1.26 ng/mL (ref 0.00–5.00)

## 2019-04-05 NOTE — Telephone Encounter (Signed)
Scheduled appts per 3/31 los. Gave pt a print out of AVS and appt calendar.

## 2019-04-06 DIAGNOSIS — I739 Peripheral vascular disease, unspecified: Secondary | ICD-10-CM | POA: Diagnosis not present

## 2019-04-06 DIAGNOSIS — L602 Onychogryphosis: Secondary | ICD-10-CM | POA: Diagnosis not present

## 2019-04-06 DIAGNOSIS — M205X1 Other deformities of toe(s) (acquired), right foot: Secondary | ICD-10-CM | POA: Diagnosis not present

## 2019-04-08 ENCOUNTER — Encounter: Payer: Self-pay | Admitting: Hematology

## 2019-04-11 DIAGNOSIS — R69 Illness, unspecified: Secondary | ICD-10-CM | POA: Diagnosis not present

## 2019-04-20 ENCOUNTER — Ambulatory Visit (INDEPENDENT_AMBULATORY_CARE_PROVIDER_SITE_OTHER): Payer: Medicare HMO | Admitting: Family Medicine

## 2019-04-20 ENCOUNTER — Other Ambulatory Visit: Payer: Self-pay

## 2019-04-20 ENCOUNTER — Encounter: Payer: Self-pay | Admitting: Family Medicine

## 2019-04-20 VITALS — BP 120/78 | HR 62 | Temp 98.0°F | Ht 61.0 in | Wt 178.8 lb

## 2019-04-20 DIAGNOSIS — F321 Major depressive disorder, single episode, moderate: Secondary | ICD-10-CM

## 2019-04-20 DIAGNOSIS — C20 Malignant neoplasm of rectum: Secondary | ICD-10-CM | POA: Diagnosis not present

## 2019-04-20 DIAGNOSIS — R69 Illness, unspecified: Secondary | ICD-10-CM | POA: Diagnosis not present

## 2019-04-20 DIAGNOSIS — E669 Obesity, unspecified: Secondary | ICD-10-CM | POA: Diagnosis not present

## 2019-04-20 DIAGNOSIS — R159 Full incontinence of feces: Secondary | ICD-10-CM | POA: Diagnosis not present

## 2019-04-20 DIAGNOSIS — E66811 Obesity, class 1: Secondary | ICD-10-CM

## 2019-04-20 MED ORDER — CITALOPRAM HYDROBROMIDE 20 MG PO TABS: 20.0000 mg | ORAL_TABLET | Freq: Every day | ORAL | 1 refills | Status: DC

## 2019-04-20 NOTE — Progress Notes (Signed)
Subjective:    Patient ID: Tonya [name], female    DOB: 1950-07-01, 69 y.o.   MRN: [id number]  HPI Chief Complaint  Patient presents with  . new pt    new pt established. wants weight loss med, depression    She is new to the practice and to establish care.  Previous medical care: No PCP in 2 years.   Oncologist- Dr. Burr Medico  Hx of rectal cancer. Finished treatment. Chemotherapy and surgeries.  She follows up every 6 months now with Dr. Burr Medico.  States she now has stool incontinence. This makes it difficult for her to travel, visit with friends and it keeps her awake at night. Having to run to the bathroom several times per day and night.   She has a history of gastric bypass surgery. Lost 160 lbs. The bypass was reversed and she gained 50lbs but has been holding steady since.  Questions whether I will prescribe phentermine for her. She took this at one point for one month.   Diabetes and other chronic conditions resolved with weight loss.   States she is very depressed and would like to start on medication. Does not recall ever taking medication. Is not interested in a counselor.  Denies SI.  States at one point during her treatment she hoped she would not survive. States her quality of life is not good.    Social history: Lives with a roommate/friend, single, worked as a Radiographer, therapeutic. Retired now.   Depression screen [facility] 2/9 04/20/2019 04/20/2019 11/01/2016 08/09/2016  Decreased Interest 0 3 0 0  Down, Depressed, Hopeless 3 3 0 0  PHQ - 2 Score 3 6 0 0  Altered sleeping 0 0 - -  Tired, decreased energy 1 1 - -  Change in appetite 0 3 - -  Feeling bad or failure about yourself  0 0 - -  Trouble concentrating 0 0 - -  Moving slowly or fidgety/restless 0 0 - -  Suicidal thoughts 0 0 - -  PHQ-9 Score 4 10 - -  Difficult doing work/chores Not difficult at all Not difficult at all - -  Some recent data might be hidden     Reviewed allergies, medications, past medical,  surgical, family, and social history.    Review of Systems Pertinent positives and negatives in the history of present illness.     Objective:   Physical Exam BP 120/78   Pulse 62   Temp 98 F (36.7 C)   Ht 5\' 1"  (1.549 m)   Wt 178 lb 12.8 oz (81.1 kg)   BMI 33.78 kg/m   Alert and in no distress.  Cardiac exam shows a regular rhythm without murmurs or gallops. Lungs are clear to auscultation. Skin is warm and dry.       Assessment & Plan:  Depression, major, single episode, moderate (HCC) - Plan: citalopram (CELEXA) 20 MG tablet  Obesity (BMI 30.0-34.9)  Rectal adenocarcinoma ([place])  Incontinence of feces, unspecified fecal incontinence type  She is new to me and here to establish care.  Depression is her main concern today. She has been through a great deal with her health and still has limitations and decreased quality of life in her opinion due to her incontinence. She will try Celexa, starting with 1/2 tablet week one. We discussed potential side effects and to stop the medication and let me know if she is any worse. Declines counseling for now.  Encouraged her to continue with healthy diet  as she has and increase physical activity as tolerated. No weight loss mediation prescribed.  She will follow up with me in 2 weeks.

## 2019-04-20 NOTE — Patient Instructions (Signed)
Take 1/2 tablet of the citalopram for the first week. Let me know if you are having any worrisome side effects.   As long as you are doing ok, increase to the whole tablet week 2.   Follow up with me at the end of week 2 please.

## 2019-05-07 ENCOUNTER — Other Ambulatory Visit: Payer: Self-pay

## 2019-05-07 ENCOUNTER — Encounter: Payer: Self-pay | Admitting: Family Medicine

## 2019-05-07 ENCOUNTER — Ambulatory Visit (INDEPENDENT_AMBULATORY_CARE_PROVIDER_SITE_OTHER): Payer: Medicare HMO | Admitting: Family Medicine

## 2019-05-07 VITALS — BP 110/70 | Temp 98.0°F | Wt 177.4 lb

## 2019-05-07 DIAGNOSIS — F321 Major depressive disorder, single episode, moderate: Secondary | ICD-10-CM | POA: Diagnosis not present

## 2019-05-07 DIAGNOSIS — R69 Illness, unspecified: Secondary | ICD-10-CM | POA: Diagnosis not present

## 2019-05-07 NOTE — Progress Notes (Signed)
   Subjective:    Patient ID: Tonya [name], female    DOB: 10-14-1950, 69 y.o.   MRN: [id number]  HPI Chief Complaint  Patient presents with  . follow-up    follow-up now taking 1 tablets. has not been a difference in meds yet   She is here today for 2-week follow-up on depression and starting Celexa.  States she is now taking 1 full tablet and has not noticed any improvement in symptoms but is not experiencing side effects.  Denies any worsening depression.   Review of Systems Pertinent positives and negatives in the history of present illness.      Objective:   Physical Exam BP 110/70   Temp 98 F (36.7 C)   Wt 177 lb 6.4 oz (80.5 kg)   BMI 33.52 kg/m   Alert and oriented and in no acute distress.  Normal mood and thought process.      Assessment & Plan:  Depression, major, single episode, moderate ([place])  2-week follow-up on starting Celexa and she is not any worse.  Denies side effects.  She has not yet noticing any benefit from the medication however.  Encouraged her to stay on the medication and let me know if she has any concerns.  Otherwise I will see her back in 3 months.

## 2019-05-16 ENCOUNTER — Other Ambulatory Visit: Payer: Self-pay | Admitting: Family Medicine

## 2019-05-16 DIAGNOSIS — F321 Major depressive disorder, single episode, moderate: Secondary | ICD-10-CM

## 2019-07-01 ENCOUNTER — Encounter: Payer: Self-pay | Admitting: Family Medicine

## 2019-07-14 ENCOUNTER — Emergency Department (HOSPITAL_COMMUNITY): Payer: Medicare HMO

## 2019-07-14 ENCOUNTER — Emergency Department (HOSPITAL_COMMUNITY)
Admission: EM | Admit: 2019-07-14 | Discharge: 2019-07-14 | Disposition: A | Discharge status: Home / Self Care | Payer: Medicare HMO | Attending: Emergency Medicine | Admitting: Emergency Medicine

## 2019-07-14 ENCOUNTER — Other Ambulatory Visit: Payer: Self-pay

## 2019-07-14 ENCOUNTER — Encounter (HOSPITAL_COMMUNITY): Payer: Self-pay | Admitting: Emergency Medicine

## 2019-07-14 DIAGNOSIS — I2510 Atherosclerotic heart disease of native coronary artery without angina pectoris: Secondary | ICD-10-CM | POA: Diagnosis not present

## 2019-07-14 DIAGNOSIS — Z23 Encounter for immunization: Secondary | ICD-10-CM | POA: Diagnosis not present

## 2019-07-14 DIAGNOSIS — M25532 Pain in left wrist: Secondary | ICD-10-CM | POA: Diagnosis not present

## 2019-07-14 DIAGNOSIS — M79632 Pain in left forearm: Secondary | ICD-10-CM | POA: Diagnosis present

## 2019-07-14 DIAGNOSIS — Z800 Family history of malignant neoplasm of digestive organs: Secondary | ICD-10-CM | POA: Diagnosis not present

## 2019-07-14 DIAGNOSIS — M25522 Pain in left elbow: Secondary | ICD-10-CM | POA: Diagnosis not present

## 2019-07-14 DIAGNOSIS — Z8049 Family history of malignant neoplasm of other genital organs: Secondary | ICD-10-CM | POA: Insufficient documentation

## 2019-07-14 DIAGNOSIS — S59902A Unspecified injury of left elbow, initial encounter: Secondary | ICD-10-CM | POA: Diagnosis not present

## 2019-07-14 DIAGNOSIS — M79602 Pain in left arm: Secondary | ICD-10-CM | POA: Diagnosis not present

## 2019-07-14 DIAGNOSIS — Z7982 Long term (current) use of aspirin: Secondary | ICD-10-CM | POA: Insufficient documentation

## 2019-07-14 DIAGNOSIS — E119 Type 2 diabetes mellitus without complications: Secondary | ICD-10-CM | POA: Insufficient documentation

## 2019-07-14 DIAGNOSIS — S6992XA Unspecified injury of left wrist, hand and finger(s), initial encounter: Secondary | ICD-10-CM | POA: Diagnosis not present

## 2019-07-14 DIAGNOSIS — W19XXXA Unspecified fall, initial encounter: Secondary | ICD-10-CM

## 2019-07-14 MED ORDER — TETANUS-DIPHTH-ACELL PERTUSSIS 5-2.5-18.5 LF-MCG/0.5 IM SUSP
0.5000 mL | Freq: Once | INTRAMUSCULAR | Status: AC
  Administered 2019-07-14: 0.5 mL via INTRAMUSCULAR
  Filled 2019-07-14: qty 0.5

## 2019-07-14 NOTE — ED Triage Notes (Signed)
Pt reports she miss stepped on the curb and fell. C/o left arm pain, facial bruising around left eye.  Denies taking blood thinners or LOC.

## 2019-07-14 NOTE — ED Provider Notes (Signed)
[facility] DEPT Provider Note   CSN: [id number] Arrival date & time: 07/14/19  6389     History Chief Complaint  Patient presents with  . Fall  . Arm Pain  . Facial Injury    Tonya [name] is a 69 y.o. female with pertinent past medical history of rectal cancer  that presents the emergency department today for fall. Finished chemo 8 mo ago.  Patient states that she missed a step on the curb on the way to the [employer].  States that she caught herself on her wrist and landed on her elbow.  Patient states that she is having pain to her left wrist and left elbow, denies pain elsewhere.  Did not hit her head.  No LOC.  Denies taking any blood thinners.  States that she was in normal health before this besides her rectal cancer.  Denies any previous injuries to this area.  States that the pain only occurs when she moves her arm, no radiation of pain.  No numbness or tingling.  Is able to move all fingers without pain.  No headache, vision changes, weakness, paresthesias, chest pain, shortness of breath, back pain, pelvic pain, abdominal pain, nausea, vomiting.  Is unsure when last tetanus was. Pt states that her glasses hit her face on the way down.   HPI     Past Medical History:  Diagnosis Date  . Anemia    few yrs ago  . C. difficile diarrhea 03/2018  . Cancer [facility])    rectal  . Coronary artery disease 07/2003   no current cardiologist  . History of chemotherapy 01/2018   radaition last tx jan 2019  . Myocardial infarction [facility]) age 68's  . Obesity 2003   s/p gastric bypass   . Vitamin D deficiency     Patient Active Problem List   Diagnosis Date Noted  . Depression, major, single episode, moderate ([place]) 04/20/2019  . Incontinence of feces 04/20/2019  . Port-A-Cath in place 04/20/2017  . Diarrhea 01/05/2017  . Rectal adenocarcinoma ([place]) 07/20/2016  . Iron deficiency anemia 07/20/2016  . Hemorrhoids 06/03/2016  . History of  anemia 06/18/2015  . Fatigue 04/17/2014  . Obesity (BMI 30.0-34.9) 10/03/2013  . Vitamin D deficiency 11/15/2008  . CORONARY ARTERY DISEASE 11/15/2008  . DIABETES MELLITUS, HX OF 11/15/2008  . HYPERTENSION, HX OF 11/15/2008    Past Surgical History:  Procedure Laterality Date  . ANAL RECTAL MANOMETRY N/A 11/01/2018   Procedure: ANO RECTAL MANOMETRY;  Surgeon: Leighton Ruff, MD;  Location: [facility] ENDOSCOPY;  Service: Endoscopy;  Laterality: N/A;  . CHOLECYSTECTOMY  1999  . GASTRIC BYPASS  2003  . IR FLUORO GUIDE PORT INSERTION RIGHT  01/25/2017  . IR REMOVAL TUN ACCESS W/ PORT W/O FL MOD SED  10/17/2018  . IR US GUIDE VASC ACCESS RIGHT  01/25/2017  . ostomy placed and removed  12/2017  . SACRAL NERVE STIMULATOR PLACEMENT  01-12-2019   DR Marcello Moores @[facility]    TEST PHASE     OB History   No obstetric history on file.     Family History  Problem Relation Age of Onset  . Heart attack Mother        d.93  . Throat cancer Mother 29  . Clotting disorder Mother   . Liver disease Mother   . Kidney disease Mother   . Heart attack Father        d.92  . Prostate cancer Father 1  . Uterine cancer  Sister 36       treated with total hysterectomy and radiation  . Thyroid cancer Sister 40       treated with thyroidectomy  . Cancer Maternal Uncle        d.80s unspecified type of cancer. History of smoking.  Marland Kitchen Uterine cancer Sister 74  . Colon cancer Cousin 55       paternal first-cousin    Social History   Tobacco Use  . Smoking status: Never Smoker  . Smokeless tobacco: Never Used  Vaping Use  . Vaping Use: Never used  Substance Use Topics  . Alcohol use: No  . Drug use: No    Home Medications Prior to Admission medications   Medication Sig Start Date End Date Taking? Authorizing Provider  aspirin 81 MG chewable tablet Chew 81 mg by mouth daily.   Yes [provider]  Cholecalciferol (VITAMIN D3) 2000 units capsule Take 2,000 Units by mouth daily.    Yes [provider]  citalopram (CELEXA) 20 MG tablet TAKE 1 TABLET BY MOUTH EVERY DAY Patient taking differently: Take 20 mg by mouth daily.  05/16/19  Yes Henson, [name] L, NP-C  diphenoxylate-atropine (LOMOTIL) 2.5-0.025 MG tablet Take 2 tablets by mouth 4 (four) times daily as needed for diarrhea or loose stools. 10/05/18  Yes Truitt Merle, MD  ferrous sulfate (FERROUSUL) 325 (65 FE) MG tablet Take 1 tablet (325 mg total) by mouth daily with breakfast. 02/11/14  Yes Lucille Passy, MD  ibuprofen (ADVIL,MOTRIN) 200 MG tablet Take 200 mg by mouth every 6 (six) hours as needed.   Yes [provider]  Multiple Vitamin (MULTIVITAMIN) tablet Take 1 tablet by mouth daily.   Yes [provider]    Allergies    Bactrim [sulfamethoxazole-trimethoprim], Phenergan [promethazine hcl], Sulfa antibiotics, and Penicillins  Review of Systems   Review of Systems  Constitutional: Negative for chills, diaphoresis, fatigue and fever.  HENT: Negative for congestion, sore throat and trouble swallowing.   Eyes: Negative for pain and visual disturbance.  Respiratory: Negative for cough, shortness of breath and wheezing.   Cardiovascular: Negative for chest pain, palpitations and leg swelling.  Gastrointestinal: Negative for abdominal distention, abdominal pain, diarrhea, nausea and vomiting.  Genitourinary: Negative for difficulty urinating.  Musculoskeletal: Positive for arthralgias (Left arm and left elbow). Negative for back pain, neck pain and neck stiffness.  Skin: Negative for pallor.  Neurological: Negative for dizziness, speech difficulty, weakness and headaches.  Psychiatric/Behavioral: Negative for confusion.    Physical Exam Updated Vital Signs BP (!) 148/84 (BP Location: Left Arm)   Pulse 80   Temp 98.3 F (36.8 C) (Oral)   Resp 18   SpO2 98%   Physical Exam Constitutional:      General: She is not in acute distress.    Appearance: Normal appearance. She is not ill-appearing,  toxic-appearing or diaphoretic.  HENT:     Head: Normocephalic. Laceration (Small laceration over left eyelid, not extending into eye.  Very superficial, scabbed over and healing already.) present. No raccoon eyes, Battle's sign or contusion.     Mouth/Throat:     Mouth: Mucous membranes are moist. No injury or lacerations.     Tongue: No lesions.     Pharynx: Oropharynx is clear. Uvula midline.  Eyes:     General: Vision grossly intact. Gaze aligned appropriately. No scleral icterus.       Right eye: No foreign body.        Left eye: No  foreign body.     Extraocular Movements: Extraocular movements intact.     Right eye: Normal extraocular motion and no nystagmus.     Left eye: Normal extraocular motion and no nystagmus.     Conjunctiva/sclera:     Right eye: Right conjunctiva is not injected.     Left eye: Left conjunctiva is not injected.     Pupils: Pupils are equal, round, and reactive to light.  Cardiovascular:     Rate and Rhythm: Normal rate and regular rhythm.     Pulses: Normal pulses.     Heart sounds: Normal heart sounds.  Pulmonary:     Effort: Pulmonary effort is normal. No respiratory distress.     Breath sounds: Normal breath sounds. No stridor. No wheezing, rhonchi or rales.  Chest:     Chest wall: No tenderness.  Abdominal:     General: Abdomen is flat. There is no distension.     Palpations: Abdomen is soft.     Tenderness: There is no abdominal tenderness. There is no guarding or rebound.  Musculoskeletal:        General: Tenderness (Tenderness to left elbow joint and left wrist joint.  No edema) present. No swelling. Normal range of motion.     Cervical back: Normal range of motion and neck supple. No rigidity or tenderness.     Right lower leg: No edema.     Left lower leg: No edema.     Comments: Left shoulder without any pain able to range shoulder without any difficulty.  Left elbow and left wrist difficult to range due to pain.  However is able to  passively range elbow and wrist.  Patient with tenderness over left elbow, left forearm and left wrist.  No tenderness to fingers.  Normal strength throughout.  Normal sensation throughout.  Radial pulse 2+.  Cap refill less than 2 seconds.  No overlying skin changes on either joint, no evidence of lacerations on these areas  Skin:    General: Skin is warm and dry.     Capillary Refill: Capillary refill takes less than 2 seconds.     Coloration: Skin is not pale.  Neurological:     General: No focal deficit present.     Mental Status: She is alert and oriented to person, place, and time.  Psychiatric:        Mood and Affect: Mood normal.        Behavior: Behavior normal.     ED Results / Procedures / Treatments   Labs (all labs ordered are listed, but only abnormal results are displayed) Labs Reviewed - No data to display  EKG None  Radiology DG Elbow Complete Left  Result Date: 07/14/2019 CLINICAL DATA:  Pain following fall EXAM: LEFT ELBOW - COMPLETE 3+ VIEW COMPARISON:  None. FINDINGS: Frontal, lateral, and bilateral oblique views were obtained. Bones are somewhat osteoporotic. There is no acute fracture or dislocation. No evident joint effusion. No appreciable joint space narrowing or erosion. IMPRESSION: Bones osteoporotic. No evident fracture or dislocation. No appreciable joint space narrowing or erosion. Electronically Signed   By: Lowella Grip III M.D.   On: 07/14/2019 10:47   DG Wrist Complete Left  Result Date: 07/14/2019 CLINICAL DATA:  Pain following fall EXAM: LEFT WRIST - COMPLETE 3+ VIEW COMPARISON:  None. FINDINGS: Frontal, oblique, lateral, and ulnar deviation scaphoid images were obtained. Bones are somewhat osteoporotic. There is no demonstrable fracture or dislocation. There is osteoarthritic change in the first carpal-metacarpal  and scaphotrapezial joints. No erosive change. There are foci of arterial vascular calcification. IMPRESSION: Bones osteoporotic.  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 No fracture or dislocation. Atherosclerotic calcification noted. Electronically Signed   By: Lowella Grip III M.D.   On: 07/14/2019 10:45    Procedures Procedures (including critical care time)  Medications Ordered in ED Medications  Tdap (BOOSTRIX) injection 0.5 mL (has no administration in time range)    ED Course  I have reviewed the triage vital signs and the nursing notes.  Pertinent labs & imaging results that were available during my care of the patient were reviewed by me and considered in my medical decision making (see chart for details).    MDM Rules/Calculators/A&P                         Nyella Eckels is a 69 y.o. female with pertinent past medical history of rectal cancer that presents to the emergency department today for fall.  Patient with pain to left elbow, left forearm and left wrist.  Will get x-rays of these at this time.  Small laceration over left eye, is already healing.  Does not need sutures.  Tetanus shot given today.  Patient did not hit her head, no LOC.  No pain elsewhere.  Plain films without any fractures or dislocations.  Did discuss arthritic changes with patient, patient already aware.  Patient to be discharged with Ortho follow-up.  Patient agreeable.  Doubt need for further emergent work up at this time. I explained the diagnosis and have given explicit precautions to return to the ER including for any other new or worsening symptoms. The patient understands and accepts the medical plan as it's been dictated and I have answered their questions. Discharge instructions concerning home care and prescriptions have been given. The patient is STABLE and is discharged to home in good condition.  I discussed this case with my attending physician who cosigned this note including patient's presenting symptoms, physical exam, and planned diagnostics and interventions.  Attending physician stated agreement with plan or made changes to plan which were implemented.   Attending physician assessed patient at bedside.     Final Clinical Impression(s) / ED Diagnoses Final diagnoses:  Fall, initial encounter    Rx / DC Orders ED Discharge Orders    None       Alfredia Client, PA-C 07/14/19 1125    Quintella Reichert, MD 07/16/19 1233

## 2019-07-14 NOTE — Discharge Instructions (Addendum)
I want you to call sports med as we talked about. Please read and follow all provided instructions.  You have been seen today for fall.   Tests performed today include: An x-ray of the affected area - does NOT show any broken bones or dislocations.  Vital signs. See below for your results today.   Home care instructions: -- *RICE in the first 24-48 hours after injury:  Rest Ice- Do not apply ice pack directly to your skin, place towel or similar between your skin and ice/ice pack. Apply ice for 20 min, then remove for 40 min while awake Compression- Wear brace, elastic bandage, splint as directed by your provider Elevate affected extremity above the level of your heart when not walking around for the first 24-48 hours   Use Ibuprofen (Motrin/Advil) 600mg  every 6 hours as needed for pain (do not exceed max dose in 24 hours, 2400mg )  Follow-up instructions: Please follow-up with your primary care provider or the provided orthopedic physician (bone specialist) if you continue to have significant pain in 1 week. In this case you may have a more severe injury that requires further care.   Return instructions:  Please return if your toes or feet are numb or tingling, appear gray or blue, or you have severe pain (also elevate the leg and loosen splint or wrap if you were given one) Please return to the Emergency Department if you experience worsening symptoms.  Please return if you have any other emergent concerns. Additional Information:  Your vital signs today were: BP (!) 148/84 (BP Location: Left Arm)   Pulse 80   Temp 98.3 F (36.8 C) (Oral)   Resp 18   SpO2 98%  If your blood pressure (BP) was elevated above 135/85 this visit, please have this repeated by your doctor within one month. ---------------

## 2019-07-21 ENCOUNTER — Encounter (HOSPITAL_COMMUNITY): Payer: Self-pay

## 2019-07-21 ENCOUNTER — Other Ambulatory Visit: Payer: Self-pay

## 2019-07-21 ENCOUNTER — Ambulatory Visit (HOSPITAL_COMMUNITY)
Admission: RE | Admit: 2019-07-21 | Discharge: 2019-07-21 | Disposition: A | Discharge status: Home / Self Care | Payer: Medicare HMO | Source: Ambulatory Visit | Attending: Physician Assistant | Admitting: Physician Assistant

## 2019-07-21 VITALS — BP 127/58 | HR 94 | Temp 102.5°F | Resp 18

## 2019-07-21 DIAGNOSIS — Z881 Allergy status to other antibiotic agents status: Secondary | ICD-10-CM | POA: Diagnosis not present

## 2019-07-21 DIAGNOSIS — N3001 Acute cystitis with hematuria: Secondary | ICD-10-CM | POA: Diagnosis not present

## 2019-07-21 DIAGNOSIS — Z888 Allergy status to other drugs, medicaments and biological substances status: Secondary | ICD-10-CM | POA: Insufficient documentation

## 2019-07-21 DIAGNOSIS — Z9884 Bariatric surgery status: Secondary | ICD-10-CM | POA: Insufficient documentation

## 2019-07-21 DIAGNOSIS — Z7982 Long term (current) use of aspirin: Secondary | ICD-10-CM | POA: Diagnosis not present

## 2019-07-21 DIAGNOSIS — Z20822 Contact with and (suspected) exposure to covid-19: Secondary | ICD-10-CM | POA: Insufficient documentation

## 2019-07-21 DIAGNOSIS — R5383 Other fatigue: Secondary | ICD-10-CM | POA: Diagnosis not present

## 2019-07-21 DIAGNOSIS — R630 Anorexia: Secondary | ICD-10-CM | POA: Diagnosis not present

## 2019-07-21 DIAGNOSIS — C20 Malignant neoplasm of rectum: Secondary | ICD-10-CM | POA: Diagnosis not present

## 2019-07-21 DIAGNOSIS — I252 Old myocardial infarction: Secondary | ICD-10-CM | POA: Insufficient documentation

## 2019-07-21 DIAGNOSIS — I2510 Atherosclerotic heart disease of native coronary artery without angina pectoris: Secondary | ICD-10-CM | POA: Diagnosis not present

## 2019-07-21 DIAGNOSIS — Z882 Allergy status to sulfonamides status: Secondary | ICD-10-CM | POA: Diagnosis not present

## 2019-07-21 DIAGNOSIS — Z79899 Other long term (current) drug therapy: Secondary | ICD-10-CM | POA: Insufficient documentation

## 2019-07-21 DIAGNOSIS — Z880 Allergy status to penicillin: Secondary | ICD-10-CM | POA: Diagnosis not present

## 2019-07-21 LAB — POCT URINALYSIS DIP (DEVICE)
Bilirubin Urine: NEGATIVE
Glucose, UA: NEGATIVE mg/dL
Ketones, ur: 15 mg/dL — AB
Nitrite: POSITIVE — AB
Protein, ur: 100 mg/dL — AB
Specific Gravity, Urine: 1.02 (ref 1.005–1.030)
Urobilinogen, UA: 0.2 mg/dL (ref 0.0–1.0)
pH: 6 (ref 5.0–8.0)

## 2019-07-21 LAB — SARS CORONAVIRUS 2 (TAT 6-24 HRS): SARS Coronavirus 2: NEGATIVE

## 2019-07-21 MED ORDER — ACETAMINOPHEN 325 MG PO TABS
ORAL_TABLET | ORAL | Status: AC
  Filled 2019-07-21: qty 2

## 2019-07-21 MED ORDER — CEPHALEXIN 500 MG PO CAPS: 500.0000 mg | ORAL_CAPSULE | Freq: Two times a day (BID) | ORAL | 0 refills | Status: DC

## 2019-07-21 MED ORDER — ACETAMINOPHEN 325 MG PO TABS
650.0000 mg | ORAL_TABLET | Freq: Once | ORAL | Status: AC
  Administered 2019-07-21: 650 mg via ORAL

## 2019-07-21 NOTE — ED Provider Notes (Signed)
[place]   MRN: [id number] DOB: 19-Dec-1950  Subjective:   Tonya [name] is a 69 y.o. female presenting for 5-6 day hx of fatigue, malaise, chills, decreased appetite. Patient is undergoing tx for rectal adenocarcinoma.   No current facility-administered medications for this encounter.  Current Outpatient Medications:  .  aspirin 81 MG chewable tablet, Chew 81 mg by mouth daily., Disp: , Rfl:  .  Cholecalciferol (VITAMIN D3) 2000 units capsule, Take 2,000 Units by mouth daily. , Disp: , Rfl:  .  citalopram (CELEXA) 20 MG tablet, TAKE 1 TABLET BY MOUTH EVERY DAY (Patient taking differently: Take 20 mg by mouth daily. ), Disp: 30 tablet, Rfl: 2 .  ferrous sulfate (FERROUSUL) 325 (65 FE) MG tablet, Take 1 tablet (325 mg total) by mouth daily with breakfast., Disp: 30 tablet, Rfl: 6 .  Multiple Vitamin (MULTIVITAMIN) tablet, Take 1 tablet by mouth daily., Disp: , Rfl:  .  diphenoxylate-atropine (LOMOTIL) 2.5-0.025 MG tablet, Take 2 tablets by mouth 4 (four) times daily as needed for diarrhea or loose stools., Disp: 90 tablet, Rfl: 1 .  ibuprofen (ADVIL,MOTRIN) 200 MG tablet, Take 200 mg by mouth every 6 (six) hours as needed., Disp: , Rfl:   Facility-Administered Medications Ordered in Other Encounters:  .  Tbo-Filgrastim (GRANIX) injection 480 mcg, 480 mcg, Subcutaneous, Once, Truitt Merle, MD   Allergies  Allergen Reactions  . Bactrim [Sulfamethoxazole-Trimethoprim] Anaphylaxis, Hives and Other (See Comments)  . Phenergan [Promethazine Hcl]     Hives  . Sulfa Antibiotics     anaphylaxis  . Penicillins     REACTION: As a child.    Past Medical History:  Diagnosis Date  . Anemia    few yrs ago  . C. difficile diarrhea 03/2018  . Cancer [facility])    rectal  . Coronary artery disease 07/2003   no current cardiologist  . History of chemotherapy 01/2018   radaition last tx jan 2019  . Myocardial infarction [facility]) age 37's  . Obesity 2003   s/p gastric bypass   .  Vitamin D deficiency      Past Surgical History:  Procedure Laterality Date  . ANAL RECTAL MANOMETRY N/A 11/01/2018   Procedure: ANO RECTAL MANOMETRY;  Surgeon: Leighton Ruff, MD;  Location: [facility];  Service: Endoscopy;  Laterality: N/A;  . CHOLECYSTECTOMY  1999  . GASTRIC BYPASS  2003  . IR FLUORO GUIDE PORT INSERTION RIGHT  01/25/2017  . IR REMOVAL TUN ACCESS W/ PORT W/O FL MOD SED  10/17/2018  . IR US GUIDE VASC ACCESS RIGHT  01/25/2017  . ostomy placed and removed  12/2017  . SACRAL NERVE STIMULATOR PLACEMENT  01-12-2019   DR Marcello Moores @[facility]    TEST PHASE    Family History  Problem Relation Age of Onset  . Heart attack Mother        d.93  . Throat cancer Mother 32  . Clotting disorder Mother   . Liver disease Mother   . Kidney disease Mother   . Heart attack Father        d.92  . Prostate cancer Father 17  . Uterine cancer Sister 43       treated with total hysterectomy and radiation  . Thyroid cancer Sister 29       treated with thyroidectomy  . Cancer Maternal Uncle        d.80s unspecified type of cancer. History of smoking.  Marland Kitchen Uterine cancer Sister 42  . Colon cancer Cousin 80  paternal first-cousin    Social History   Tobacco Use  . Smoking status: Never Smoker  . Smokeless tobacco: Never Used  Vaping Use  . Vaping Use: Never used  Substance Use Topics  . Alcohol use: No  . Drug use: No    ROS   Objective:   Vitals: BP (!) 127/58   Pulse 94   Temp (!) 102.5 F (39.2 C) (Oral)   Resp 18   SpO2 100%   Physical Exam Constitutional:      General: She is not in acute distress.    Appearance: Normal appearance. She is well-developed and normal weight. She is not ill-appearing, toxic-appearing or diaphoretic.  HENT:     Head: Normocephalic and atraumatic.     Right Ear: External ear normal.     Left Ear: External ear normal.     Nose: Nose normal.     Mouth/Throat:     Mouth: Mucous membranes are moist.     Pharynx: Oropharynx is  clear.  Eyes:     General: No scleral icterus.       Right eye: No discharge.        Left eye: No discharge.     Extraocular Movements: Extraocular movements intact.     Conjunctiva/sclera: Conjunctivae normal.     Pupils: Pupils are equal, round, and reactive to light.  Cardiovascular:     Rate and Rhythm: Normal rate and regular rhythm.     Heart sounds: Normal heart sounds. No murmur heard.  No friction rub. No gallop.   Pulmonary:     Effort: Pulmonary effort is normal. No respiratory distress.     Breath sounds: Normal breath sounds. No stridor. No wheezing, rhonchi or rales.  Abdominal:     General: Bowel sounds are normal. There is no distension.     Palpations: Abdomen is soft. There is no mass.     Tenderness: There is no abdominal tenderness. There is no right CVA tenderness, left CVA tenderness, guarding or rebound.  Skin:    General: Skin is warm and dry.     Coloration: Skin is not pale.     Findings: No rash.  Neurological:     General: No focal deficit present.     Mental Status: She is alert and oriented to person, place, and time.  Psychiatric:        Mood and Affect: Mood normal.        Behavior: Behavior normal.        Thought Content: Thought content normal.        Judgment: Judgment normal.     Results for orders placed or performed during the hospital encounter of 07/21/19 (from the past 24 hour(s))  POCT urinalysis dip (device)     Status: Abnormal   Collection Time: 07/21/19  5:36 PM  Result Value Ref Range   Glucose, UA NEGATIVE NEGATIVE mg/dL   Bilirubin Urine NEGATIVE NEGATIVE   Ketones, ur 15 (A) NEGATIVE mg/dL   Specific Gravity, Urine 1.020 1.005 - 1.030   Hgb urine dipstick MODERATE (A) NEGATIVE   pH 6.0 5.0 - 8.0   Protein, ur 100 (A) NEGATIVE mg/dL   Urobilinogen, UA 0.2 0.0 - 1.0 mg/dL   Nitrite POSITIVE (A) NEGATIVE   Leukocytes,Ua LARGE (A) NEGATIVE    Assessment and Plan :   PDMP not reviewed this encounter.  1. Acute cystitis  with hematuria   2. Fatigue, unspecified type   3. Rectal adenocarcinoma ([place])  Start Keflex, urine culture pending. Recommended supportive care otherwise. Counseled patient on potential for adverse effects with medications prescribed/recommended today, ER and return-to-clinic precautions discussed, patient verbalized understanding.    Jaynee Eagles, [place] 07/21/19 1746

## 2019-07-21 NOTE — ED Triage Notes (Signed)
Pt reports a fall 1 wk ago; was seen and treated - told negative XRs.  States since incident, "I just don't feel right.  I feel like when I was diagnosed with cancer" years ago.  C/O exhaustion and poor appetite, and hot/cold spells.  Has been taking Tyl - none today

## 2019-07-23 LAB — URINE CULTURE: Culture: >=100000 — AB

## 2019-07-25 ENCOUNTER — Other Ambulatory Visit: Payer: Self-pay

## 2019-07-25 ENCOUNTER — Ambulatory Visit (INDEPENDENT_AMBULATORY_CARE_PROVIDER_SITE_OTHER): Payer: Medicare HMO | Admitting: Medical

## 2019-07-25 ENCOUNTER — Encounter: Payer: Self-pay | Admitting: Medical

## 2019-07-25 VITALS — BP 144/80 | HR 76 | Temp 98.7°F | Ht 61.0 in | Wt 176.8 lb

## 2019-07-25 DIAGNOSIS — S5002XA Contusion of left elbow, initial encounter: Secondary | ICD-10-CM

## 2019-07-25 DIAGNOSIS — S8001XA Contusion of right knee, initial encounter: Secondary | ICD-10-CM | POA: Diagnosis not present

## 2019-07-25 DIAGNOSIS — R936 Abnormal findings on diagnostic imaging of limbs: Secondary | ICD-10-CM

## 2019-07-25 DIAGNOSIS — R5381 Other malaise: Secondary | ICD-10-CM | POA: Diagnosis not present

## 2019-07-25 DIAGNOSIS — W19XXXA Unspecified fall, initial encounter: Secondary | ICD-10-CM

## 2019-07-25 DIAGNOSIS — M810 Age-related osteoporosis without current pathological fracture: Secondary | ICD-10-CM

## 2019-07-25 DIAGNOSIS — I7090 Unspecified atherosclerosis: Secondary | ICD-10-CM

## 2019-07-25 DIAGNOSIS — N3000 Acute cystitis without hematuria: Secondary | ICD-10-CM | POA: Diagnosis not present

## 2019-07-25 LAB — POCT URINALYSIS DIP (PROADVANTAGE DEVICE)
Bilirubin, UA: NEGATIVE
Glucose, UA: NEGATIVE mg/dL
Ketones, POC UA: NEGATIVE mg/dL
Leukocytes, UA: NEGATIVE
Nitrite, UA: NEGATIVE
Specific Gravity, Urine: 1.015
Urobilinogen, Ur: NEGATIVE
pH, UA: 6 (ref 5.0–8.0)

## 2019-07-25 NOTE — Progress Notes (Signed)
Subjective:  Tonya [name] is a 69 y.o. female who presents for Chief Complaint  Patient presents with  . Urinary Tract Infection    was seen at urgent care on Saturday and not feeling better      Went to urgent care last week, diagnosed with urinary tract infection.   Was put on antibiotic, is somewhat improved, but just doesn't feel right.   She had never had UTI before.    Was put on Keflex.  Hasn't finished antibiotic yet.  She reports currently hot vs cold, some night sweats, some chills, no appetite.   Has had fever 4-5 days ago.   No abdominal or back pain.   No burning with urination, no urinary frequency, but is somewhat thirsty, always thirsty.  Is not a diabetic though.  She went to the urgent care for concern of fatigue, anemia, and fall on July 10.  She had bruising to her left elbow, right knee, pain in the left elbow and left wrist.  She had a pending x-rays.  Her knee looks worse now but it does not really hurt whereas the left elbow still has some pain.  No cough, sore throat, respiratory symptoms.  No other aggravating or relieving factors.    No other c/o.  Past Medical History:  Diagnosis Date  . Anemia    few yrs ago  . C. difficile diarrhea 03/2018  . Cancer [facility])    rectal  . Coronary artery disease 07/2003   no current cardiologist  . History of chemotherapy 01/2018   radaition last tx jan 2019  . Myocardial infarction [facility]) age 22's  . Obesity 2003   s/p gastric bypass   . Vitamin D deficiency      The following portions of the patient's history were reviewed and updated as appropriate: allergies, current medications, past family history, past medical history, past social history, past surgical history and problem list.  ROS Otherwise as in subjective above  Objective: BP (!) 144/80   Pulse 76   Temp 98.7 F (37.1 C)   Ht 5\' 1"  (1.549 m)   Wt 176 lb 12.8 oz (80.2 kg)   SpO2 100%   BMI 33.41 kg/m    Wt Readings from Last 3  Encounters:  07/25/19 176 lb 12.8 oz (80.2 kg)  05/07/19 177 lb 6.4 oz (80.5 kg)  04/20/19 178 lb 12.8 oz (81.1 kg)   General appearance: alert, no distress, well developed, well nourished Neck: supple, no lymphadenopathy, no thyromegaly, no masses Heart: RRR, normal S1, S2, no murmurs Lungs: CTA bilaterally, no wheezes, rhonchi, or rales Abdomen: +bs, soft, non tender, non distended, no masses, no hepatomegaly, no splenomegaly Pulses: 2+ radial pulses, 2+ pedal pulses, normal cap refill Right lower leg with swelling likely dependent from the bruising of the right knee.  This is asymmetrical to the left but no palpable cord, no calf tenderness.  Purplish yellow bruising over the anterior right knee diffuse Tender over left elbow at the radial head and generalized purplish yellow bruising throughout the left elbow region, tenderness over left wrist in general, somewhat guarded with left arm in general from a recent fall and injury, relatively normal range of motion Lower extremity pulses 1-2+, Arms and legs seem to be neurovascularly intact Neuro: Alert and oriented x 3, answers questions appropriate, nonfocal exam    Assessment: Encounter Diagnoses  Name Primary?  . Acute cystitis without hematuria Yes  . Malaise   . Contusion of left elbow, initial  encounter   . Contusion of right knee, initial encounter   . Fall, initial encounter   . Abnormal x-ray of forearm   . Atherosclerosis   . Osteoporosis, unspecified osteoporosis type, unspecified pathological fracture presence      Plan: Urinary tract infection-we reviewed the recent labs and culture that was positive.  She will give the Keflex more time.  Hydrate well.  If not seeing improvement in the next 48 hours then recheck and consider labs, switching antibiotics  With her recent fall and injury of left elbow, left wrist, right knee, we reviewed her recent x-rays.  There was an addendum to the left elbow x-ray suggesting  possible subtle fracture of the radial head.  I asked her to use an arm sling over-the-counter for 1 to 2 hours at a time, ice therapy, but we will refer to orthopedics for further eval and management.  I asked her to avoid using the arm very much until she sees orthopedics.  We did discuss stretching the arm throughout the day to avoid frozen shoulder  Continue ice therapy elevation and rest with the right knee and left wrist  We also discussed findings of osteoporosis and atherosclerosis on x-rays.  She has of physical coming up with her PCP here to discuss this further   Tonya [name] was seen today for urinary tract infection.  Diagnoses and all orders for this visit:  Acute cystitis without hematuria  Malaise  Contusion of left elbow, initial encounter -     Ambulatory referral to Orthopedic Surgery  Contusion of right knee, initial encounter  Fall, initial encounter  Abnormal x-ray of forearm -     Ambulatory referral to Orthopedic Surgery  Atherosclerosis  Osteoporosis, unspecified osteoporosis type, unspecified pathological fracture presence    Follow up: with [name] soon as scheduled

## 2019-07-25 NOTE — Addendum Note (Signed)
Addended by: Edgar Frisk on: 07/25/2019 09:16 AM   Modules accepted: Orders

## 2019-07-25 NOTE — Patient Instructions (Signed)
Continue to hydrate well  Use the cranberry juice the next days  Finish Cephalexin antibiotic.  If not seeing significant improvements in symptoms in 48 hours, or if worse in the meantime such as spike in fever, nausea, vomiting, back pain or other new or worsening symptoms, then recheck.    We will refer to orthopedics for follow up on the elbow.  Use ice therapy 20 minutes on 20 minutes off a couple times per day for left elbow and right knee.  Use arm sling for 1-2 hours at a time for left elbow, no heavy lifting, twisting, or grasping with left hand for now given questionable radius subtle fracture on addended xray.  Return as scheduled with [name], and make sure yall discuss osteoporosis, atherosclerosis and recent findings.      Atherosclerosis  Atherosclerosis is narrowing and hardening of the arteries. Arteries are blood vessels that carry blood from the heart to all parts of the body. This blood contains oxygen. Arteries can become narrow or clogged with a buildup of fat, cholesterol, calcium, and other substances (plaque). Plaque decreases the amount of blood that can flow through the artery. Atherosclerosis can affect any artery in the body, including:  Heart arteries (coronary artery disease). This may cause a heart attack.  Brain arteries. This may cause a stroke (cerebrovascular accident).  Leg, arm, and pelvis arteries (peripheral artery disease). This may cause pain and numbness.  Kidney arteries. This may cause kidney (renal) failure. Treatment may slow the disease and prevent further damage to the heart, brain, peripheral arteries, and kidneys. What are the causes? Atherosclerosis develops slowly over many years. The inner layers of your arteries become damaged and allow the gradual buildup of plaque. The exact cause of atherosclerosis is not fully understood. Symptoms of atherosclerosis do not occur until the artery becomes narrow or blocked. What increases the  risk? The following factors may make you more likely to develop this condition:  High blood pressure.  High cholesterol.  Being middle-aged or older.  Having a family history of atherosclerosis.  Having high blood fats (triglycerides).  Diabetes.  Being overweight.  Smoking tobacco.  Not exercising enough (sedentary lifestyle).  Having a substance in the blood called C-reactive protein (CRP). This is a sign of increased levels of inflammation in the body.  Sleep apnea.  Being stressed.  Drinking too much alcohol. What are the signs or symptoms? This condition may not cause any symptoms. If you have symptoms, they are caused by damage to an area of your body that is not getting enough blood.  Coronary artery disease may cause chest pain and shortness of breath.  Decreased blood supply to your brain may cause a stroke. Signs of a stroke may include sudden: ? Weakness on one side of the body. ? Confusion. ? Changes in vision. ? Inability to speak or understand speech. ? Loss of balance, coordination, or the ability to walk. ? Severe headache. ? Loss of consciousness.  Peripheral arterial disease may cause pain and numbness, often in the legs and hips.  Renal failure may cause fatigue, nausea, swelling, and itchy skin. How is this diagnosed? This condition is diagnosed based on your medical history and a physical exam. During the exam:  Your health care provider will: ? Check your pulse in different places. ? Listen for a "whooshing" sound over your arteries (bruit).  You may have tests, such as: ? Blood tests to check your levels of cholesterol, triglycerides, and CRP. ? Electrocardiogram (ECG) to  check for heart damage. ? Chest X-ray to see if you have an enlarged heart, which is a sign of heart failure. ? Stress test to see how your heart reacts to exercise. ? Echocardiogram to get images of the inside of your heart. ? Ankle-brachial index to compare blood  pressure in your arms to blood pressure in your ankles. ? Ultrasound of your peripheral arteries to check blood flow. ? CT scan to check for damage to your heart or brain. ? X-rays of blood vessels after dye has been injected (angiogram) to check blood flow. How is this treated? Treatment starts with lifestyle changes, which may include:  Changing your diet.  Losing weight.  Reducing stress.  Exercising and being physically active more regularly.  Not smoking. You may also need medicine to:  Lower triglycerides and cholesterol.  Control blood pressure.  Prevent blood clots.  Lower inflammation in your body.  Control your blood sugar. Sometimes, surgery is needed to:  Remove plaque from an artery (endarterectomy).  Open or widen a narrowed heart artery (angioplasty).  Create a new path for your blood with one of these procedures: ? Heart (coronary) artery bypass graft surgery. ? Peripheral artery bypass graft surgery. Follow these instructions at home: Eating and drinking   Eat a heart-healthy diet. Talk with your health care provider or a diet and nutrition specialist (dietitian) if you need help. A heart-healthy diet involves: ? Limiting unhealthy fats and increasing healthy fats. Some examples of healthy fats are olive oil and canola oil. ? Eating plant-based foods, such as fruits, vegetables, nuts, whole grains, and legumes (such as peas and lentils).  Limit alcohol intake to no more than 1 drink a day for nonpregnant women and 2 drinks a day for men. One drink equals 12 oz of beer, 5 oz of wine, or 1 oz of hard liquor. Lifestyle  Follow an exercise program as told by your health care provider.  Maintain a healthy weight. Lose weight if your health care provider says that you need to do that.  Rest when you are tired.  Learn to manage your stress.  Do not use any products that contain nicotine or tobacco, such as cigarettes and e-cigarettes. If you need help  quitting, ask your health care provider.  Do not abuse drugs. General instructions  Take over-the-counter and prescription medicines only as told by your health care provider.  Manage other health conditions as told by your health care provider.  Keep all follow-up visits as told by your health care provider. This is important. Contact a health care provider if:  You have chest pain or discomfort. This includes squeezing chest pain that may feel like indigestion (angina).  You have shortness of breath.  You have an irregular heartbeat.  You have unexplained fatigue.  You have unexplained pain or numbness in an arm, leg, or hip.  You have nausea, swelling of your hands or feet, and itchy skin. Get help right away if:  You have any symptoms of a heart attack, such as: ? Chest pain. ? Shortness of breath. ? Pain in your neck, jaw, arms, back, or stomach. ? Cold sweat. ? Nausea. ? Light-headedness.  You have any symptoms of a stroke. "BE FAST" is an easy way to remember the main warning signs of a stroke: ? B - Balance. Signs are dizziness, sudden trouble walking, or loss of balance. ? E - Eyes. Signs are trouble seeing or a sudden change in vision. ? F - Face.   Signs are sudden weakness or numbness of the face, or the face or eyelid drooping on one side. ? A - Arms. Signs are weakness or numbness in an arm. This happens suddenly and usually on one side of the body. ? S - Speech. Signs are sudden trouble speaking, slurred speech, or trouble understanding what people say. ? T - Time. Time to call emergency services. Write down what time symptoms started.  You have other signs of a stroke, such as: ? A sudden, severe headache with no known cause. ? Nausea or vomiting. ? Seizure. These symptoms may represent a serious problem that is an emergency. Do not wait to see if the symptoms will go away. Get medical help right away. Call your local emergency services (911 in the [place] Do  not drive yourself to the hospital. Summary  Atherosclerosis is narrowing and hardening of the arteries.  Arteries can become narrow or clogged with a buildup of fat, cholesterol, calcium, and other substances (plaque).  This condition may not cause any symptoms. If you do have symptoms, they are caused by damage to an area of your body that is not getting enough blood.  Treatment may include lifestyle changes and medicines. In some cases, surgery is needed. This information is not intended to replace advice given to you by your health care provider. Make sure you discuss any questions you have with your health care provider. Document Revised: 04/01/2017 Document Reviewed: 08/26/2016 Elsevier Patient Education  2020 Elsevier Inc.      Osteoporosis  Osteoporosis happens when your bones get thin and weak. This can cause your bones to break (fracture) more easily. You can do things at home to make your bones stronger. Follow these instructions at home:  Activity  Exercise as told by your doctor. Ask your doctor what activities are safe for you. You should do: ? Exercises that make your muscles work to hold your body weight up (weight-bearing exercises). These include tai chi, yoga, and walking. ? Exercises to make your muscles stronger. One example is lifting weights. Lifestyle  Limit alcohol intake to no more than 1 drink a day for nonpregnant women and 2 drinks a day for men. One drink equals 12 oz of beer, 5 oz of wine, or 1 oz of hard liquor.  Do not use any products that have nicotine or tobacco in them. These include cigarettes and e-cigarettes. If you need help quitting, ask your doctor. Preventing falls  Use tools to help you move around (mobility aids) as needed. These include canes, walkers, scooters, and crutches.  Keep rooms well-lit and free of clutter.  Put away things that could make you trip. These include cords and rugs.  Install safety rails on stairs.  Install grab bars in bathrooms.  Use rubber mats in slippery areas, like bathrooms.  Wear shoes that: ? Fit you well. ? Support your feet. ? Have closed toes. ? Have rubber soles or low heels.  Tell your doctor about all of the medicines you are taking. Some medicines can make you more likely to fall. General instructions  Eat plenty of calcium and vitamin D. These nutrients are good for your bones. Good sources of calcium and vitamin D include: ? Some fatty fish, such as salmon and tuna. ? Foods that have calcium and vitamin D added to them (fortified foods). For example, some breakfast cereals are fortified with calcium and vitamin D. ? Egg yolks. ? Cheese. ? Liver.  Take over-the-counter and prescription medicines only   as told by your doctor.  Keep all follow-up visits as told by your doctor. This is important. Contact a doctor if:  You have not been tested (screened) for osteoporosis and you are: ? A woman who is age 65 or older. ? A man who is age 70 or older. Get help right away if:  You fall.  You get hurt. Summary  Osteoporosis happens when your bones get thin and weak.  Weak bones can break (fracture) more easily.  Eat plenty of calcium and vitamin D. These nutrients are good for your bones.  Tell your doctor about all of the medicines that you take. This information is not intended to replace advice given to you by your health care provider. Make sure you discuss any questions you have with your health care provider. Document Revised: 12/03/2016 Document Reviewed: 10/15/2016 Elsevier Patient Education  2020 Elsevier Inc.   

## 2019-07-30 ENCOUNTER — Ambulatory Visit: Payer: Medicare HMO | Admitting: Orthopedic Surgery

## 2019-07-30 ENCOUNTER — Ambulatory Visit (INDEPENDENT_AMBULATORY_CARE_PROVIDER_SITE_OTHER): Payer: Medicare HMO

## 2019-07-30 ENCOUNTER — Encounter: Payer: Self-pay | Admitting: Orthopedic Surgery

## 2019-07-30 DIAGNOSIS — M25522 Pain in left elbow: Secondary | ICD-10-CM

## 2019-07-30 NOTE — Progress Notes (Signed)
Office Visit Note   Patient: Tonya [name]           Date of Birth: [birth year]           MRN: [id number] Visit Date: 07/30/2019 Requested by: Carlena Hurl, PA-C [place],  [place] [id number] PCP: Girtha Rm, NP-C  Subjective: Chief Complaint  Patient presents with  . Left Elbow - Injury    HPI: Tonya [name] is a 68 year old patient with left elbow injury.  She fell on 07/14/2019 onto the left elbow.  She describes bruising swelling at that time.  She is right-hand dominant.  She is waking from sleep at night.  She states that her PA told her that her elbow was fractured.  That assessment is correct.  Review of radiographs from 07/14/2019 show lateral condyle fracture.              ROS: All systems reviewed are negative as they relate to the chief complaint within the history of present illness.  Patient denies  fevers or chills.   Assessment & Plan: Visit Diagnoses:  1. Pain in left elbow     Plan: Impression is left elbow injury lateral condyle fracture with some displacement.  She needs CT scan to evaluate the fracture displacement and for preop planning purposes.  We will see her back in 48 hours for decision for or against surgical intervention.  Fracture probably has some degree of healing at this point 2-1/2 weeks out from injury.  Follow-Up Instructions: No follow-ups on file.   Orders:  Orders Placed This Encounter  Procedures  . XR Elbow Complete Left (3+View)  . CT ELBOW LEFT WO CONTRAST   No orders of the defined types were placed in this encounter.     Procedures: No procedures performed   Clinical Data: No additional findings.  Objective: Vital Signs: There were no vitals taken for this visit.  Physical Exam:   Constitutional: Patient appears well-developed HEENT:  Head: Normocephalic Eyes:EOM are normal Neck: Normal range of motion Cardiovascular: Normal rate Pulmonary/chest: Effort normal Neurologic: Patient is  alert Skin: Skin is warm Psychiatric: Patient has normal mood and affect    Ortho Exam: Ortho exam demonstrates ecchymosis and swelling around that left elbow region.  She lacks about 45 degrees of full extension and flexes to about 100 degrees.  Pronation supination intact.  Biceps tendon palpable and triceps tendon palpable and functional.  Radial pulse intact bilaterally.  Specialty Comments:  No specialty comments available.  Imaging: XR Elbow Complete Left (3+View)  Result Date: 07/30/2019 AP lateral oblique left elbow reviewed.  Lateral condyle fracture is present.  Radial head looks intact.  Elbow is located.  Osteopenia present.  Displacement is approximately 3 to 4 mm.    PMFS History: Patient Active Problem List   Diagnosis Date Noted  . Abnormal x-ray of forearm 07/25/2019  . Fall 07/25/2019  . Contusion of right knee 07/25/2019  . Contusion of left elbow 07/25/2019  . Malaise 07/25/2019  . Acute cystitis without hematuria 07/25/2019  . Osteoporosis 07/25/2019  . Atherosclerosis 07/25/2019  . Depression, major, single episode, moderate ([place]) 04/20/2019  . Incontinence of feces 04/20/2019  . Port-A-Cath in place 04/20/2017  . Diarrhea 01/05/2017  . Rectal adenocarcinoma ([place]) 07/20/2016  . Iron deficiency anemia 07/20/2016  . Hemorrhoids 06/03/2016  . History of anemia 06/18/2015  . Fatigue 04/17/2014  . Obesity (BMI 30.0-34.9) 10/03/2013  . Vitamin D deficiency 11/15/2008  . CORONARY ARTERY DISEASE 11/15/2008  .  DIABETES MELLITUS, HX OF 11/15/2008  . HYPERTENSION, HX OF 11/15/2008   Past Medical History:  Diagnosis Date  . Anemia    few yrs ago  . C. difficile diarrhea 03/2018  . Cancer [facility])    rectal  . Coronary artery disease 07/2003   no current cardiologist  . History of chemotherapy 01/2018   radaition last tx jan 2019  . Myocardial infarction [facility]) age 8's  . Obesity 2003   s/p gastric bypass   . Vitamin D deficiency     Family History   Problem Relation Age of Onset  . Heart attack Mother        d.93  . Throat cancer Mother 76  . Clotting disorder Mother   . Liver disease Mother   . Kidney disease Mother   . Heart attack Father        d.92  . Prostate cancer Father 72  . Uterine cancer Sister 18       treated with total hysterectomy and radiation  . Thyroid cancer Sister 49       treated with thyroidectomy  . Cancer Maternal Uncle        d.80s unspecified type of cancer. History of smoking.  Marland Kitchen Uterine cancer Sister 19  . Colon cancer Cousin 24       paternal first-cousin    Past Surgical History:  Procedure Laterality Date  . ANAL RECTAL MANOMETRY N/A 11/01/2018   Procedure: ANO RECTAL MANOMETRY;  Surgeon: Leighton Ruff, MD;  Location: [facility];  Service: Endoscopy;  Laterality: N/A;  . CHOLECYSTECTOMY  1999  . GASTRIC BYPASS  2003  . IR FLUORO GUIDE PORT INSERTION RIGHT  01/25/2017  . IR REMOVAL TUN ACCESS W/ PORT W/O FL MOD SED  10/17/2018  . IR US GUIDE VASC ACCESS RIGHT  01/25/2017  . ostomy placed and removed  12/2017  . SACRAL NERVE STIMULATOR PLACEMENT  01-12-2019   DR Marcello Moores @[facility]    TEST PHASE   Social History   Occupational History  . Occupation: retired  Tobacco Use  . Smoking status: Never Smoker  . Smokeless tobacco: Never Used  Vaping Use  . Vaping Use: Never used  Substance and Sexual Activity  . Alcohol use: No  . Drug use: No  . Sexual activity: Not on file

## 2019-07-31 ENCOUNTER — Ambulatory Visit
Admission: RE | Admit: 2019-07-31 | Discharge: 2019-07-31 | Disposition: A | Discharge status: Home / Self Care | Payer: Medicare HMO | Source: Ambulatory Visit | Attending: Orthopedic Surgery | Admitting: Orthopedic Surgery

## 2019-07-31 DIAGNOSIS — M25522 Pain in left elbow: Secondary | ICD-10-CM

## 2019-07-31 DIAGNOSIS — S42492A Other displaced fracture of lower end of left humerus, initial encounter for closed fracture: Secondary | ICD-10-CM | POA: Diagnosis not present

## 2019-07-31 DIAGNOSIS — M25422 Effusion, left elbow: Secondary | ICD-10-CM | POA: Diagnosis not present

## 2019-07-31 NOTE — Progress Notes (Signed)
I called handy will do it thurs

## 2019-08-01 ENCOUNTER — Other Ambulatory Visit: Payer: Self-pay

## 2019-08-01 ENCOUNTER — Encounter (HOSPITAL_COMMUNITY): Payer: Self-pay | Admitting: Orthopedic Surgery

## 2019-08-01 ENCOUNTER — Ambulatory Visit: Payer: Medicare HMO | Admitting: Orthopedic Surgery

## 2019-08-01 ENCOUNTER — Other Ambulatory Visit (HOSPITAL_COMMUNITY)
Admission: RE | Admit: 2019-08-01 | Discharge: 2019-08-01 | Disposition: A | Discharge status: Home / Self Care | Payer: Medicare HMO | Source: Ambulatory Visit | Attending: Orthopedic Surgery | Admitting: Orthopedic Surgery

## 2019-08-01 DIAGNOSIS — Z20822 Contact with and (suspected) exposure to covid-19: Secondary | ICD-10-CM | POA: Diagnosis not present

## 2019-08-01 DIAGNOSIS — Z01812 Encounter for preprocedural laboratory examination: Secondary | ICD-10-CM | POA: Diagnosis not present

## 2019-08-01 LAB — SARS CORONAVIRUS 2 (TAT 6-24 HRS): SARS Coronavirus 2: NEGATIVE

## 2019-08-01 NOTE — Progress Notes (Signed)
Pt denies SOB, chest pain, and being under the care of a cardiologist. Pt stated that she last saw a cardiologist around 2015 and cannot recall their name. Pt stated that an echo was performed in 2015 and a stress test was done at least a year prior to that by the same doctor. In addition, a cardiac cath was performed > 7 years ago.  Pt stated that prior to her injury, she was able to do " EVERYTHING " She stated that she could walk up 2 flights of stairs w/o chest pain and complete all household chores. Pt denies having a chest x ray. Pt denies recent labs. Pt made aware to stop taking  vitamins, fish oil and herbal medications. Do not take any NSAIDs ie: Ibuprofen, Advil, Naproxen (Aleve), Motrin, BC and Goody Powder. Pt reminded to quarantine. Pt verbalized understanding of all pre-op instructions. PA, Anesthesiology, asked to review pt history.

## 2019-08-01 NOTE — Anesthesia Preprocedure Evaluation (Addendum)
Anesthesia Evaluation  Patient identified by MRN, date of birth, ID band Patient awake    Reviewed: Allergy & Precautions, H&P , NPO status , Patient's Chart, lab work & pertinent test results  Airway Mallampati: II   Neck ROM: full    Dental   Pulmonary neg pulmonary ROS,    breath sounds clear to auscultation       Cardiovascular + CAD   Rhythm:regular Rate:Normal  Chronically occluded LAD with right to left collaterals.   Neuro/Psych PSYCHIATRIC DISORDERS Depression    GI/Hepatic   Endo/Other    Renal/GU      Musculoskeletal   Abdominal   Peds  Hematology  (+) Blood dyscrasia, anemia ,   Anesthesia Other Findings   Reproductive/Obstetrics                             Anesthesia Physical Anesthesia Plan  ASA: III  Anesthesia Plan: General   Post-op Pain Management:  Regional for Post-op pain   Induction: Intravenous  PONV Risk Score and Plan: 3 and Ondansetron, Dexamethasone and Treatment may vary due to age or medical condition  Airway Management Planned: LMA  Additional Equipment:   Intra-op Plan:   Post-operative Plan: Extubation in OR  Informed Consent: I have reviewed the patients History and Physical, chart, labs and discussed the procedure including the risks, benefits and alternatives for the proposed anesthesia with the patient or authorized representative who has indicated his/her understanding and acceptance.       Plan Discussed with: CRNA, Anesthesiologist and Surgeon  Anesthesia Plan Comments: (PAT note written 08/01/2019 by Myra Gianotti, PA-C. SAME DAY WORK-UP   )       Anesthesia Quick Evaluation

## 2019-08-01 NOTE — Progress Notes (Signed)
Anesthesia Chart Review: SAME DAY WORK-UP   Case: [id number] Date/Time: 08/02/19 1006   Procedure: OPEN REDUCTION INTERNAL FIXATION (ORIF) LEFT ELBOW FRACTURE. (Left )   Anesthesia type: Choice   Pre-op diagnosis: Left distal humerus fracture.   Location: [facility] ROOM 03 / [place] [facility]   Surgeons: Altamese Alleghany, MD      DISCUSSION: Patient is a 69 year old female scheduled for the above procedure. Patient seen by Marcene Duos, MD on 07/30/19 for left distal humerus fracture following a fall on 07/14/19. Apparently initial xray imaging was read as no fracture, but addendum noted suspected proximal radial fracture. The addendum was noted at her primary care office on 07/25/19 follow-up, and she was referred to Ortho. Dr. Marlou Sa ordered a CT scan which showed left complex comminuted transchondral and intra-articular fracture of the distal humerus with possible subtle impaction type fracture involving the radial head and associated large joint effusion. She was referred to Dr. Marcelino Scot.   History includes never smoker, rectal cancer (s/p ultra low anterior resection of sigmoid colon and rectum with coloanal anastomosis and loop ileostomy 12/06/16; ileostomy closure 06/13/17; s/p chemoradiation 2018 and chemo 2019; developed postoperative diarrhea due to malabsorption, s/p exploratory laparotomy, LOA and creation of enteroenterostomy 03/28/18), CAD (reported MI in her 20's; known chronically occluded LAD with right-to-left collaterals 2005), obesity (s/p duodenal switch 2003; revision 03/28/18), anemia, C. difficile colitis (03/2018), fecal incontinence (s/p sacral nerve stimulator placement 01/12/19 & removal 01/19/19 due to limited improvement)  She does not currently have a cardiologist. She told PAT RN during phone interview that last cardiac testing was in 2015. In review of records, she was initially admitted for SIRS/hypotension in the setting of RLE cellulitis and possible anaphylactoid reaction to antibiotics, she was  treated with IVF for hydration and hypotension. She required readmission the day after discharge for volume overload/acute diastolic CHF. She was treated with IVF and had PCP follow-up but declined cardiology referral. Echo showed normal LVEF with grade 1 diastolic dysfunction. She thought she may have had a stress test around that time, but only echo seen. She reported that prior to her fall and fracture, she could do anything she wanted including climbing up two flights of stairs without chest pain.   2nd [place] vaccine 03/05/19. 08/01/19 COVID-19 negative. Patient with known LAD occlusion since prior to 2005. Last echo in 2015 showed normal LVEF and wall motion. She reports METS > 4 and has tolerated multiple procedures over the last 3 year as outlined above. Now with humerus fracture and need for ORIF. Discussed with anesthesiologist Bryson Ha, MD. Anesthesiologist to evaluate on the day of surgery and determine definitve anesthesia plan.     VS:   Wt Readings from Last 3 Encounters:  07/25/19 80.2 kg  05/07/19 80.5 kg  04/20/19 81.1 kg   BP Readings from Last 3 Encounters:  07/25/19 (!) 144/80  07/21/19 (!) 127/58  07/14/19 (!) 148/84   Pulse Readings from Last 3 Encounters:  07/25/19 76  07/21/19 94  07/14/19 80    PROVIDERS: Girtha Rm, NP-C his PCP Truitt Merle, MD is HEM-[facility] Kyung Rudd, MD is RAD-[facility]   LABS: For day of surgery. As of 04/05/19, CMET and CBC WNL. Recently treated with Keflex for E. Coli UTI.    IMAGES: CT left elbow 07/31/19: IMPRESSION: 1. Complex comminuted transchondral and intra-articular fracture of the distal humerus as described above. 2. Possible subtle impaction type fracture involving the radial head and neck. 3. Associated large joint effusion.  EKG: 01/12/19: Normal sinus rhythm Nonspecific T wave abnormality Abnormal ECG No significant change since last tracing Confirmed by Lauree Chandler [phone]) on 02/15/2019  6:17:57 PM   CV: Echo 05/07/13: Study Conclusions  Left ventricle: The cavity size was normal. Wall thickness  was increased in a pattern of mild LVH. Systolic function  was normal. The estimated ejection fraction was in the range  of 55% to 60%. Wall motion was normal; there were no  regional wall motion abnormalities. Doppler parameters are  consistent with abnormal left ventricular relaxation (grade  1 diastolic dysfunction).          MRI Cardiac 08/08/03: IMPRESSION  Findings consistent with prior infarct along the distal anterior wall (predominantly measuring one/third the thickness of the wall but at points measuring up to one/half the thickness of the wall). There is slight decreased motion at this level.   Calculated ejection fraction is 55.9 percent.    [place] 07/24/03 (had known LAD occlusion but done after stress Cardiolite showed anterior ischemia, EF 22%): IMPRESSION: 1. Normal right and left heart filling pressures with mild pulmonary hypertension. 2. Low normal to mildly decreased left ventricular systolic function. LVEF 50-60%. 3. One-vessel coronary artery disease characterized by 100% occlusion of the left anterior descending artery with right-to-left collaterals. Left circumflex and is normal. Right coronary artery is dominant with 20% proximal stenosis. PLAN:  In summary, this has not appeared to have changed significantly from previous catheterization.  The question is whether the patient would benefit from vascularization with left internal mammary artery to the LAD.  At this point, we will schedule a cardiac MRI to assess for both ischemia and viability in the anterior wall to determine whether revascularization would be beneficial.   Past Medical History:  Diagnosis Date  . Anemia    few yrs ago  . C. difficile diarrhea 03/2018  . Cancer ([place])    rectal  . Coronary artery disease 07/2003   occluded LAD wtih right-to-left collaterals 07/24/03; no current  cardiologist  . Elbow fracture, left   . History of chemotherapy 01/2018   radaition last tx jan 2019  . Myocardial infarction [facility]) age 49's  . Obesity 2003   s/p gastric bypass   . Vitamin D deficiency     Past Surgical History:  Procedure Laterality Date  . ANAL RECTAL MANOMETRY N/A 11/01/2018   Procedure: ANO RECTAL MANOMETRY;  Surgeon: Leighton Ruff, MD;  Location: [facility] ENDOSCOPY;  Service: Endoscopy;  Laterality: N/A;  . CHOLECYSTECTOMY  1999  . GASTRIC BYPASS  2003  . IR FLUORO GUIDE PORT INSERTION RIGHT  01/25/2017  . IR REMOVAL TUN ACCESS W/ PORT W/O FL MOD SED  10/17/2018  . IR US GUIDE VASC ACCESS RIGHT  01/25/2017  . ostomy placed and removed  12/2017  . SACRAL NERVE STIMULATOR PLACEMENT  01-12-2019   DR Marcello Moores @[facility]    TEST PHASE    MEDICATIONS: No current facility-administered medications for this encounter.   Marland Kitchen acetaminophen (TYLENOL) 500 MG tablet  . aspirin 81 MG chewable tablet  . cephALEXin (KEFLEX) 500 MG capsule  . Cholecalciferol (VITAMIN D3) 2000 units capsule  . ferrous sulfate (FERROUSUL) 325 (65 FE) MG tablet  . Multiple Vitamin (MULTIVITAMIN) tablet  . citalopram (CELEXA) 20 MG tablet   . Tbo-Filgrastim Galen Daft) injection 480 mcg    Myra Gianotti, PA-C Surgical Short Stay/Anesthesiology [facility] Phone [phone] [facility] Phone [phone] 08/01/2019 4:11 PM

## 2019-08-02 ENCOUNTER — Other Ambulatory Visit: Payer: Self-pay

## 2019-08-02 ENCOUNTER — Ambulatory Visit (HOSPITAL_COMMUNITY): Payer: Medicare HMO | Admitting: Vascular Surgery

## 2019-08-02 ENCOUNTER — Encounter (HOSPITAL_COMMUNITY): Payer: Self-pay | Admitting: Orthopedic Surgery

## 2019-08-02 ENCOUNTER — Ambulatory Visit (HOSPITAL_COMMUNITY): Payer: Medicare HMO

## 2019-08-02 ENCOUNTER — Encounter (HOSPITAL_COMMUNITY)
Admission: RE | Disposition: A | Discharge status: Home / Self Care | Payer: Self-pay | Source: Home / Self Care | Attending: Orthopedic Surgery

## 2019-08-02 ENCOUNTER — Ambulatory Visit (HOSPITAL_COMMUNITY)
Admission: RE | Admit: 2019-08-02 | Discharge: 2019-08-02 | Disposition: A | Discharge status: Home / Self Care | Payer: Medicare HMO | Attending: Orthopedic Surgery | Admitting: Orthopedic Surgery

## 2019-08-02 DIAGNOSIS — Z882 Allergy status to sulfonamides status: Secondary | ICD-10-CM | POA: Insufficient documentation

## 2019-08-02 DIAGNOSIS — Z880 Allergy status to penicillin: Secondary | ICD-10-CM | POA: Diagnosis not present

## 2019-08-02 DIAGNOSIS — Z6833 Body mass index (BMI) 33.0-33.9, adult: Secondary | ICD-10-CM | POA: Diagnosis not present

## 2019-08-02 DIAGNOSIS — I272 Pulmonary hypertension, unspecified: Secondary | ICD-10-CM | POA: Insufficient documentation

## 2019-08-02 DIAGNOSIS — D649 Anemia, unspecified: Secondary | ICD-10-CM | POA: Diagnosis not present

## 2019-08-02 DIAGNOSIS — Z923 Personal history of irradiation: Secondary | ICD-10-CM | POA: Insufficient documentation

## 2019-08-02 DIAGNOSIS — Z8042 Family history of malignant neoplasm of prostate: Secondary | ICD-10-CM | POA: Insufficient documentation

## 2019-08-02 DIAGNOSIS — K529 Noninfective gastroenteritis and colitis, unspecified: Secondary | ICD-10-CM | POA: Diagnosis not present

## 2019-08-02 DIAGNOSIS — S42472A Displaced transcondylar fracture of left humerus, initial encounter for closed fracture: Secondary | ICD-10-CM | POA: Diagnosis not present

## 2019-08-02 DIAGNOSIS — E669 Obesity, unspecified: Secondary | ICD-10-CM | POA: Diagnosis not present

## 2019-08-02 DIAGNOSIS — Z8349 Family history of other endocrine, nutritional and metabolic diseases: Secondary | ICD-10-CM | POA: Insufficient documentation

## 2019-08-02 DIAGNOSIS — Z01818 Encounter for other preprocedural examination: Secondary | ICD-10-CM

## 2019-08-02 DIAGNOSIS — Z832 Family history of diseases of the blood and blood-forming organs and certain disorders involving the immune mechanism: Secondary | ICD-10-CM | POA: Diagnosis not present

## 2019-08-02 DIAGNOSIS — Y939 Activity, unspecified: Secondary | ICD-10-CM | POA: Insufficient documentation

## 2019-08-02 DIAGNOSIS — Z8249 Family history of ischemic heart disease and other diseases of the circulatory system: Secondary | ICD-10-CM | POA: Diagnosis not present

## 2019-08-02 DIAGNOSIS — I2510 Atherosclerotic heart disease of native coronary artery without angina pectoris: Secondary | ICD-10-CM | POA: Insufficient documentation

## 2019-08-02 DIAGNOSIS — Z881 Allergy status to other antibiotic agents status: Secondary | ICD-10-CM | POA: Insufficient documentation

## 2019-08-02 DIAGNOSIS — W19XXXA Unspecified fall, initial encounter: Secondary | ICD-10-CM | POA: Insufficient documentation

## 2019-08-02 DIAGNOSIS — R69 Illness, unspecified: Secondary | ICD-10-CM | POA: Diagnosis not present

## 2019-08-02 DIAGNOSIS — Z841 Family history of disorders of kidney and ureter: Secondary | ICD-10-CM | POA: Insufficient documentation

## 2019-08-02 DIAGNOSIS — Z9221 Personal history of antineoplastic chemotherapy: Secondary | ICD-10-CM | POA: Diagnosis not present

## 2019-08-02 DIAGNOSIS — E559 Vitamin D deficiency, unspecified: Secondary | ICD-10-CM | POA: Diagnosis not present

## 2019-08-02 DIAGNOSIS — I252 Old myocardial infarction: Secondary | ICD-10-CM | POA: Diagnosis not present

## 2019-08-02 DIAGNOSIS — Z85048 Personal history of other malignant neoplasm of rectum, rectosigmoid junction, and anus: Secondary | ICD-10-CM | POA: Insufficient documentation

## 2019-08-02 DIAGNOSIS — I2582 Chronic total occlusion of coronary artery: Secondary | ICD-10-CM | POA: Diagnosis not present

## 2019-08-02 DIAGNOSIS — Y929 Unspecified place or not applicable: Secondary | ICD-10-CM | POA: Diagnosis not present

## 2019-08-02 DIAGNOSIS — F329 Major depressive disorder, single episode, unspecified: Secondary | ICD-10-CM | POA: Diagnosis not present

## 2019-08-02 DIAGNOSIS — S42402D Unspecified fracture of lower end of left humerus, subsequent encounter for fracture with routine healing: Secondary | ICD-10-CM | POA: Diagnosis not present

## 2019-08-02 DIAGNOSIS — Z8379 Family history of other diseases of the digestive system: Secondary | ICD-10-CM | POA: Insufficient documentation

## 2019-08-02 DIAGNOSIS — Z9884 Bariatric surgery status: Secondary | ICD-10-CM | POA: Insufficient documentation

## 2019-08-02 DIAGNOSIS — G8918 Other acute postprocedural pain: Secondary | ICD-10-CM | POA: Diagnosis not present

## 2019-08-02 DIAGNOSIS — S42462A Displaced fracture of medial condyle of left humerus, initial encounter for closed fracture: Secondary | ICD-10-CM | POA: Diagnosis not present

## 2019-08-02 DIAGNOSIS — Z8049 Family history of malignant neoplasm of other genital organs: Secondary | ICD-10-CM | POA: Insufficient documentation

## 2019-08-02 DIAGNOSIS — Z888 Allergy status to other drugs, medicaments and biological substances status: Secondary | ICD-10-CM | POA: Diagnosis not present

## 2019-08-02 DIAGNOSIS — Z800 Family history of malignant neoplasm of digestive organs: Secondary | ICD-10-CM | POA: Insufficient documentation

## 2019-08-02 DIAGNOSIS — Z7982 Long term (current) use of aspirin: Secondary | ICD-10-CM | POA: Insufficient documentation

## 2019-08-02 DIAGNOSIS — T148XXA Other injury of unspecified body region, initial encounter: Secondary | ICD-10-CM

## 2019-08-02 DIAGNOSIS — Z79899 Other long term (current) drug therapy: Secondary | ICD-10-CM | POA: Insufficient documentation

## 2019-08-02 DIAGNOSIS — Z9049 Acquired absence of other specified parts of digestive tract: Secondary | ICD-10-CM | POA: Diagnosis not present

## 2019-08-02 DIAGNOSIS — I10 Essential (primary) hypertension: Secondary | ICD-10-CM | POA: Diagnosis not present

## 2019-08-02 HISTORY — DX: Unspecified fracture of lower end of left humerus, initial encounter for closed fracture: S42.402A

## 2019-08-02 HISTORY — PX: ORIF HUMERUS FRACTURE: SHX2126

## 2019-08-02 HISTORY — DX: Presence of spectacles and contact lenses: Z97.3

## 2019-08-02 LAB — URINALYSIS, ROUTINE W REFLEX MICROSCOPIC
Bilirubin Urine: NEGATIVE
Glucose, UA: NEGATIVE mg/dL
Ketones, ur: NEGATIVE mg/dL
Nitrite: NEGATIVE
Protein, ur: NEGATIVE mg/dL
Specific Gravity, Urine: 1.017 (ref 1.005–1.030)
pH: 5 (ref 5.0–8.0)

## 2019-08-02 LAB — COMPREHENSIVE METABOLIC PANEL
ALT: 16 U/L (ref 0–44)
AST: 16 U/L (ref 15–41)
Albumin: 3.1 g/dL — ABNORMAL LOW (ref 3.5–5.0)
Alkaline Phosphatase: 60 U/L (ref 38–126)
Anion gap: 10 (ref 5–15)
BUN: 12 mg/dL (ref 8–23)
CO2: 25 mmol/L (ref 22–32)
Calcium: 9.1 mg/dL (ref 8.9–10.3)
Chloride: 105 mmol/L (ref 98–111)
Creatinine, Ser: 0.77 mg/dL (ref 0.44–1.00)
GFR calc Af Amer: >60 mL/min (ref >60)
GFR calc non Af Amer: >60 mL/min (ref >60)
Glucose, Bld: 103 mg/dL — ABNORMAL HIGH (ref 70–99)
Potassium: 3.8 mmol/L (ref 3.5–5.1)
Sodium: 140 mmol/L (ref 135–145)
Total Bilirubin: 0.4 mg/dL (ref 0.3–1.2)
Total Protein: 6.1 g/dL — ABNORMAL LOW (ref 6.5–8.1)

## 2019-08-02 LAB — CBC WITH DIFFERENTIAL/PLATELET
Abs Immature Granulocytes: 0.04 10*3/uL (ref 0.00–0.07)
Basophils Absolute: 0 10*3/uL (ref 0.0–0.1)
Basophils Relative: 1 %
Eosinophils Absolute: 0.1 10*3/uL (ref 0.0–0.5)
Eosinophils Relative: 1 %
HCT: 34.7 % — ABNORMAL LOW (ref 36.0–46.0)
Hemoglobin: 10.4 g/dL — ABNORMAL LOW (ref 12.0–15.0)
Immature Granulocytes: 1 %
Lymphocytes Relative: 9 %
Lymphs Abs: 0.5 10*3/uL — ABNORMAL LOW (ref 0.7–4.0)
MCH: 29.6 pg (ref 26.0–34.0)
MCHC: 30 g/dL (ref 30.0–36.0)
MCV: 98.9 fL (ref 80.0–100.0)
Monocytes Absolute: 0.5 10*3/uL (ref 0.1–1.0)
Monocytes Relative: 8 %
Neutro Abs: 5 10*3/uL (ref 1.7–7.7)
Neutrophils Relative %: 80 %
Platelets: 381 10*3/uL (ref 150–400)
RBC: 3.51 MIL/uL — ABNORMAL LOW (ref 3.87–5.11)
RDW: 13.8 % (ref 11.5–15.5)
WBC: 6.1 10*3/uL (ref 4.0–10.5)
nRBC: 0 % (ref 0.0–0.2)

## 2019-08-02 LAB — VITAMIN D 25 HYDROXY (VIT D DEFICIENCY, FRACTURES): Vit D, 25-Hydroxy: 41.3 ng/mL (ref 30–100)

## 2019-08-02 LAB — APTT: aPTT: 29 seconds (ref 24–36)

## 2019-08-02 LAB — PROTIME-INR
INR: 1 (ref 0.8–1.2)
Prothrombin Time: 13 seconds (ref 11.4–15.2)

## 2019-08-02 SURGERY — OPEN REDUCTION INTERNAL FIXATION (ORIF) DISTAL HUMERUS FRACTURE
Anesthesia: General | Laterality: Left

## 2019-08-02 MED ORDER — LIDOCAINE 2% (20 MG/ML) 5 ML SYRINGE
INTRAMUSCULAR | Status: AC
  Filled 2019-08-02: qty 5

## 2019-08-02 MED ORDER — FENTANYL CITRATE (PF) 250 MCG/5ML IJ SOLN
INTRAMUSCULAR | Status: DC | PRN
  Administered 2019-08-02 (×3): 50 ug via INTRAVENOUS

## 2019-08-02 MED ORDER — ONDANSETRON HCL 4 MG/2ML IJ SOLN
INTRAMUSCULAR | Status: DC | PRN
  Administered 2019-08-02: 4 mg via INTRAVENOUS

## 2019-08-02 MED ORDER — DEXAMETHASONE SODIUM PHOSPHATE 10 MG/ML IJ SOLN
INTRAMUSCULAR | Status: AC
  Filled 2019-08-02: qty 1

## 2019-08-02 MED ORDER — FENTANYL CITRATE (PF) 100 MCG/2ML IJ SOLN
INTRAMUSCULAR | Status: AC
  Filled 2019-08-02: qty 2

## 2019-08-02 MED ORDER — CHLORHEXIDINE GLUCONATE 0.12 % MT SOLN
15.0000 mL | Freq: Once | OROMUCOSAL | Status: AC
  Administered 2019-08-02: 15 mL via OROMUCOSAL

## 2019-08-02 MED ORDER — GABAPENTIN 300 MG PO CAPS
300.0000 mg | ORAL_CAPSULE | Freq: Once | ORAL | Status: AC
  Administered 2019-08-02: 300 mg via ORAL
  Filled 2019-08-02: qty 1

## 2019-08-02 MED ORDER — ONDANSETRON 4 MG PO TBDP
4.0000 mg | ORAL_TABLET | Freq: Three times a day (TID) | ORAL | 0 refills | Status: DC | PRN
Start: 2019-08-02 — End: 2020-01-28

## 2019-08-02 MED ORDER — LIDOCAINE 2% (20 MG/ML) 5 ML SYRINGE
INTRAMUSCULAR | Status: DC | PRN
  Administered 2019-08-02: 50 mg via INTRAVENOUS

## 2019-08-02 MED ORDER — LACTATED RINGERS IV SOLN: INTRAVENOUS | Status: DC

## 2019-08-02 MED ORDER — FENTANYL CITRATE (PF) 100 MCG/2ML IJ SOLN
INTRAMUSCULAR | Status: DC | PRN
  Administered 2019-08-02 (×2): 50 ug via INTRAVENOUS

## 2019-08-02 MED ORDER — PHENYLEPHRINE 40 MCG/ML (10ML) SYRINGE FOR IV PUSH (FOR BLOOD PRESSURE SUPPORT)
PREFILLED_SYRINGE | INTRAVENOUS | Status: DC | PRN
  Administered 2019-08-02: 120 ug via INTRAVENOUS

## 2019-08-02 MED ORDER — OXYCODONE HCL 5 MG/5ML PO SOLN: 5.0000 mg | Freq: Once | ORAL | Status: DC | PRN

## 2019-08-02 MED ORDER — PROPOFOL 10 MG/ML IV BOLUS
INTRAVENOUS | Status: AC
  Filled 2019-08-02: qty 20

## 2019-08-02 MED ORDER — HYDROCODONE-ACETAMINOPHEN 5-325 MG PO TABS: 1.0000 | ORAL_TABLET | Freq: Four times a day (QID) | ORAL | 0 refills | Status: DC | PRN

## 2019-08-02 MED ORDER — DEXAMETHASONE SODIUM PHOSPHATE 10 MG/ML IJ SOLN
INTRAMUSCULAR | Status: DC | PRN
  Administered 2019-08-02: 10 mg via INTRAVENOUS

## 2019-08-02 MED ORDER — SUGAMMADEX SODIUM 200 MG/2ML IV SOLN
INTRAVENOUS | Status: DC | PRN
  Administered 2019-08-02: 200 mg via INTRAVENOUS

## 2019-08-02 MED ORDER — 0.9 % SODIUM CHLORIDE (POUR BTL) OPTIME
TOPICAL | Status: DC | PRN
  Administered 2019-08-02: 1000 mL

## 2019-08-02 MED ORDER — ROCURONIUM BROMIDE 10 MG/ML (PF) SYRINGE
PREFILLED_SYRINGE | INTRAVENOUS | Status: AC
  Filled 2019-08-02: qty 10

## 2019-08-02 MED ORDER — ONDANSETRON HCL 4 MG/2ML IJ SOLN: 4.0000 mg | Freq: Four times a day (QID) | INTRAMUSCULAR | Status: DC | PRN

## 2019-08-02 MED ORDER — FENTANYL CITRATE (PF) 100 MCG/2ML IJ SOLN: 25.0000 ug | INTRAMUSCULAR | Status: DC | PRN

## 2019-08-02 MED ORDER — MIDAZOLAM HCL 2 MG/2ML IJ SOLN
INTRAMUSCULAR | Status: DC | PRN
Start: 2019-08-02 — End: 2019-08-02
  Administered 2019-08-02 (×2): 1 mg via INTRAVENOUS

## 2019-08-02 MED ORDER — CHLORHEXIDINE GLUCONATE 0.12 % MT SOLN
OROMUCOSAL | Status: AC
  Filled 2019-08-02: qty 15

## 2019-08-02 MED ORDER — OXYCODONE HCL 5 MG PO TABS: 5.0000 mg | ORAL_TABLET | Freq: Once | ORAL | Status: DC | PRN

## 2019-08-02 MED ORDER — FENTANYL CITRATE (PF) 250 MCG/5ML IJ SOLN
INTRAMUSCULAR | Status: AC
  Filled 2019-08-02: qty 5

## 2019-08-02 MED ORDER — PHENYLEPHRINE HCL-NACL 10-0.9 MG/250ML-% IV SOLN
INTRAVENOUS | Status: DC | PRN
  Administered 2019-08-02: 25 ug/min via INTRAVENOUS

## 2019-08-02 MED ORDER — BUPIVACAINE-EPINEPHRINE (PF) 0.5% -1:200000 IJ SOLN
INTRAMUSCULAR | Status: DC | PRN
Start: 2019-08-02 — End: 2019-08-02
  Administered 2019-08-02: 25 mL via PERINEURAL

## 2019-08-02 MED ORDER — METHOCARBAMOL 500 MG PO TABS: 500.0000 mg | ORAL_TABLET | Freq: Four times a day (QID) | ORAL | 0 refills | Status: DC | PRN

## 2019-08-02 MED ORDER — MIDAZOLAM HCL 2 MG/2ML IJ SOLN
INTRAMUSCULAR | Status: AC
  Filled 2019-08-02: qty 2

## 2019-08-02 MED ORDER — ROCURONIUM BROMIDE 10 MG/ML (PF) SYRINGE
PREFILLED_SYRINGE | INTRAVENOUS | Status: DC | PRN
  Administered 2019-08-02 (×2): 50 mg via INTRAVENOUS
  Administered 2019-08-02: 10 mg via INTRAVENOUS

## 2019-08-02 MED ORDER — ORAL CARE MOUTH RINSE: 15.0000 mL | Freq: Once | OROMUCOSAL | Status: AC

## 2019-08-02 MED ORDER — DEXMEDETOMIDINE (PRECEDEX) IN NS 20 MCG/5ML (4 MCG/ML) IV SYRINGE
PREFILLED_SYRINGE | INTRAVENOUS | Status: DC | PRN
  Administered 2019-08-02: 8 ug via INTRAVENOUS

## 2019-08-02 MED ORDER — ACETAMINOPHEN 500 MG PO TABS
1000.0000 mg | ORAL_TABLET | Freq: Once | ORAL | Status: DC
  Filled 2019-08-02: qty 2

## 2019-08-02 MED ORDER — PROPOFOL 10 MG/ML IV BOLUS
INTRAVENOUS | Status: DC | PRN
  Administered 2019-08-02: 160 mg via INTRAVENOUS

## 2019-08-02 MED ORDER — CEFAZOLIN SODIUM-DEXTROSE 2-4 GM/100ML-% IV SOLN
2.0000 g | INTRAVENOUS | Status: AC
  Administered 2019-08-02: 2 g via INTRAVENOUS
  Filled 2019-08-02: qty 100

## 2019-08-02 MED ORDER — ONDANSETRON HCL 4 MG/2ML IJ SOLN
INTRAMUSCULAR | Status: AC
  Filled 2019-08-02: qty 2

## 2019-08-02 SURGICAL SUPPLY — 66 items
BIT DRILL MICR ACTRK 2 LNG PRF (BIT) IMPLANT
BNDG CMPR 9X4 STRL LF SNTH (GAUZE/BANDAGES/DRESSINGS) ×1
BNDG CMPR MED 10X6 ELC LF (GAUZE/BANDAGES/DRESSINGS) ×1
BNDG COHESIVE 4X5 TAN STRL (GAUZE/BANDAGES/DRESSINGS) ×2 IMPLANT
BNDG ELASTIC 3X5.8 VLCR STR LF (GAUZE/BANDAGES/DRESSINGS) ×1 IMPLANT
BNDG ELASTIC 4X5.8 VLCR STR LF (GAUZE/BANDAGES/DRESSINGS) ×1 IMPLANT
BNDG ELASTIC 6X10 VLCR STRL LF (GAUZE/BANDAGES/DRESSINGS) ×1 IMPLANT
BNDG ESMARK 4X9 LF (GAUZE/BANDAGES/DRESSINGS) ×2 IMPLANT
BNDG GAUZE ELAST 4 BULKY (GAUZE/BANDAGES/DRESSINGS) ×4 IMPLANT
BRUSH SCRUB EZ PLAIN DRY (MISCELLANEOUS) ×4 IMPLANT
CORD BIPOLAR FORCEPS 12FT (ELECTRODE) IMPLANT
COVER SURGICAL LIGHT HANDLE (MISCELLANEOUS) ×2 IMPLANT
DRAIN PENROSE 1/4X12 LTX STRL (WOUND CARE) ×1 IMPLANT
DRAPE C-ARM 42X72 X-RAY (DRAPES) ×2 IMPLANT
DRAPE INCISE IOBAN 66X45 STRL (DRAPES) IMPLANT
DRAPE ORTHO SPLIT 77X108 STRL (DRAPES) ×4
DRAPE SURG ORHT 6 SPLT 77X108 (DRAPES) ×1 IMPLANT
DRAPE U-SHAPE 47X51 STRL (DRAPES) ×4 IMPLANT
DRILL MICRO ACUTRAK 2 LNG PROF (BIT) ×2
DRSG ADAPTIC 3X8 NADH LF (GAUZE/BANDAGES/DRESSINGS) ×2 IMPLANT
DRSG MEPITEL 4X7.2 (GAUZE/BANDAGES/DRESSINGS) ×2 IMPLANT
DRSG PAD ABDOMINAL 8X10 ST (GAUZE/BANDAGES/DRESSINGS) ×2 IMPLANT
ELECT REM PT RETURN 9FT ADLT (ELECTROSURGICAL) ×2
ELECTRODE REM PT RTRN 9FT ADLT (ELECTROSURGICAL) ×1 IMPLANT
GAUZE SPONGE 4X4 12PLY STRL (GAUZE/BANDAGES/DRESSINGS) ×4 IMPLANT
GAUZE SPONGE 4X4 12PLY STRL LF (GAUZE/BANDAGES/DRESSINGS) ×2 IMPLANT
GLOVE BIO SURGEON STRL SZ7.5 (GLOVE) ×2 IMPLANT
GLOVE BIOGEL PI IND STRL 7.5 (GLOVE) ×1 IMPLANT
GLOVE BIOGEL PI IND STRL 8 (GLOVE) ×1 IMPLANT
GLOVE BIOGEL PI INDICATOR 7.5 (GLOVE) ×1
GLOVE BIOGEL PI INDICATOR 8 (GLOVE) ×1
GOWN STRL REUS W/ TWL LRG LVL3 (GOWN DISPOSABLE) ×1 IMPLANT
GOWN STRL REUS W/ TWL XL LVL3 (GOWN DISPOSABLE) ×2 IMPLANT
GOWN STRL REUS W/TWL LRG LVL3 (GOWN DISPOSABLE) ×2
GOWN STRL REUS W/TWL XL LVL3 (GOWN DISPOSABLE) ×4
GUIDEWIRE ORTHO MICROSHT  ACUT (WIRE) ×5
GUIDEWIRE ORTHO MICROSHT .035 (WIRE) IMPLANT
GUIDEWIRE ORTHO MINI ACTK .045 (WIRE) ×2 IMPLANT
KIT BASIN OR (CUSTOM PROCEDURE TRAY) ×2 IMPLANT
KIT TURNOVER KIT B (KITS) ×2 IMPLANT
NEEDLE HYPO 25X1 1.5 SAFETY (NEEDLE) IMPLANT
NS IRRIG 1000ML POUR BTL (IV SOLUTION) ×2 IMPLANT
PACK ORTHO EXTREMITY (CUSTOM PROCEDURE TRAY) ×2 IMPLANT
PAD ABD 8X10 STRL (GAUZE/BANDAGES/DRESSINGS) ×2 IMPLANT
PAD ARMBOARD 7.5X6 YLW CONV (MISCELLANEOUS) ×4 IMPLANT
PAD CAST 4YDX4 CTTN HI CHSV (CAST SUPPLIES) IMPLANT
PADDING CAST COTTON 4X4 STRL (CAST SUPPLIES) ×2
PADDING CAST COTTON 6X4 STRL (CAST SUPPLIES) ×2 IMPLANT
SCREW ACUTRACK 22MM (Screw) ×4 IMPLANT
SCREW BONE ACUTRK 2.5X22MM (Screw) IMPLANT
SPONGE LAP 18X18 RF (DISPOSABLE) IMPLANT
STOCKINETTE IMPERVIOUS 9X36 MD (GAUZE/BANDAGES/DRESSINGS) ×2 IMPLANT
SUT ETHILON 3 0 PS 1 (SUTURE) ×4 IMPLANT
SUT FIBERWIRE 2-0 18 17.9 3/8 (SUTURE) ×2
SUT VIC AB 0 CT1 27 (SUTURE) ×2
SUT VIC AB 0 CT1 27XBRD ANBCTR (SUTURE) ×1 IMPLANT
SUT VIC AB 2-0 CT1 27 (SUTURE) ×4
SUT VIC AB 2-0 CT1 TAPERPNT 27 (SUTURE) ×2 IMPLANT
SUTURE FIBERWR 2-0 18 17.9 3/8 (SUTURE) ×1 IMPLANT
SYR 5ML LL (SYRINGE) IMPLANT
SYR BULB EAR ULCER 3OZ GRN STR (SYRINGE) ×1 IMPLANT
SYR CONTROL 10ML LL (SYRINGE) IMPLANT
TOWEL GREEN STERILE (TOWEL DISPOSABLE) ×6 IMPLANT
TOWEL GREEN STERILE FF (TOWEL DISPOSABLE) ×2 IMPLANT
TRAY FOLEY MTR SLVR 16FR STAT (SET/KITS/TRAYS/PACK) IMPLANT
YANKAUER SUCT BULB TIP NO VENT (SUCTIONS) IMPLANT

## 2019-08-02 NOTE — Brief Op Note (Signed)
08/02/2019  7:22 PM  PATIENT:  Tonya [name]  69 y.o. female  PRE-OPERATIVE DIAGNOSIS:  Left distal humerus trochlear shear fracture.  POST-OPERATIVE DIAGNOSIS:  Left distal humerus trochlear shear fracture.  PROCEDURE:  Procedure(s): OPEN REDUCTION INTERNAL FIXATION (ORIF) LEFT ELBOW TROCHLEAR SHEAR FRACTURE. (Left)  SURGEON:  Surgeon(s) and Role:    * Altamese Goodland, MD - Primary  PHYSICIAN ASSISTANT: Ainsley Spinner, PA-C  ANESTHESIA:   general  EBL:  25 mL   BLOOD ADMINISTERED:none  DRAINS: none   LOCAL MEDICATIONS USED:  NONE  SPECIMEN:  No Specimen  DISPOSITION OF SPECIMEN:  N/A  COUNTS:  YES  TOURNIQUET:   Total Tourniquet Time Documented: Upper Arm (Left) - 107 minutes Total: Upper Arm (Left) - 107 minutes   DICTATION: .Other Dictation: Dictation Number [id number]  PLAN OF CARE: Discharge to home after PACU  PATIENT DISPOSITION:  PACU - hemodynamically stable.   Delay start of Pharmacological VTE agent (>24hrs) due to surgical blood loss or risk of bleeding: no

## 2019-08-02 NOTE — Anesthesia Procedure Notes (Addendum)
Procedure Name: Intubation Date/Time: 08/02/2019 11:55 AM Performed by: Kyung Rudd, CRNA Pre-anesthesia Checklist: Patient identified, Emergency Drugs available, Suction available and Patient being monitored Patient Re-evaluated:Patient Re-evaluated prior to induction Oxygen Delivery Method: Circle system utilized Preoxygenation: Pre-oxygenation with 100% oxygen Induction Type: IV induction Ventilation: Mask ventilation without difficulty Laryngoscope Size: Mac and 3 Grade View: Grade I Tube type: Oral Tube size: 7.0 mm Number of attempts: 1 Airway Equipment and Method: Stylet Placement Confirmation: ETT inserted through vocal cords under direct vision,  positive ETCO2 and breath sounds checked- equal and bilateral Secured at: 21 cm Tube secured with: Tape Dental Injury: Teeth and Oropharynx as per pre-operative assessment  Comments: AOI by Dorene Grebe, SRNA with Dr. Marcie Bal supervising.

## 2019-08-02 NOTE — Anesthesia Procedure Notes (Signed)
Anesthesia Regional Block: Supraclavicular block   Pre-Anesthetic Checklist: ,, timeout performed, Correct Patient, Correct Site, Correct Laterality, Correct Procedure, Correct Position, site marked, Risks and benefits discussed,  Surgical consent,  Pre-op evaluation,  At surgeon's request and post-op pain management  Laterality: Left  Prep: chloraprep       Needles:  Injection technique: Single-shot  Needle Type: Echogenic Stimulator Needle     Needle Length: 5cm  Needle Gauge: 22     Additional Needles:   Procedures:, nerve stimulator,,,,,,,   Nerve Stimulator or Paresthesia:  Response: biceps flexion, 0.45 mA,   Additional Responses:   Narrative:  Start time: 08/02/2019 9:53 AM End time: 08/02/2019 10:01 AM Injection made incrementally with aspirations every 5 mL.  Performed by: Personally  Anesthesiologist: Albertha Ghee, MD  Additional Notes: Functioning IV was confirmed and monitors were applied.  A 83mm 22ga Arrow echogenic stimulator needle was used. Sterile prep and drape,hand hygiene and sterile gloves were used.  Negative aspiration and negative test dose prior to incremental administration of local anesthetic. The patient tolerated the procedure well.  Ultrasound guidance: relevent anatomy identified, needle position confirmed, local anesthetic spread visualized around nerve(s), vascular puncture avoided.  Image printed for medical record.

## 2019-08-02 NOTE — Transfer of Care (Signed)
Immediate Anesthesia Transfer of Care Note  Patient: Tonya [name] [facility]  Procedure(s) Performed: OPEN REDUCTION INTERNAL FIXATION (ORIF) LEFT ELBOW FRACTURE. (Left )  Patient Location: PACU  Anesthesia Type:GA combined with regional for post-op pain  Level of Consciousness: awake, alert  and oriented  Airway & Oxygen Therapy: Patient Spontanous Breathing and Patient connected to nasal cannula oxygen  Post-op Assessment: Report given to RN and Post -op Vital signs reviewed and stable  Post vital signs: Reviewed and stable  Last Vitals:  Vitals Value Taken Time  BP 146/76 08/02/19 1437  Temp    Pulse 82 08/02/19 1441  Resp 19 08/02/19 1441  SpO2 100 % 08/02/19 1441  Vitals shown include unvalidated device data.  Last Pain:  Vitals:   08/02/19 0839  TempSrc:   PainSc: 0-No pain      Patients Stated Pain Goal: 3 (82/80/03 4917)  Complications: No complications documented.

## 2019-08-02 NOTE — H&P (Addendum)
[facility] H&P/Consult     Patient ID: Tonya [name] MRN: [id number] DOB/AGE: 1950-10-01 69 y.o.  Chief Complaint: left elbow fracture HPI: Tonya [name] is an 69 y.o. female.with h/o rectal cancer, chronic diarrhea, fell three weeks ago and was sent out from ED without diagnosis of fracture. Subsequent follow up at Dr. Randel Pigg office identified fracture. Given the location and complexity of the trochlear sheer fracture and time from injury, Dr. Marlou Sa asserted this was outside his scope of practice and that it would be in the best interest of the patient to have these injuries evaluated and treated by a fellowship trained orthopaedic traumatologist. Consequently, I was consulted to provide further evaluation and management. I recommended repair and/ or excision to allow for improved motion and function. She has nearly completed course of Cipro for UTI.  Past Medical History:  Diagnosis Date  . Anemia    few yrs ago  . C. difficile diarrhea 03/2018  . Cancer ([place])    rectal  . Coronary artery disease 07/2003   occluded LAD wtih right-to-left collaterals 07/24/03; no current cardiologist  . Elbow fracture, left   . History of chemotherapy 01/2018   radaition last tx jan 2019  . Myocardial infarction [facility]) age 29's  . Obesity 2003   s/p gastric bypass   . Vitamin D deficiency   . Wears glasses     Past Surgical History:  Procedure Laterality Date  . ANAL RECTAL MANOMETRY N/A 11/01/2018   Procedure: ANO RECTAL MANOMETRY;  Surgeon: Leighton Ruff, MD;  Location: [facility];  Service: Endoscopy;  Laterality: N/A;  . CARDIAC CATHETERIZATION     prior to 2015  . CHOLECYSTECTOMY  1999  . GASTRIC BYPASS  2003  . IR FLUORO GUIDE PORT INSERTION RIGHT  01/25/2017  . IR REMOVAL TUN ACCESS W/ PORT W/O FL MOD SED  10/17/2018  . IR US GUIDE VASC ACCESS RIGHT  01/25/2017  . ostomy placed and removed  12/2017  . SACRAL NERVE STIMULATOR PLACEMENT   01-12-2019   DR Marcello Moores @[facility]    TEST PHASE    Family History  Problem Relation Age of Onset  . Heart attack Mother        d.93  . Throat cancer Mother 69  . Clotting disorder Mother   . Liver disease Mother   . Kidney disease Mother   . Heart attack Father        d.92  . Prostate cancer Father 81  . Uterine cancer Sister 18       treated with total hysterectomy and radiation  . Thyroid cancer Sister 59       treated with thyroidectomy  . Cancer Maternal Uncle        d.80s unspecified type of cancer. History of smoking.  Marland Kitchen Uterine cancer Sister 88  . Colon cancer Cousin 66       paternal first-cousin   Social History:  reports that she has never smoked. She has never used smokeless tobacco. She reports that she does not drink alcohol and does not use drugs.  Allergies:  Allergies  Allergen Reactions  . Bactrim [Sulfamethoxazole-Trimethoprim] Anaphylaxis, Hives and Other (See Comments)  . Phenergan [Promethazine Hcl]     Hives  . Sulfa Antibiotics     anaphylaxis  . Penicillins     REACTION: As a child.    Medications Prior to Admission  Medication Sig Dispense Refill  . acetaminophen (TYLENOL) 500 MG tablet Take 1,000 mg by  mouth every 8 (eight) hours as needed for moderate pain.    Marland Kitchen aspirin 81 MG chewable tablet Chew 81 mg by mouth daily.    . cephALEXin (KEFLEX) 500 MG capsule Take 1 capsule (500 mg total) by mouth 2 (two) times daily. 30 capsule 0  . Cholecalciferol (VITAMIN D3) 2000 units capsule Take 2,000 Units by mouth daily.     . ferrous sulfate (FERROUSUL) 325 (65 FE) MG tablet Take 1 tablet (325 mg total) by mouth daily with breakfast. 30 tablet 6  . Multiple Vitamin (MULTIVITAMIN) tablet Take 1 tablet by mouth daily.    . citalopram (CELEXA) 20 MG tablet TAKE 1 TABLET BY MOUTH EVERY DAY (Patient not taking: Reported on 07/31/2019) 30 tablet 2    Results for orders placed or performed during the hospital encounter of 08/02/19 (from the past 48 hour(s))   Urinalysis, Routine w reflex microscopic     Status: Abnormal   Collection Time: 08/02/19  7:57 AM  Result Value Ref Range   Color, Urine YELLOW YELLOW   APPearance HAZY (A) CLEAR   Specific Gravity, Urine 1.017 1.005 - 1.030   pH 5.0 5.0 - 8.0   Glucose, UA NEGATIVE NEGATIVE mg/dL   Hgb urine dipstick SMALL (A) NEGATIVE   Bilirubin Urine NEGATIVE NEGATIVE   Ketones, ur NEGATIVE NEGATIVE mg/dL   Protein, ur NEGATIVE NEGATIVE mg/dL   Nitrite NEGATIVE NEGATIVE   Leukocytes,Ua TRACE (A) NEGATIVE   RBC / HPF 0-5 0 - 5 RBC/hpf   WBC, UA 0-5 0 - 5 WBC/hpf   Bacteria, UA RARE (A) NONE SEEN   Squamous Epithelial / LPF 0-5 0 - 5   Mucus PRESENT    Non Squamous Epithelial 0-5 (A) NONE SEEN    Comment: Performed at [facility], [address], [place], [place] [address]  CBC WITH DIFFERENTIAL     Status: Abnormal   Collection Time: 08/02/19  8:34 AM  Result Value Ref Range   WBC 6.1 4.0 - 10.5 K/uL   RBC 3.51 (L) 3.87 - 5.11 MIL/uL   Hemoglobin 10.4 (L) 12.0 - 15.0 g/dL   HCT 34.7 (L) 36 - 46 %   MCV 98.9 80.0 - 100.0 fL   MCH 29.6 26.0 - 34.0 pg   MCHC 30.0 30.0 - 36.0 g/dL   RDW 13.8 11.5 - 15.5 %   Platelets 381 150 - 400 K/uL   nRBC 0.0 0.0 - 0.2 %   Neutrophils Relative % 80 %   Neutro Abs 5.0 1.7 - 7.7 K/uL   Lymphocytes Relative 9 %   Lymphs Abs 0.5 (L) 0.7 - 4.0 K/uL   Monocytes Relative 8 %   Monocytes Absolute 0.5 0 - 1 K/uL   Eosinophils Relative 1 %   Eosinophils Absolute 0.1 0 - 0 K/uL   Basophils Relative 1 %   Basophils Absolute 0.0 0 - 0 K/uL   Immature Granulocytes 1 %   Abs Immature Granulocytes 0.04 0.00 - 0.07 K/uL    Comment: Performed at [facility], [address], [place], [place] [address]  Comprehensive metabolic panel     Status: Abnormal   Collection Time: 08/02/19  8:34 AM  Result Value Ref Range   Sodium 140 135 - 145 mmol/L   Potassium 3.8 3.5 - 5.1 mmol/L   Chloride 105 98 - 111 mmol/L   CO2 25 22 - 32 mmol/L   Glucose, Bld  103 (H) 70 - 99 mg/dL  Comment: Glucose reference range applies only to samples taken after fasting for at least 8 hours.   BUN 12 8 - 23 mg/dL   Creatinine, Ser 0.77 0.44 - 1.00 mg/dL   Calcium 9.1 8.9 - 10.3 mg/dL   Total Protein 6.1 (L) 6.5 - 8.1 g/dL   Albumin 3.1 (L) 3.5 - 5.0 g/dL   AST 16 15 - 41 U/L   ALT 16 0 - 44 U/L   Alkaline Phosphatase 60 38 - 126 U/L   Total Bilirubin 0.4 0.3 - 1.2 mg/dL   GFR calc non Af Amer >60 >60 mL/min   GFR calc Af Amer >60 >60 mL/min   Anion gap 10 5 - 15    Comment: Performed at [facility], [place] [address], [place], [place] [address]  Protime-INR     Status: None   Collection Time: 08/02/19  8:34 AM  Result Value Ref Range   Prothrombin Time 13.0 11.4 - 15.2 seconds   INR 1.0 0.8 - 1.2    Comment: (NOTE) INR goal varies based on device and disease states. Performed at [facility], [place] [address] [place], [place] [address]   APTT     Status: None   Collection Time: 08/02/19  8:34 AM  Result Value Ref Range   aPTT 29 24 - 36 seconds    Comment: Performed at [facility] [address], [place], [place] [address]  VITAMIN D 25 Hydroxy (Vit-D Deficiency, Fractures)     Status: None   Collection Time: 08/02/19  8:34 AM  Result Value Ref Range   Vit D, 25-Hydroxy 41.30 30 - 100 ng/mL    Comment: (NOTE) Vitamin D deficiency has been defined by the [employer] practice guideline as a level of serum 25-OH  vitamin D less than 20 ng/mL (1,2). The Endocrine Society went on to  further define vitamin D insufficiency as a level between 21 and 29  ng/mL (2).  1. IOM (Institute of Medicine). 2010. Dietary reference intakes for  calcium and D. [place]: The [employer]. 2. Holick MF, Binkley , Bischoff-Ferrari HA, et al. Evaluation,  treatment, and prevention of vitamin D deficiency: an Endocrine  Society clinical practice guideline, JCEM. 2011 Jul; 96(7):  1911-30.  Performed at [facility], [place] [address] [place],  [address]    CT ELBOW LEFT WO CONTRAST  Result Date: 07/31/2019 CLINICAL DATA:  Evaluate complex elbow fractures. EXAM: CT OF THE UPPER LEFT EXTREMITY WITHOUT CONTRAST TECHNIQUE: Multidetector CT imaging of the upper left extremity was performed according to the standard protocol. COMPARISON:  Radiographs 07/30/2019 FINDINGS: Complex comminuted transchondral and intra-articular of the distal humerus. This is largely an oblique split type fracture through the medial and lateral condyles of the humerus. The capitellum is displaced, comminuted and rotated slightly radially. The intracondylar portion of the is displaced into the upper anterior joint space. The fracture fragment measures 14 mm and is displaced superiorly approximately 12 mm. I suspect there is a subtle impaction type fracture involving the radial head and neck. The olecranon and coronoid process of the ulna are intact. No dislocation. Associated large joint effusion. IMPRESSION: 1. Complex comminuted transchondral and intra-articular fracture of the distal humerus as described above. 2. Possible subtle impaction type fracture involving the radial head and neck. 3. Associated large joint effusion. Electronically Signed   By: Marijo Sanes M.D.   On: 07/31/2019 15:00  DG Chest Portable 1 View  Result Date: 08/02/2019 CLINICAL DATA:  Provided history: Preoperative examination. Preoperative examination for left elbow fracture. EXAM: PORTABLE CHEST 1 VIEW COMPARISON:  Chest CT 10/02/2018, chest radiograph 05/06/2013 FINDINGS: Heart size within normal limits. No appreciable airspace consolidation or pulmonary edema. No evidence of pleural effusion or pneumothorax. No acute bony abnormality is identified. IMPRESSION: No evidence of active cardiopulmonary disease. Electronically Signed   By: Kellie Simmering DO   On: 08/02/2019 08:17    ROS No recent fever, bleeding  abnormalities, or weight gain. Urology and GI as above   Blood pressure (!) 140/57, pulse 72, temperature 97.6 F (36.4 C), temperature source Temporal, resp. rate 18, height 5\' 1"  (1.549 m), weight 79.4 kg, SpO2 100 %. Physical Exam  Left periorbital ecchymosis RRR Sat 98% LUEx   REGIONAL BLOCK HAS BEEN PLACED  Sens  Ax/R/M/U absent  Mot   Ax/ R/ PIN/ M/ AIN/ U absent  Brisk CR, Rad 2+, ecchymosis of elbow    Assessment/Plan  Displaced left, nondominant trochlea fracture of the distal humerus  I discussed with the patient the risks and benefits of surgery, including the possibility of infection, nerve injury, vessel injury, wound breakdown, arthritis, symptomatic hardware, DVT/ PE, loss of motion, malunion, nonunion, and need for further surgery among others.  We also specifically discussed the possible need to excise one or both fractures if they lack sufficient integrity to undergo repair. She acknowledged these risks and wished to proceed.   Altamese Moravia, MD [facility], [facility] [phone]   08/02/2019, 11:16 AM  [facility] [place] [address] [phone] (F)

## 2019-08-03 ENCOUNTER — Encounter (HOSPITAL_COMMUNITY): Payer: Self-pay | Admitting: Orthopedic Surgery

## 2019-08-04 NOTE — Op Note (Signed)
NAME: Tonya [name], Tonya [name] [facility] MEDICAL RECORD PY:[id number] ACCOUNT [id number] DATE OF BIRTH:1950/02/03 FACILITY: [facility] LOCATION: [facility] PHYSICIAN:Takerra Lupinacci H. Ghazi Rumpf, MD  OPERATIVE REPORT  DATE OF PROCEDURE:  08/02/2019  PREOPERATIVE DIAGNOSIS:  Left distal humerus trochlear shear fracture.  POSTOPERATIVE DIAGNOSIS:  Left distal humerus trochlear shear fracture.  PROCEDURE:  Open reduction internal fixation of left elbow trochlear shear fracture.  SURGEON:  Altamese Show Low, MD  ASSISTANT:  Ainsley Spinner, PA-C.  ANESTHESIA:  General.  TOURNIQUET:  None.  ESTIMATED BLOOD LOSS:  Less than 25 mL.    TOTAL TOURNIQUET TIME:  107 minutes.  DISPOSITION:  To PACU.  CONDITION:  Stable.  BRIEF SUMMARY FOR PROCEDURE:  The patient is a very pleasant 69 year old female who sustained a ground level fall approximately 3 weeks ago.  She was initially seen and evaluated in the Emergency Department where fracture was not initially identified.   Upon followup to Dr. Randel Pigg office at [facility], the diagnosis was made and because of the complexity of the fracture pattern in addition to the time from injury, Dr. Marlou Sa asserted this was outside his scope of practice and would be best managed by  fellowship trained orthopedic traumatologist.  Consequently, she was referred to me and was kind enough to meet me in the preoperative holding area for full discussion and evaluation in anticipation of necessary surgery.  I did discuss with her the risks  and benefits of surgical repair, including the very real possibility of failure to obtain a stable repair and the need for simple excision of one or both fragments involving the capitellum and trochlea.  Risks included loss of motion, arthritis, nonunion, malunion and again need for further surgery.  After acknowledging these risks, she did provide consent to proceed.  BRIEF SUMMARY OF PROCEDURE:  The patient was taken to the operating room where general anesthesia was  induced.  The left upper extremity underwent a chlorhexidine wash, Betadine scrub and paint.  A tourniquet was placed about the upper arm.  The arm was  elevated, exsanguinated with an Esmarch bandage and then a lateral approach was made to the distal humerus.  Hemostasis was obtained with electrocautery and the joint capsule reflected anteriorly along the Kocher approach, making the distal extent with  the forearm pronated to protect the posterior interosseous nerve.  This allowed me visualization into the joint where the capitellar piece as well as the trochlear piece had both migrated significantly proximally and were encased in callus that had formed.  This was  carefully teased apart with a Valora Corporal and then the fragments cleaned of hematoma with a curette.  They were pieced together outside the body using two 0.45 K-wires to prevent rotation.  The fracture bed was prepared in similar fashion with the lavage and  curettage.  The fragments were then carefully inserted into the fracture bed, reduced and held transiently with a carefully placed sharp-sharp tenaculum, being careful not to fragment the small fracture components or to displace them from one another  across the trochlear spool.  I then used 2 K-wires to pin this reduction from anterior to posterior and placed two 22 micro Acumed Acutrak screws, getting excellent purchase.  Prior to placing, of course I checked the reduction on both AP and lateral  images with C-arm.  A K-wire, which was inserted from lateral to medial and used to obtain that provisional fixation was left in place as it was the only fixation that was achievable within the smaller trochlear fragment.  It was noted that  there was  slight articular displacement of the distal tip of the far medial side of the trochlea at its articulation with the ulna.  This could not be visualized through the current exposure and the fragment would require a separate complete medial approach, which  would further exacerbate her  loss of motion and potential for heterotopic ossification.  After careful deliberation, therefore, it was not separately approached and fixed and did fortunately appear to remain somewhat secured by the K-wire as it passed through the intact medial  trochlea into this fragment as well.  The arm was kept carefully at 90 degrees during irrigation and a layered closure with 2-0 FiberWire for the annular ligament and joint and then 0 Vicryl, 2-0 Vicryl and 2-0 nylon.  Sterile gently compressive dressing  and splint was applied.  The tourniquet was deflated.  Some sanguineous discharge was noted at that time, but it appeared to resolve as the compression layer of the dressing was applied.  Ainsley Spinner, PA-C, was present and assisting throughout and his  assistance was necessary in this very delicate fracture repaired at a subacute timeframe.  PROGNOSIS:  The patient remains at significantly elevated risk for loss of motion, which is certain and just a question of magnitude, but also nonunion or malunion if the fragment should migrate.  We will plan to see her back in the office for removal of  her splint in 2-1/2 weeks, with very careful and slowly graduate of motion from that point forward and no restrictions anticipated after 6 weeks.  VN/NUANCE  D:08/04/2019 T:08/04/2019 JOB:[id number]

## 2019-08-08 ENCOUNTER — Ambulatory Visit: Payer: Medicare HMO | Admitting: Family Medicine

## 2019-08-13 NOTE — Anesthesia Postprocedure Evaluation (Signed)
Anesthesia Post Note  Patient: [facility]  Procedure(s) Performed: OPEN REDUCTION INTERNAL FIXATION (ORIF) LEFT ELBOW FRACTURE. (Left )     Patient location during evaluation: PACU Anesthesia Type: General and Regional Level of consciousness: awake and alert Pain management: pain level controlled Vital Signs Assessment: post-procedure vital signs reviewed and stable Respiratory status: spontaneous breathing, nonlabored ventilation, respiratory function stable and patient connected to nasal cannula oxygen Cardiovascular status: blood pressure returned to baseline and stable Postop Assessment: no apparent nausea or vomiting Anesthetic complications: no   No complications documented.  Last Vitals:  Vitals:   08/02/19 1507 08/02/19 1533  BP: (!) 139/75 (!) 133/95  Pulse: 77 73  Resp: 19 19  Temp:  36.7 C  SpO2: 100% 100%    Last Pain:  Vitals:   08/02/19 1533  TempSrc:   PainSc: 0-No pain                 Ivy Meriwether S

## 2019-08-15 ENCOUNTER — Encounter: Payer: Self-pay | Admitting: Family Medicine

## 2019-08-15 MED ORDER — CEPHALEXIN 500 MG PO CAPS: 500.0000 mg | ORAL_CAPSULE | Freq: Two times a day (BID) | ORAL | 0 refills | Status: DC

## 2019-08-15 NOTE — Telephone Encounter (Signed)
Ok to send in refill of the Keflex for UTI symptoms. She should schedule a visit when she returns to town if she is still having symptoms or her symptoms resolve and return in the next few weeks. If she gets worse on the antibiotic she should go to an urgent care near her location.

## 2019-08-22 DIAGNOSIS — S42472D Displaced transcondylar fracture of left humerus, subsequent encounter for fracture with routine healing: Secondary | ICD-10-CM | POA: Diagnosis not present

## 2019-08-28 ENCOUNTER — Encounter: Payer: Self-pay | Admitting: Orthopedic Surgery

## 2019-09-04 NOTE — Progress Notes (Signed)
Tonya [name] is a 69 y.o. female who presents for annual wellness visit and to follow-up on chronic medical conditions.  She has the following concerns:  States her life is miserable due to her rectal cancer and fecal incontinence. States she cannot travel or do anything she enjoys other than in her home. She only goes out to her medical appointments. States she has seen at least 42 providers and tried every type of treatment for fecal incontinence and nothing has helped.   Denies any SI. Has been involved in counseling and does not want to do this again.   She was taking Celexa and stopped. States she does not want to take it, it was not helping.   She would like to fill out a DNR. She does not want her life to be prolonged if something happens to her.   States if she had not been gaining weight, she would think her cancer was back.   Osteoporosis- States she took an oral bisoph for 5 years and her PCP stopped it approximately 5-6 years ago.  Needs DEXA  Recent fall with left arm fracture. She is wearing a sling today.  States she was mad and stepped off a curb not paying attention and landed on her arm.  States she does not have concerns about falling.   Hx of diabetes prior to gastric bypass. States it resolved after weight loss.   Eyes- 6 months ago   States she would not "do anything" about any cancer findings so she is not sure she needs to do cancer screenings any longer.    Immunization History  Administered Date(s) Administered   Fluad Quad(high Dose 65+) 10/02/2018   Influenza Inj Mdck Quad With Preservative 01/21/2017   PFIZER SARS-COV-2 Vaccination 02/08/2019, 03/05/2019   Pneumococcal Polysaccharide-23 06/16/2017, 09/05/2019   Tdap 07/14/2019   Last Pap smear: years ago  Last mammogram: years Last colonoscopy: followed closely by GI. 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 Marlou Sa, Dr. Marcelino Scot- Ortho Dr. Burr Medico- oncologist  Dr. Marcello Moores- GI  Depression screen:  See questionnaire below.  Depression screen [facility] 2/9 09/05/2019 04/20/2019 04/20/2019 11/01/2016 08/09/2016  Decreased Interest 0 0 3 0 0  Down, Depressed, Hopeless 0 3 3 0 0  PHQ - 2 Score 0 3 6 0 0  Altered sleeping - 0 0 - -  Tired, decreased energy - 1 1 - -  Change in appetite - 0 3 - -  Feeling bad or failure about yourself  - 0 0 - -  Trouble concentrating - 0 0 - -  Moving slowly or fidgety/restless - 0 0 - -  Suicidal thoughts - 0 0 - -  PHQ-9 Score - 4 10 - -  Difficult doing work/chores - Not difficult at all Not difficult at all - -  Some recent data might be hidden    Fall Risk Screen: see questionnaire below. Fall Risk  09/05/2019 04/20/2019 11/01/2016 07/28/2016 07/28/2016  Falls in the past year? 1 0 No No No  Number falls in past yr: 1 0 - - -  Injury with Fall? 1 0 - - -    ADL screen:  See questionnaire below Functional Status Survey: Is the patient deaf or have difficulty hearing?: No Does the patient have difficulty seeing, even when wearing glasses/contacts?: No Does the patient have difficulty concentrating, remembering, or making decisions?: No  Does the patient have difficulty walking or climbing stairs?: No Does the patient have difficulty dressing or bathing?: Yes (in an arm brace) Does the patient have difficulty doing errands alone such as visiting a doctor's office or shopping?: No   End of Life Discussion:  Patient has a living will and medical power of attorney. Tonya [name] is her HCPOA.   Review of Systems Constitutional: -fever, -chills, -sweats, -unexpected weight change, -anorexia, -fatigue Allergy: -sneezing, -itching, -congestion Dermatology: denies changing moles, rash, lumps, new worrisome lesions ENT: -runny nose, -ear pain, -sore throat, -hoarseness, -sinus pain, -teeth pain, -tinnitus, -hearing loss, -epistaxis Cardiology:   -chest pain, -palpitations, -edema, -orthopnea, -paroxysmal nocturnal dyspnea Respiratory: -cough, -shortness of breath, -dyspnea on exertion, -wheezing, -hemoptysis Gastroenterology: -abdominal pain, -nausea, -vomiting, +diarrhea, -constipation, -blood in stool, -changes in bowel movement, -dysphagia Hematology: -bleeding or bruising problems Musculoskeletal: -arthralgias, -myalgias, -joint swelling, -back pain, -neck pain, -cramping, -gait changes Ophthalmology: -vision changes, -eye redness, -itching, -discharge Urology: -dysuria, -difficulty urinating, -hematuria, -urinary frequency, -urgency, -incontinence Neurology: -headache, -weakness, -tingling, -numbness, -speech abnormality, -memory loss, +falls, -dizziness Psychology:  +depressed mood, -agitation, -sleep problems    PHYSICAL EXAM:  BP 122/82    Pulse 72    Ht 5' 1.25" (1.556 m)    Wt 179 lb (81.2 kg)    BMI 33.55 kg/m   General Appearance: Alert, cooperative, no distress, appears stated age Head: Normocephalic, without obvious abnormality, atraumatic Eyes: PERRL, conjunctiva/corneas clear, EOM's intact Nose: mask on  Throat: mask on  Neck: Supple Back: normal motion  Lungs: Clear to auscultation bilaterally without wheezes, rales or ronchi; respirations unlabored Chest Wall: No tenderness or deformity Heart: Regular rate and rhythm, S1 and S2 normal, no murmur, rub or gallop Extremities: No clubbing, cyanosis or edema Pulses: 2+ and symmetric all extremities Skin: Skin warm and dry Lymph nodes: Cervical, supraclavicular Neurologic: CNII-XII intact, normal strength, sensation and gait Psych: depressed mood, flat affect, normal hygiene and grooming.  ASSESSMENT/PLAN: Medicare annual wellness visit, subsequent -She is here today for Medicare wellness visit.  Declines physical exam due to recent arm fracture and discomfort.  Preventive health care was reviewed.  Counseling on healthy lifestyle including diet and activity  level.  Immunizations reviewed.  She may get her Shingrix vaccine at her local pharmacy.  Advanced directives discussed in depth  Osteoporosis, unspecified osteoporosis type, unspecified pathological fracture presence - Plan: DG Bone Density -reports taking oral bisphosphonate for 5 years and then stopped it as recommended.  Recent fall with fracture.  I will order a bone density she is aware that she needs to call and schedule this.  Discussed that we may need to start her back on treatment  Depression, major, single episode, moderate ([place]) -declines medication and therapy. Denies SI  Fatigue, unspecified type - Plan: CBC with Differential/Platelet, Comprehensive metabolic panel, T4, free, Vitamin B12, TSH -discussed possible etiologies. check labs and follow up.   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 Gastric bypass reversed.   Advance directive discussed with patient -she requests to be a DNR. We discussed what this means and I signed this form for her. She took the original and is aware to have this with her at all times. Aware she may change her mind at any point   Immunization counseling -UTD Tdap, Covid and first  pneumonia vaccine   Need for vaccination against Streptococcus pneumoniae - Plan: Pneumococcal polysaccharide vaccine 23-valent greater than or equal to 2yo subcutaneous/IM  Screening for lipid disorders - Plan: Lipid panel -follow up pending results. She had sugar and cream in coffee   History of iron deficiency anemia - Plan: CBC with Differential/Platelet, Iron, TIBC and Ferritin Panel -follow up pending results   DNR no code (do not resuscitate)     Discussed monthly self breast exams and yearly mammograms; at least 30 minutes of aerobic  activity at least 5 days/week and weight-bearing exercise 2x/week; proper sunscreen use reviewed; healthy diet, including goals of calcium and vitamin D intake and alcohol recommendations (less than or equal to 1 drink/day) reviewed; regular seatbelt use; changing batteries in smoke detectors.  Immunization recommendations discussed.  Colonoscopy recommendations reviewed   Medicare Attestation I have personally reviewed: The patient's medical and social history Their use of alcohol, tobacco or illicit drugs Their current medications and supplements The patient's functional ability including ADLs,fall risks, home safety risks, cognitive, and hearing and visual impairment Diet and physical activities Evidence for depression or mood disorders  The patient's weight, height, and BMI have been recorded in the chart.  I have made referrals, counseling, and provided education to the patient based on review of the above and I have provided the patient with a written personalized care plan for preventive services.     Harland Dingwall, NP-C   09/05/2019

## 2019-09-05 ENCOUNTER — Ambulatory Visit (INDEPENDENT_AMBULATORY_CARE_PROVIDER_SITE_OTHER): Payer: Medicare HMO | Admitting: Family Medicine

## 2019-09-05 ENCOUNTER — Other Ambulatory Visit: Payer: Self-pay

## 2019-09-05 VITALS — BP 122/82 | HR 72 | Ht 61.25 in | Wt 179.0 lb

## 2019-09-05 DIAGNOSIS — Z862 Personal history of diseases of the blood and blood-forming organs and certain disorders involving the immune mechanism: Secondary | ICD-10-CM

## 2019-09-05 DIAGNOSIS — E119 Type 2 diabetes mellitus without complications: Secondary | ICD-10-CM | POA: Diagnosis not present

## 2019-09-05 DIAGNOSIS — Z13220 Encounter for screening for lipoid disorders: Secondary | ICD-10-CM

## 2019-09-05 DIAGNOSIS — M810 Age-related osteoporosis without current pathological fracture: Secondary | ICD-10-CM

## 2019-09-05 DIAGNOSIS — Z66 Do not resuscitate: Secondary | ICD-10-CM | POA: Diagnosis not present

## 2019-09-05 DIAGNOSIS — R5383 Other fatigue: Secondary | ICD-10-CM | POA: Diagnosis not present

## 2019-09-05 DIAGNOSIS — Z23 Encounter for immunization: Secondary | ICD-10-CM | POA: Diagnosis not present

## 2019-09-05 DIAGNOSIS — E669 Obesity, unspecified: Secondary | ICD-10-CM | POA: Diagnosis not present

## 2019-09-05 DIAGNOSIS — Z0000 Encounter for general adult medical examination without abnormal findings: Secondary | ICD-10-CM | POA: Diagnosis not present

## 2019-09-05 DIAGNOSIS — F321 Major depressive disorder, single episode, moderate: Secondary | ICD-10-CM

## 2019-09-05 DIAGNOSIS — Z7189 Other specified counseling: Secondary | ICD-10-CM

## 2019-09-05 DIAGNOSIS — Z7185 Encounter for immunization safety counseling: Secondary | ICD-10-CM

## 2019-09-05 DIAGNOSIS — S42472D Displaced transcondylar fracture of left humerus, subsequent encounter for fracture with routine healing: Secondary | ICD-10-CM | POA: Diagnosis not present

## 2019-09-05 DIAGNOSIS — R69 Illness, unspecified: Secondary | ICD-10-CM | POA: Diagnosis not present

## 2019-09-05 NOTE — Patient Instructions (Signed)
You can call and schedule your bone density as discussed.   Check with your pharmacist and insurance carrier regarding the Shingrix vaccine.    Tonya [name] , Thank you for taking time to come for your Medicare Wellness Visit. I appreciate your ongoing commitment to your health goals. Please review the following plan we discussed and let me know if I can assist you in the future.   This is a list of the screening recommended for you and due dates:  Health Maintenance  Topic Date Due  .  Hepatitis C: One time screening is recommended by Center for Disease Control  (CDC) for  adults born from [birth year] through 1963.   Never done  . Complete foot exam   Never done  . Eye exam for diabetics  Never done  . Urine Protein Check  Never done  . Hemoglobin A1C  11/02/2013  . Mammogram  09/22/2016  . Colon Cancer Screening  07/09/2017  . Pneumonia vaccines (2 of 2 - PCV13) 06/17/2018  . Flu Shot  08/05/2019  . Tetanus Vaccine  07/13/2029  . DEXA scan (bone density measurement)  Completed  . COVID-19 Vaccine  Completed

## 2019-09-06 ENCOUNTER — Other Ambulatory Visit: Payer: Self-pay | Admitting: Family Medicine

## 2019-09-06 DIAGNOSIS — Z1231 Encounter for screening mammogram for malignant neoplasm of breast: Secondary | ICD-10-CM

## 2019-09-06 LAB — COMPREHENSIVE METABOLIC PANEL
ALT: 16 IU/L (ref 0–32)
AST: 19 IU/L (ref 0–40)
Albumin/Globulin Ratio: 1.8 (ref 1.2–2.2)
Albumin: 4.2 g/dL (ref 3.8–4.8)
Alkaline Phosphatase: 68 IU/L (ref 48–121)
BUN/Creatinine Ratio: 28 (ref 12–28)
BUN: 19 mg/dL (ref 8–27)
Bilirubin Total: 0.2 mg/dL (ref 0.0–1.2)
CO2: 24 mmol/L (ref 20–29)
Calcium: 9.5 mg/dL (ref 8.7–10.3)
Chloride: 103 mmol/L (ref 96–106)
Creatinine, Ser: 0.68 mg/dL (ref 0.57–1.00)
GFR calc Af Amer: 105 mL/min/{1.73_m2} (ref >59)
GFR calc non Af Amer: 91 mL/min/{1.73_m2} (ref >59)
Globulin, Total: 2.4 g/dL (ref 1.5–4.5)
Glucose: 93 mg/dL (ref 65–99)
Potassium: 4.4 mmol/L (ref 3.5–5.2)
Sodium: 141 mmol/L (ref 134–144)
Total Protein: 6.6 g/dL (ref 6.0–8.5)

## 2019-09-06 LAB — VITAMIN B12: Vitamin B-12: 655 pg/mL (ref 232–1245)

## 2019-09-06 LAB — CBC WITH DIFFERENTIAL/PLATELET
Basophils Absolute: 0 10*3/uL (ref 0.0–0.2)
Basos: 1 %
EOS (ABSOLUTE): 0.1 10*3/uL (ref 0.0–0.4)
Eos: 2 %
Hematocrit: 36 % (ref 34.0–46.6)
Hemoglobin: 12 g/dL (ref 11.1–15.9)
Immature Grans (Abs): 0 10*3/uL (ref 0.0–0.1)
Immature Granulocytes: 0 %
Lymphocytes Absolute: 0.6 10*3/uL — ABNORMAL LOW (ref 0.7–3.1)
Lymphs: 10 %
MCH: 31.3 pg (ref 26.6–33.0)
MCHC: 33.3 g/dL (ref 31.5–35.7)
MCV: 94 fL (ref 79–97)
Monocytes Absolute: 0.5 10*3/uL (ref 0.1–0.9)
Monocytes: 8 %
Neutrophils Absolute: 5.3 10*3/uL (ref 1.4–7.0)
Neutrophils: 79 %
Platelets: 239 10*3/uL (ref 150–450)
RBC: 3.84 x10E6/uL (ref 3.77–5.28)
RDW: 14.2 % (ref 11.7–15.4)
WBC: 6.6 10*3/uL (ref 3.4–10.8)

## 2019-09-06 LAB — LIPID PANEL
Chol/HDL Ratio: 2.8 ratio (ref 0.0–4.4)
Cholesterol, Total: 188 mg/dL (ref 100–199)
HDL: 67 mg/dL (ref >39)
LDL Chol Calc (NIH): 96 mg/dL (ref 0–99)
Triglycerides: 147 mg/dL (ref 0–149)
VLDL Cholesterol Cal: 25 mg/dL (ref 5–40)

## 2019-09-06 LAB — IRON,TIBC AND FERRITIN PANEL
Ferritin: 185 ng/mL — ABNORMAL HIGH (ref 15–150)
Iron Saturation: 22 % (ref 15–55)
Iron: 65 ug/dL (ref 27–139)
Total Iron Binding Capacity: 295 ug/dL (ref 250–450)
UIBC: 230 ug/dL (ref 118–369)

## 2019-09-06 LAB — TSH: TSH: 2.54 u[IU]/mL (ref 0.450–4.500)

## 2019-09-06 LAB — HEMOGLOBIN A1C
Est. average glucose Bld gHb Est-mCnc: 105 mg/dL
Hgb A1c MFr Bld: 5.3 % (ref 4.8–5.6)

## 2019-09-06 LAB — T4, FREE: Free T4: 1.36 ng/dL (ref 0.82–1.77)

## 2019-09-25 DIAGNOSIS — M25612 Stiffness of left shoulder, not elsewhere classified: Secondary | ICD-10-CM | POA: Diagnosis not present

## 2019-09-25 DIAGNOSIS — M25632 Stiffness of left wrist, not elsewhere classified: Secondary | ICD-10-CM | POA: Diagnosis not present

## 2019-09-25 DIAGNOSIS — M25642 Stiffness of left hand, not elsewhere classified: Secondary | ICD-10-CM | POA: Diagnosis not present

## 2019-09-25 DIAGNOSIS — M25512 Pain in left shoulder: Secondary | ICD-10-CM | POA: Diagnosis not present

## 2019-09-25 DIAGNOSIS — M25522 Pain in left elbow: Secondary | ICD-10-CM | POA: Diagnosis not present

## 2019-09-25 DIAGNOSIS — S42402S Unspecified fracture of lower end of left humerus, sequela: Secondary | ICD-10-CM | POA: Diagnosis not present

## 2019-09-25 DIAGNOSIS — M25622 Stiffness of left elbow, not elsewhere classified: Secondary | ICD-10-CM | POA: Diagnosis not present

## 2019-09-27 DIAGNOSIS — M25512 Pain in left shoulder: Secondary | ICD-10-CM | POA: Diagnosis not present

## 2019-09-27 DIAGNOSIS — M25642 Stiffness of left hand, not elsewhere classified: Secondary | ICD-10-CM | POA: Diagnosis not present

## 2019-09-27 DIAGNOSIS — M25612 Stiffness of left shoulder, not elsewhere classified: Secondary | ICD-10-CM | POA: Diagnosis not present

## 2019-09-27 DIAGNOSIS — M25632 Stiffness of left wrist, not elsewhere classified: Secondary | ICD-10-CM | POA: Diagnosis not present

## 2019-09-27 DIAGNOSIS — S42402S Unspecified fracture of lower end of left humerus, sequela: Secondary | ICD-10-CM | POA: Diagnosis not present

## 2019-09-27 DIAGNOSIS — M25622 Stiffness of left elbow, not elsewhere classified: Secondary | ICD-10-CM | POA: Diagnosis not present

## 2019-09-27 DIAGNOSIS — M25522 Pain in left elbow: Secondary | ICD-10-CM | POA: Diagnosis not present

## 2019-10-02 ENCOUNTER — Inpatient Hospital Stay: Payer: Medicare HMO | Attending: Hematology

## 2019-10-02 ENCOUNTER — Ambulatory Visit (HOSPITAL_COMMUNITY)
Admission: RE | Admit: 2019-10-02 | Discharge: 2019-10-02 | Disposition: A | Discharge status: Home / Self Care | Payer: Medicare HMO | Source: Ambulatory Visit | Attending: Hematology | Admitting: Hematology

## 2019-10-02 ENCOUNTER — Other Ambulatory Visit: Payer: Self-pay

## 2019-10-02 ENCOUNTER — Encounter (HOSPITAL_COMMUNITY): Payer: Self-pay

## 2019-10-02 DIAGNOSIS — C20 Malignant neoplasm of rectum: Secondary | ICD-10-CM | POA: Diagnosis not present

## 2019-10-02 DIAGNOSIS — I700 Atherosclerosis of aorta: Secondary | ICD-10-CM | POA: Diagnosis not present

## 2019-10-02 DIAGNOSIS — Q630 Accessory kidney: Secondary | ICD-10-CM | POA: Diagnosis not present

## 2019-10-02 DIAGNOSIS — Z9049 Acquired absence of other specified parts of digestive tract: Secondary | ICD-10-CM | POA: Diagnosis not present

## 2019-10-02 DIAGNOSIS — C189 Malignant neoplasm of colon, unspecified: Secondary | ICD-10-CM | POA: Diagnosis not present

## 2019-10-02 DIAGNOSIS — N200 Calculus of kidney: Secondary | ICD-10-CM | POA: Diagnosis not present

## 2019-10-02 DIAGNOSIS — I2510 Atherosclerotic heart disease of native coronary artery without angina pectoris: Secondary | ICD-10-CM | POA: Diagnosis not present

## 2019-10-02 DIAGNOSIS — D500 Iron deficiency anemia secondary to blood loss (chronic): Secondary | ICD-10-CM

## 2019-10-02 HISTORY — DX: Heart failure, unspecified: I50.9

## 2019-10-02 HISTORY — DX: Type 2 diabetes mellitus without complications: E11.9

## 2019-10-02 LAB — CMP (CANCER CENTER ONLY)
ALT: 22 U/L (ref 0–44)
AST: 20 U/L (ref 15–41)
Albumin: 3.6 g/dL (ref 3.5–5.0)
Alkaline Phosphatase: 67 U/L (ref 38–126)
Anion gap: 6 (ref 5–15)
BUN: 17 mg/dL (ref 8–23)
CO2: 28 mmol/L (ref 22–32)
Calcium: 9.7 mg/dL (ref 8.9–10.3)
Chloride: 105 mmol/L (ref 98–111)
Creatinine: 0.85 mg/dL (ref 0.44–1.00)
GFR, Est AFR Am: >60 mL/min (ref >60)
GFR, Estimated: >60 mL/min (ref >60)
Glucose, Bld: 92 mg/dL (ref 70–99)
Potassium: 4.1 mmol/L (ref 3.5–5.1)
Sodium: 139 mmol/L (ref 135–145)
Total Bilirubin: 0.3 mg/dL (ref 0.3–1.2)
Total Protein: 7.3 g/dL (ref 6.5–8.1)

## 2019-10-02 LAB — CBC WITH DIFFERENTIAL/PLATELET
Abs Immature Granulocytes: 0.05 10*3/uL (ref 0.00–0.07)
Basophils Absolute: 0 10*3/uL (ref 0.0–0.1)
Basophils Relative: 0 %
Eosinophils Absolute: 0.1 10*3/uL (ref 0.0–0.5)
Eosinophils Relative: 2 %
HCT: 37.6 % (ref 36.0–46.0)
Hemoglobin: 12.2 g/dL (ref 12.0–15.0)
Immature Granulocytes: 1 %
Lymphocytes Relative: 9 %
Lymphs Abs: 0.7 10*3/uL (ref 0.7–4.0)
MCH: 30.8 pg (ref 26.0–34.0)
MCHC: 32.4 g/dL (ref 30.0–36.0)
MCV: 94.9 fL (ref 80.0–100.0)
Monocytes Absolute: 0.6 10*3/uL (ref 0.1–1.0)
Monocytes Relative: 8 %
Neutro Abs: 5.6 10*3/uL (ref 1.7–7.7)
Neutrophils Relative %: 80 %
Platelets: 247 10*3/uL (ref 150–400)
RBC: 3.96 MIL/uL (ref 3.87–5.11)
RDW: 12.7 % (ref 11.5–15.5)
WBC: 7.1 10*3/uL (ref 4.0–10.5)
nRBC: 0 % (ref 0.0–0.2)

## 2019-10-02 LAB — CEA (IN HOUSE-CHCC): CEA (CHCC-In House): 1.57 ng/mL (ref 0.00–5.00)

## 2019-10-02 MED ORDER — IOHEXOL 300 MG/ML  SOLN
100.0000 mL | Freq: Once | INTRAMUSCULAR | Status: AC | PRN
  Administered 2019-10-02: 100 mL via INTRAVENOUS

## 2019-10-03 ENCOUNTER — Encounter: Payer: Self-pay | Admitting: Hematology

## 2019-10-03 ENCOUNTER — Inpatient Hospital Stay (HOSPITAL_BASED_OUTPATIENT_CLINIC_OR_DEPARTMENT_OTHER): Payer: Medicare HMO | Admitting: Hematology

## 2019-10-03 DIAGNOSIS — K909 Intestinal malabsorption, unspecified: Secondary | ICD-10-CM

## 2019-10-03 DIAGNOSIS — M25632 Stiffness of left wrist, not elsewhere classified: Secondary | ICD-10-CM | POA: Diagnosis not present

## 2019-10-03 DIAGNOSIS — C20 Malignant neoplasm of rectum: Secondary | ICD-10-CM

## 2019-10-03 DIAGNOSIS — S42402S Unspecified fracture of lower end of left humerus, sequela: Secondary | ICD-10-CM | POA: Diagnosis not present

## 2019-10-03 DIAGNOSIS — M25512 Pain in left shoulder: Secondary | ICD-10-CM | POA: Diagnosis not present

## 2019-10-03 DIAGNOSIS — M25622 Stiffness of left elbow, not elsewhere classified: Secondary | ICD-10-CM | POA: Diagnosis not present

## 2019-10-03 DIAGNOSIS — R197 Diarrhea, unspecified: Secondary | ICD-10-CM

## 2019-10-03 DIAGNOSIS — M25642 Stiffness of left hand, not elsewhere classified: Secondary | ICD-10-CM | POA: Diagnosis not present

## 2019-10-03 DIAGNOSIS — M25612 Stiffness of left shoulder, not elsewhere classified: Secondary | ICD-10-CM | POA: Diagnosis not present

## 2019-10-03 DIAGNOSIS — M25522 Pain in left elbow: Secondary | ICD-10-CM | POA: Diagnosis not present

## 2019-10-03 DIAGNOSIS — S42472D Displaced transcondylar fracture of left humerus, subsequent encounter for fracture with routine healing: Secondary | ICD-10-CM | POA: Diagnosis not present

## 2019-10-03 NOTE — Progress Notes (Signed)
[place]   [phone] Fax:([phone]   Clinic Follow up Note   Patient Care Team: Girtha Rm, NP-C as PCP - General (Family Medicine) Irene Shipper, MD as Consulting Physician (Gastroenterology) Leighton Ruff, MD as Consulting Physician ([facility]) Truitt Merle, MD as Consulting Physician (Hematology) Kyung Rudd, MD as Consulting Physician (Radiation Oncology)  Date of Service:  10/03/2019   I connected withCarol [name] on 10/03/2019 at 8:30am EDTby telephone visitand verified that I am speaking with the correct person using two identifiers.   I discussed the limitations, risks, security and privacy concerns of performing an evaluation and management service by telephone and the availability of in person appointments. I also discussed with the patient that there may be a patient responsible charge related to this service. The patient expressed understanding and agreed to proceed.   Other persons participating in the visit and their role in the encounter:None  Patient's location:Home Provider's location:Office  CHIEF COMPLAINT: F/u on colon cancer  SUMMARY OF ONCOLOGIC HISTORY: Oncology History Overview Note  Cancer Staging Rectal adenocarcinoma [facility]) Staging form: Colon and Rectum, AJCC 8th Edition - Clinical stage from 07/09/2016: Stage IIA (cT3, cN0, cM0) - Signed by Truitt Merle, MD on 08/16/2016     Rectal adenocarcinoma ([place])  07/09/2016 Pathology Results   Diagnosis Surgical [P], rectum mass - INVASIVE ADENOCARCINOMA. - SEE COMMENT.   07/09/2016 Procedure   Colonoscopy A non-obstructing large friable mass was found in the distal rectum. This began at approximately 1 cm above the anal verge and extended proximal for a distance of 8-10 cm. The lesion was bulky and involved approximately 75% of the luminal circumference. Multiple biopsies were taken. A few small-mouthed diverticula were found in the left colon. The  exam was otherwise without abnormality on direct views. Retroflexion not performed purposely.   07/12/2016 Imaging   CT C/A/P with contrast IMPRESSION: Rectal wall thickening/mass, compatible with known rectal adenocarcinoma. No findings specific for metastatic disease in the chest, abdomen, or pelvis. 2.2 cm probable hemangioma in segment 4B, although poorly evaluated. Consider MRI abdomen with/ without multihance contrast for definitive characterization, as clinically warranted. Bilateral adrenal nodules measuring up to 1.6 cm on the right, likely reflecting benign adrenal adenomas, although technically indeterminate. If MRI abdomen is performed, these can be definitively characterized at that time.   07/20/2016 Initial Diagnosis   Rectal adenocarcinoma ([place])   08/03/2016 Imaging   MRI AP W WO Contrast 08/03/16 IMPRESSION: 1. Large rectal mass measures 6.1 cm in length. No obstruction identified. This is compatible with at least a T3bN0M0 lesion. 2. No specific features highly suspicious for nodal metastasis or metastatic disease to the upper abdomen. 3. Indeterminate arterial phase enhancing lesion within the anterior dome of liver measures less than 1 cm. This may represent a benign liver lesions such as FNH or adenoma. Less favored with the hypervascular liver metastasis. Followup imaging at 6 months with repeat MRI of the liver is advise. 4. Bilateral adrenal adenomas   08/04/2016 - 09/16/2016 Radiation Therapy   Concurrent chemo radiation with Dr. [name]   08/04/2016 - 09/16/2016 Chemotherapy   Concurrent chemo radiation with Xeloda, 1549m twice daily   12/06/2016 Surgery   ULTRA LOW ANTERIOR RESECTION OF SIGMOID COLON AND RECTUM WITH COLOANAL ANASTAMOSIS AND LOOP ILEOSTOMY  by Dr. [place] 12/06/16   12/06/2016 Pathology Results   FINAL PATHOLOGIC DIAGNOSIS 12/06/16 MICROSCOPIC EXAMINATION AND DIAGNOSIS  A.ANAL CANAL, MUCOSECTOMY: Benign  rectal and fibroadipose  tissue. Negative for malignancy.  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 Mucinous tumor focally invades through the muscularis propria into pericolorectal tissue. Radial margin interpreted as involved by invasive carcinoma (invasive acellular mucin withrare viable tumor cells focally comes to within 0.2 mm [0.02 cm] of the inked radial soft tissue margin). Treatment effect present, predominantly invasive acellular mucin associated with only very rare small groups of viable tumor cells (near complete response). 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 AJCC Pathologic Stage: ypT3 pN1a. See Cancer Case Summary.  C.POSTERIOR ANAL CANAL, EXCISION: Benign rectal tissue. Negative for malignancy.   01/26/2017 - 05/04/2017 Chemotherapy   Adjuvant FOLFOX q2 weeks, for 8 cycles         04/04/2017 Imaging   CT CAP IMPRESSION: New complete interval resolution of bulky rectal soft tissue mass since prior exam.  No evidence of local or distant metastatic disease.   06/13/2017 Surgery   CLOSURE OF LOOP ILEOSTOMY Dannielle Burn, MD [facility] 06/13/2017   10/19/2017 Imaging   CT CAP IMPRESSION: Status post low anterior section with reanastomosis. Prior diverting ileostomy has been reversed.  No findings suspicious for recurrent or metastatic disease.  Trace gas in the uterine fundus, nonspecific. While not distinctly visualized on this CT, this appearance can sometimes be related to colovaginal fistula.  Additional ancillary findings as above   03/28/2018  Surgery    Duodenal By[facility] on 03/28/18 at [facility]   10/02/2018 Imaging   CT CAP W Contrast  IMPRESSION: 1. Redemonstrated postoperative findings of rectal resection and reanastomosis. No evidence of recurrent mass or abnormal soft tissue in the pelvis.   2. No evidence of metastatic disease in the chest, abdomen, or pelvis.   3. There is a new, lobulated soft tissue nodule in the superficial left gluteal soft tissues measuring 3.0 x 2.2 cm (series 2, image 91). This is likely related to injection or trauma, and a very unlikely manifestation of metastatic disease. This can be further evaluated by ultrasound if desired.   4. 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 This can be further interrogated by fluoroscopy if desired.   5. Coronary artery disease. Aortic Atherosclerosis (ICD10-I70.0) and Emphysema (ICD10-J43.9).      CURRENT THERAPY:  Surveillance  INTERVAL HISTORY:  Tonya [name] is scheduled for a phone visit to discuss restaging CT scan findings.  I verified her identification date of birth and address.  She is clinically stable, still has chronic diarrhea and stool incontinence, which has not changed lately.  Usually does not the day before she has to go out for appointments, due to frequent bowel movement after eating.  She otherwise doing well, denies any pain, abdominal discomfort, hematochezia, nausea, or other symptoms.  Her weight is stable, she is eating well. All other systems were reviewed with the patient and are negative.  MEDICAL HISTORY:  Past Medical History:  Diagnosis Date  . Anemia    few yrs ago  . C. difficile diarrhea 03/2018  . Cancer ([place])    rectal  . CHF (congestive heart failure) ([place])   . Coronary artery disease 07/2003   occluded LAD wtih right-to-left collaterals 07/24/03; no current cardiologist  . Diabetes mellitus without complication ([place])   . Elbow  fracture, left   . History of chemotherapy 01/2018   radaition last tx jan 2019  . Myocardial infarction [facility]) age 89's  . Obesity 2003   s/p gastric bypass   . Vitamin D deficiency   . Wears glasses     SURGICAL HISTORY: Past Surgical History:  Procedure Laterality Date  . ANAL RECTAL MANOMETRY N/A 11/01/2018   Procedure: ANO RECTAL MANOMETRY;  Surgeon: Leighton Ruff, MD;  Location: [facility] ENDOSCOPY;  Service: Endoscopy;  Laterality: N/A;  . CARDIAC CATHETERIZATION     prior to 2015  . CHOLECYSTECTOMY  1999  . GASTRIC BYPASS  2003  . IR FLUORO GUIDE PORT INSERTION RIGHT  01/25/2017  . IR REMOVAL TUN ACCESS W/ PORT W/O FL MOD SED  10/17/2018  . IR US GUIDE VASC ACCESS RIGHT  01/25/2017  . ORIF HUMERUS FRACTURE Left 08/02/2019   Procedure: OPEN REDUCTION INTERNAL FIXATION (ORIF) LEFT ELBOW FRACTURE.;  Surgeon: Altamese Chrisney, MD;  Location: [place];  Service: Orthopedics;  Laterality: Left;  . ostomy placed and removed  12/2017  . SACRAL NERVE STIMULATOR PLACEMENT  01-12-2019   DR Marcello Moores _0    TEST PHASE    I have reviewed the social history and family history with the patient and they are unchanged from previous note.  ALLERGIES:  is allergic to bactrim [sulfamethoxazole-trimethoprim], phenergan [promethazine hcl], sulfa antibiotics, and penicillins.  MEDICATIONS:  Current Outpatient Medications  Medication Sig Dispense Refill  . acetaminophen (TYLENOL) 500 MG tablet Take 1,000 mg by mouth every 8 (eight) hours as needed for moderate pain.    Marland Kitchen aspirin 81 MG chewable tablet Chew 81 mg by mouth daily.    . Cholecalciferol (VITAMIN D3) 2000 units capsule Take 2,000 Units by mouth daily.     . citalopram (CELEXA) 20 MG tablet TAKE 1 TABLET BY MOUTH EVERY DAY 30 tablet 2  . ferrous sulfate (FERROUSUL) 325 (65 FE) MG tablet Take 1 tablet (325 mg total) by mouth daily with breakfast. 30 tablet 6  . Multiple Vitamin (MULTIVITAMIN) tablet Take 1 tablet by mouth daily. (Patient not taking:  Reported on 09/05/2019)    . ondansetron (ZOFRAN ODT) 4 MG disintegrating tablet Take 1 tablet (4 mg total) by mouth every 8 (eight) hours as needed for nausea or vomiting. 20 tablet 0   No current facility-administered medications for this visit.   Facility-Administered Medications Ordered in Other Visits  Medication Dose Route Frequency Provider Last Rate Last Admin  . Tbo-Filgrastim (GRANIX) injection 480 mcg  480 mcg Subcutaneous Once Truitt Merle, MD        PHYSICAL EXAMINATION: ECOG PERFORMANCE STATUS: 1 - Symptomatic but completely ambulatory No exam today   LABORATORY DATA:  I have reviewed the data as listed CBC Latest Ref Rng & Units 10/02/2019 09/05/2019 08/02/2019  WBC 4.0 - 10.5 K/uL 7.1 6.6 6.1  Hemoglobin 12.0 - 15.0 g/dL 12.2 12.0 10.4(L)  Hematocrit 36 - 46 % 37.6 36.0 34.7(L)  Platelets 150 - 400 K/uL 247 239 381     CMP Latest Ref Rng & Units 10/02/2019 09/05/2019 08/02/2019  Glucose 70 - 99 mg/dL 92 93 103(H)  BUN 8 - 23 mg/dL _1 Creatinine 0.44 - 1.00 mg/dL 0.85 0.68 0.77  Sodium 135 - 145 mmol/L 139 141 140  Potassium 3.5 - 5.1 mmol/L 4.1 4.4 3.8  Chloride 98 - 111 mmol/L 105 103 105  CO2 22 - 32 mmol/L _2 Calcium 8.9 - 10.3 mg/dL 9.7 9.5 9.1  Total  Protein 6.5 - 8.1 g/dL 7.3 6.6 6.1(L)  Total Bilirubin 0.3 - 1.2 mg/dL 0.3 0.2 0.4  Alkaline Phos 38 - 126 U/L 67 68 60  AST 15 - 41 U/L _0 ALT 0 - 44 U/L _1 RADIOGRAPHIC STUDIES: I have personally reviewed the radiological images as listed and agreed with the findings in the report. CT Chest W Contrast  Result Date: 10/02/2019 CLINICAL DATA:  Colorectal cancer surveillance EXAM: CT CHEST, ABDOMEN, AND PELVIS WITH CONTRAST TECHNIQUE: Multidetector CT imaging of the chest, abdomen and pelvis was performed following the standard protocol during bolus administration of intravenous contrast. CONTRAST:  118m OMNIPAQUE IOHEXOL 300 MG/ML SOLN, additional oral enteric contrast COMPARISON:   10/02/2018 FINDINGS: CT CHEST FINDINGS Cardiovascular: Aortic atherosclerosis. Normal heart size. Three-vessel coronary artery calcifications. No pericardial effusion. Mediastinum/Nodes: No enlarged mediastinal, hilar, or axillary lymph nodes. Thyroid gland, trachea, and esophagus demonstrate no significant findings. Lungs/Pleura: Lungs are clear. No pleural effusion or pneumothorax. Musculoskeletal: No chest wall mass or suspicious bone lesions identified. CT ABDOMEN PELVIS FINDINGS Hepatobiliary: No focal liver abnormality is seen. Status post cholecystectomy. Mild postoperative biliary dilatation. Pancreas: Unremarkable. No pancreatic ductal dilatation or surrounding inflammatory changes. Spleen: Normal in size without significant abnormality. Adrenals/Urinary Tract: Adrenal glands are unremarkable. Underrotated right kidney. Multiple small nonobstructive right renal calculi. No hydronephrosis. Bladder is unremarkable. Stomach/Bowel: Status post partial sleeve gastrectomy. Redemonstrated mid small bowel resection and reanastomosis. Appendix appears normal. Redemonstrated postoperative findings of rectal resection and reanastomosis. Vascular/Lymphatic: Aortic atherosclerosis. No enlarged abdominal or pelvic lymph nodes. Reproductive: Redemonstrated air within the endometrial cavity. Other: No abdominal wall hernia or abnormality. No abdominopelvic ascites. Musculoskeletal: No acute or significant osseous findings. IMPRESSION: 1. Redemonstrated postoperative findings of rectal resection and reanastomosis. 2. No evidence of locally recurrent or metastatic disease in the chest, abdomen, or pelvis. 3. 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 4. Nonobstructive right nephrolithiasis. 5. Coronary artery disease.  Aortic Atherosclerosis (ICD10-I70.0). Electronically Signed   By: AEddie CandleM.D.   On: 10/02/2019 15:14   CT Abdomen Pelvis W  Contrast  Result Date: 10/02/2019 CLINICAL DATA:  Colorectal cancer surveillance EXAM: CT CHEST, ABDOMEN, AND PELVIS WITH CONTRAST TECHNIQUE: Multidetector CT imaging of the chest, abdomen and pelvis was performed following the standard protocol during bolus administration of intravenous contrast. CONTRAST:  1061mOMNIPAQUE IOHEXOL 300 MG/ML SOLN, additional oral enteric contrast COMPARISON:  10/02/2018 FINDINGS: CT CHEST FINDINGS Cardiovascular: Aortic atherosclerosis. Normal heart size. Three-vessel coronary artery calcifications. No pericardial effusion. Mediastinum/Nodes: No enlarged mediastinal, hilar, or axillary lymph nodes. Thyroid gland, trachea, and esophagus demonstrate no significant findings. Lungs/Pleura: Lungs are clear. No pleural effusion or pneumothorax. Musculoskeletal: No chest wall mass or suspicious bone lesions identified. CT ABDOMEN PELVIS FINDINGS Hepatobiliary: No focal liver abnormality is seen. Status post cholecystectomy. Mild postoperative biliary dilatation. Pancreas: Unremarkable. No pancreatic ductal dilatation or surrounding inflammatory changes. Spleen: Normal in size without significant abnormality. Adrenals/Urinary Tract: Adrenal glands are unremarkable. Underrotated right kidney. Multiple small nonobstructive right renal calculi. No hydronephrosis. Bladder is unremarkable. Stomach/Bowel: Status post partial sleeve gastrectomy. Redemonstrated mid small bowel resection and reanastomosis. Appendix appears normal. Redemonstrated postoperative findings of rectal resection and reanastomosis. Vascular/Lymphatic: Aortic atherosclerosis. No enlarged abdominal or pelvic lymph nodes. Reproductive: Redemonstrated air within the endometrial cavity. Other: No abdominal wall hernia or abnormality. No abdominopelvic ascites. Musculoskeletal: No acute or significant osseous findings. IMPRESSION: 1. Redemonstrated postoperative findings of  rectal resection and reanastomosis. 2. No evidence of  locally recurrent or metastatic disease in the chest, abdomen, or pelvis. 3. 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 4. Nonobstructive right nephrolithiasis. 5. Coronary artery disease.  Aortic Atherosclerosis (ICD10-I70.0). Electronically Signed   By: Eddie Candle M.D.   On: 10/02/2019 15:14     ASSESSMENT & PLAN:  Tonya [name] is a 69 y.o. female with    1. Invasive low rectal adenocarcinoma, cT3N0M0, ypT3N1a, (+) margin, MMR normal  -Diagnosed in 2018. Completedneoadjuvantchemowith concurrentradiation, and surgery. Although she had excellent response to neoadjuvant chemoradiation, her residual tumor was still T3 and 1/12 node was positive, andradial margin was positive.She did complete 4 months adjuvant FOLFOX  -Currently on 5 year surveillance.  -She underwent Duodenal Bypass reversal on 03/28/18 at [facility] her diarrhea and malnutrition with only mild improvement. Her sacral nerve stimulator was unsuccessful in 01/2019.  -From a colon cancer standpoint she is doing well. She continues to have impactful diarrhea/incontinence.  -I reviewed her recent CT scan which showed no evidence of disease, lab reviewed, CBC, CMP and a CEA are within normal limits. -It has been over 3 years since her initial diagnosis the risk of recurrence has decreased significantly.  I do not plan to repeat more surveillance CT scan lab and office visit for 2 more years.  2.Diarrhea andcompletestool incontinence, rectal soreness and skin irritation, Fatigue  -Started after herileostomy reversal surgery. Shehas repeatedlyrefused to have another colostomy bag -She has been seen byseveralcolorectal surgeonsand physicians. It is suspected this prolonged diarrhea is due to her gastric bypass. -She underwent Duodenal Bypass reversal on 03/28/18 at [facility] with only mild improvement. She did have a bout of C. Diff  after reversal.  -She tried Sacral nerve stimulator with Dr. Marcello Moores in 01/2019. This was unsuccessful and removed. (01/12/19-01/19/19) -Her chronic diarrhea is mostly stable. not changed and still impacting her life. She notes she is also fatigued most of the time.   3. DM, HTN, Weight Gain  -No on medications, well controlled -Since her Gastric bypass was reversed she has gained 50 pounds.  -I discussed watching her diet with low sugar intake and increase exercise and activity level.   4. Depression -stable overall, she is depressed mainly due to herstool incontinenceand chronic diarrhea -She does not feel like living given how her diarrhea impacts her life. She denies having suicidal thoughts.   -stable    Plan -lab and f/u in 6 months    No problem-specific Assessment & Plan notes found for this encounter.   No orders of the defined types were placed in this encounter.  All questions were answered. The patient knows to call the clinic with any problems, questions or concerns. No barriers to learning was detected. The total time spent in the appointment was 20 minutes.     Truitt Merle, MD 10/03/2019   I, Joslyn Devon, am acting as scribe for Truitt Merle, MD.   I have reviewed the above documentation for accuracy and completeness, and I agree with the above.

## 2019-10-04 DIAGNOSIS — M25632 Stiffness of left wrist, not elsewhere classified: Secondary | ICD-10-CM | POA: Diagnosis not present

## 2019-10-04 DIAGNOSIS — M25642 Stiffness of left hand, not elsewhere classified: Secondary | ICD-10-CM | POA: Diagnosis not present

## 2019-10-04 DIAGNOSIS — M25522 Pain in left elbow: Secondary | ICD-10-CM | POA: Diagnosis not present

## 2019-10-04 DIAGNOSIS — M25612 Stiffness of left shoulder, not elsewhere classified: Secondary | ICD-10-CM | POA: Diagnosis not present

## 2019-10-04 DIAGNOSIS — S42402S Unspecified fracture of lower end of left humerus, sequela: Secondary | ICD-10-CM | POA: Diagnosis not present

## 2019-10-04 DIAGNOSIS — M25622 Stiffness of left elbow, not elsewhere classified: Secondary | ICD-10-CM | POA: Diagnosis not present

## 2019-10-04 DIAGNOSIS — M25512 Pain in left shoulder: Secondary | ICD-10-CM | POA: Diagnosis not present

## 2019-10-09 DIAGNOSIS — M25612 Stiffness of left shoulder, not elsewhere classified: Secondary | ICD-10-CM | POA: Diagnosis not present

## 2019-10-09 DIAGNOSIS — M25522 Pain in left elbow: Secondary | ICD-10-CM | POA: Diagnosis not present

## 2019-10-09 DIAGNOSIS — M25642 Stiffness of left hand, not elsewhere classified: Secondary | ICD-10-CM | POA: Diagnosis not present

## 2019-10-09 DIAGNOSIS — S42402S Unspecified fracture of lower end of left humerus, sequela: Secondary | ICD-10-CM | POA: Diagnosis not present

## 2019-10-09 DIAGNOSIS — M25632 Stiffness of left wrist, not elsewhere classified: Secondary | ICD-10-CM | POA: Diagnosis not present

## 2019-10-09 DIAGNOSIS — M25622 Stiffness of left elbow, not elsewhere classified: Secondary | ICD-10-CM | POA: Diagnosis not present

## 2019-10-09 DIAGNOSIS — M25512 Pain in left shoulder: Secondary | ICD-10-CM | POA: Diagnosis not present

## 2019-10-10 DIAGNOSIS — M25512 Pain in left shoulder: Secondary | ICD-10-CM | POA: Diagnosis not present

## 2019-10-10 DIAGNOSIS — M25522 Pain in left elbow: Secondary | ICD-10-CM | POA: Diagnosis not present

## 2019-10-10 DIAGNOSIS — M25642 Stiffness of left hand, not elsewhere classified: Secondary | ICD-10-CM | POA: Diagnosis not present

## 2019-10-10 DIAGNOSIS — M25632 Stiffness of left wrist, not elsewhere classified: Secondary | ICD-10-CM | POA: Diagnosis not present

## 2019-10-10 DIAGNOSIS — M25622 Stiffness of left elbow, not elsewhere classified: Secondary | ICD-10-CM | POA: Diagnosis not present

## 2019-10-10 DIAGNOSIS — M25612 Stiffness of left shoulder, not elsewhere classified: Secondary | ICD-10-CM | POA: Diagnosis not present

## 2019-10-10 DIAGNOSIS — S42402S Unspecified fracture of lower end of left humerus, sequela: Secondary | ICD-10-CM | POA: Diagnosis not present

## 2019-10-11 DIAGNOSIS — M25512 Pain in left shoulder: Secondary | ICD-10-CM | POA: Diagnosis not present

## 2019-10-11 DIAGNOSIS — M25522 Pain in left elbow: Secondary | ICD-10-CM | POA: Diagnosis not present

## 2019-10-11 DIAGNOSIS — M25632 Stiffness of left wrist, not elsewhere classified: Secondary | ICD-10-CM | POA: Diagnosis not present

## 2019-10-11 DIAGNOSIS — M25642 Stiffness of left hand, not elsewhere classified: Secondary | ICD-10-CM | POA: Diagnosis not present

## 2019-10-11 DIAGNOSIS — M25622 Stiffness of left elbow, not elsewhere classified: Secondary | ICD-10-CM | POA: Diagnosis not present

## 2019-10-11 DIAGNOSIS — M25612 Stiffness of left shoulder, not elsewhere classified: Secondary | ICD-10-CM | POA: Diagnosis not present

## 2019-10-11 DIAGNOSIS — S42402S Unspecified fracture of lower end of left humerus, sequela: Secondary | ICD-10-CM | POA: Diagnosis not present

## 2019-10-15 DIAGNOSIS — R69 Illness, unspecified: Secondary | ICD-10-CM | POA: Diagnosis not present

## 2019-10-16 DIAGNOSIS — M25612 Stiffness of left shoulder, not elsewhere classified: Secondary | ICD-10-CM | POA: Diagnosis not present

## 2019-10-16 DIAGNOSIS — S42402S Unspecified fracture of lower end of left humerus, sequela: Secondary | ICD-10-CM | POA: Diagnosis not present

## 2019-10-16 DIAGNOSIS — M25632 Stiffness of left wrist, not elsewhere classified: Secondary | ICD-10-CM | POA: Diagnosis not present

## 2019-10-16 DIAGNOSIS — M25522 Pain in left elbow: Secondary | ICD-10-CM | POA: Diagnosis not present

## 2019-10-16 DIAGNOSIS — M25642 Stiffness of left hand, not elsewhere classified: Secondary | ICD-10-CM | POA: Diagnosis not present

## 2019-10-16 DIAGNOSIS — M25622 Stiffness of left elbow, not elsewhere classified: Secondary | ICD-10-CM | POA: Diagnosis not present

## 2019-10-16 DIAGNOSIS — M25512 Pain in left shoulder: Secondary | ICD-10-CM | POA: Diagnosis not present

## 2019-10-17 DIAGNOSIS — M25612 Stiffness of left shoulder, not elsewhere classified: Secondary | ICD-10-CM | POA: Diagnosis not present

## 2019-10-17 DIAGNOSIS — M25642 Stiffness of left hand, not elsewhere classified: Secondary | ICD-10-CM | POA: Diagnosis not present

## 2019-10-17 DIAGNOSIS — M25622 Stiffness of left elbow, not elsewhere classified: Secondary | ICD-10-CM | POA: Diagnosis not present

## 2019-10-17 DIAGNOSIS — S42402S Unspecified fracture of lower end of left humerus, sequela: Secondary | ICD-10-CM | POA: Diagnosis not present

## 2019-10-17 DIAGNOSIS — M25512 Pain in left shoulder: Secondary | ICD-10-CM | POA: Diagnosis not present

## 2019-10-17 DIAGNOSIS — M25632 Stiffness of left wrist, not elsewhere classified: Secondary | ICD-10-CM | POA: Diagnosis not present

## 2019-10-17 DIAGNOSIS — M25522 Pain in left elbow: Secondary | ICD-10-CM | POA: Diagnosis not present

## 2019-10-18 DIAGNOSIS — M25632 Stiffness of left wrist, not elsewhere classified: Secondary | ICD-10-CM | POA: Diagnosis not present

## 2019-10-18 DIAGNOSIS — M25622 Stiffness of left elbow, not elsewhere classified: Secondary | ICD-10-CM | POA: Diagnosis not present

## 2019-10-18 DIAGNOSIS — M25642 Stiffness of left hand, not elsewhere classified: Secondary | ICD-10-CM | POA: Diagnosis not present

## 2019-10-18 DIAGNOSIS — M25512 Pain in left shoulder: Secondary | ICD-10-CM | POA: Diagnosis not present

## 2019-10-18 DIAGNOSIS — M25522 Pain in left elbow: Secondary | ICD-10-CM | POA: Diagnosis not present

## 2019-10-18 DIAGNOSIS — S42402S Unspecified fracture of lower end of left humerus, sequela: Secondary | ICD-10-CM | POA: Diagnosis not present

## 2019-10-18 DIAGNOSIS — M25612 Stiffness of left shoulder, not elsewhere classified: Secondary | ICD-10-CM | POA: Diagnosis not present

## 2019-10-23 ENCOUNTER — Encounter: Payer: Self-pay | Admitting: Family Medicine

## 2019-10-23 DIAGNOSIS — M25612 Stiffness of left shoulder, not elsewhere classified: Secondary | ICD-10-CM | POA: Diagnosis not present

## 2019-10-23 DIAGNOSIS — M25522 Pain in left elbow: Secondary | ICD-10-CM | POA: Diagnosis not present

## 2019-10-23 DIAGNOSIS — M25512 Pain in left shoulder: Secondary | ICD-10-CM | POA: Diagnosis not present

## 2019-10-23 DIAGNOSIS — M25632 Stiffness of left wrist, not elsewhere classified: Secondary | ICD-10-CM | POA: Diagnosis not present

## 2019-10-23 DIAGNOSIS — M25642 Stiffness of left hand, not elsewhere classified: Secondary | ICD-10-CM | POA: Diagnosis not present

## 2019-10-23 DIAGNOSIS — M25622 Stiffness of left elbow, not elsewhere classified: Secondary | ICD-10-CM | POA: Diagnosis not present

## 2019-10-23 DIAGNOSIS — S42402S Unspecified fracture of lower end of left humerus, sequela: Secondary | ICD-10-CM | POA: Diagnosis not present

## 2019-10-25 DIAGNOSIS — R69 Illness, unspecified: Secondary | ICD-10-CM | POA: Diagnosis not present

## 2019-10-26 ENCOUNTER — Telehealth: Payer: Self-pay

## 2019-10-26 NOTE — Telephone Encounter (Signed)
Wells Guiles from [employer]. Called to let us know the the pt. Had a fracture of the lt. humerous on 08/02/19 and they didn't have any record of her having a DEXA scan in the past 2 years.

## 2019-10-27 NOTE — Telephone Encounter (Signed)
Please check and see if she scheduled her DEXA. We discussed this at her recent [facility] and has had a fracture triggering her insurance to ask me about this.

## 2019-10-29 NOTE — Telephone Encounter (Signed)
Yes scheduled for december

## 2019-10-30 DIAGNOSIS — M25642 Stiffness of left hand, not elsewhere classified: Secondary | ICD-10-CM | POA: Diagnosis not present

## 2019-10-30 DIAGNOSIS — M25512 Pain in left shoulder: Secondary | ICD-10-CM | POA: Diagnosis not present

## 2019-10-30 DIAGNOSIS — M25522 Pain in left elbow: Secondary | ICD-10-CM | POA: Diagnosis not present

## 2019-10-30 DIAGNOSIS — M25632 Stiffness of left wrist, not elsewhere classified: Secondary | ICD-10-CM | POA: Diagnosis not present

## 2019-10-30 DIAGNOSIS — S42402S Unspecified fracture of lower end of left humerus, sequela: Secondary | ICD-10-CM | POA: Diagnosis not present

## 2019-10-30 DIAGNOSIS — M25622 Stiffness of left elbow, not elsewhere classified: Secondary | ICD-10-CM | POA: Diagnosis not present

## 2019-10-30 DIAGNOSIS — M25612 Stiffness of left shoulder, not elsewhere classified: Secondary | ICD-10-CM | POA: Diagnosis not present

## 2019-10-31 DIAGNOSIS — M25522 Pain in left elbow: Secondary | ICD-10-CM | POA: Diagnosis not present

## 2019-10-31 DIAGNOSIS — S42402S Unspecified fracture of lower end of left humerus, sequela: Secondary | ICD-10-CM | POA: Diagnosis not present

## 2019-10-31 DIAGNOSIS — M25512 Pain in left shoulder: Secondary | ICD-10-CM | POA: Diagnosis not present

## 2019-10-31 DIAGNOSIS — M25612 Stiffness of left shoulder, not elsewhere classified: Secondary | ICD-10-CM | POA: Diagnosis not present

## 2019-10-31 DIAGNOSIS — M25622 Stiffness of left elbow, not elsewhere classified: Secondary | ICD-10-CM | POA: Diagnosis not present

## 2019-10-31 DIAGNOSIS — S42472D Displaced transcondylar fracture of left humerus, subsequent encounter for fracture with routine healing: Secondary | ICD-10-CM | POA: Diagnosis not present

## 2019-10-31 DIAGNOSIS — M25632 Stiffness of left wrist, not elsewhere classified: Secondary | ICD-10-CM | POA: Diagnosis not present

## 2019-10-31 DIAGNOSIS — M25642 Stiffness of left hand, not elsewhere classified: Secondary | ICD-10-CM | POA: Diagnosis not present

## 2019-11-01 DIAGNOSIS — M25632 Stiffness of left wrist, not elsewhere classified: Secondary | ICD-10-CM | POA: Diagnosis not present

## 2019-11-01 DIAGNOSIS — M25642 Stiffness of left hand, not elsewhere classified: Secondary | ICD-10-CM | POA: Diagnosis not present

## 2019-11-01 DIAGNOSIS — S42402D Unspecified fracture of lower end of left humerus, subsequent encounter for fracture with routine healing: Secondary | ICD-10-CM | POA: Diagnosis not present

## 2019-11-01 DIAGNOSIS — S42402S Unspecified fracture of lower end of left humerus, sequela: Secondary | ICD-10-CM | POA: Diagnosis not present

## 2019-11-01 DIAGNOSIS — M25522 Pain in left elbow: Secondary | ICD-10-CM | POA: Diagnosis not present

## 2019-11-01 DIAGNOSIS — M25612 Stiffness of left shoulder, not elsewhere classified: Secondary | ICD-10-CM | POA: Diagnosis not present

## 2019-11-01 DIAGNOSIS — M25512 Pain in left shoulder: Secondary | ICD-10-CM | POA: Diagnosis not present

## 2019-11-01 DIAGNOSIS — M25622 Stiffness of left elbow, not elsewhere classified: Secondary | ICD-10-CM | POA: Diagnosis not present

## 2019-11-06 DIAGNOSIS — M25512 Pain in left shoulder: Secondary | ICD-10-CM | POA: Diagnosis not present

## 2019-11-06 DIAGNOSIS — M25522 Pain in left elbow: Secondary | ICD-10-CM | POA: Diagnosis not present

## 2019-11-06 DIAGNOSIS — M25632 Stiffness of left wrist, not elsewhere classified: Secondary | ICD-10-CM | POA: Diagnosis not present

## 2019-11-06 DIAGNOSIS — S42402S Unspecified fracture of lower end of left humerus, sequela: Secondary | ICD-10-CM | POA: Diagnosis not present

## 2019-11-06 DIAGNOSIS — M25622 Stiffness of left elbow, not elsewhere classified: Secondary | ICD-10-CM | POA: Diagnosis not present

## 2019-11-06 DIAGNOSIS — M25642 Stiffness of left hand, not elsewhere classified: Secondary | ICD-10-CM | POA: Diagnosis not present

## 2019-11-06 DIAGNOSIS — M25612 Stiffness of left shoulder, not elsewhere classified: Secondary | ICD-10-CM | POA: Diagnosis not present

## 2019-11-08 DIAGNOSIS — M25642 Stiffness of left hand, not elsewhere classified: Secondary | ICD-10-CM | POA: Diagnosis not present

## 2019-11-08 DIAGNOSIS — S42402S Unspecified fracture of lower end of left humerus, sequela: Secondary | ICD-10-CM | POA: Diagnosis not present

## 2019-11-08 DIAGNOSIS — M25512 Pain in left shoulder: Secondary | ICD-10-CM | POA: Diagnosis not present

## 2019-11-08 DIAGNOSIS — M25632 Stiffness of left wrist, not elsewhere classified: Secondary | ICD-10-CM | POA: Diagnosis not present

## 2019-11-08 DIAGNOSIS — M25612 Stiffness of left shoulder, not elsewhere classified: Secondary | ICD-10-CM | POA: Diagnosis not present

## 2019-11-08 DIAGNOSIS — M25622 Stiffness of left elbow, not elsewhere classified: Secondary | ICD-10-CM | POA: Diagnosis not present

## 2019-11-08 DIAGNOSIS — M25522 Pain in left elbow: Secondary | ICD-10-CM | POA: Diagnosis not present

## 2019-11-13 DIAGNOSIS — M25622 Stiffness of left elbow, not elsewhere classified: Secondary | ICD-10-CM | POA: Diagnosis not present

## 2019-11-13 DIAGNOSIS — M25512 Pain in left shoulder: Secondary | ICD-10-CM | POA: Diagnosis not present

## 2019-11-13 DIAGNOSIS — M25612 Stiffness of left shoulder, not elsewhere classified: Secondary | ICD-10-CM | POA: Diagnosis not present

## 2019-11-13 DIAGNOSIS — M25632 Stiffness of left wrist, not elsewhere classified: Secondary | ICD-10-CM | POA: Diagnosis not present

## 2019-11-13 DIAGNOSIS — M25522 Pain in left elbow: Secondary | ICD-10-CM | POA: Diagnosis not present

## 2019-11-13 DIAGNOSIS — M25642 Stiffness of left hand, not elsewhere classified: Secondary | ICD-10-CM | POA: Diagnosis not present

## 2019-11-13 DIAGNOSIS — S42402S Unspecified fracture of lower end of left humerus, sequela: Secondary | ICD-10-CM | POA: Diagnosis not present

## 2019-11-15 DIAGNOSIS — M25512 Pain in left shoulder: Secondary | ICD-10-CM | POA: Diagnosis not present

## 2019-11-15 DIAGNOSIS — S42402S Unspecified fracture of lower end of left humerus, sequela: Secondary | ICD-10-CM | POA: Diagnosis not present

## 2019-11-15 DIAGNOSIS — M25612 Stiffness of left shoulder, not elsewhere classified: Secondary | ICD-10-CM | POA: Diagnosis not present

## 2019-11-15 DIAGNOSIS — M25642 Stiffness of left hand, not elsewhere classified: Secondary | ICD-10-CM | POA: Diagnosis not present

## 2019-11-15 DIAGNOSIS — M25622 Stiffness of left elbow, not elsewhere classified: Secondary | ICD-10-CM | POA: Diagnosis not present

## 2019-11-15 DIAGNOSIS — M25632 Stiffness of left wrist, not elsewhere classified: Secondary | ICD-10-CM | POA: Diagnosis not present

## 2019-11-15 DIAGNOSIS — M25522 Pain in left elbow: Secondary | ICD-10-CM | POA: Diagnosis not present

## 2019-11-20 DIAGNOSIS — M25642 Stiffness of left hand, not elsewhere classified: Secondary | ICD-10-CM | POA: Diagnosis not present

## 2019-11-20 DIAGNOSIS — S42402S Unspecified fracture of lower end of left humerus, sequela: Secondary | ICD-10-CM | POA: Diagnosis not present

## 2019-11-20 DIAGNOSIS — M25612 Stiffness of left shoulder, not elsewhere classified: Secondary | ICD-10-CM | POA: Diagnosis not present

## 2019-11-20 DIAGNOSIS — M25522 Pain in left elbow: Secondary | ICD-10-CM | POA: Diagnosis not present

## 2019-11-20 DIAGNOSIS — H2512 Age-related nuclear cataract, left eye: Secondary | ICD-10-CM | POA: Diagnosis not present

## 2019-11-20 DIAGNOSIS — H2511 Age-related nuclear cataract, right eye: Secondary | ICD-10-CM | POA: Diagnosis not present

## 2019-11-20 DIAGNOSIS — M25632 Stiffness of left wrist, not elsewhere classified: Secondary | ICD-10-CM | POA: Diagnosis not present

## 2019-11-20 DIAGNOSIS — M25622 Stiffness of left elbow, not elsewhere classified: Secondary | ICD-10-CM | POA: Diagnosis not present

## 2019-11-20 DIAGNOSIS — M25512 Pain in left shoulder: Secondary | ICD-10-CM | POA: Diagnosis not present

## 2019-11-22 DIAGNOSIS — S42402S Unspecified fracture of lower end of left humerus, sequela: Secondary | ICD-10-CM | POA: Diagnosis not present

## 2019-11-22 DIAGNOSIS — M25612 Stiffness of left shoulder, not elsewhere classified: Secondary | ICD-10-CM | POA: Diagnosis not present

## 2019-11-22 DIAGNOSIS — M25642 Stiffness of left hand, not elsewhere classified: Secondary | ICD-10-CM | POA: Diagnosis not present

## 2019-11-22 DIAGNOSIS — M25632 Stiffness of left wrist, not elsewhere classified: Secondary | ICD-10-CM | POA: Diagnosis not present

## 2019-11-22 DIAGNOSIS — M25622 Stiffness of left elbow, not elsewhere classified: Secondary | ICD-10-CM | POA: Diagnosis not present

## 2019-11-22 DIAGNOSIS — M25512 Pain in left shoulder: Secondary | ICD-10-CM | POA: Diagnosis not present

## 2019-11-22 DIAGNOSIS — M25522 Pain in left elbow: Secondary | ICD-10-CM | POA: Diagnosis not present

## 2019-11-27 DIAGNOSIS — M25522 Pain in left elbow: Secondary | ICD-10-CM | POA: Diagnosis not present

## 2019-11-27 DIAGNOSIS — M25642 Stiffness of left hand, not elsewhere classified: Secondary | ICD-10-CM | POA: Diagnosis not present

## 2019-11-27 DIAGNOSIS — S42402S Unspecified fracture of lower end of left humerus, sequela: Secondary | ICD-10-CM | POA: Diagnosis not present

## 2019-11-27 DIAGNOSIS — M25612 Stiffness of left shoulder, not elsewhere classified: Secondary | ICD-10-CM | POA: Diagnosis not present

## 2019-11-27 DIAGNOSIS — M25512 Pain in left shoulder: Secondary | ICD-10-CM | POA: Diagnosis not present

## 2019-11-27 DIAGNOSIS — M25632 Stiffness of left wrist, not elsewhere classified: Secondary | ICD-10-CM | POA: Diagnosis not present

## 2019-11-27 DIAGNOSIS — M25622 Stiffness of left elbow, not elsewhere classified: Secondary | ICD-10-CM | POA: Diagnosis not present

## 2019-12-07 DIAGNOSIS — Z01818 Encounter for other preprocedural examination: Secondary | ICD-10-CM | POA: Diagnosis not present

## 2019-12-07 DIAGNOSIS — H2511 Age-related nuclear cataract, right eye: Secondary | ICD-10-CM | POA: Diagnosis not present

## 2019-12-10 ENCOUNTER — Other Ambulatory Visit: Payer: Medicare HMO

## 2019-12-10 ENCOUNTER — Ambulatory Visit: Payer: Medicare HMO

## 2019-12-11 DIAGNOSIS — S42402S Unspecified fracture of lower end of left humerus, sequela: Secondary | ICD-10-CM | POA: Diagnosis not present

## 2019-12-11 DIAGNOSIS — M25512 Pain in left shoulder: Secondary | ICD-10-CM | POA: Diagnosis not present

## 2019-12-11 DIAGNOSIS — M25632 Stiffness of left wrist, not elsewhere classified: Secondary | ICD-10-CM | POA: Diagnosis not present

## 2019-12-11 DIAGNOSIS — M25642 Stiffness of left hand, not elsewhere classified: Secondary | ICD-10-CM | POA: Diagnosis not present

## 2019-12-11 DIAGNOSIS — M25622 Stiffness of left elbow, not elsewhere classified: Secondary | ICD-10-CM | POA: Diagnosis not present

## 2019-12-11 DIAGNOSIS — M25612 Stiffness of left shoulder, not elsewhere classified: Secondary | ICD-10-CM | POA: Diagnosis not present

## 2019-12-11 DIAGNOSIS — M25522 Pain in left elbow: Secondary | ICD-10-CM | POA: Diagnosis not present

## 2019-12-12 DIAGNOSIS — M25522 Pain in left elbow: Secondary | ICD-10-CM | POA: Diagnosis not present

## 2019-12-12 DIAGNOSIS — M25642 Stiffness of left hand, not elsewhere classified: Secondary | ICD-10-CM | POA: Diagnosis not present

## 2019-12-12 DIAGNOSIS — M25622 Stiffness of left elbow, not elsewhere classified: Secondary | ICD-10-CM | POA: Diagnosis not present

## 2019-12-12 DIAGNOSIS — S42402S Unspecified fracture of lower end of left humerus, sequela: Secondary | ICD-10-CM | POA: Diagnosis not present

## 2019-12-12 DIAGNOSIS — M25512 Pain in left shoulder: Secondary | ICD-10-CM | POA: Diagnosis not present

## 2019-12-12 DIAGNOSIS — M25632 Stiffness of left wrist, not elsewhere classified: Secondary | ICD-10-CM | POA: Diagnosis not present

## 2019-12-12 DIAGNOSIS — M25612 Stiffness of left shoulder, not elsewhere classified: Secondary | ICD-10-CM | POA: Diagnosis not present

## 2019-12-17 DIAGNOSIS — M25612 Stiffness of left shoulder, not elsewhere classified: Secondary | ICD-10-CM | POA: Diagnosis not present

## 2019-12-17 DIAGNOSIS — M25522 Pain in left elbow: Secondary | ICD-10-CM | POA: Diagnosis not present

## 2019-12-17 DIAGNOSIS — S42402S Unspecified fracture of lower end of left humerus, sequela: Secondary | ICD-10-CM | POA: Diagnosis not present

## 2019-12-17 DIAGNOSIS — M25632 Stiffness of left wrist, not elsewhere classified: Secondary | ICD-10-CM | POA: Diagnosis not present

## 2019-12-17 DIAGNOSIS — M25512 Pain in left shoulder: Secondary | ICD-10-CM | POA: Diagnosis not present

## 2019-12-17 DIAGNOSIS — M25642 Stiffness of left hand, not elsewhere classified: Secondary | ICD-10-CM | POA: Diagnosis not present

## 2019-12-17 DIAGNOSIS — M25622 Stiffness of left elbow, not elsewhere classified: Secondary | ICD-10-CM | POA: Diagnosis not present

## 2019-12-18 DIAGNOSIS — M25522 Pain in left elbow: Secondary | ICD-10-CM | POA: Diagnosis not present

## 2019-12-18 DIAGNOSIS — S42402S Unspecified fracture of lower end of left humerus, sequela: Secondary | ICD-10-CM | POA: Diagnosis not present

## 2019-12-18 DIAGNOSIS — M25642 Stiffness of left hand, not elsewhere classified: Secondary | ICD-10-CM | POA: Diagnosis not present

## 2019-12-18 DIAGNOSIS — M25512 Pain in left shoulder: Secondary | ICD-10-CM | POA: Diagnosis not present

## 2019-12-18 DIAGNOSIS — M25632 Stiffness of left wrist, not elsewhere classified: Secondary | ICD-10-CM | POA: Diagnosis not present

## 2019-12-18 DIAGNOSIS — M25612 Stiffness of left shoulder, not elsewhere classified: Secondary | ICD-10-CM | POA: Diagnosis not present

## 2019-12-18 DIAGNOSIS — M25622 Stiffness of left elbow, not elsewhere classified: Secondary | ICD-10-CM | POA: Diagnosis not present

## 2019-12-19 DIAGNOSIS — M25642 Stiffness of left hand, not elsewhere classified: Secondary | ICD-10-CM | POA: Diagnosis not present

## 2019-12-19 DIAGNOSIS — S42472D Displaced transcondylar fracture of left humerus, subsequent encounter for fracture with routine healing: Secondary | ICD-10-CM | POA: Diagnosis not present

## 2019-12-20 DIAGNOSIS — H2512 Age-related nuclear cataract, left eye: Secondary | ICD-10-CM | POA: Diagnosis not present

## 2019-12-25 DIAGNOSIS — M25622 Stiffness of left elbow, not elsewhere classified: Secondary | ICD-10-CM | POA: Diagnosis not present

## 2019-12-25 DIAGNOSIS — S42402S Unspecified fracture of lower end of left humerus, sequela: Secondary | ICD-10-CM | POA: Diagnosis not present

## 2019-12-25 DIAGNOSIS — M25522 Pain in left elbow: Secondary | ICD-10-CM | POA: Diagnosis not present

## 2019-12-25 DIAGNOSIS — M25612 Stiffness of left shoulder, not elsewhere classified: Secondary | ICD-10-CM | POA: Diagnosis not present

## 2019-12-25 DIAGNOSIS — M25512 Pain in left shoulder: Secondary | ICD-10-CM | POA: Diagnosis not present

## 2019-12-25 DIAGNOSIS — M25642 Stiffness of left hand, not elsewhere classified: Secondary | ICD-10-CM | POA: Diagnosis not present

## 2019-12-25 DIAGNOSIS — M25632 Stiffness of left wrist, not elsewhere classified: Secondary | ICD-10-CM | POA: Diagnosis not present

## 2020-01-16 DIAGNOSIS — Z0100 Encounter for examination of eyes and vision without abnormal findings: Secondary | ICD-10-CM | POA: Diagnosis not present

## 2020-01-28 ENCOUNTER — Encounter: Payer: Self-pay | Admitting: Family Medicine

## 2020-01-28 ENCOUNTER — Telehealth (INDEPENDENT_AMBULATORY_CARE_PROVIDER_SITE_OTHER): Payer: Medicare HMO | Admitting: Family Medicine

## 2020-01-28 ENCOUNTER — Other Ambulatory Visit: Payer: Self-pay

## 2020-01-28 VITALS — Wt 173.0 lb

## 2020-01-28 DIAGNOSIS — F339 Major depressive disorder, recurrent, unspecified: Secondary | ICD-10-CM | POA: Diagnosis not present

## 2020-01-28 DIAGNOSIS — R69 Illness, unspecified: Secondary | ICD-10-CM | POA: Diagnosis not present

## 2020-01-28 MED ORDER — ESCITALOPRAM OXALATE 10 MG PO TABS: 10.0000 mg | ORAL_TABLET | Freq: Every day | ORAL | 2 refills | Status: DC

## 2020-01-28 NOTE — Progress Notes (Signed)
   Subjective:  Documentation for virtual telephone encounter.   The patient was located at home. The provider was located in the office. The patient did consent to this visit and is aware of possible charges through their insurance for this visit.  The other persons participating in this telemedicine service were none. Time spent on call was 11 minutes and in review of previous records 14 minutes total.  This virtual service is not related to other E/M service within previous 7 days.     Patient ID: Tonya [name], female    DOB: 09-25-1950, 70 y.o.   MRN: [id number]  HPI Chief Complaint  Patient presents with  . discuss lexapro    Discuss getting on lexapro   Is a virtual visit to discuss depression and starting a new medication.  Patient would like to start on Lexapro.  States she has not had any side effects from SSRIs in the past.  She is optimistic this medication may work.  States a psychiatrist in the past recommended.  She is not currently seeing a counselor or mental health specialist.    Depression screen [facility] 2/9 01/28/2020 09/05/2019 04/20/2019 04/20/2019 11/01/2016  Decreased Interest 0 0 0 3 0  Down, Depressed, Hopeless 3 0 3 3 0  PHQ - 2 Score 3 0 3 6 0  Altered sleeping 3 - 0 0 -  Tired, decreased energy 3 - 1 1 -  Change in appetite 0 - 0 3 -  Feeling bad or failure about yourself  0 - 0 0 -  Trouble concentrating 0 - 0 0 -  Moving slowly or fidgety/restless 1 - 0 0 -  Suicidal thoughts 0 - 0 0 -  PHQ-9 Score 10 - 4 10 -  Difficult doing work/chores - - Not difficult at all Not difficult at all -  Some recent data might be hidden       Review of Systems Pertinent positives and negatives in the history of present illness.     Objective:   Physical Exam Wt 173 lb (78.5 kg)   BMI 32.42 kg/m   Alert and oriented and in no acute distress.  Normal speech, mood and thought process.  Denies SI.      Assessment & Plan:  Depression, recurrent ([place]) -  Plan: escitalopram (LEXAPRO) 10 MG tablet  Discussed limitations of a virtual visit. She would like to try Lexapro and I think this is a good idea.  Discussed potential side effects and how to take the medication.  She will start on 1/2 tablet for the first week and if she is doing well she will increase to the full dose.  She will follow-up with me in 2 weeks virtually or sooner if needed.

## 2020-01-29 ENCOUNTER — Encounter: Payer: Self-pay | Admitting: Family Medicine

## 2020-02-12 DIAGNOSIS — F339 Major depressive disorder, recurrent, unspecified: Secondary | ICD-10-CM | POA: Insufficient documentation

## 2020-02-12 NOTE — Progress Notes (Signed)
   Subjective:  Documentation for virtual telephone encounter.  The patient was located at home. The provider was located in the office. The patient did consent to this visit and is aware of possible charges through their insurance for this visit.  The other persons participating in this telemedicine service were none. Time spent on call was 6 minutes and in review of previous records 8 minutes total.  This virtual service is not related to other E/M service within previous 7 days.  99441 (5-24min) 99442 (11-26min) 99443 (21-77min)    Patient ID: Tonya [name], female    DOB: [birth year], 70 y.o.   MRN: [id number]  HPI Chief Complaint  Patient presents with  . Follow-up    Follow-up on depression, doing well   This is a 2 week follow up on depression and starting on Lexapro.  States she took 1/2 tablet for the first week and has been taking the full tablet for the past week.  Denies any side effects.  States she cannot yet tell a change in her mood but it is not any worse than prior to starting on the medicine. She does plan on continuing the medication. She is not currently in counseling and is not interested at this time.  No new concerns or complaints.    Review of Systems Pertinent positives and negatives in the history of present illness.     Objective:   Physical Exam Wt 175 lb (79.4 kg)   BMI 32.80 kg/m   Alert and oriented in no acute distress.  Speaking in complete sentences without difficulty.  Normal speech and thought process.      Assessment & Plan:  Depression, recurrent ([place])  This is a telephone call only to follow-up on depression and starting Lexapro 2 weeks ago.  She is doing fine on the medication and plans to continue it.  No side effects.  Declines counseling.  Discussed that if she is not noticing any benefit in 2 to 3 weeks, let me know.

## 2020-02-13 ENCOUNTER — Telehealth (INDEPENDENT_AMBULATORY_CARE_PROVIDER_SITE_OTHER): Payer: Medicare HMO | Admitting: Family Medicine

## 2020-02-13 ENCOUNTER — Other Ambulatory Visit: Payer: Self-pay

## 2020-02-13 ENCOUNTER — Encounter: Payer: Self-pay | Admitting: Family Medicine

## 2020-02-13 VITALS — Wt 175.0 lb

## 2020-02-13 DIAGNOSIS — F339 Major depressive disorder, recurrent, unspecified: Secondary | ICD-10-CM | POA: Diagnosis not present

## 2020-02-13 DIAGNOSIS — R69 Illness, unspecified: Secondary | ICD-10-CM | POA: Diagnosis not present

## 2020-03-06 ENCOUNTER — Other Ambulatory Visit: Payer: Self-pay

## 2020-03-06 ENCOUNTER — Ambulatory Visit
Admission: RE | Admit: 2020-03-06 | Discharge: 2020-03-06 | Disposition: A | Discharge status: Home / Self Care | Payer: Medicare HMO | Source: Ambulatory Visit | Attending: Family Medicine | Admitting: Family Medicine

## 2020-03-06 ENCOUNTER — Other Ambulatory Visit: Payer: Medicare HMO

## 2020-03-06 DIAGNOSIS — Z1231 Encounter for screening mammogram for malignant neoplasm of breast: Secondary | ICD-10-CM

## 2020-03-26 NOTE — Progress Notes (Signed)
[place]   [phone] [phone]   Clinic Follow up Note   Patient Care Team: Girtha Rm, NP-C as PCP - General (Family Medicine) Irene Shipper, MD as Consulting Physician (Gastroenterology) Leighton Ruff, MD as Consulting Physician ([facility]) Truitt Merle, MD as Consulting Physician (Hematology) Kyung Rudd, MD as Consulting Physician (Radiation Oncology)  Date of Service:  03/28/2020  CHIEF COMPLAINT: F/u on colon cancer  SUMMARY OF ONCOLOGIC HISTORY: Oncology History Overview Note  Cancer Staging Rectal adenocarcinoma [facility]) Staging form: Colon and Rectum, AJCC 8th Edition - Clinical stage from 07/09/2016: Stage IIA (cT3, cN0, cM0) - Signed by Truitt Merle, MD on 08/16/2016     Rectal adenocarcinoma ([place])  07/09/2016 Pathology Results   Diagnosis Surgical [P], rectum mass - INVASIVE ADENOCARCINOMA. - SEE COMMENT.   07/09/2016 Procedure   Colonoscopy A non-obstructing large friable mass was found in the distal rectum. This began at approximately 1 cm above the anal verge and extended proximal for a distance of 8-10 cm. The lesion was bulky and involved approximately 75% of the luminal circumference. Multiple biopsies were taken. A few small-mouthed diverticula were found in the left colon. The exam was otherwise without abnormality on direct views. Retroflexion not performed purposely.   07/12/2016 Imaging   CT C/A/P with contrast IMPRESSION: Rectal wall thickening/mass, compatible with known rectal adenocarcinoma. No findings specific for metastatic disease in the chest, abdomen, or pelvis. 2.2 cm probable hemangioma in segment 4B, although poorly evaluated. Consider MRI abdomen with/ without multihance contrast for definitive characterization, as clinically warranted. Bilateral adrenal nodules measuring up to 1.6 cm on the right, likely reflecting benign adrenal adenomas, although technically indeterminate. If MRI abdomen is performed,  these can be definitively characterized at that time.   07/20/2016 Initial Diagnosis   Rectal adenocarcinoma ([place])   08/03/2016 Imaging   MRI AP W WO Contrast 08/03/16 IMPRESSION: 1. Large rectal mass measures 6.1 cm in length. No obstruction identified. This is compatible with at least a T3bN0M0 lesion. 2. No specific features highly suspicious for nodal metastasis or metastatic disease to the upper abdomen. 3. Indeterminate arterial phase enhancing lesion within the anterior dome of liver measures less than 1 cm. This may represent a benign liver lesions such as FNH or adenoma. Less favored with the hypervascular liver metastasis. Followup imaging at 6 months with repeat MRI of the liver is advise. 4. Bilateral adrenal adenomas   08/04/2016 - 09/16/2016 Radiation Therapy   Concurrent chemo radiation with Dr. [name]   08/04/2016 - 09/16/2016 Chemotherapy   Concurrent chemo radiation with Xeloda, $RemoveBefo'1500mg'AvuUhNjunFu$  twice daily   12/06/2016 Surgery   ULTRA LOW ANTERIOR RESECTION OF SIGMOID COLON AND RECTUM WITH COLOANAL ANASTAMOSIS AND LOOP ILEOSTOMY  by Dr. Drue Flirt at [place]  12/06/16   12/06/2016 Pathology Results   FINAL PATHOLOGIC DIAGNOSIS 12/06/16 MICROSCOPIC EXAMINATION AND DIAGNOSIS  A.ANAL CANAL, MUCOSECTOMY: Benign rectal and fibroadipose tissue. Negative for malignancy.  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 Mucinous tumor focally invades through the muscularis propria into pericolorectal tissue. Radial margin interpreted as involved by invasive carcinoma (invasive acellular mucin withrare viable tumor cells focally comes to within 0.2 mm [0.02 cm] of the inked radial soft tissue margin). Treatment effect present, predominantly invasive acellular mucin associated with  only very rare small groups of viable tumor cells (near complete response). 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 AJCC Pathologic Stage: ypT3 pN1a. See Cancer Case Summary.  C.POSTERIOR ANAL CANAL, EXCISION: Benign rectal tissue. Negative for malignancy.   01/26/2017 - 05/04/2017 Chemotherapy   Adjuvant FOLFOX q2 weeks, for 8 cycles         04/04/2017 Imaging   CT CAP IMPRESSION: New complete interval resolution of bulky rectal soft tissue mass since prior exam.  No evidence of local or distant metastatic disease.   06/13/2017 Surgery   CLOSURE OF LOOP ILEOSTOMY Dannielle Burn, MD [facility] 06/13/2017   10/19/2017 Imaging   CT CAP IMPRESSION: Status post low anterior section with reanastomosis. Prior diverting ileostomy has been reversed.  No findings suspicious for recurrent or metastatic disease.  Trace gas in the uterine fundus, nonspecific. While not distinctly visualized on this CT, this appearance can sometimes be related to colovaginal fistula.  Additional ancillary findings as above   03/28/2018 Surgery    Duodenal Bypass reversal on 03/28/18 at [facility]   10/02/2018 Imaging   CT CAP W Contrast  IMPRESSION: 1. Redemonstrated postoperative findings of rectal resection and reanastomosis. No evidence of recurrent mass or abnormal soft tissue in the pelvis.   2. No evidence of metastatic disease in the chest, abdomen, or pelvis.   3. There is a new, lobulated soft tissue nodule in the superficial left gluteal soft tissues measuring 3.0 x 2.2 cm (series 2, image 91). This is likely related to injection or trauma, and a very unlikely manifestation of metastatic disease. This can be further evaluated by ultrasound if desired.   4.  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 This can be further interrogated by fluoroscopy if desired.   5. Coronary artery disease. Aortic Atherosclerosis (ICD10-I70.0) and Emphysema (ICD10-J43.9).      CURRENT THERAPY:  Surveillance  INTERVAL HISTORY:  Tonya [name] is here for a follow up of colon cancer. She was last seen by me 6 months ago. She presents to the clinic alone. She notes she is stable. She notes her diarrhea is stable and still fasts when she goes out the house. So she tries to not leave as much as possible. She denies any other GI issues. She notes she is eating well and gaining weight.     REVIEW OF SYSTEMS:   Constitutional: Denies fevers, chills or abnormal weight loss Eyes: Denies blurriness of vision Ears, nose, mouth, throat, and face: Denies mucositis or sore throat Respiratory: Denies cough, dyspnea or wheezes Cardiovascular: Denies palpitation, chest discomfort or lower extremity swelling Gastrointestinal:  Denies nausea, heartburn or change in bowel habits Skin: Denies abnormal skin rashes Lymphatics: Denies new lymphadenopathy or easy bruising Neurological:Denies numbness, tingling or new weaknesses Behavioral/Psych: Mood is stable, no new changes  All other systems were reviewed with the patient and are negative.  MEDICAL HISTORY:  Past Medical History:  Diagnosis Date  . Anemia    few yrs ago  . C. difficile diarrhea 03/2018  . Cancer ([place])    rectal  . CHF (congestive heart failure) ([place])   . Coronary artery disease 07/2003   occluded LAD wtih right-to-left collaterals 07/24/03; no current cardiologist  . Diabetes mellitus without complication ([place])   . Elbow fracture, left   . History of chemotherapy 01/2018   radaition last tx jan 2019  . Myocardial infarction [facility]) age 36's  . Obesity 2003  s/p gastric bypass   . Vitamin D deficiency   . Wears glasses     SURGICAL  HISTORY: Past Surgical History:  Procedure Laterality Date  . ANAL RECTAL MANOMETRY N/A 11/01/2018   Procedure: ANO RECTAL MANOMETRY;  Surgeon: Leighton Ruff, MD;  Location: [facility];  Service: Endoscopy;  Laterality: N/A;  . CARDIAC CATHETERIZATION     prior to 2015  . CHOLECYSTECTOMY  1999  . GASTRIC BYPASS  2003  . IR FLUORO GUIDE PORT INSERTION RIGHT  01/25/2017  . IR REMOVAL TUN ACCESS W/ PORT W/O FL MOD SED  10/17/2018  . IR US GUIDE VASC ACCESS RIGHT  01/25/2017  . ORIF HUMERUS FRACTURE Left 08/02/2019   Procedure: OPEN REDUCTION INTERNAL FIXATION (ORIF) LEFT ELBOW FRACTURE.;  Surgeon: Altamese Patchogue, MD;  Location: [place];  Service: Orthopedics;  Laterality: Left;  . ostomy placed and removed  12/2017  . SACRAL NERVE STIMULATOR PLACEMENT  01-12-2019   DR Marcello Moores $RemoveBeforeD'@[facility]SC'ezMDZbcxskWnYB$    TEST PHASE    I have reviewed the social history and family history with the patient and they are unchanged from previous note.  ALLERGIES:  is allergic to bactrim [sulfamethoxazole-trimethoprim], phenergan [promethazine hcl], sulfa antibiotics, and penicillins.  MEDICATIONS:  Current Outpatient Medications  Medication Sig Dispense Refill  . acetaminophen (TYLENOL) 500 MG tablet Take 1,000 mg by mouth every 8 (eight) hours as needed for moderate pain.    Marland Kitchen aspirin 81 MG chewable tablet Chew 81 mg by mouth daily.    . Cholecalciferol (VITAMIN D3) 2000 units capsule Take 2,000 Units by mouth daily.     Marland Kitchen escitalopram (LEXAPRO) 10 MG tablet Take 1 tablet (10 mg total) by mouth daily. Take 1/2 tablet (5 mg) for the first week. 30 tablet 2  . ferrous sulfate (FERROUSUL) 325 (65 FE) MG tablet Take 1 tablet (325 mg total) by mouth daily with breakfast. 30 tablet 6  . Multiple Vitamin (MULTIVITAMIN) tablet Take 1 tablet by mouth daily.     No current facility-administered medications for this visit.   Facility-Administered Medications Ordered in Other Visits  Medication Dose Route Frequency Provider Last Rate Last  Admin  . Tbo-Filgrastim (GRANIX) injection 480 mcg  480 mcg Subcutaneous Once Truitt Merle, MD        PHYSICAL EXAMINATION: ECOG PERFORMANCE STATUS: 1 - Symptomatic but completely ambulatory  Vitals:   03/28/20 1356  BP: (!) 145/60  Pulse: 75  Resp: 17  Temp: 97.6 F (36.4 C)  SpO2: 99%   Filed Weights   03/28/20 1356  Weight: 178 lb (80.7 kg)    GENERAL:alert, no distress and comfortable SKIN: skin color, texture, turgor are normal, no rashes or significant lesions  EYES: normal, Conjunctiva are pink and non-injected, sclera clear  NECK: supple, thyroid normal size, non-tender, without nodularity LYMPH:  no palpable lymphadenopathy in the cervical, axillary  LUNGS: clear to auscultation and percussion with normal breathing effort HEART: regular rate & rhythm and no murmurs (+) lower extremity edema with skin erythema, stable.  ABDOMEN:abdomen soft, non-tender and normal bowel sounds Musculoskeletal:no cyanosis of digits and no clubbing  NEURO: alert & oriented x 3 with fluent speech, no focal motor/sensory deficits PATIENT DEFERRED RECTAL EXAM DUE TO INCONTINENCE   LABORATORY DATA:  I have reviewed the data as listed CBC Latest Ref Rng & Units 10/02/2019 09/05/2019 08/02/2019  WBC 4.0 - 10.5 K/uL 7.1 6.6 6.1  Hemoglobin 12.0 - 15.0 g/dL 12.2 12.0 10.4(L)  Hematocrit 36.0 - 46.0 % 37.6 36.0 34.7(L)  Platelets  150 - 400 K/uL 247 239 381     CMP Latest Ref Rng & Units 03/28/2020 10/02/2019 09/05/2019  Glucose 70 - 99 mg/dL 97 92 93  BUN 8 - 23 mg/dL $Remove'18 17 19  'fBqlgqX$ Creatinine 0.44 - 1.00 mg/dL 0.96 0.85 0.68  Sodium 135 - 145 mmol/L 143 139 141  Potassium 3.5 - 5.1 mmol/L 4.0 4.1 4.4  Chloride 98 - 111 mmol/L 106 105 103  CO2 22 - 32 mmol/L $RemoveB'26 28 24  'DpkpDLCR$ Calcium 8.9 - 10.3 mg/dL 9.4 9.7 9.5  Total Protein 6.5 - 8.1 g/dL 7.0 7.3 6.6  Total Bilirubin 0.3 - 1.2 mg/dL <0.2(L) 0.3 0.2  Alkaline Phos 38 - 126 U/L 64 67 68  AST 15 - 41 U/L $Remo'21 20 19  'izrGN$ ALT 0 - 44 U/L $Remo'24 22 16       'ECVsB$ RADIOGRAPHIC STUDIES: I have personally reviewed the radiological images as listed and agreed with the findings in the report. No results found.   ASSESSMENT & PLAN:  Tonya [name] is a 70 y.o. female with    1. Invasive low rectal adenocarcinoma, cT3N0M0, ypT3N1a, (+) margin, MMR normal  -Diagnosed in 2018. Completedneoadjuvantchemowith concurrentradiation, and surgery. Although she had excellent response to neoadjuvant chemoradiation, her residual tumor was still T3 and 1/12 node was positive, andradial margin was positive.She did complete 4 months adjuvant FOLFOX  -Currently on5 year surveillance. -She underwent Duodenal By[facility] her diarrhea and malnutritionwith only mild improvement. Her sacral nerve stimulator was unsuccessful in 01/2019.  -She is clinically stable. Labs reviewed, CBC and CMP WNL. Physical exam unremarkable, she deferred rectal exam due to incontinence. There is no clinical concern for recurrence.  -She is almost 4 years since her cancer diagnosis. Her risk of recurrence has significantly decreased. I do not plan to do more surveillance scan at this point. Will continue 5 year surveillance plan.  -F/u in 1 year.     2.Diarrhea andcompletestool incontinence, rectal soreness and skin irritation, Fatigue  -Started after herileostomy reversal surgery. Shehas repeatedlyrefused to have another colostomy bag -She has been seen byseveralcolorectal surgeonsand physicians. It is suspected this prolonged diarrhea is due to her gastric bypass. -She underwent Duodenal Bypass reversal on 03/28/18 at [facility] only mild improvement. She did have a bout of C. Diff after reversal.  -She tried Sacral nerve stimulator with Dr. Marcello Moores in 01/2019. This was unsuccessful and removed. (01/12/19-01/19/19) -Her chronic diarrhea is mostly stable. not changedand still impacting her life. She fasts when she leaves the  house so she stays home most of the time.    3. DM, HTN, Weight Gain  -No on medications, wellcontrolled -Since her Gastric bypass was reversed she has gained 50 pounds.  -Since better control of her diarrhea she has continues to gain weight.  -I discussed watching her diet with low sugar intake and increase exercise and activity level.   4. Depression -stable overall, she is depressed mainly due to herstool incontinenceand chronic diarrhea -She does not feel like living given how her diarrhea impacts her life. She denies having suicidal thoughts.  -stable    Plan -Lab and F/u in 1 year.     No problem-specific Assessment & Plan notes found for this encounter.   No orders of the defined types were placed in this encounter.  All questions were answered. The patient knows to call the clinic with any problems, questions or concerns. No barriers to learning was detected. The total time spent in  the appointment was 25 minutes.     Truitt Merle, MD 03/28/2020   I, Joslyn Devon, am acting as scribe for Truitt Merle, MD.   I have reviewed the above documentation for accuracy and completeness, and I agree with the above.

## 2020-03-28 ENCOUNTER — Inpatient Hospital Stay: Payer: Medicare HMO | Attending: Hematology

## 2020-03-28 ENCOUNTER — Encounter: Payer: Self-pay | Admitting: Hematology

## 2020-03-28 ENCOUNTER — Other Ambulatory Visit: Payer: Self-pay

## 2020-03-28 ENCOUNTER — Inpatient Hospital Stay: Payer: Medicare HMO | Admitting: Hematology

## 2020-03-28 VITALS — BP 145/60 | HR 75 | Temp 97.6°F | Resp 17 | Ht 61.25 in | Wt 178.0 lb

## 2020-03-28 DIAGNOSIS — Z79899 Other long term (current) drug therapy: Secondary | ICD-10-CM | POA: Diagnosis not present

## 2020-03-28 DIAGNOSIS — Z880 Allergy status to penicillin: Secondary | ICD-10-CM | POA: Diagnosis not present

## 2020-03-28 DIAGNOSIS — K909 Intestinal malabsorption, unspecified: Secondary | ICD-10-CM

## 2020-03-28 DIAGNOSIS — Z9884 Bariatric surgery status: Secondary | ICD-10-CM | POA: Insufficient documentation

## 2020-03-28 DIAGNOSIS — Z881 Allergy status to other antibiotic agents status: Secondary | ICD-10-CM | POA: Diagnosis not present

## 2020-03-28 DIAGNOSIS — R197 Diarrhea, unspecified: Secondary | ICD-10-CM

## 2020-03-28 DIAGNOSIS — I252 Old myocardial infarction: Secondary | ICD-10-CM | POA: Insufficient documentation

## 2020-03-28 DIAGNOSIS — I700 Atherosclerosis of aorta: Secondary | ICD-10-CM | POA: Insufficient documentation

## 2020-03-28 DIAGNOSIS — E119 Type 2 diabetes mellitus without complications: Secondary | ICD-10-CM | POA: Diagnosis not present

## 2020-03-28 DIAGNOSIS — Z888 Allergy status to other drugs, medicaments and biological substances status: Secondary | ICD-10-CM | POA: Insufficient documentation

## 2020-03-28 DIAGNOSIS — D3501 Benign neoplasm of right adrenal gland: Secondary | ICD-10-CM | POA: Insufficient documentation

## 2020-03-28 DIAGNOSIS — K529 Noninfective gastroenteritis and colitis, unspecified: Secondary | ICD-10-CM | POA: Insufficient documentation

## 2020-03-28 DIAGNOSIS — J439 Emphysema, unspecified: Secondary | ICD-10-CM | POA: Diagnosis not present

## 2020-03-28 DIAGNOSIS — C20 Malignant neoplasm of rectum: Secondary | ICD-10-CM | POA: Insufficient documentation

## 2020-03-28 DIAGNOSIS — D500 Iron deficiency anemia secondary to blood loss (chronic): Secondary | ICD-10-CM

## 2020-03-28 DIAGNOSIS — D3502 Benign neoplasm of left adrenal gland: Secondary | ICD-10-CM | POA: Diagnosis not present

## 2020-03-28 DIAGNOSIS — R635 Abnormal weight gain: Secondary | ICD-10-CM | POA: Diagnosis not present

## 2020-03-28 DIAGNOSIS — Z9049 Acquired absence of other specified parts of digestive tract: Secondary | ICD-10-CM | POA: Insufficient documentation

## 2020-03-28 DIAGNOSIS — Z933 Colostomy status: Secondary | ICD-10-CM | POA: Diagnosis not present

## 2020-03-28 DIAGNOSIS — Z882 Allergy status to sulfonamides status: Secondary | ICD-10-CM | POA: Insufficient documentation

## 2020-03-28 DIAGNOSIS — C787 Secondary malignant neoplasm of liver and intrahepatic bile duct: Secondary | ICD-10-CM | POA: Diagnosis not present

## 2020-03-28 LAB — CMP (CANCER CENTER ONLY)
ALT: 24 U/L (ref 0–44)
AST: 21 U/L (ref 15–41)
Albumin: 3.8 g/dL (ref 3.5–5.0)
Alkaline Phosphatase: 64 U/L (ref 38–126)
Anion gap: 11 (ref 5–15)
BUN: 18 mg/dL (ref 8–23)
CO2: 26 mmol/L (ref 22–32)
Calcium: 9.4 mg/dL (ref 8.9–10.3)
Chloride: 106 mmol/L (ref 98–111)
Creatinine: 0.96 mg/dL (ref 0.44–1.00)
GFR, Estimated: >60 mL/min (ref >60)
Glucose, Bld: 97 mg/dL (ref 70–99)
Potassium: 4 mmol/L (ref 3.5–5.1)
Sodium: 143 mmol/L (ref 135–145)
Total Bilirubin: <0.2 mg/dL — ABNORMAL LOW (ref 0.3–1.2)
Total Protein: 7 g/dL (ref 6.5–8.1)

## 2020-03-28 LAB — CEA (IN HOUSE-CHCC): CEA (CHCC-In House): 1.9 ng/mL (ref 0.00–5.00)

## 2020-03-30 ENCOUNTER — Encounter: Payer: Self-pay | Admitting: Hematology

## 2020-03-31 ENCOUNTER — Ambulatory Visit: Payer: Medicare HMO | Admitting: Hematology

## 2020-03-31 ENCOUNTER — Other Ambulatory Visit: Payer: Medicare HMO

## 2020-04-01 ENCOUNTER — Telehealth: Payer: Self-pay | Admitting: Hematology

## 2020-04-01 NOTE — Telephone Encounter (Signed)
Scheduled follow-up appointment per 3/25 los. Patient is aware.

## 2020-04-11 ENCOUNTER — Encounter: Payer: Self-pay | Admitting: Internal Medicine

## 2020-04-14 ENCOUNTER — Encounter: Payer: Self-pay | Admitting: Internal Medicine

## 2020-04-15 DIAGNOSIS — H43813 Vitreous degeneration, bilateral: Secondary | ICD-10-CM | POA: Diagnosis not present

## 2020-04-21 ENCOUNTER — Other Ambulatory Visit: Payer: Self-pay | Admitting: Family Medicine

## 2020-04-21 DIAGNOSIS — F339 Major depressive disorder, recurrent, unspecified: Secondary | ICD-10-CM

## 2020-04-22 ENCOUNTER — Other Ambulatory Visit: Payer: Self-pay | Admitting: Internal Medicine

## 2020-05-02 ENCOUNTER — Encounter: Payer: Self-pay | Admitting: Family Medicine

## 2020-05-14 ENCOUNTER — Encounter: Payer: Self-pay | Admitting: Family Medicine

## 2020-05-15 ENCOUNTER — Encounter: Payer: Self-pay | Admitting: Hematology

## 2020-05-28 ENCOUNTER — Ambulatory Visit (INDEPENDENT_AMBULATORY_CARE_PROVIDER_SITE_OTHER): Payer: Medicare HMO | Admitting: Family Medicine

## 2020-05-28 ENCOUNTER — Encounter: Payer: Self-pay | Admitting: Family Medicine

## 2020-05-28 ENCOUNTER — Other Ambulatory Visit: Payer: Self-pay

## 2020-05-28 VITALS — BP 120/72 | HR 66 | Wt 178.8 lb

## 2020-05-28 DIAGNOSIS — Z9889 Other specified postprocedural states: Secondary | ICD-10-CM | POA: Diagnosis not present

## 2020-05-28 DIAGNOSIS — Z9181 History of falling: Secondary | ICD-10-CM

## 2020-05-28 DIAGNOSIS — R159 Full incontinence of feces: Secondary | ICD-10-CM | POA: Diagnosis not present

## 2020-05-28 DIAGNOSIS — K529 Noninfective gastroenteritis and colitis, unspecified: Secondary | ICD-10-CM | POA: Diagnosis not present

## 2020-05-28 NOTE — Progress Notes (Signed)
   Subjective:    Patient ID: Tonya [name], female    DOB: November 09, 1950, 70 y.o.   MRN: [id number]  HPI Chief Complaint  Patient presents with  . handicap    Wants handicap placard, falling a lot. Can't use left arm or hand. Unsteady. Chronic diarrhea   She is here requesting handicap placard.  States she has had 1 in the past due to chronic diarrhea and fecal incontinence related to rectal cancer.  States prior to 2 months ago she was having recurrent falls.  States she has not fallen in 2 months since having cataract surgery.  She feels strongly that her falls were related to limited visual acuity.  States she did not have dizziness, chest pain, palpitations, shortness of breath or any other symptoms that would cause her to fall.  Denies lower extremities giving way.  Surgery on left elbow last year from fall.   She has limited mobility of her left arm due to left elbow injury and surgery.  Denies fever, chills, headache, dizziness, chest pain, palpitation, shortness of breath, abdominal pain, nausea, vomiting or urinary symptoms.   Review of Systems Pertinent positives and negatives in the history of present illness.     Objective:   Physical Exam BP 120/72   Pulse 66   Wt 178 lb 12.8 oz (81.1 kg)   BMI 33.51 kg/m   Alert and oriented in no acute distress.  Respirations unlabored.  Normal speech, mood and thought process.      Assessment & Plan:  Chronic diarrhea  Incontinence of feces, unspecified fecal incontinence type  History of fall  History of surgery on arm  Handicap placard filled out and given to patient.  She has not fallen since cataract surgery and is quite confident her limited visual acuity prior to cataract removal was the reason for her falls.  She will let me know if she does have another fall.

## 2020-06-09 ENCOUNTER — Encounter (INDEPENDENT_AMBULATORY_CARE_PROVIDER_SITE_OTHER): Payer: Self-pay

## 2020-06-13 ENCOUNTER — Encounter: Payer: Self-pay | Admitting: Family Medicine

## 2020-06-18 ENCOUNTER — Other Ambulatory Visit: Payer: Self-pay | Admitting: Family Medicine

## 2020-06-18 DIAGNOSIS — F339 Major depressive disorder, recurrent, unspecified: Secondary | ICD-10-CM

## 2020-06-19 DIAGNOSIS — L304 Erythema intertrigo: Secondary | ICD-10-CM | POA: Diagnosis not present

## 2020-06-19 DIAGNOSIS — Z85828 Personal history of other malignant neoplasm of skin: Secondary | ICD-10-CM | POA: Diagnosis not present

## 2020-06-19 DIAGNOSIS — L905 Scar conditions and fibrosis of skin: Secondary | ICD-10-CM | POA: Diagnosis not present

## 2020-07-21 DIAGNOSIS — Z85048 Personal history of other malignant neoplasm of rectum, rectosigmoid junction, and anus: Secondary | ICD-10-CM | POA: Diagnosis not present

## 2020-07-21 DIAGNOSIS — R159 Full incontinence of feces: Secondary | ICD-10-CM | POA: Diagnosis not present

## 2020-07-21 DIAGNOSIS — R197 Diarrhea, unspecified: Secondary | ICD-10-CM | POA: Diagnosis not present

## 2020-07-22 DIAGNOSIS — R197 Diarrhea, unspecified: Secondary | ICD-10-CM | POA: Diagnosis not present

## 2020-07-30 ENCOUNTER — Other Ambulatory Visit: Payer: Self-pay

## 2020-07-30 ENCOUNTER — Encounter: Payer: Self-pay | Admitting: Internal Medicine

## 2020-07-30 ENCOUNTER — Ambulatory Visit (INDEPENDENT_AMBULATORY_CARE_PROVIDER_SITE_OTHER): Payer: Medicare HMO | Admitting: Internal Medicine

## 2020-07-30 VITALS — BP 168/86 | HR 83 | Temp 98.4°F | Resp 16 | Ht 61.25 in | Wt 186.0 lb

## 2020-07-30 DIAGNOSIS — R9431 Abnormal electrocardiogram [ECG] [EKG]: Secondary | ICD-10-CM | POA: Diagnosis not present

## 2020-07-30 DIAGNOSIS — E785 Hyperlipidemia, unspecified: Secondary | ICD-10-CM

## 2020-07-30 DIAGNOSIS — I2510 Atherosclerotic heart disease of native coronary artery without angina pectoris: Secondary | ICD-10-CM | POA: Diagnosis not present

## 2020-07-30 DIAGNOSIS — I110 Hypertensive heart disease with heart failure: Secondary | ICD-10-CM | POA: Diagnosis not present

## 2020-07-30 DIAGNOSIS — I5033 Acute on chronic diastolic (congestive) heart failure: Secondary | ICD-10-CM | POA: Insufficient documentation

## 2020-07-30 DIAGNOSIS — I10 Essential (primary) hypertension: Secondary | ICD-10-CM

## 2020-07-30 DIAGNOSIS — I872 Venous insufficiency (chronic) (peripheral): Secondary | ICD-10-CM | POA: Diagnosis not present

## 2020-07-30 DIAGNOSIS — R600 Localized edema: Secondary | ICD-10-CM | POA: Diagnosis not present

## 2020-07-30 HISTORY — DX: Essential (primary) hypertension: I10

## 2020-07-30 LAB — URINALYSIS, ROUTINE W REFLEX MICROSCOPIC
Nitrite: NEGATIVE
Specific Gravity, Urine: 1.02 (ref 1.000–1.030)
Total Protein, Urine: NEGATIVE
Urine Glucose: NEGATIVE
Urobilinogen, UA: 0.2 (ref 0.0–1.0)
pH: 6.5 (ref 5.0–8.0)

## 2020-07-30 LAB — CBC WITH DIFFERENTIAL/PLATELET
Basophils Absolute: 0 10*3/uL (ref 0.0–0.1)
Basophils Relative: 0.3 % (ref 0.0–3.0)
Eosinophils Absolute: 0.1 10*3/uL (ref 0.0–0.7)
Eosinophils Relative: 0.8 % (ref 0.0–5.0)
HCT: 37 % (ref 36.0–46.0)
Hemoglobin: 12 g/dL (ref 12.0–15.0)
Lymphocytes Relative: 10.6 % — ABNORMAL LOW (ref 12.0–46.0)
Lymphs Abs: 0.7 10*3/uL (ref 0.7–4.0)
MCHC: 32.3 g/dL (ref 30.0–36.0)
MCV: 93.8 fl (ref 78.0–100.0)
Monocytes Absolute: 0.6 10*3/uL (ref 0.1–1.0)
Monocytes Relative: 9.3 % (ref 3.0–12.0)
Neutro Abs: 5.1 10*3/uL (ref 1.4–7.7)
Neutrophils Relative %: 79 % — ABNORMAL HIGH (ref 43.0–77.0)
Platelets: 258 10*3/uL (ref 150.0–400.0)
RBC: 3.95 Mil/uL (ref 3.87–5.11)
RDW: 14 % (ref 11.5–15.5)
WBC: 6.4 10*3/uL (ref 4.0–10.5)

## 2020-07-30 LAB — HEPATIC FUNCTION PANEL
ALT: 21 U/L (ref 0–35)
AST: 22 U/L (ref 0–37)
Albumin: 4 g/dL (ref 3.5–5.2)
Alkaline Phosphatase: 67 U/L (ref 39–117)
Bilirubin, Direct: 0.1 mg/dL (ref 0.0–0.3)
Total Bilirubin: 0.4 mg/dL (ref 0.2–1.2)
Total Protein: 7 g/dL (ref 6.0–8.3)

## 2020-07-30 LAB — BASIC METABOLIC PANEL
BUN: 14 mg/dL (ref 6–23)
CO2: 28 mEq/L (ref 19–32)
Calcium: 9.5 mg/dL (ref 8.4–10.5)
Chloride: 102 mEq/L (ref 96–112)
Creatinine, Ser: 0.74 mg/dL (ref 0.40–1.20)
GFR: 83.25 mL/min (ref >60.00)
Glucose, Bld: 90 mg/dL (ref 70–99)
Potassium: 4.4 mEq/L (ref 3.5–5.1)
Sodium: 139 mEq/L (ref 135–145)

## 2020-07-30 LAB — TROPONIN I (HIGH SENSITIVITY): High Sens Troponin I: 12 ng/L (ref 2–17)

## 2020-07-30 LAB — BRAIN NATRIURETIC PEPTIDE: Pro B Natriuretic peptide (BNP): 22 pg/mL (ref 0.0–100.0)

## 2020-07-30 MED ORDER — OLMESARTAN MEDOXOMIL 20 MG PO TABS: 20.0000 mg | ORAL_TABLET | Freq: Every day | ORAL | 0 refills | Status: DC

## 2020-07-30 MED ORDER — ROSUVASTATIN CALCIUM 5 MG PO TABS: 5.0000 mg | ORAL_TABLET | Freq: Every day | ORAL | 1 refills | Status: DC

## 2020-07-30 MED ORDER — CLOBETASOL PROPIONATE 0.05 % EX OINT: 1.0000 "application " | TOPICAL_OINTMENT | Freq: Two times a day (BID) | CUTANEOUS | 1 refills | Status: DC

## 2020-07-30 MED ORDER — INDAPAMIDE 2.5 MG PO TABS: 2.5000 mg | ORAL_TABLET | Freq: Every day | ORAL | 0 refills | Status: DC

## 2020-07-30 NOTE — Progress Notes (Signed)
Subjective:  Patient ID: Tonya [name], female    DOB: 23-Jun-1950  Age: 70 y.o. MRN: [id number]  CC: New Patient (Initial Visit)  This visit occurred during the SARS-CoV-2 public health emergency.  Safety protocols were in place, including screening questions prior to the visit, additional usage of staff PPE, and extensive cleaning of exam room while observing appropriate contact time as indicated for disinfecting solutions.    HPI Tonya [name] [facility] presents for establishing.  She complains of a 3-week history of painless lower extremity edema without claudication.  She is also noted weight gain and a heaviness in her chest.  She has had mild dyspnea on exertion.  She is taking Motrin for musculoskeletal pain.  She has a vague history of heart disease dating back to 70 years of age.  History [name] has a past medical history of Anemia, C. difficile diarrhea (03/2018), Cancer ([place]), CHF (congestive heart failure) ([place]), Coronary artery disease (07/2003), Diabetes mellitus without complication ([place]), Elbow fracture, left, History of chemotherapy (01/2018), Myocardial infarction [facility]) (age 33's), Obesity (2003), Primary hypertension (07/30/2020), Vitamin D deficiency, and Wears glasses.   She has a past surgical history that includes Gastric bypass (2003); Cholecystectomy (1999); IR FLUORO GUIDE PORT INSERTION RIGHT (01/25/2017); IR US Guide Vasc Access Right (01/25/2017); IR REMOVAL TUN ACCESS W/ PORT W/O FL MOD SED (10/17/2018); Anal Rectal manometry (N/A, 11/01/2018); ostomy placed and removed (12/2017); Sacral nerve stimulator placement (01-12-2019   DR Marcello Moores '@[facility] ); Cardiac catheterization; and ORIF humerus fracture (Left, 08/02/2019).   Her family history includes Cancer in her maternal uncle; Clotting disorder in her mother; Colon cancer (age of onset: 59) in her cousin; Heart attack in her father and mother; Kidney disease in her mother; Liver disease in her mother; Prostate cancer  (age of onset: 57) in her father; Throat cancer (age of onset: 63) in her mother; Thyroid cancer (age of onset: 31) in her sister; Uterine cancer (age of onset: 70) in her sister; Uterine cancer (age of onset: 80) in her sister.She reports that she has never smoked. She has never used smokeless tobacco. She reports that she does not drink alcohol and does not use drugs.  Outpatient Medications Prior to Visit  Medication Sig Dispense Refill   acetaminophen (TYLENOL) 500 MG tablet Take 1,000 mg by mouth every 8 (eight) hours as needed for moderate pain.     Cholecalciferol (VITAMIN D3) 2000 units capsule Take 2,000 Units by mouth daily.      escitalopram (LEXAPRO) 10 MG tablet take 1/2 tablet by mouth daily for the first week, then increase to 1 tablet daily 30 tablet 2   metroNIDAZOLE (FLAGYL) 500 MG tablet Take by mouth.     Multiple Vitamin (MULTIVITAMIN) tablet Take 1 tablet by mouth daily.     aspirin 81 MG chewable tablet Chew 81 mg by mouth daily.     ferrous sulfate (FERROUSUL) 325 (65 FE) MG tablet Take 1 tablet (325 mg total) by mouth daily with breakfast. 30 tablet 6   nystatin cream (MYCOSTATIN) SMARTSIG:Sparingly Topical Daily (Patient not taking: Reported on 07/30/2020)     hydrocortisone 2.5 % ointment SMARTSIG:Sparingly Topical Daily (Patient not taking: Reported on 07/30/2020)     Tbo-Filgrastim (GRANIX) injection 480 mcg      No facility-administered medications prior to visit.    ROS Review of Systems  Constitutional:  Positive for unexpected weight change. Negative for diaphoresis and fatigue.  HENT:  Negative for trouble swallowing.   Respiratory:  Positive for  shortness of breath. Negative for cough, chest tightness and wheezing.   Cardiovascular:  Positive for chest pain and leg swelling. Negative for palpitations.  Gastrointestinal:  Negative for abdominal pain, constipation, diarrhea and nausea.  Musculoskeletal: Negative.   Skin: Negative.   Neurological:  Negative  for facial asymmetry and light-headedness.  Hematological: Negative.  Negative for adenopathy. Does not bruise/bleed easily.  Psychiatric/Behavioral: Negative.     Objective:  BP (!) 168/86 (BP Location: Right Arm, Patient Position: Sitting, Cuff Size: Large)   Pulse 83   Temp 98.4 F (36.9 C) (Oral)   Resp 16   Ht 5' 1.25" (1.556 m)   Wt 186 lb (84.4 kg)   SpO2 100%   BMI 34.86 kg/m   Physical Exam Vitals reviewed.  Constitutional:      Appearance: Normal appearance.  HENT:     Nose: Nose normal.     Mouth/Throat:     Mouth: Mucous membranes are moist.  Eyes:     Conjunctiva/sclera: Conjunctivae normal.  Cardiovascular:     Rate and Rhythm: Normal rate and regular rhythm.     Heart sounds: Normal heart sounds, S1 normal and S2 normal.    No friction rub. No gallop.     Comments: EKG- NSR, 72 bpm Low voltage is not new TWI/flattening in V1-V4 is new No LVH or Q waves Pulmonary:     Breath sounds: No stridor. No wheezing, rhonchi or rales.  Abdominal:     General: Abdomen is protuberant. Bowel sounds are normal. There is no distension.     Palpations: Abdomen is soft. There is no hepatomegaly, splenomegaly or mass.     Tenderness: There is no abdominal tenderness. There is no guarding.  Musculoskeletal:        General: No swelling or deformity.     Cervical back: Neck supple.     Right lower leg: Edema present.     Left lower leg: Edema present.  Skin:    General: Skin is warm.     Coloration: Skin is not jaundiced.     Findings: Erythema and rash present.     Comments: Bilateral lower extremities there is 2+ pitting edema.  Over the anterior aspects there are large swaths of erythema and faint scaling.  There are no wounds or exudates.  There is no warmth, streaking, induration, purulence or fluctuant areas.  See photo.  Neurological:     General: No focal deficit present.     Mental Status: She is alert.     Lab Results  Component Value Date   WBC 6.4  07/30/2020   HGB 12.0 07/30/2020   HCT 37.0 07/30/2020   PLT 258.0 07/30/2020   GLUCOSE 90 07/30/2020   CHOL 188 09/05/2019   TRIG 147 09/05/2019   HDL 67 09/05/2019   LDLCALC 96 09/05/2019   ALT 21 07/30/2020   AST 22 07/30/2020   NA 139 07/30/2020   K 4.4 07/30/2020   CL 102 07/30/2020   CREATININE 0.74 07/30/2020   BUN 14 07/30/2020   CO2 28 07/30/2020   TSH 2.58 07/30/2020   INR 1.0 08/02/2019   HGBA1C 5.3 09/05/2019    Study Conclusions 2015  Left ventricle: The cavity size was normal. Wall thickness  was increased in a pattern of mild LVH. Systolic function  was normal. The estimated ejection fraction was in the range  of 55% to 60%. Wall motion was normal; there were no  regional wall motion abnormalities. Doppler parameters are  consistent  with abnormal left ventricular relaxation (grade  1 diastolic dysfunction).              Transthoracic  echocardiography.  M-mode, complete 2D, spectral Doppler,  and color Doppler.  Height:  Height: 154.9cm. Height: 61in.  Weight:  Weight: 80.7kg. Weight: 177.6lb.  Body mass index:  BMI: 33.6kg/m^2.  Body surface area:    BSA: 1.33m2.  Blood  pressure:     124/69.  Patient status:  Inpatient.  Location:  Bedside.  Assessment & Plan:   CAzaryawas seen today for new patient (initial visit).  Diagnoses and all orders for this visit:  Bilateral leg edema- She has new onset lower extremity edema.  Her labs are reassuring.  She has EKG changes. Will start a thiazide diuretic and will screen for CHF and ischemia with an MPI. -     EKG 12-Lead -     CBC with Differential/Platelet; Future -     Brain natriuretic peptide; Future -     Basic metabolic panel; Future -     Troponin I (High Sensitivity); Future -     Urinalysis, Routine w reflex microscopic; Future -     Thyroid Panel With TSH; Future -     Hepatic function panel; Future -     D-dimer, quantitative; Future -     D-dimer, quantitative -     Hepatic function panel -      Thyroid Panel With TSH -     Urinalysis, Routine w reflex microscopic -     Troponin I (High Sensitivity) -     Basic metabolic panel -     Brain natriuretic peptide -     CBC with Differential/Platelet -     MYOCARDIAL PERFUSION IMAGING; Future -     indapamide (LOZOL) 2.5 MG tablet; Take 1 tablet (2.5 mg total) by mouth daily.  Atherosclerosis of native coronary artery of native heart without angina pectoris -     EKG 12-Lead -     Troponin I (High Sensitivity); Future -     Troponin I (High Sensitivity) -     MYOCARDIAL PERFUSION IMAGING; Future -     rosuvastatin (CRESTOR) 5 MG tablet; Take 1 tablet (5 mg total) by mouth daily. -     aspirin 81 MG chewable tablet; Chew 1 tablet (81 mg total) by mouth daily.  Primary hypertension -     EKG 12-Lead -     CBC with Differential/Platelet; Future -     Basic metabolic panel; Future -     Urinalysis, Routine w reflex microscopic; Future -     Thyroid Panel With TSH; Future -     Thyroid Panel With TSH -     Urinalysis, Routine w reflex microscopic -     Basic metabolic panel -     CBC with Differential/Platelet -     olmesartan (BENICAR) 20 MG tablet; Take 1 tablet (20 mg total) by mouth daily.  LVH (left ventricular hypertrophy) due to hypertensive disease, with heart failure (HCC) -     MYOCARDIAL PERFUSION IMAGING; Future -     indapamide (LOZOL) 2.5 MG tablet; Take 1 tablet (2.5 mg total) by mouth daily. -     olmesartan (BENICAR) 20 MG tablet; Take 1 tablet (20 mg total) by mouth daily.  Acute on chronic heart failure with preserved ejection fraction (HCC) -     MYOCARDIAL PERFUSION IMAGING; Future -     indapamide (LOZOL) 2.5 MG tablet; Take  1 tablet (2.5 mg total) by mouth daily.  Abnormal electrocardiogram (ECG) (EKG) -     MYOCARDIAL PERFUSION IMAGING; Future  Acute venous stasis dermatitis -     clobetasol ointment (TEMOVATE) 0.05 %; Apply 1 application topically 2 (two) times daily.  Abnormal electrocardiogram -      MYOCARDIAL PERFUSION IMAGING; Future  Hyperlipidemia LDL goal <70 -     rosuvastatin (CRESTOR) 5 MG tablet; Take 1 tablet (5 mg total) by mouth daily.  I have discontinued Arbie Cookey A. Burnette's ferrous sulfate and hydrocortisone. I have also changed her aspirin. Additionally, I am having her start on clobetasol ointment, indapamide, olmesartan, and rosuvastatin. Lastly, I am having her maintain her multivitamin, Vitamin D3, acetaminophen, escitalopram, metroNIDAZOLE, and nystatin cream. We will stop administering Tbo-Filgrastim.  Meds ordered this encounter  Medications   clobetasol ointment (TEMOVATE) 0.05 %    Sig: Apply 1 application topically 2 (two) times daily.    Dispense:  45 g    Refill:  1   indapamide (LOZOL) 2.5 MG tablet    Sig: Take 1 tablet (2.5 mg total) by mouth daily.    Dispense:  90 tablet    Refill:  0   olmesartan (BENICAR) 20 MG tablet    Sig: Take 1 tablet (20 mg total) by mouth daily.    Dispense:  90 tablet    Refill:  0   rosuvastatin (CRESTOR) 5 MG tablet    Sig: Take 1 tablet (5 mg total) by mouth daily.    Dispense:  90 tablet    Refill:  1   aspirin 81 MG chewable tablet    Sig: Chew 1 tablet (81 mg total) by mouth daily.    Dispense:  90 tablet    Refill:  1      Follow-up: Return in about 3 weeks (around 08/20/2020).   Scarlette Calico, MD

## 2020-07-30 NOTE — Patient Instructions (Signed)

## 2020-07-31 LAB — D-DIMER, QUANTITATIVE: D-Dimer, Quant: 0.56 mcg/mL FEU — ABNORMAL HIGH (ref <0.50)

## 2020-07-31 LAB — THYROID PANEL WITH TSH
Free Thyroxine Index: 2.6 (ref 1.4–3.8)
T3 Uptake: 27 % (ref 22–35)
T4, Total: 9.7 ug/dL (ref 5.1–11.9)
TSH: 2.58 mIU/L (ref 0.40–4.50)

## 2020-07-31 MED ORDER — ASPIRIN 81 MG PO CHEW
81.0000 mg | CHEWABLE_TABLET | Freq: Every day | ORAL | 1 refills | Status: AC
Start: 2020-07-31 — End: ?

## 2020-08-07 ENCOUNTER — Telehealth (HOSPITAL_COMMUNITY): Payer: Self-pay | Admitting: *Deleted

## 2020-08-07 NOTE — Telephone Encounter (Signed)
Close encounter 

## 2020-08-08 ENCOUNTER — Ambulatory Visit (HOSPITAL_COMMUNITY)
Admission: RE | Admit: 2020-08-08 | Discharge: 2020-08-08 | Disposition: A | Discharge status: Home / Self Care | Payer: Medicare HMO | Source: Ambulatory Visit | Attending: Cardiovascular Disease | Admitting: Cardiovascular Disease

## 2020-08-08 ENCOUNTER — Other Ambulatory Visit: Payer: Self-pay

## 2020-08-08 DIAGNOSIS — I110 Hypertensive heart disease with heart failure: Secondary | ICD-10-CM | POA: Insufficient documentation

## 2020-08-08 DIAGNOSIS — R9431 Abnormal electrocardiogram [ECG] [EKG]: Secondary | ICD-10-CM | POA: Insufficient documentation

## 2020-08-08 DIAGNOSIS — I2510 Atherosclerotic heart disease of native coronary artery without angina pectoris: Secondary | ICD-10-CM

## 2020-08-08 DIAGNOSIS — R600 Localized edema: Secondary | ICD-10-CM | POA: Diagnosis not present

## 2020-08-08 DIAGNOSIS — I5033 Acute on chronic diastolic (congestive) heart failure: Secondary | ICD-10-CM | POA: Diagnosis not present

## 2020-08-08 LAB — MYOCARDIAL PERFUSION IMAGING
LV dias vol: 113 mL (ref 46–106)
LV sys vol: 57 mL
Peak HR: 113 {beats}/min
Rest HR: 67 {beats}/min
SDS: 3
SRS: 5
SSS: 8
TID: 1.07

## 2020-08-08 MED ORDER — TECHNETIUM TC 99M TETROFOSMIN IV KIT
10.3000 | PACK | Freq: Once | INTRAVENOUS | Status: AC | PRN
  Administered 2020-08-08: 10.3 via INTRAVENOUS
  Filled 2020-08-08: qty 11

## 2020-08-08 MED ORDER — REGADENOSON 0.4 MG/5ML IV SOLN
0.4000 mg | Freq: Once | INTRAVENOUS | Status: AC
  Administered 2020-08-08: 0.4 mg via INTRAVENOUS

## 2020-08-08 MED ORDER — TECHNETIUM TC 99M TETROFOSMIN IV KIT
30.8000 | PACK | Freq: Once | INTRAVENOUS | Status: AC | PRN
  Administered 2020-08-08: 30.8 via INTRAVENOUS
  Filled 2020-08-08: qty 31

## 2020-08-09 ENCOUNTER — Other Ambulatory Visit: Payer: Self-pay | Admitting: Internal Medicine

## 2020-08-09 DIAGNOSIS — I5033 Acute on chronic diastolic (congestive) heart failure: Secondary | ICD-10-CM | POA: Insufficient documentation

## 2020-08-09 DIAGNOSIS — I25118 Atherosclerotic heart disease of native coronary artery with other forms of angina pectoris: Secondary | ICD-10-CM

## 2020-08-09 DIAGNOSIS — I2102 ST elevation (STEMI) myocardial infarction involving left anterior descending coronary artery: Secondary | ICD-10-CM | POA: Insufficient documentation

## 2020-08-09 MED ORDER — NITROGLYCERIN 0.2 MG/HR TD PT24: 0.2000 mg | MEDICATED_PATCH | Freq: Every day | TRANSDERMAL | 3 refills | Status: DC

## 2020-08-11 ENCOUNTER — Encounter: Payer: Self-pay | Admitting: Internal Medicine

## 2020-08-12 ENCOUNTER — Other Ambulatory Visit: Payer: Medicare HMO

## 2020-08-12 ENCOUNTER — Encounter: Payer: Self-pay | Admitting: Hematology

## 2020-08-13 ENCOUNTER — Ambulatory Visit: Payer: Medicare HMO | Admitting: Cardiology

## 2020-08-13 ENCOUNTER — Encounter: Payer: Self-pay | Admitting: Cardiology

## 2020-08-13 ENCOUNTER — Other Ambulatory Visit: Payer: Self-pay

## 2020-08-13 ENCOUNTER — Encounter: Payer: Self-pay | Admitting: Internal Medicine

## 2020-08-13 VITALS — BP 124/77 | HR 74 | Temp 98.6°F | Resp 17 | Ht 61.0 in | Wt 181.0 lb

## 2020-08-13 DIAGNOSIS — I25118 Atherosclerotic heart disease of native coronary artery with other forms of angina pectoris: Secondary | ICD-10-CM | POA: Diagnosis not present

## 2020-08-13 DIAGNOSIS — R0600 Dyspnea, unspecified: Secondary | ICD-10-CM

## 2020-08-13 DIAGNOSIS — I10 Essential (primary) hypertension: Secondary | ICD-10-CM

## 2020-08-13 DIAGNOSIS — E785 Hyperlipidemia, unspecified: Secondary | ICD-10-CM | POA: Diagnosis not present

## 2020-08-13 DIAGNOSIS — R0609 Other forms of dyspnea: Secondary | ICD-10-CM

## 2020-08-13 DIAGNOSIS — R9439 Abnormal result of other cardiovascular function study: Secondary | ICD-10-CM

## 2020-08-13 DIAGNOSIS — I2510 Atherosclerotic heart disease of native coronary artery without angina pectoris: Secondary | ICD-10-CM

## 2020-08-13 MED ORDER — ROSUVASTATIN CALCIUM 20 MG PO TABS: 20.0000 mg | ORAL_TABLET | Freq: Every day | ORAL | 2 refills | Status: DC

## 2020-08-13 NOTE — H&P (View-Only) (Signed)
Primary Physician/Referring:  [name], MD  Patient ID: Tonya [name], female    DOB: [birth year], 70 y.o.   MRN: [id number]  Chief Complaint  Patient presents with   Congestive Heart Failure   MI   Myocardial Infartion   Coronary Artery Disease   New Patient (Initial Visit)    Referred by Dr. Scarlette Calico   HPI:    Tonya [name]  is a 70 y.o. caucasian female with a past medical history significant for coronary atherosclerosis, hypertension, heart failure, myocardial infarction involving the LAD date unknown, hyperlipidemia, obesity, rectal adenocarcinoma s/p resection, and gastric bypass. 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 She presents today by referral from her PCP Dr. Scarlette Calico for evaluation of chest pain.   The patient describes that "she just doesn't feel right." For the last month, she has been experiencing daily chest discomfort that is described as a "heaviness" on her chest with no radiation. She describes that the episodes last from 10 minutes to 1 hour and occur at random with no specific correlation with rest or activity. The chest discomfort is associated with worsening shortness of breath described as "heavy breathing". She describes increased DOE with climbing the stairs with her laundry at her condominium. She also describes that she experiences shortness of breath in the night that improves when she transitions from the couch to the recliner, suggesting orthopnea/PND. She denies any limitations in her ability to perform her daily household chores. The patient is wearing a nitroglycerin patch but she states that this has not provided relief of any of her symptoms. The patient also describes recent weight gain despite decreased appetite.  She has history of MI listed in her history but she never recalls MI symptoms ever occurring to her. She states that she was told that it likely occurred in her 41's and "bypassed  itself".  Her physical activity includes housework, going to the grocery store, and playing with her dog. Her activity is limited by her lack of anal sphincter tone.   The patient denies any history of smoking or drinking and recent falls.  Past Medical History:  Diagnosis Date   Anemia    few yrs ago   C. difficile diarrhea 03/2018   Cancer ([place])    rectal   CHF (congestive heart failure) ([place])    Coronary artery disease 07/2003   occluded LAD wtih right-to-left collaterals 07/24/03; no current cardiologist   Diabetes mellitus without complication ([place])    Elbow fracture, left    History of chemotherapy 01/2018   radaition last tx jan 2019   Myocardial infarction [facility]) age 70's   Obesity 2003   s/p gastric bypass    Primary hypertension 07/30/2020   Vitamin D deficiency    Wears glasses    Past Surgical History:  Procedure Laterality Date   ANAL RECTAL MANOMETRY N/A 11/01/2018   Procedure: ANO RECTAL MANOMETRY;  Surgeon: Leighton Ruff, MD;  Location: [facility];  Service: Endoscopy;  Laterality: N/A;   CARDIAC CATHETERIZATION     prior to [place]   GASTRIC BYPASS  2003   IR FLUORO GUIDE PORT INSERTION RIGHT  01/25/2017   IR REMOVAL TUN ACCESS W/ PORT W/O FL MOD SED  10/17/2018   IR US GUIDE VASC ACCESS RIGHT  01/25/2017   ORIF HUMERUS FRACTURE Left 08/02/2019   Procedure: OPEN REDUCTION INTERNAL FIXATION (ORIF) LEFT ELBOW FRACTURE.;  Surgeon:  Altamese Plantation, MD;  Location: [place];  Service: Orthopedics;  Laterality: Left;   ostomy placed and removed  12/2017   SACRAL NERVE STIMULATOR PLACEMENT  01-12-2019   DR Marcello Moores '@[email or web address]'$    TEST PHASE   Family History  Problem Relation Age of Onset   Heart attack Mother        d.93   Throat cancer Mother 50   Clotting disorder Mother    Liver disease Mother    Kidney disease Mother    Heart attack Father        d.92   Prostate cancer Father 53   Uterine cancer Sister 42       treated with total hysterectomy  and radiation   Thyroid cancer Sister 23       treated with thyroidectomy   Uterine cancer Sister 62   Cancer Maternal Uncle        d.80s unspecified type of cancer. History of smoking.   Colon cancer Cousin 60       paternal first-cousin    Social History   Tobacco Use   Smoking status: Never   Smokeless tobacco: Never  Substance Use Topics   Alcohol use: No   Marital Status: Single  ROS  Review of Systems  Constitutional: Positive for decreased appetite and weight gain.  Cardiovascular:  Positive for chest pain (heaviness), claudication (with going up stairs), dyspnea on exertion, leg swelling and orthopnea. Negative for near-syncope, palpitations and syncope.  Respiratory:  Positive for cough (feels like something is in her throat), shortness of breath and sleep disturbances due to breathing.   Skin:  Positive for color change (in anterior lower legs).  Gastrointestinal:  Positive for diarrhea (hx of rectal cancer).  Neurological:  Positive for difficulty with concentration, dizziness and light-headedness.  Psychiatric/Behavioral:  The patient is nervous/anxious.   Objective  Blood pressure 124/77, pulse 74, temperature 98.6 F (37 C), temperature source Temporal, resp. rate 17, height '5\' 1"'$  (1.549 m), weight 82.1 kg, SpO2 98 %. Body mass index is 34.2 kg/m.  Vitals with BMI 08/13/2020 08/08/2020 07/30/2020  Height '5\' 1"'$  '5\' 1"'$  5' 1.25"  Weight 181 lbs 186 lbs 186 lbs  BMI 34.22 [id number]  Systolic [id number] - [id number]  Diastolic 77 - 86  Pulse 74 - 83    Physical Exam Constitutional:      Appearance: She is obese.  Neck:     Vascular: No carotid bruit or hepatojugular reflux.  Cardiovascular:     Rate and Rhythm: Normal rate and regular rhythm.     Pulses:          Radial pulses are 2+ on the right side and 2+ on the left side.       Femoral pulses are 2+ on the right side and 2+ on the left side.      Popliteal pulses are 2+ on the right side and 2+ on the left side.        Dorsalis pedis pulses are 2+ on the right side and 2+ on the left side.       Posterior tibial pulses are 0 on the right side and 0 on the left side.     Heart sounds: Normal heart sounds. No murmur heard.   No gallop.  Pulmonary:     Effort: Pulmonary effort is normal. No respiratory distress.     Breath sounds: Normal breath sounds. No wheezing, rhonchi or rales.  Abdominal:     General: Bowel sounds are  normal.     Palpations: Abdomen is soft.     Tenderness: There is no abdominal tenderness.  Musculoskeletal:     Right lower leg: Edema present.     Left lower leg: Edema present.  Skin:    Findings: Erythema (to the anterior bilateral lower legs) present.  Neurological:     Mental Status: She is alert.  Psychiatric:        Mood and Affect: Mood is anxious.     Laboratory examination:   Recent Labs    09/05/19 0925 10/02/19 0836 03/28/20 1329 07/30/20 1432  NA 141 139 143 139  K 4.4 4.1 4.0 4.4  CL 103 105 106 102  CO2 '24 28 26 28  '$ GLUCOSE 93 92 97 90  BUN '19 17 18 14  '$ CREATININE 0.68 0.85 0.96 0.74  CALCIUM 9.5 9.7 9.4 9.5  GFRNONAA 91 >60 >60  --   GFRAA 105 >60  --   --    estimated creatinine clearance is 65.3 mL/min (by C-G formula based on SCr of 0.74 mg/dL).  CMP Latest Ref Rng & Units 07/30/2020 03/28/2020 10/02/2019  Glucose 70 - 99 mg/dL 90 97 92  BUN 6 - 23 mg/dL '14 18 17  '$ Creatinine 0.40 - 1.20 mg/dL 0.74 0.96 0.85  Sodium 135 - 145 mEq/L 139 143 139  Potassium 3.5 - 5.1 mEq/L 4.4 4.0 4.1  Chloride 96 - 112 mEq/L 102 106 105  CO2 19 - 32 mEq/L '28 26 28  '$ Calcium 8.4 - 10.5 mg/dL 9.5 9.4 9.7  Total Protein 6.0 - 8.3 g/dL 7.0 7.0 7.3  Total Bilirubin 0.2 - 1.2 mg/dL 0.4 <0.2(L) 0.3  Alkaline Phos 39 - 117 U/L 67 64 67  AST 0 - 37 U/L '22 21 20  '$ ALT 0 - 35 U/L '21 24 22   '$ CBC Latest Ref Rng & Units 07/30/2020 10/02/2019 09/05/2019  WBC 4.0 - 10.5 K/uL 6.4 7.1 6.6  Hemoglobin 12.0 - 15.0 g/dL 12.0 12.2 12.0  Hematocrit 36.0 - 46.0 % 37.0 37.6 36.0   Platelets 150.0 - 400.0 K/uL 258.0 247 239    Lipid Panel Recent Labs    09/05/19 0925  CHOL 188  TRIG 147  LDLCALC 96  HDL 67  CHOLHDL 2.8   Lipid Panel     Component Value Date/Time   CHOL 188 09/05/2019 0925   TRIG 147 09/05/2019 0925   HDL 67 09/05/2019 0925   CHOLHDL 2.8 09/05/2019 0925   CHOLHDL 3 11/19/2008 0833   VLDL 19.4 11/19/2008 0833   LDLCALC 96 09/05/2019 0925   LABVLDL 25 09/05/2019 0925     HEMOGLOBIN A1C Lab Results  Component Value Date   HGBA1C 5.3 09/05/2019   MPG 100 05/03/2013   TSH Recent Labs    09/05/19 0925 07/30/20 1432  TSH 2.540 2.58    External labs:    Medications and allergies   Allergies  Allergen Reactions   Bactrim [Sulfamethoxazole-Trimethoprim] Anaphylaxis, Hives and Other (See Comments)   Phenergan [Promethazine Hcl]     Hives   Sulfa Antibiotics     anaphylaxis   Penicillins     REACTION: As a child.  CEPHALOSPORIN TOLERANT 08/02/2019     Medication prior to this encounter:   Outpatient Medications Prior to Visit  Medication Sig Dispense Refill   aspirin 81 MG chewable tablet Chew 1 tablet (81 mg total) by mouth daily. 90 tablet 1   Cholecalciferol (VITAMIN D3) 2000 units capsule Take 2,000 Units by mouth daily.  clobetasol ointment (TEMOVATE) [id number] % Apply 1 application topically 2 (two) times daily. 45 g 1   indapamide (LOZOL) 2.5 MG tablet Take 1 tablet (2.5 mg total) by mouth daily. 90 tablet 0   Multiple Vitamin (MULTIVITAMIN) tablet Take 1 tablet by mouth daily.     nitroGLYCERIN (NITRODUR - DOSED IN MG/24 HR) 0.2 mg/hr patch Place 1 patch (0.2 mg total) onto the skin daily. 30 patch 3   olmesartan (BENICAR) 20 MG tablet Take 1 tablet (20 mg total) by mouth daily. 90 tablet 0   rosuvastatin (CRESTOR) 5 MG tablet Take 1 tablet (5 mg total) by mouth daily. 90 tablet 1   acetaminophen (TYLENOL) 500 MG tablet Take 1,000 mg by mouth every 8 (eight) hours as needed for moderate pain.     escitalopram  (LEXAPRO) 10 MG tablet take 1/2 tablet by mouth daily for the first week, then increase to 1 tablet daily 30 tablet 2   nystatin cream (MYCOSTATIN)      No facility-administered medications prior to visit.     Medication list after today's encounter   Current Outpatient Medications  Medication Instructions   acetaminophen (TYLENOL) 1,000 mg, Oral, Every 8 hours PRN   aspirin 81 mg, Oral, Daily   clobetasol ointment (TEMOVATE) [id number] % 1 application, Topical, 2 times daily   indapamide (LOZOL) 2.5 mg, Oral, Daily   Multiple Vitamin (MULTIVITAMIN) tablet 1 tablet, Oral, Daily   nitroGLYCERIN (NITRODUR - DOSED IN MG/24 HR) 0.2 mg, Transdermal, Daily   olmesartan (BENICAR) 20 mg, Oral, Daily   rosuvastatin (CRESTOR) 20 mg, Oral, Daily   Vitamin D3 2,000 Units, Oral, Daily    Radiology:   No results found. CT Chest 10/02/2018: 1. Redemonstrated postoperative findings of rectal resection and reanastomosis. No evidence of recurrent mass or abnormal soft tissue in the pelvis. 2. No evidence of metastatic disease in the chest, abdomen, or pelvis 3. There is a new, lobulated soft tissue nodule in the superficial left gluteal soft tissues measuring 3.0 x 2.2 cm (series 2, image 91). This is likely related to injection or trauma, and a very unlikely manifestation of metastatic disease. This can be further evaluated by ultrasound if desired. 4. 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 This can be further interrogated by fluoroscopy if desired. 5. Coronary artery disease. Aortic Atherosclerosis (ICD10-I70.0) and Emphysema (ICD10-J43.9).  CXR 08/02/2019:  Heart size within normal limits. No appreciable airspace consolidation or pulmonary edema. No evidence of pleural effusion or pneumothorax. No acute bony abnormality is identified.  CT scan of the chest and abdomen for colorectal cancer surveillance 10/02/2019: 1.  Redemonstrated postoperative findings of rectal resection and reanastomosis. 2. No evidence of locally recurrent or metastatic disease in the chest, abdomen, or pelvis. 3. 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 4. Nonobstructive right nephrolithiasis. 5. Coronary artery disease.  Aortic Atherosclerosis  Cardiovascular: Aortic atherosclerosis. Normal heart size. three-vessel coronary artery calcifications. No pericardial effusion.   Cardiac Studies:   Echocardiogram 05/07/2013: Left ventricle: The cavity size was normal. Wall thickness  was increased in a pattern of mild LVH. Systolic function  was normal. The estimated ejection fraction was in the range  of 55% to 60%. Wall motion was normal; there were no  regional wall motion abnormalities. Doppler parameters are  consistent with abnormal left ventricular relaxation (grade  1 diastolic dysfunction).    Nuclear Stress Tests 08/08/2020 1. Fixed perfusion defect in  mid to apical anterior/anteroseptal wall and apex with hypokinesis in this region, consistent with prior infarct 2. Fixed inferolateral perfusion defect with normal wall motion, consistent with artifact 3. Mild systolic dysfunction (EF [id number]) 4. Intermediate risk study, suggests LAD territory infarct  EKG:   EKG 08/13/2020: Normal sinus rhythm at rate of 75 bpm, normal axis, T wave abnormality, cannot exclude anterior ischemia.  Probably normal variant.     Assessment     ICD-10-CM   1. Atherosclerosis of native coronary artery of native heart with stable angina pectoris (HCC)  I25.118 EKG 12-Lead    PCV ECHOCARDIOGRAM COMPLETE    rosuvastatin (CRESTOR) 20 MG tablet    2. Coronary artery calcification seen on CAT scan  I25.10     3. Abnormal nuclear stress test  R94.39 PCV ECHOCARDIOGRAM COMPLETE    4. Dyspnea on exertion  R06.00     5. Primary hypertension  I10     6.  Hyperlipidemia LDL goal <70  E78.5 rosuvastatin (CRESTOR) 20 MG tablet       Medications Discontinued During This Encounter  Medication Reason   escitalopram (LEXAPRO) 10 MG tablet Error   nystatin cream (MYCOSTATIN) Error   rosuvastatin (CRESTOR) 5 MG tablet     Meds ordered this encounter  Medications   rosuvastatin (CRESTOR) 20 MG tablet    Sig: Take 1 tablet (20 mg total) by mouth daily.    Dispense:  30 tablet    Refill:  2   Orders Placed This Encounter  Procedures   EKG 12-Lead   PCV ECHOCARDIOGRAM COMPLETE    Standing Status:   Future    Standing Expiration Date:   08/13/2021   Recommendations:   Tonya [name] is a 70 y.o. caucasian female with a past medical history significant for coronary atherosclerosis, hypertension, heart failure, myocardial infarction involving the LAD, hyperlipidemia, obesity, rectal adenocarcinoma, s/p resection with colostomy, and gastric bypass. 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 She presents today by referral from PCP Dr. Scarlette Calico for evaluation of chest pain.   Patient's symptoms are concerning for atypical chest pain. The patient has had a recent nuclear stress test that showed evidence of ischemia as well as a CT on 10/02/2019 that showed coronary plaque and aortic atherosclerosis. Given the patient's symptomatology and work up so far, the patient will be scheduled for a right heart catheterization. Echocardiogram will be completed before the catheterization to assess for any regional wall abnormalities caused by the ischemia seen on stress testing. The patient agrees with this plan. The patient was educated on the benefits and risk of cardiac catheterization and voiced understanding.   The patient was instructed to continue Asprin 81 mg. Given the patient's CAD, the patient's Rosuvastatin was increased to 20 mg daily.   The patient just completed antibiotics ordered by her PCP to treat a possible  venous stasis dermatitis with suspected cellulitis on her bilateral anterior lower legs. The erythema has improved today compared to imaging seen in the PCP's note. The patient also notes improvement.  Recent BNP did not show that the patient was in acute heart failure exacerbation. Will obtain an echocardiogram to monitor the heart's function. The patient's leg swelling is likely related to venous insufficiency. The patient was advised to use compression stockings as soon as she wakes up in the morning and to elevate her legs. Will continue to monitor.   No further labs were ordered at this time since  the patient recently had a lab workup by her PCP. Lipid testing will be warranted in the future.   The patient was encouraged to pursue weight loss through dietary changes.   Follow up in 4 weeks for chest pain, echocardiogram, and cardiac catheterization.    [place], PA-S 08/13/2020, 2:10 PM Office: [phone]

## 2020-08-13 NOTE — Progress Notes (Signed)
Primary Physician/Referring:  [name], MD  Patient ID: Tonya [name], female    DOB: 01/06/1950, 70 y.o.   MRN: [id number]  Chief Complaint  Patient presents with   Congestive Heart Failure   MI   Myocardial Infartion   Coronary Artery Disease   New Patient (Initial Visit)    Referred by Dr. Scarlette Calico   HPI:    Tonya [name]  is a 70 y.o. caucasian female with a past medical history significant for coronary atherosclerosis, hypertension, heart failure, myocardial infarction involving the LAD date unknown, hyperlipidemia, obesity, rectal adenocarcinoma s/p resection, and gastric bypass. 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 She presents today by referral from her PCP Dr. Scarlette Calico for evaluation of chest pain.   The patient describes that "she just doesn't feel right." For the last month, she has been experiencing daily chest discomfort that is described as a "heaviness" on her chest with no radiation. She describes that the episodes last from 10 minutes to 1 hour and occur at random with no specific correlation with rest or activity. The chest discomfort is associated with worsening shortness of breath described as "heavy breathing". She describes increased DOE with climbing the stairs with her laundry at her condominium. She also describes that she experiences shortness of breath in the night that improves when she transitions from the couch to the recliner, suggesting orthopnea/PND. She denies any limitations in her ability to perform her daily household chores. The patient is wearing a nitroglycerin patch but she states that this has not provided relief of any of her symptoms. The patient also describes recent weight gain despite decreased appetite.  She has history of MI listed in her history but she never recalls MI symptoms ever occurring to her. She states that she was told that it likely occurred in her 51's and "bypassed  itself".  Her physical activity includes housework, going to the grocery store, and playing with her dog. Her activity is limited by her lack of anal sphincter tone.   The patient denies any history of smoking or drinking and recent falls.  Past Medical History:  Diagnosis Date   Anemia    few yrs ago   C. difficile diarrhea 03/2018   Cancer ([place])    rectal   CHF (congestive heart failure) ([place])    Coronary artery disease 07/2003   occluded LAD wtih right-to-left collaterals 07/24/03; no current cardiologist   Diabetes mellitus without complication ([place])    Elbow fracture, left    History of chemotherapy 01/2018   radaition last tx jan 2019   Myocardial infarction [facility]) age 34's   Obesity 2003   s/p gastric bypass    Primary hypertension 07/30/2020   Vitamin D deficiency    Wears glasses    Past Surgical History:  Procedure Laterality Date   ANAL RECTAL MANOMETRY N/A 11/01/2018   Procedure: ANO RECTAL MANOMETRY;  Surgeon: Leighton Ruff, MD;  Location: [facility];  Service: Endoscopy;  Laterality: N/A;   CARDIAC CATHETERIZATION     prior to [place]   GASTRIC BYPASS  2003   IR FLUORO GUIDE PORT INSERTION RIGHT  01/25/2017   IR REMOVAL TUN ACCESS W/ PORT W/O FL MOD SED  10/17/2018   IR US GUIDE VASC ACCESS RIGHT  01/25/2017   ORIF HUMERUS FRACTURE Left 08/02/2019   Procedure: OPEN REDUCTION INTERNAL FIXATION (ORIF) LEFT ELBOW FRACTURE.;  Surgeon:  Altamese Summerhill, MD;  Location: [facility];  Service: Orthopedics;  Laterality: Left;   ostomy placed and removed  12/2017   SACRAL NERVE STIMULATOR PLACEMENT  01-12-2019   DR Marcello Moores '@[email or web address]'$    TEST PHASE   Family History  Problem Relation Age of Onset   Heart attack Mother        d.93   Throat cancer Mother 57   Clotting disorder Mother    Liver disease Mother    Kidney disease Mother    Heart attack Father        d.92   Prostate cancer Father 74   Uterine cancer Sister 68       treated with total hysterectomy  and radiation   Thyroid cancer Sister 38       treated with thyroidectomy   Uterine cancer Sister 38   Cancer Maternal Uncle        d.80s unspecified type of cancer. History of smoking.   Colon cancer Cousin 27       paternal first-cousin    Social History   Tobacco Use   Smoking status: Never   Smokeless tobacco: Never  Substance Use Topics   Alcohol use: No   Marital Status: Single  ROS  Review of Systems  Constitutional: Positive for decreased appetite and weight gain.  Cardiovascular:  Positive for chest pain (heaviness), claudication (with going up stairs), dyspnea on exertion, leg swelling and orthopnea. Negative for near-syncope, palpitations and syncope.  Respiratory:  Positive for cough (feels like something is in her throat), shortness of breath and sleep disturbances due to breathing.   Skin:  Positive for color change (in anterior lower legs).  Gastrointestinal:  Positive for diarrhea (hx of rectal cancer).  Neurological:  Positive for difficulty with concentration, dizziness and light-headedness.  Psychiatric/Behavioral:  The patient is nervous/anxious.   Objective  Blood pressure 124/77, pulse 74, temperature 98.6 F (37 C), temperature source Temporal, resp. rate 17, height '5\' 1"'$  (1.549 m), weight 82.1 kg, SpO2 98 %. Body mass index is 34.2 kg/m.  Vitals with BMI 08/13/2020 08/08/2020 07/30/2020  Height '5\' 1"'$  '5\' 1"'$  5' 1.25"  Weight 181 lbs 186 lbs 186 lbs  BMI 34.22 [id number]  Systolic [id number] - [id number]  Diastolic 77 - 86  Pulse 74 - 83    Physical Exam Constitutional:      Appearance: She is obese.  Neck:     Vascular: No carotid bruit or hepatojugular reflux.  Cardiovascular:     Rate and Rhythm: Normal rate and regular rhythm.     Pulses:          Radial pulses are 2+ on the right side and 2+ on the left side.       Femoral pulses are 2+ on the right side and 2+ on the left side.      Popliteal pulses are 2+ on the right side and 2+ on the left side.        Dorsalis pedis pulses are 2+ on the right side and 2+ on the left side.       Posterior tibial pulses are 0 on the right side and 0 on the left side.     Heart sounds: Normal heart sounds. No murmur heard.   No gallop.  Pulmonary:     Effort: Pulmonary effort is normal. No respiratory distress.     Breath sounds: Normal breath sounds. No wheezing, rhonchi or rales.  Abdominal:     General: Bowel sounds are  normal.     Palpations: Abdomen is soft.     Tenderness: There is no abdominal tenderness.  Musculoskeletal:     Right lower leg: Edema present.     Left lower leg: Edema present.  Skin:    Findings: Erythema (to the anterior bilateral lower legs) present.  Neurological:     Mental Status: She is alert.  Psychiatric:        Mood and Affect: Mood is anxious.     Laboratory examination:   Recent Labs    09/05/19 0925 10/02/19 0836 03/28/20 1329 07/30/20 1432  NA 141 139 143 139  K 4.4 4.1 4.0 4.4  CL 103 105 106 102  CO2 '24 28 26 28  '$ GLUCOSE 93 92 97 90  BUN '19 17 18 14  '$ CREATININE 0.68 0.85 0.96 0.74  CALCIUM 9.5 9.7 9.4 9.5  GFRNONAA 91 >60 >60  --   GFRAA 105 >60  --   --    estimated creatinine clearance is 65.3 mL/min (by C-G formula based on SCr of 0.74 mg/dL).  CMP Latest Ref Rng & Units 07/30/2020 03/28/2020 10/02/2019  Glucose 70 - 99 mg/dL 90 97 92  BUN 6 - 23 mg/dL '14 18 17  '$ Creatinine 0.40 - 1.20 mg/dL 0.74 0.96 0.85  Sodium 135 - 145 mEq/L 139 143 139  Potassium 3.5 - 5.1 mEq/L 4.4 4.0 4.1  Chloride 96 - 112 mEq/L 102 106 105  CO2 19 - 32 mEq/L '28 26 28  '$ Calcium 8.4 - 10.5 mg/dL 9.5 9.4 9.7  Total Protein 6.0 - 8.3 g/dL 7.0 7.0 7.3  Total Bilirubin 0.2 - 1.2 mg/dL 0.4 <0.2(L) 0.3  Alkaline Phos 39 - 117 U/L 67 64 67  AST 0 - 37 U/L '22 21 20  '$ ALT 0 - 35 U/L '21 24 22   '$ CBC Latest Ref Rng & Units 07/30/2020 10/02/2019 09/05/2019  WBC 4.0 - 10.5 K/uL 6.4 7.1 6.6  Hemoglobin 12.0 - 15.0 g/dL 12.0 12.2 12.0  Hematocrit 36.0 - 46.0 % 37.0 37.6 36.0   Platelets 150.0 - 400.0 K/uL 258.0 247 239    Lipid Panel Recent Labs    09/05/19 0925  CHOL 188  TRIG 147  LDLCALC 96  HDL 67  CHOLHDL 2.8   Lipid Panel     Component Value Date/Time   CHOL 188 09/05/2019 0925   TRIG 147 09/05/2019 0925   HDL 67 09/05/2019 0925   CHOLHDL 2.8 09/05/2019 0925   CHOLHDL 3 11/19/2008 0833   VLDL 19.4 11/19/2008 0833   LDLCALC 96 09/05/2019 0925   LABVLDL 25 09/05/2019 0925     HEMOGLOBIN A1C Lab Results  Component Value Date   HGBA1C 5.3 09/05/2019   MPG 100 05/03/2013   TSH Recent Labs    09/05/19 0925 07/30/20 1432  TSH 2.540 2.58    External labs:    Medications and allergies   Allergies  Allergen Reactions   Bactrim [Sulfamethoxazole-Trimethoprim] Anaphylaxis, Hives and Other (See Comments)   Phenergan [Promethazine Hcl]     Hives   Sulfa Antibiotics     anaphylaxis   Penicillins     REACTION: As a child.  CEPHALOSPORIN TOLERANT 08/02/2019     Medication prior to this encounter:   Outpatient Medications Prior to Visit  Medication Sig Dispense Refill   aspirin 81 MG chewable tablet Chew 1 tablet (81 mg total) by mouth daily. 90 tablet 1   Cholecalciferol (VITAMIN D3) 2000 units capsule Take 2,000 Units by mouth daily.  clobetasol ointment (TEMOVATE) [id number] % Apply 1 application topically 2 (two) times daily. 45 g 1   indapamide (LOZOL) 2.5 MG tablet Take 1 tablet (2.5 mg total) by mouth daily. 90 tablet 0   Multiple Vitamin (MULTIVITAMIN) tablet Take 1 tablet by mouth daily.     nitroGLYCERIN (NITRODUR - DOSED IN MG/24 HR) 0.2 mg/hr patch Place 1 patch (0.2 mg total) onto the skin daily. 30 patch 3   olmesartan (BENICAR) 20 MG tablet Take 1 tablet (20 mg total) by mouth daily. 90 tablet 0   rosuvastatin (CRESTOR) 5 MG tablet Take 1 tablet (5 mg total) by mouth daily. 90 tablet 1   acetaminophen (TYLENOL) 500 MG tablet Take 1,000 mg by mouth every 8 (eight) hours as needed for moderate pain.     escitalopram  (LEXAPRO) 10 MG tablet take 1/2 tablet by mouth daily for the first week, then increase to 1 tablet daily 30 tablet 2   nystatin cream (MYCOSTATIN)      No facility-administered medications prior to visit.     Medication list after today's encounter   Current Outpatient Medications  Medication Instructions   acetaminophen (TYLENOL) 1,000 mg, Oral, Every 8 hours PRN   aspirin 81 mg, Oral, Daily   clobetasol ointment (TEMOVATE) [id number] % 1 application, Topical, 2 times daily   indapamide (LOZOL) 2.5 mg, Oral, Daily   Multiple Vitamin (MULTIVITAMIN) tablet 1 tablet, Oral, Daily   nitroGLYCERIN (NITRODUR - DOSED IN MG/24 HR) 0.2 mg, Transdermal, Daily   olmesartan (BENICAR) 20 mg, Oral, Daily   rosuvastatin (CRESTOR) 20 mg, Oral, Daily   Vitamin D3 2,000 Units, Oral, Daily    Radiology:   No results found. CT Chest 10/02/2018: 1. Redemonstrated postoperative findings of rectal resection and reanastomosis. No evidence of recurrent mass or abnormal soft tissue in the pelvis. 2. No evidence of metastatic disease in the chest, abdomen, or pelvis 3. There is a new, lobulated soft tissue nodule in the superficial left gluteal soft tissues measuring 3.0 x 2.2 cm (series 2, image 91). This is likely related to injection or trauma, and a very unlikely manifestation of metastatic disease. This can be further evaluated by ultrasound if desired. 4. 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 This can be further interrogated by fluoroscopy if desired. 5. Coronary artery disease. Aortic Atherosclerosis (ICD10-I70.0) and Emphysema (ICD10-J43.9).  CXR 08/02/2019:  Heart size within normal limits. No appreciable airspace consolidation or pulmonary edema. No evidence of pleural effusion or pneumothorax. No acute bony abnormality is identified.  CT scan of the chest and abdomen for colorectal cancer surveillance 10/02/2019: 1.  Redemonstrated postoperative findings of rectal resection and reanastomosis. 2. No evidence of locally recurrent or metastatic disease in the chest, abdomen, or pelvis. 3. 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 4. Nonobstructive right nephrolithiasis. 5. Coronary artery disease.  Aortic Atherosclerosis  Cardiovascular: Aortic atherosclerosis. Normal heart size. three-vessel coronary artery calcifications. No pericardial effusion.   Cardiac Studies:   Echocardiogram 05/07/2013: Left ventricle: The cavity size was normal. Wall thickness  was increased in a pattern of mild LVH. Systolic function  was normal. The estimated ejection fraction was in the range  of 55% to 60%. Wall motion was normal; there were no  regional wall motion abnormalities. Doppler parameters are  consistent with abnormal left ventricular relaxation (grade  1 diastolic dysfunction).    Nuclear Stress Tests 08/08/2020 1. Fixed perfusion defect in  mid to apical anterior/anteroseptal wall and apex with hypokinesis in this region, consistent with prior infarct 2. Fixed inferolateral perfusion defect with normal wall motion, consistent with artifact 3. Mild systolic dysfunction (EF [id number]) 4. Intermediate risk study, suggests LAD territory infarct  EKG:   EKG 08/13/2020: Normal sinus rhythm at rate of 75 bpm, normal axis, T wave abnormality, cannot exclude anterior ischemia.  Probably normal variant.     Assessment     ICD-10-CM   1. Atherosclerosis of native coronary artery of native heart with stable angina pectoris (HCC)  I25.118 EKG 12-Lead    PCV ECHOCARDIOGRAM COMPLETE    rosuvastatin (CRESTOR) 20 MG tablet    2. Coronary artery calcification seen on CAT scan  I25.10     3. Abnormal nuclear stress test  R94.39 PCV ECHOCARDIOGRAM COMPLETE    4. Dyspnea on exertion  R06.00     5. Primary hypertension  I10     6.  Hyperlipidemia LDL goal <70  E78.5 rosuvastatin (CRESTOR) 20 MG tablet       Medications Discontinued During This Encounter  Medication Reason   escitalopram (LEXAPRO) 10 MG tablet Error   nystatin cream (MYCOSTATIN) Error   rosuvastatin (CRESTOR) 5 MG tablet     Meds ordered this encounter  Medications   rosuvastatin (CRESTOR) 20 MG tablet    Sig: Take 1 tablet (20 mg total) by mouth daily.    Dispense:  30 tablet    Refill:  2   Orders Placed This Encounter  Procedures   EKG 12-Lead   PCV ECHOCARDIOGRAM COMPLETE    Standing Status:   Future    Standing Expiration Date:   08/13/2021   Recommendations:   Tonya [name] is a 70 y.o. caucasian female with a past medical history significant for coronary atherosclerosis, hypertension, heart failure, myocardial infarction involving the LAD, hyperlipidemia, obesity, rectal adenocarcinoma, s/p resection with colostomy, and gastric bypass. 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 She presents today by referral from PCP Dr. Scarlette Calico for evaluation of chest pain.   Patient's symptoms are concerning for atypical chest pain. The patient has had a recent nuclear stress test that showed evidence of ischemia as well as a CT on 10/02/2019 that showed coronary plaque and aortic atherosclerosis. Given the patient's symptomatology and work up so far, the patient will be scheduled for a right heart catheterization. Echocardiogram will be completed before the catheterization to assess for any regional wall abnormalities caused by the ischemia seen on stress testing. The patient agrees with this plan. The patient was educated on the benefits and risk of cardiac catheterization and voiced understanding.   The patient was instructed to continue Asprin 81 mg. Given the patient's CAD, the patient's Rosuvastatin was increased to 20 mg daily.   The patient just completed antibiotics ordered by her PCP to treat a possible  venous stasis dermatitis with suspected cellulitis on her bilateral anterior lower legs. The erythema has improved today compared to imaging seen in the PCP's note. The patient also notes improvement.  Recent BNP did not show that the patient was in acute heart failure exacerbation. Will obtain an echocardiogram to monitor the heart's function. The patient's leg swelling is likely related to venous insufficiency. The patient was advised to use compression stockings as soon as she wakes up in the morning and to elevate her legs. Will continue to monitor.   No further labs were ordered at this time since  the patient recently had a lab workup by her PCP. Lipid testing will be warranted in the future.   The patient was encouraged to pursue weight loss through dietary changes.   Follow up in 4 weeks for chest pain, echocardiogram, and cardiac catheterization.    [place], PA-S 08/13/2020, 2:10 PM Office: [phone]

## 2020-08-19 ENCOUNTER — Ambulatory Visit: Payer: Medicare HMO

## 2020-08-19 ENCOUNTER — Other Ambulatory Visit: Payer: Self-pay

## 2020-08-19 DIAGNOSIS — I25118 Atherosclerotic heart disease of native coronary artery with other forms of angina pectoris: Secondary | ICD-10-CM

## 2020-08-19 DIAGNOSIS — R9439 Abnormal result of other cardiovascular function study: Secondary | ICD-10-CM

## 2020-08-21 ENCOUNTER — Telehealth: Payer: Self-pay | Admitting: Cardiology

## 2020-08-21 NOTE — Telephone Encounter (Signed)
Pt wants to discuss details of her cardiac catheterization appt w/someone.

## 2020-08-25 ENCOUNTER — Ambulatory Visit: Payer: Medicare HMO

## 2020-08-25 ENCOUNTER — Telehealth: Payer: Self-pay

## 2020-08-25 NOTE — Addendum Note (Signed)
Encounter addended by: Ines Bloomer on: 08/25/2020 2:27 PM  Actions taken: Imaging Exam ended, Vitals modified

## 2020-08-26 ENCOUNTER — Other Ambulatory Visit (HOSPITAL_COMMUNITY): Payer: Self-pay

## 2020-08-26 ENCOUNTER — Encounter: Payer: Self-pay | Admitting: Hematology

## 2020-08-26 ENCOUNTER — Encounter (HOSPITAL_COMMUNITY)
Admission: RE | Disposition: A | Discharge status: Home / Self Care | Payer: Self-pay | Source: Home / Self Care | Attending: Cardiology

## 2020-08-26 ENCOUNTER — Other Ambulatory Visit: Payer: Self-pay

## 2020-08-26 ENCOUNTER — Ambulatory Visit (HOSPITAL_COMMUNITY)
Admission: RE | Admit: 2020-08-26 | Discharge: 2020-08-26 | Disposition: A | Discharge status: Home / Self Care | Payer: Medicare HMO | Attending: Cardiology | Admitting: Cardiology

## 2020-08-26 DIAGNOSIS — Z7982 Long term (current) use of aspirin: Secondary | ICD-10-CM | POA: Diagnosis not present

## 2020-08-26 DIAGNOSIS — Z95820 Peripheral vascular angioplasty status with implants and grafts: Secondary | ICD-10-CM

## 2020-08-26 DIAGNOSIS — I110 Hypertensive heart disease with heart failure: Secondary | ICD-10-CM | POA: Diagnosis not present

## 2020-08-26 DIAGNOSIS — Z882 Allergy status to sulfonamides status: Secondary | ICD-10-CM | POA: Diagnosis not present

## 2020-08-26 DIAGNOSIS — R079 Chest pain, unspecified: Secondary | ICD-10-CM | POA: Diagnosis present

## 2020-08-26 DIAGNOSIS — Z955 Presence of coronary angioplasty implant and graft: Secondary | ICD-10-CM | POA: Diagnosis not present

## 2020-08-26 DIAGNOSIS — E785 Hyperlipidemia, unspecified: Secondary | ICD-10-CM | POA: Insufficient documentation

## 2020-08-26 DIAGNOSIS — Z8249 Family history of ischemic heart disease and other diseases of the circulatory system: Secondary | ICD-10-CM | POA: Diagnosis not present

## 2020-08-26 DIAGNOSIS — I509 Heart failure, unspecified: Secondary | ICD-10-CM | POA: Insufficient documentation

## 2020-08-26 DIAGNOSIS — Z881 Allergy status to other antibiotic agents status: Secondary | ICD-10-CM | POA: Insufficient documentation

## 2020-08-26 DIAGNOSIS — Z9884 Bariatric surgery status: Secondary | ICD-10-CM | POA: Insufficient documentation

## 2020-08-26 DIAGNOSIS — I252 Old myocardial infarction: Secondary | ICD-10-CM | POA: Diagnosis not present

## 2020-08-26 DIAGNOSIS — I25118 Atherosclerotic heart disease of native coronary artery with other forms of angina pectoris: Secondary | ICD-10-CM | POA: Diagnosis present

## 2020-08-26 DIAGNOSIS — I2582 Chronic total occlusion of coronary artery: Secondary | ICD-10-CM | POA: Diagnosis not present

## 2020-08-26 DIAGNOSIS — Z888 Allergy status to other drugs, medicaments and biological substances status: Secondary | ICD-10-CM | POA: Insufficient documentation

## 2020-08-26 DIAGNOSIS — Z880 Allergy status to penicillin: Secondary | ICD-10-CM | POA: Diagnosis not present

## 2020-08-26 DIAGNOSIS — Z79899 Other long term (current) drug therapy: Secondary | ICD-10-CM | POA: Diagnosis not present

## 2020-08-26 DIAGNOSIS — Z85048 Personal history of other malignant neoplasm of rectum, rectosigmoid junction, and anus: Secondary | ICD-10-CM | POA: Diagnosis not present

## 2020-08-26 DIAGNOSIS — I2510 Atherosclerotic heart disease of native coronary artery without angina pectoris: Secondary | ICD-10-CM | POA: Diagnosis present

## 2020-08-26 HISTORY — PX: CORONARY CTO INTERVENTION: CATH118236

## 2020-08-26 HISTORY — PX: CORONARY STENT INTERVENTION: CATH118234

## 2020-08-26 HISTORY — PX: LEFT HEART CATH AND CORONARY ANGIOGRAPHY: CATH118249

## 2020-08-26 LAB — POCT ACTIVATED CLOTTING TIME
Activated Clotting Time: 288 seconds
Activated Clotting Time: 323 seconds
Activated Clotting Time: 254 seconds

## 2020-08-26 SURGERY — LEFT HEART CATH AND CORONARY ANGIOGRAPHY
Anesthesia: LOCAL

## 2020-08-26 MED ORDER — HEPARIN SODIUM (PORCINE) 1000 UNIT/ML IJ SOLN
INTRAMUSCULAR | Status: DC | PRN
  Administered 2020-08-26: 3000 [IU] via INTRAVENOUS
  Administered 2020-08-26: 4000 [IU] via INTRAVENOUS
  Administered 2020-08-26: 5000 [IU] via INTRAVENOUS

## 2020-08-26 MED ORDER — SODIUM CHLORIDE 0.9 % WEIGHT BASED INFUSION
3.0000 mL/kg/h | INTRAVENOUS | Status: DC
  Administered 2020-08-26: 3 mL/kg/h via INTRAVENOUS

## 2020-08-26 MED ORDER — HEPARIN (PORCINE) IN NACL 1000-0.9 UT/500ML-% IV SOLN
INTRAVENOUS | Status: AC
  Filled 2020-08-26: qty 1000

## 2020-08-26 MED ORDER — ASPIRIN 81 MG PO CHEW: 81.0000 mg | CHEWABLE_TABLET | ORAL | Status: DC

## 2020-08-26 MED ORDER — IOHEXOL 350 MG/ML SOLN
INTRAVENOUS | Status: DC | PRN
  Administered 2020-08-26: 220 mL

## 2020-08-26 MED ORDER — FENTANYL CITRATE (PF) 100 MCG/2ML IJ SOLN
INTRAMUSCULAR | Status: DC | PRN
  Administered 2020-08-26 (×2): 25 ug via INTRAVENOUS

## 2020-08-26 MED ORDER — SODIUM CHLORIDE 0.9% FLUSH: 3.0000 mL | Freq: Two times a day (BID) | INTRAVENOUS | Status: DC

## 2020-08-26 MED ORDER — ONDANSETRON HCL 4 MG/2ML IJ SOLN: 4.0000 mg | Freq: Four times a day (QID) | INTRAMUSCULAR | Status: DC | PRN

## 2020-08-26 MED ORDER — MIDAZOLAM HCL 2 MG/2ML IJ SOLN
INTRAMUSCULAR | Status: AC
  Filled 2020-08-26: qty 2

## 2020-08-26 MED ORDER — NITROGLYCERIN 1 MG/10 ML FOR IR/CATH LAB
INTRA_ARTERIAL | Status: DC | PRN
  Administered 2020-08-26: 200 ug via INTRACORONARY
  Administered 2020-08-26: 200 ug via INTRA_ARTERIAL

## 2020-08-26 MED ORDER — CLOPIDOGREL BISULFATE 75 MG PO TABS: 75.0000 mg | ORAL_TABLET | Freq: Every day | ORAL | 1 refills | Status: DC

## 2020-08-26 MED ORDER — HEPARIN SODIUM (PORCINE) 1000 UNIT/ML IJ SOLN
INTRAMUSCULAR | Status: AC
  Filled 2020-08-26: qty 1

## 2020-08-26 MED ORDER — SODIUM CHLORIDE 0.9 % WEIGHT BASED INFUSION: 1.0000 mL/kg/h | INTRAVENOUS | Status: DC

## 2020-08-26 MED ORDER — SODIUM CHLORIDE 0.9 % IV SOLN: 250.0000 mL | INTRAVENOUS | Status: DC | PRN

## 2020-08-26 MED ORDER — LIDOCAINE HCL (PF) 1 % IJ SOLN
INTRAMUSCULAR | Status: AC
  Filled 2020-08-26: qty 30

## 2020-08-26 MED ORDER — MIDAZOLAM HCL 2 MG/2ML IJ SOLN
INTRAMUSCULAR | Status: DC | PRN
  Administered 2020-08-26 (×2): 1 mg via INTRAVENOUS
  Administered 2020-08-26: 2 mg via INTRAVENOUS

## 2020-08-26 MED ORDER — VERAPAMIL HCL 2.5 MG/ML IV SOLN
INTRAVENOUS | Status: DC | PRN
  Administered 2020-08-26: 10 mL via INTRA_ARTERIAL

## 2020-08-26 MED ORDER — CLOPIDOGREL BISULFATE 300 MG PO TABS
ORAL_TABLET | ORAL | Status: AC
  Filled 2020-08-26: qty 2

## 2020-08-26 MED ORDER — LIDOCAINE HCL (PF) 1 % IJ SOLN
INTRAMUSCULAR | Status: DC | PRN
  Administered 2020-08-26: 2 mL

## 2020-08-26 MED ORDER — ACETAMINOPHEN 325 MG PO TABS: 650.0000 mg | ORAL_TABLET | ORAL | Status: DC | PRN

## 2020-08-26 MED ORDER — VERAPAMIL HCL 2.5 MG/ML IV SOLN
INTRAVENOUS | Status: AC
  Filled 2020-08-26: qty 2

## 2020-08-26 MED ORDER — FENTANYL CITRATE (PF) 100 MCG/2ML IJ SOLN
INTRAMUSCULAR | Status: AC
  Filled 2020-08-26: qty 2

## 2020-08-26 MED ORDER — CLOPIDOGREL BISULFATE 300 MG PO TABS
ORAL_TABLET | ORAL | Status: DC | PRN
  Administered 2020-08-26: 600 mg via ORAL

## 2020-08-26 MED ORDER — METOPROLOL TARTRATE 25 MG PO TABS
25.0000 mg | ORAL_TABLET | Freq: Two times a day (BID) | ORAL | 2 refills | Status: DC
  Filled 2020-08-26: qty 60, 30d supply, fill #0

## 2020-08-26 MED ORDER — HEPARIN (PORCINE) IN NACL 1000-0.9 UT/500ML-% IV SOLN
INTRAVENOUS | Status: DC | PRN
  Administered 2020-08-26: 500 mL

## 2020-08-26 MED ORDER — SODIUM CHLORIDE 0.9% FLUSH: 3.0000 mL | INTRAVENOUS | Status: DC | PRN

## 2020-08-26 MED ORDER — NITROGLYCERIN 1 MG/10 ML FOR IR/CATH LAB
INTRA_ARTERIAL | Status: AC
  Filled 2020-08-26: qty 10

## 2020-08-26 MED ORDER — CLOPIDOGREL BISULFATE 75 MG PO TABS
75.0000 mg | ORAL_TABLET | Freq: Every day | ORAL | 0 refills | Status: DC
  Filled 2020-08-26: qty 30, 30d supply, fill #0

## 2020-08-26 MED ORDER — HEPARIN (PORCINE) IN NACL 1000-0.9 UT/500ML-% IV SOLN
INTRAVENOUS | Status: DC | PRN
Start: 2020-08-26 — End: 2020-08-26
  Administered 2020-08-26: 500 mL

## 2020-08-26 SURGICAL SUPPLY — 22 items
BALLN SAPPHIRE 1.5X12 (BALLOONS) ×2
BALLN SAPPHIRE 2.0X12 (BALLOONS) ×2
BALLN SCOREFLEX 3.0X15 (BALLOONS) ×2
BALLOON SAPPHIRE 1.5X12 (BALLOONS) IMPLANT
BALLOON SAPPHIRE 2.0X12 (BALLOONS) IMPLANT
BALLOON SCOREFLEX 3.0X15 (BALLOONS) IMPLANT
CATH OPTITORQUE TIG 4.0 5F (CATHETERS) ×1 IMPLANT
CATH VISTA GUIDE 6FR XBLAD3.5 (CATHETERS) ×1 IMPLANT
DEVICE RAD COMP TR BAND LRG (VASCULAR PRODUCTS) ×1 IMPLANT
GLIDESHEATH SLEND A-KIT 6F 22G (SHEATH) ×2 IMPLANT
GUIDEWIRE INQWIRE 1.5J.035X260 (WIRE) ×1 IMPLANT
INQWIRE 1.5J .035X260CM (WIRE) ×2
KIT ENCORE 26 ADVANTAGE (KITS) ×1 IMPLANT
KIT HEART LEFT (KITS) ×2 IMPLANT
PACK CARDIAC CATHETERIZATION (CUSTOM PROCEDURE TRAY) ×2 IMPLANT
STENT ONYX FRONTIER 2.0X12 (Permanent Stent) ×1 IMPLANT
STENT ONYX FRONTIER 2.5X30 (Permanent Stent) ×1 IMPLANT
STENT ONYX FRONTIER 3.0X18 (Permanent Stent) ×1 IMPLANT
TRANSDUCER W/STOPCOCK (MISCELLANEOUS) ×2 IMPLANT
TUBING CIL FLEX 10 FLL-RA (TUBING) ×2 IMPLANT
WIRE COUGAR XT STRL 190CM (WIRE) ×1 IMPLANT
WIRE HI TORQ WHISPER MS 190CM (WIRE) ×1 IMPLANT

## 2020-08-26 NOTE — Telephone Encounter (Signed)
Pt had called yesterday afternoon in regards to some question about her procedure. Asiana had emailed her the instructions. Not sure why this sent.

## 2020-08-26 NOTE — Progress Notes (Signed)
1125-1202 Education completed with pt who voiced understanding. Stressed importance of plavix with stent. Reviewed NTG use, walking for ex, heart healthy food choices,and CRP 2. Referred to [place]. Graylon Good RN BSN 08/26/2020 12:03 PM

## 2020-08-26 NOTE — Discharge Instructions (Signed)
Information about your medication: Plavix (anti-platelet agent)  Generic Name (Brand): clopidogrel (Plavix), once daily medication  PURPOSE: You are taking this medication along with aspirin to lower your chance of having a heart attack, stroke, or blood clots in your heart stent. These can be fatal. Plavix and aspirin help prevent platelets from sticking together and forming a clot that can block an artery or your stent.   Common SIDE EFFECTS you may experience include: bruising or bleeding more easily, shortness of breath  Do not stop taking PLAVIX without talking to the doctor who prescribes it for you. People who are treated with a stent and stop taking Plavix too soon, have a higher risk of getting a blood clot in the stent, having a heart attack, or dying. If you stop Plavix because of bleeding, or for other reasons, your risk of a heart attack or stroke may increase.   Tell all of your doctors and dentists that you are taking Plavix. They should talk to the doctor who prescribed Plavix for you before you have any surgery or invasive procedure.   Contact your health care provider if you experience: severe or uncontrollable bleeding, pink/red/brown urine, vomiting blood or vomit that looks like "coffee grounds", red or black stools (looks like tar), coughing up blood or blood clots ----------------------------------------------------------------------------------------------------------------------  

## 2020-08-26 NOTE — Interval H&P Note (Signed)
History and Physical Interval Note:  08/26/2020 7:53 AM  Tonya [name]  has presented today for surgery, with the diagnosis of chest pain.  The various methods of treatment have been discussed with the patient and family. After consideration of risks, benefits and other options for treatment, the patient has consented to  Procedure(s): LEFT HEART CATH AND CORONARY ANGIOGRAPHY (N/A) and possible PCI as a surgical intervention.  The patient's history has been reviewed, patient examined, no change in status, stable for surgery.  I have reviewed the patient's chart and labs.  Questions were answered to the patient's satisfaction.   Cath Lab Visit (complete for each Cath Lab visit)  Clinical Evaluation Leading to the Procedure:   ACS: No.  Non-ACS:    Anginal Classification: CCS III  Anti-ischemic medical therapy: Minimal Therapy (1 class of medications)  Non-Invasive Test Results: Intermediate-risk stress test findings: cardiac mortality 1-3%/year  Prior CABG: No previous CABG    Adrian Prows

## 2020-08-27 ENCOUNTER — Encounter (HOSPITAL_COMMUNITY): Payer: Self-pay | Admitting: Cardiology

## 2020-08-29 ENCOUNTER — Telehealth (HOSPITAL_COMMUNITY): Payer: Self-pay

## 2020-08-29 NOTE — Telephone Encounter (Signed)
Called patient to see if she is interested in the Cardiac Rehab Program. Patient expressed interest. Explained scheduling process and went over insurance, patient verbalized understanding. Will contact patient for scheduling once f/u has been completed. 

## 2020-08-29 NOTE — Telephone Encounter (Signed)
Pt insurance is active and benefits verified through [facility] Co-pay $45, DED 0/0 met, out of pocket $4,500/$370 met, co-insurance 0%. no pre-authorization required. Passport, 08/29/2020_0 :48am, REF# [id number]  Will contact patient to see if she is interested in the Cardiac Rehab Program. If interested, patient will need to complete follow up appt. Once completed, patient will be contacted for scheduling upon review by the RN Navigator.

## 2020-08-29 NOTE — Telephone Encounter (Signed)
Referral recv'd and verified for MD signature. Follow up appointment is on 09/16/2020. Insurance benefits and eligibility TBD.

## 2020-09-03 ENCOUNTER — Telehealth (HOSPITAL_COMMUNITY): Payer: Self-pay | Admitting: Pharmacist

## 2020-09-03 ENCOUNTER — Other Ambulatory Visit (HOSPITAL_COMMUNITY): Payer: Self-pay

## 2020-09-03 NOTE — Telephone Encounter (Signed)
Pharmacy Transitions of Care Follow-up Telephone Call  Date of discharge: 08/26/20  Discharge Diagnosis: CAD w/stent  How have you been since you were released from the hospital?  Pt doing well with no complaints  Medication changes made at discharge:  - START:  Clopidogrel Metoprolol tartrate  - STOPPED: n/a  - CHANGED: n/a  Medication changes verified by the patient?  Yes    Medication Accessibility:  Home Pharmacy: [facility]   Was the patient provided with refills on discharged medications? Only on metoprolol   Have all prescriptions been transferred from [facility] to home pharmacy?  Yes, pt has new rx of clopidogrel at [facility] and I  have transferred metoprolol refills.   Is the patient able to afford medications? [employer]  Notable copays: both generic ($3) Eligible patient assistance: n/a    Medication Review:  CLOPIDOGREL (PLAVIX) Clopidogrel 75 mg once daily.  - Educated patient on expected duration of therapy of ASA with clopidogrel, usually at least 1 year.  Advised patient that aspirin may be continued indefinitely.  - Reviewed potential DDIs with patient  - Advised patient of medications to avoid (NSAIDs, ASA)  - Educated that Tylenol (acetaminophen) will be the preferred analgesic to prevent risk of bleeding  - Emphasized importance of monitoring for signs and symptoms of bleeding (abnormal bruising, prolonged bleeding, nose bleeds, bleeding from gums, discolored urine, black tarry stools)  - Advised patient to alert all providers of anticoagulation therapy prior to starting a new medication or having a procedure    Follow-up Appointments:  PCP Hospital f/u appt confirmed?  Non scheduled  Specialist Hospital f/u appt confirmed? Yes Scheduled to see cardiology, Dr. Einar Gip on 9/13 @ 11:00.   If their condition worsens, is the pt aware to call PCP or go to the Emergency Dept.? yes  Final Patient Assessment:  Pt was not familiar with the  names of her new medications but we have reviewed both and she demonstrates understanding. Pt expressed appreciation for the call.

## 2020-09-16 ENCOUNTER — Other Ambulatory Visit: Payer: Self-pay

## 2020-09-16 ENCOUNTER — Encounter: Payer: Self-pay | Admitting: Cardiology

## 2020-09-16 ENCOUNTER — Ambulatory Visit: Payer: Medicare HMO | Admitting: Cardiology

## 2020-09-16 VITALS — BP 112/60 | HR 70 | Ht 61.0 in | Wt 184.0 lb

## 2020-09-16 DIAGNOSIS — Z95820 Peripheral vascular angioplasty status with implants and grafts: Secondary | ICD-10-CM | POA: Diagnosis not present

## 2020-09-16 DIAGNOSIS — R0600 Dyspnea, unspecified: Secondary | ICD-10-CM

## 2020-09-16 DIAGNOSIS — I25118 Atherosclerotic heart disease of native coronary artery with other forms of angina pectoris: Secondary | ICD-10-CM | POA: Diagnosis not present

## 2020-09-16 DIAGNOSIS — R0609 Other forms of dyspnea: Secondary | ICD-10-CM

## 2020-09-16 DIAGNOSIS — I10 Essential (primary) hypertension: Secondary | ICD-10-CM

## 2020-09-16 MED ORDER — CLOPIDOGREL BISULFATE 75 MG PO TABS: 75.0000 mg | ORAL_TABLET | Freq: Every day | ORAL | 2 refills | Status: DC

## 2020-09-16 NOTE — Progress Notes (Signed)
Primary Physician/Referring:  [name], MD  Patient ID: Tonya [name], female    DOB: Feb 22, 1950, 70 y.o.   MRN: [id number]  Chief Complaint  Patient presents with   Coronary Artery Disease   Follow-up   Hospitalization Follow-up   HPI:    Tonya [name]  is a 70 y.o. caucasian female with a past medical history significant for coronary atherosclerosis, hypertension, heart failure, myocardial infarction involving the LAD date unknown, hyperlipidemia, obesity, rectal adenocarcinoma s/p resection, and gastric bypass. 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  I seen her 6 weeks ago when she presented with worsening dyspnea and atypical chest pain in the form of chest tightness.  She underwent coronary angiography on 08/26/2020 and successful angioplasty to proximal LAD and mid LAD CTO.  She has noticed improvement in dyspnea and chest discomfort, no further chest pain or chest heaviness.  She continues remain very tired and exhausted as she does not sleep well due to frequent diarrhea.  Past Medical History:  Diagnosis Date   Anemia    few yrs ago   C. difficile diarrhea 03/2018   Cancer ([place])    rectal   CHF (congestive heart failure) ([place])    Coronary artery disease 07/2003   occluded LAD wtih right-to-left collaterals 07/24/03; no current cardiologist   Diabetes mellitus without complication ([place])    Elbow fracture, left    History of chemotherapy 01/2018   radaition last tx jan 2019   Myocardial infarction [facility]) age 39's   Obesity 2003   s/p gastric bypass    Primary hypertension 07/30/2020   Vitamin D deficiency    Wears glasses    Past Surgical History:  Procedure Laterality Date   ANAL RECTAL MANOMETRY N/A 11/01/2018   Procedure: ANO RECTAL MANOMETRY;  Surgeon: Leighton Ruff, MD;  Location: [facility];  Service: Endoscopy;  Laterality: N/A;   CARDIAC CATHETERIZATION     prior to 2015   [place] CTO INTERVENTION N/A 08/26/2020   Procedure: CORONARY CTO INTERVENTION;  Surgeon: Adrian Prows, MD;  Location: [place] [facility];  Service: Cardiovascular;  Laterality: N/A;   CORONARY STENT INTERVENTION N/A 08/26/2020   Procedure: CORONARY STENT INTERVENTION;  Surgeon: Adrian Prows, MD;  Location: [place] [facility];  Service: Cardiovascular;  Laterality: N/A;   GASTRIC BYPASS  2003   IR FLUORO GUIDE PORT INSERTION RIGHT  01/25/2017   IR REMOVAL TUN ACCESS W/ PORT W/O FL MOD SED  10/17/2018   IR US GUIDE VASC ACCESS RIGHT  01/25/2017   LEFT HEART CATH AND CORONARY ANGIOGRAPHY N/A 08/26/2020   Procedure: LEFT HEART CATH AND CORONARY ANGIOGRAPHY;  Surgeon: Adrian Prows, MD;  Location: [place] [facility];  Service: Cardiovascular;  Laterality: N/A;   ORIF HUMERUS FRACTURE Left 08/02/2019   Procedure: OPEN REDUCTION INTERNAL FIXATION (ORIF) LEFT ELBOW FRACTURE.;  Surgeon: Altamese Sundance, MD;  Location: [place];  Service: Orthopedics;  Laterality: Left;   ostomy placed and removed  12/2017   SACRAL NERVE STIMULATOR PLACEMENT  01-12-2019   DR Marcello Moores '@[facility]'$    TEST PHASE   Family History  Problem Relation Age of Onset   Heart attack Mother        d.93   Throat cancer Mother 84   Clotting disorder Mother    Liver disease Mother    Kidney disease Mother    Heart attack Father  d.92   Prostate cancer Father 69   Uterine cancer Sister 68       treated with total hysterectomy and radiation   Thyroid cancer Sister 44       treated with thyroidectomy   Uterine cancer Sister 50   Cancer Maternal Uncle        d.80s unspecified type of cancer. History of smoking.   Colon cancer Cousin 7       paternal first-cousin    Social History   Tobacco Use   Smoking status: Never   Smokeless tobacco: Never  Substance Use Topics   Alcohol use: No   Marital Status: Single  ROS  Review of Systems  Constitutional: Positive for weight gain. Negative for decreased appetite.  Cardiovascular:   Positive for claudication (with going up stairs), dyspnea on exertion, leg swelling and orthopnea. Negative for chest pain, near-syncope, palpitations and syncope.  Respiratory:  Positive for cough (feels like something is in her throat) and sleep disturbances due to breathing. Negative for shortness of breath.   Skin:  Positive for color change (in anterior lower legs).  Gastrointestinal:  Positive for diarrhea (hx of rectal cancer).  Neurological:  Positive for difficulty with concentration, dizziness and light-headedness.  Psychiatric/Behavioral:  The patient is nervous/anxious.   Objective  Blood pressure 112/60, pulse 70, height '5\' 1"'$  (1.549 m), weight 184 lb (83.5 kg), SpO2 98 %. Body mass index is 34.77 kg/m.  Vitals with BMI 09/16/2020 08/26/2020 08/26/2020  Height '5\' 1"'$  - -  Weight 184 lbs - -  BMI [id number] - -  Systolic [id number]  Diastolic 60 71 64  Pulse 70 - -    Physical Exam Constitutional:      Appearance: She is obese.  Neck:     Vascular: No carotid bruit or hepatojugular reflux.  Cardiovascular:     Rate and Rhythm: Normal rate and regular rhythm.     Pulses:          Radial pulses are 2+ on the right side and 2+ on the left side.       Femoral pulses are 2+ on the right side and 2+ on the left side.      Popliteal pulses are 2+ on the right side and 2+ on the left side.       Dorsalis pedis pulses are 2+ on the right side and 2+ on the left side.       Posterior tibial pulses are 0 on the right side and 0 on the left side.     Heart sounds: Normal heart sounds. No murmur heard.   No gallop.  Pulmonary:     Effort: Pulmonary effort is normal. No respiratory distress.     Breath sounds: Normal breath sounds. No wheezing, rhonchi or rales.  Abdominal:     General: Bowel sounds are normal.     Palpations: Abdomen is soft.     Tenderness: There is no abdominal tenderness.  Musculoskeletal:     Right lower leg: Edema present.     Left lower leg: Edema present.   Skin:    Findings: Erythema (to the anterior bilateral lower legs) present.  Neurological:     Mental Status: She is alert.  Psychiatric:        Mood and Affect: Mood is anxious.     Laboratory examination:   Recent Labs    10/02/19 0836 03/28/20 1329 07/30/20 1432  NA 139 143 139  K 4.1 4.0 4.4  CL 105 106 102  CO2 '28 26 28  '$ GLUCOSE 92 97 90  BUN '17 18 14  '$ CREATININE 0.85 0.96 0.74  CALCIUM 9.7 9.4 9.5  GFRNONAA >60 >60  --   GFRAA >60  --   --    CrCl cannot be calculated (Patient's most recent lab result is older than the maximum 21 days allowed.).  CMP Latest Ref Rng & Units 07/30/2020 03/28/2020 10/02/2019  Glucose 70 - 99 mg/dL 90 97 92  BUN 6 - 23 mg/dL '14 18 17  '$ Creatinine 0.40 - 1.20 mg/dL 0.74 0.96 0.85  Sodium 135 - 145 mEq/L 139 143 139  Potassium 3.5 - 5.1 mEq/L 4.4 4.0 4.1  Chloride 96 - 112 mEq/L 102 106 105  CO2 19 - 32 mEq/L '28 26 28  '$ Calcium 8.4 - 10.5 mg/dL 9.5 9.4 9.7  Total Protein 6.0 - 8.3 g/dL 7.0 7.0 7.3  Total Bilirubin 0.2 - 1.2 mg/dL 0.4 <0.2(L) 0.3  Alkaline Phos 39 - 117 U/L 67 64 67  AST 0 - 37 U/L '22 21 20  '$ ALT 0 - 35 U/L '21 24 22   '$ CBC Latest Ref Rng & Units 07/30/2020 10/02/2019 09/05/2019  WBC 4.0 - 10.5 K/uL 6.4 7.1 6.6  Hemoglobin 12.0 - 15.0 g/dL 12.0 12.2 12.0  Hematocrit 36.0 - 46.0 % 37.0 37.6 36.0  Platelets 150.0 - 400.0 K/uL 258.0 247 239    Lipid Panel No results for input(s): CHOL, TRIG, LDLCALC, VLDL, HDL, CHOLHDL, LDLDIRECT in the last 8760 hours.  Lipid Panel     Component Value Date/Time   CHOL 188 09/05/2019 0925   TRIG 147 09/05/2019 0925   HDL 67 09/05/2019 0925   CHOLHDL 2.8 09/05/2019 0925   CHOLHDL 3 11/19/2008 0833   VLDL 19.4 11/19/2008 0833   LDLCALC 96 09/05/2019 0925   LABVLDL 25 09/05/2019 0925     HEMOGLOBIN A1C Lab Results  Component Value Date   HGBA1C 5.3 09/05/2019   MPG 100 05/03/2013   TSH Recent Labs    07/30/20 1432  TSH 2.58    External labs:    Medications and allergies    Allergies  Allergen Reactions   Bactrim [Sulfamethoxazole-Trimethoprim] Anaphylaxis, Hives and Other (See Comments)   Phenergan [Promethazine Hcl]     Hives   Sulfa Antibiotics     anaphylaxis   Penicillins Other (See Comments)    REACTION: As a child.  CEPHALOSPORIN TOLERANT 08/02/2019     Medication prior to this encounter:   Outpatient Medications Prior to Visit  Medication Sig Dispense Refill   acetaminophen (TYLENOL) 500 MG tablet Take 1,000 mg by mouth every 8 (eight) hours as needed for moderate pain.     aspirin 81 MG chewable tablet Chew 1 tablet (81 mg total) by mouth daily. 90 tablet 1   cholecalciferol (VITAMIN D) 25 MCG (1000 UNIT) tablet Take 1,000 Units by mouth daily.     clobetasol ointment (TEMOVATE) [id number] % Apply 1 application topically 2 (two) times daily. (Patient taking differently: Apply 1 application topically daily.) 45 g 1   ferrous sulfate 325 (65 FE) MG tablet Take 324 mg by mouth daily with breakfast.     indapamide (LOZOL) 2.5 MG tablet Take 1 tablet (2.5 mg total) by mouth daily. 90 tablet 0   metoprolol tartrate (LOPRESSOR) 25 MG tablet Take 1 tablet (25 mg total) by mouth 2 (two) times daily. 60 tablet 2   Multiple Vitamin (MULTIVITAMIN) tablet Take 1 tablet by mouth daily.  nitroGLYCERIN (NITRODUR - DOSED IN MG/24 HR) 0.2 mg/hr patch Place 1 patch (0.2 mg total) onto the skin daily. 30 patch 3   olmesartan (BENICAR) 20 MG tablet Take 1 tablet (20 mg total) by mouth daily. 90 tablet 0   rosuvastatin (CRESTOR) 20 MG tablet Take 1 tablet (20 mg total) by mouth daily. 30 tablet 2   clopidogrel (PLAVIX) 75 MG tablet Take 1 tablet (75 mg total) by mouth daily. 30 tablet 0   clopidogrel (PLAVIX) 75 MG tablet Take 1 tablet (75 mg total) by mouth daily. 90 tablet 1   No facility-administered medications prior to visit.     Medication list after today's encounter   Current Outpatient Medications  Medication Instructions   acetaminophen (TYLENOL)  1,000 mg, Oral, Every 8 hours PRN   aspirin 81 mg, Oral, Daily   cholecalciferol (VITAMIN D) 1,000 Units, Oral, Daily   clobetasol ointment (TEMOVATE) [id number] % 1 application, Topical, 2 times daily   clopidogrel (PLAVIX) 75 mg, Oral, Daily   ferrous sulfate 324 mg, Oral, Daily with breakfast   indapamide (LOZOL) 2.5 mg, Oral, Daily   metoprolol tartrate (LOPRESSOR) 25 mg, Oral, 2 times daily   Multiple Vitamin (MULTIVITAMIN) tablet 1 tablet, Oral, Daily   nitroGLYCERIN (NITRODUR - DOSED IN MG/24 HR) 0.2 mg, Transdermal, Daily   olmesartan (BENICAR) 20 mg, Oral, Daily   rosuvastatin (CRESTOR) 20 mg, Oral, Daily    Radiology:   No results found. CT Chest 10/02/2018: 1. Redemonstrated postoperative findings of rectal resection and reanastomosis. No evidence of recurrent mass or abnormal soft tissue in the pelvis. 2. No evidence of metastatic disease in the chest, abdomen, or pelvis 3. There is a new, lobulated soft tissue nodule in the superficial left gluteal soft tissues measuring 3.0 x 2.2 cm (series 2, image 91). This is likely related to injection or trauma, and a very unlikely manifestation of metastatic disease. This can be further evaluated by ultrasound if desired. 4. 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 This can be further interrogated by fluoroscopy if desired. 5. Coronary artery disease. Aortic Atherosclerosis (ICD10-I70.0) and Emphysema (ICD10-J43.9).  CXR 08/02/2019:  Heart size within normal limits. No appreciable airspace consolidation or pulmonary edema. No evidence of pleural effusion or pneumothorax. No acute bony abnormality is identified.  CT scan of the chest and abdomen for colorectal cancer surveillance 10/02/2019: 1. Redemonstrated postoperative findings of rectal resection and reanastomosis. 2. No evidence of locally recurrent or metastatic disease in the chest, abdomen, or pelvis. 3.  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 4. Nonobstructive right nephrolithiasis. 5. Coronary artery disease.  Aortic Atherosclerosis  Cardiovascular: Aortic atherosclerosis. Normal heart size. three-vessel coronary artery calcifications. No pericardial effusion.   Cardiac Studies:   Echocardiogram 05/07/2013: Left ventricle: The cavity size was normal. Wall thickness  was increased in a pattern of mild LVH. Systolic function  was normal. The estimated ejection fraction was in the range  of 55% to 60%. Wall motion was normal; there were no  regional wall motion abnormalities. Doppler parameters are  consistent with abnormal left ventricular relaxation (grade  1 diastolic dysfunction).    Nuclear Stress Tests 08/08/2020 1. Fixed perfusion defect in mid to apical anterior/anteroseptal wall and apex with hypokinesis in this region, consistent with prior infarct 2. Fixed inferolateral perfusion defect with normal wall motion, consistent with artifact 3. Mild systolic dysfunction (EF [id number]) 4. Intermediate risk study, suggests LAD  territory infarct  Left heart catheterization 08/26/2020: LV: 112/-1, EDP 1 mmHg.  Ao 108/47, mean 70 mmHg.  There was no pressure gradient across the aortic valve.   Angiographic data: LV: Normal LV systolic function.  No wall motion abnormality.  LVEF 55 to 60%. RCA: Very large caliber vessel with mild luminal irregularity.  Dominant.  Gives origin to large PDA and a large PL branch.  There is collaterals noted from the right coronary artery to the distal LAD. LM: Large vessel, smooth. Circumflex: Large caliber vessel, moderate-sized OM1, mild disease. LAD: Large vessel proximally.  There is a 90 to 95% stenosis in the proximal LAD followed by origin of a very large D1 which is LAD equivalent.  Just after D1, there is a large septal perforator to and at the site of SP2, the LAD is  chronically occluded.  No significant collaterals were evident.   Intervention data: Successful PTCA and stenting of the proximal and mid LAD, mid LAD CTO with implantation of 3 overlapping stents from proximal to distal 3.0 x 18 mm Onyx 1.,  Mid segment with a 2.5 x 30 mm Onyx frontier and the edge dissection of the stent covered with a 2.0 x 12 mm Onyx frontier DES, stenosis reduced from 100% to 0% with TIMI 0 to TIMI-3 flow at the end of the procedure. 220 mm contrast utilized.  Patient to be discharged home today with outpatient follow-up, she will need aggressive risk factor modification, started on metoprolol 25 mg p.o. twice daily, Plavix 75 mg daily which she will need for 6 months along with aspirin 81 mg indefinitely.  EKG:   EKG 09/16/2020: Normal sinus rhythm at rate of 69 bpm, normal axis, T wave abnormality, cannot exclude anteroseptal ischemia.  Low-voltage complexes.  No significant change from 08/13/2020.  Assessment     ICD-10-CM   1. Atherosclerosis of native coronary artery of native heart with stable angina pectoris (HCC)  I25.118 clopidogrel (PLAVIX) 75 MG tablet    2. Status post angioplasty with stent  Z95.820 EKG 12-Lead    3. Dyspnea on exertion  R06.00     4. Primary hypertension  I10        Medications Discontinued During This Encounter  Medication Reason   clopidogrel (PLAVIX) 75 MG tablet Duplicate   clopidogrel (PLAVIX) 75 MG tablet Reorder    Meds ordered this encounter  Medications   clopidogrel (PLAVIX) 75 MG tablet    Sig: Take 1 tablet (75 mg total) by mouth daily.    Dispense:  90 tablet    Refill:  2    Orders Placed This Encounter  Procedures   EKG 12-Lead   Recommendations:   Mahoganie Inness is a 70 y.o. caucasian female with a past medical history significant for coronary atherosclerosis, hypertension, heart failure, myocardial infarction involving the LAD, hyperlipidemia, obesity, rectal adenocarcinoma, s/p resection with  colostomy, and gastric bypass. 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 She was last seen by me about 6 weeks ago, due to chest pain and abnormal nuclear stress test in spite of aggressive medical therapy, she underwent coronary angiography and was found to have high-grade proximal LAD stenosis followed by CTO in the midsegment  for which he underwent successful angioplasty on 08/26/2020 and now presents for follow-up.  Since angioplasty, she has not had recurrence of any further chest pains and dyspnea has improved.  She still continues to feel fatigued and is related to  markedly disturbed sleep due to frequent and chronic diarrhea as she has had rectal resection.  She is also started to gain weight after they had reversed her gastric bypass surgery to improve her diarrhea.  I discussed regarding primary prevention and secondary prevention and especially weight loss and to be watchful about increasing her calorie intake.  Advised her to use dual antiplatelet therapy for 6 months.  Lipids are well controlled, blood pressure is also well controlled.  Right radial arterial access site has healed well.  I will see her back in 6 months.  She will need lipid profile testing prior to her next office visit.    Adrian Prows, MD, [facility] 09/16/2020, 1:51 PM Office: [phone] Fax: [phone] Pager: [phone]

## 2020-09-26 ENCOUNTER — Ambulatory Visit (INDEPENDENT_AMBULATORY_CARE_PROVIDER_SITE_OTHER): Payer: Medicare HMO | Admitting: *Deleted

## 2020-09-26 DIAGNOSIS — Z0000 Encounter for general adult medical examination without abnormal findings: Secondary | ICD-10-CM

## 2020-09-26 NOTE — Patient Instructions (Addendum)
Health Maintenance, Female Adopting a healthy lifestyle and getting preventive care are important in promoting health and wellness. Ask your health care provider about: The right schedule for you to have regular tests and exams. Things you can do on your own to prevent diseases and keep yourself healthy. What should I know about diet, weight, and exercise? Eat a healthy diet  Eat a diet that includes plenty of vegetables, fruits, low-fat dairy products, and lean protein. Do not eat a lot of foods that are high in solid fats, added sugars, or sodium. Maintain a healthy weight Body mass index (BMI) is used to identify weight problems. It estimates body fat based on height and weight. Your health care provider can help determine your BMI and help you achieve or maintain a healthy weight. Get regular exercise Get regular exercise. This is one of the most important things you can do for your health. Most adults should: Exercise for at least 150 minutes each week. The exercise should increase your heart rate and make you sweat (moderate-intensity exercise). Do strengthening exercises at least twice a week. This is in addition to the moderate-intensity exercise. Spend less time sitting. Even light physical activity can be beneficial. Watch cholesterol and blood lipids Have your blood tested for lipids and cholesterol at 68 years of age, then have this test every 5 years. Have your cholesterol levels checked more often if: Your lipid or cholesterol levels are high. You are older than 68 years of age. You are at high risk for heart disease. What should I know about cancer screening? Depending on your health history and family history, you may need to have cancer screening at various ages. This may include screening for: Breast cancer. Cervical cancer. Colorectal cancer. Skin cancer. Lung cancer. What should I know about heart disease, diabetes, and high blood pressure? Blood pressure and heart  disease High blood pressure causes heart disease and increases the risk of stroke. This is more likely to develop in people who have high blood pressure readings, are of African descent, or are overweight. Have your blood pressure checked: Every 3-5 years if you are 18-39 years of age. Every year if you are 40 years old or older. Diabetes Have regular diabetes screenings. This checks your fasting blood sugar level. Have the screening done: Once every three years after age 40 if you are at a normal weight and have a low risk for diabetes. More often and at a younger age if you are overweight or have a high risk for diabetes. What should I know about preventing infection? Hepatitis B If you have a higher risk for hepatitis B, you should be screened for this virus. Talk with your health care provider to find out if you are at risk for hepatitis B infection. Hepatitis C Testing is recommended for: Everyone born from 1945 through 1965. Anyone with known risk factors for hepatitis C. Sexually transmitted infections (STIs) Get screened for STIs, including gonorrhea and chlamydia, if: You are sexually active and are younger than 68 years of age. You are older than 68 years of age and your health care provider tells you that you are at risk for this type of infection. Your sexual activity has changed since you were last screened, and you are at increased risk for chlamydia or gonorrhea. Ask your health care provider if you are at risk. Ask your health care provider about whether you are at high risk for HIV. Your health care provider may recommend a prescription medicine   to help prevent HIV infection. If you choose to take medicine to prevent HIV, you should first get tested for HIV. You should then be tested every 3 months for as long as you are taking the medicine. Pregnancy If you are about to stop having your period (premenopausal) and you may become pregnant, seek counseling before you get  pregnant. Take 400 to 800 micrograms (mcg) of folic acid every day if you become pregnant. Ask for birth control (contraception) if you want to prevent pregnancy. Osteoporosis and menopause Osteoporosis is a disease in which the bones lose minerals and strength with aging. This can result in bone fractures. If you are 65 years old or older, or if you are at risk for osteoporosis and fractures, ask your health care provider if you should: Be screened for bone loss. Take a calcium or vitamin D supplement to lower your risk of fractures. Be given hormone replacement therapy (HRT) to treat symptoms of menopause. Follow these instructions at home: Lifestyle Do not use any products that contain nicotine or tobacco, such as cigarettes, e-cigarettes, and chewing tobacco. If you need help quitting, ask your health care provider. Do not use street drugs. Do not share needles. Ask your health care provider for help if you need support or information about quitting drugs. Alcohol use Do not drink alcohol if: Your health care provider tells you not to drink. You are pregnant, may be pregnant, or are planning to become pregnant. If you drink alcohol: Limit how much you use to 0-1 drink a day. Limit intake if you are breastfeeding. Be aware of how much alcohol is in your drink. In the [place] one drink equals one 12 oz bottle of beer (355 mL), one 5 oz glass of wine (148 mL), or one 1 oz glass of hard liquor (44 mL). General instructions Schedule regular health, dental, and eye exams. Stay current with your vaccines. Tell your health care provider if: You often feel depressed. You have ever been abused or do not feel safe at home. Summary Adopting a healthy lifestyle and getting preventive care are important in promoting health and wellness. Follow your health care provider's instructions about healthy diet, exercising, and getting tested or screened for diseases. Follow your health care provider's  instructions on monitoring your cholesterol and blood pressure. This information is not intended to replace advice given to you by your health care provider. Make sure you discuss any questions you have with your health care provider. Document Revised: 02/29/2020 Document Reviewed: 12/14/2017 Elsevier Patient Education  2022 [employer]  

## 2020-09-26 NOTE — Progress Notes (Signed)
Subjective:   Tonya [name] is a 70 y.o. female who presents for Medicare Annual (Subsequent) preventive examination.  I connected with  Tonya [name] on 09/26/20 by audio enabled telemedicine application and verified that I am speaking with the correct person using two identifiers.   I discussed the limitations of evaluation and management by telemedicine. The patient expressed understanding and agreed to proceed.   Location of Patient: Home Location of Provider: Office Persons participating in visit: [name] (patient) & Jari Favre, CMA  Review of Systems    Defer to PCP       Objective:    There were no vitals filed for this visit. There is no height or weight on file to calculate BMI.  Advanced Directives 09/26/2020 08/26/2020 08/02/2019 07/14/2019 04/05/2019 01/19/2019 01/12/2019  Does Patient Have a Medical Advance Directive? Yes Yes No No Yes Yes Yes  Type of Industrial/product designer of [place];Living will - - - [place];Living will [place];Living will  Does patient want to make changes to medical advance directive? - No - Patient declined - - - - No - Patient declined  Copy of [place] in Chart? Yes - validated most recent copy scanned in chart (See row information) - - - - - Yes - validated most recent copy scanned in chart (See row information)  Would patient like information on creating a medical advance directive? - - No - Patient declined - - - -  Pre-existing out of facility DNR order (yellow form or pink MOST form) - - - - - - -    Current Medications (verified) Outpatient Encounter Medications as of 09/26/2020  Medication Sig   acetaminophen (TYLENOL) 500 MG tablet Take 1,000 mg by mouth every 8 (eight) hours as needed for moderate pain.   aspirin 81 MG chewable tablet Chew 1 tablet (81 mg total) by mouth daily.   cholecalciferol (VITAMIN D) 25 MCG (1000 UNIT)  tablet Take 1,000 Units by mouth daily.   clobetasol ointment (TEMOVATE) 3.61 % Apply 1 application topically 2 (two) times daily. (Patient taking differently: Apply 1 application topically daily.)   clopidogrel (PLAVIX) 75 MG tablet Take 1 tablet (75 mg total) by mouth daily.   ferrous sulfate 325 (65 FE) MG tablet Take 324 mg by mouth daily with breakfast.   indapamide (LOZOL) 2.5 MG tablet Take 1 tablet (2.5 mg total) by mouth daily.   metoprolol tartrate (LOPRESSOR) 25 MG tablet Take 1 tablet (25 mg total) by mouth 2 (two) times daily.   Multiple Vitamin (MULTIVITAMIN) tablet Take 1 tablet by mouth daily.   nitroGLYCERIN (NITRODUR - DOSED IN MG/24 HR) 0.2 mg/hr patch Place 1 patch (0.2 mg total) onto the skin daily.   olmesartan (BENICAR) 20 MG tablet Take 1 tablet (20 mg total) by mouth daily.   rosuvastatin (CRESTOR) 20 MG tablet Take 1 tablet (20 mg total) by mouth daily.   No facility-administered encounter medications on file as of 09/26/2020.    Allergies (verified) Bactrim [sulfamethoxazole-trimethoprim], Phenergan [promethazine hcl], Sulfa antibiotics, and Penicillins   History: Past Medical History:  Diagnosis Date   Anemia    few yrs ago   C. difficile diarrhea 03/2018   Cancer ([place])    rectal   CHF (congestive heart failure) ([place])    Coronary artery disease 07/2003   occluded LAD wtih right-to-left collaterals 07/24/03; no current cardiologist   Diabetes mellitus without complication ([place])  Elbow fracture, left    History of chemotherapy 01/2018   radaition last tx jan 2019   Myocardial infarction [facility]) age 66's   Obesity 2003   s/p gastric bypass    Primary hypertension 07/30/2020   Vitamin D deficiency    Wears glasses    Past Surgical History:  Procedure Laterality Date   ANAL RECTAL MANOMETRY N/A 11/01/2018   Procedure: ANO RECTAL MANOMETRY;  Surgeon: Leighton Ruff, MD;  Location: [facility];  Service: Endoscopy;  Laterality: N/A;   CARDIAC  CATHETERIZATION     prior to 2015   [place] CTO INTERVENTION N/A 08/26/2020   Procedure: CORONARY CTO INTERVENTION;  Surgeon: Adrian Prows, MD;  Location: [place] [facility];  Service: Cardiovascular;  Laterality: N/A;   CORONARY STENT INTERVENTION N/A 08/26/2020   Procedure: CORONARY STENT INTERVENTION;  Surgeon: Adrian Prows, MD;  Location: [place] [facility];  Service: Cardiovascular;  Laterality: N/A;   GASTRIC BYPASS  2003   IR FLUORO GUIDE PORT INSERTION RIGHT  01/25/2017   IR REMOVAL TUN ACCESS W/ PORT W/O FL MOD SED  10/17/2018   IR US GUIDE VASC ACCESS RIGHT  01/25/2017   LEFT HEART CATH AND CORONARY ANGIOGRAPHY N/A 08/26/2020   Procedure: LEFT HEART CATH AND CORONARY ANGIOGRAPHY;  Surgeon: Adrian Prows, MD;  Location: [place] [facility];  Service: Cardiovascular;  Laterality: N/A;   ORIF HUMERUS FRACTURE Left 08/02/2019   Procedure: OPEN REDUCTION INTERNAL FIXATION (ORIF) LEFT ELBOW FRACTURE.;  Surgeon: Altamese [place], MD;  Location: [place];  Service: Orthopedics;  Laterality: Left;   ostomy placed and removed  12/2017   SACRAL NERVE STIMULATOR PLACEMENT  01-12-2019   DR Marcello Moores @[facility]    TEST PHASE   Family History  Problem Relation Age of Onset   Heart attack Mother        d.93   Throat cancer Mother 87   Clotting disorder Mother    Liver disease Mother    Kidney disease Mother    Heart attack Father        d.92   Prostate cancer Father 64   Uterine cancer Sister 75       treated with total hysterectomy and radiation   Thyroid cancer Sister 61       treated with thyroidectomy   Uterine cancer Sister 73   Cancer Maternal Uncle        d.80s unspecified type of cancer. History of smoking.   Colon cancer Cousin 70       paternal first-cousin   Social History   Socioeconomic History   Marital status: Single    Spouse name: Not on file   Number of children: 0   Years of education: Not on file   Highest education level: Not on file  Occupational History    Occupation: retired  Tobacco Use   Smoking status: Never   Smokeless tobacco: Never  Vaping Use   Vaping Use: Never used  Substance and Sexual Activity   Alcohol use: No   Drug use: No   Sexual activity: Not on file  Other Topics Concern   Not on file  Social History Narrative   Not on file   Social Determinants of Health   Financial Resource Strain: Low Risk    Difficulty of Paying Living Expenses: Not hard at all  Food Insecurity: No Food Insecurity   Worried About [place] in the Last Year: Never true   Ran Out of  Food in the Last Year: Never true  Transportation Needs: No Transportation Needs   Lack of Transportation (Medical): No   Lack of Transportation (Non-Medical): No  Physical Activity: Insufficiently Active   Days of Exercise per Week: 2 days   Minutes of Exercise per Session: 10 min  Stress: No Stress Concern Present   Feeling of Stress : Not at all  Social Connections: Socially Isolated   Frequency of Communication with Friends and Family: More than three times a week   Frequency of Social Gatherings with Friends and Family: Once a week   Attends Religious Services: Never   Marine scientist or Organizations: No   Attends Music therapist: Never   Marital Status: Never married    Tobacco Counseling Counseling given: Not Answered   Clinical Intake:     Pain : No/denies pain     BMI - recorded: 34.75 Nutritional Status: BMI > 30  Obese Diabetes: No  How often do you need to have someone help you when you read instructions, pamphlets, or other written materials from your doctor or pharmacy?: 1 - Never  Diabetic? No  Interpreter Needed?: No      Activities of Daily Living In your present state of health, do you have any difficulty performing the following activities: 09/26/2020 07/30/2020  Hearing? N N  Vision? N N  Difficulty concentrating or making decisions? N N  Walking or climbing stairs? N N  Dressing  or bathing? N N  Doing errands, shopping? N N  Preparing Food and eating ? N -  Using the Toilet? N -  In the past six months, have you accidently leaked urine? N -  Do you have problems with loss of bowel control? Y -  Comment H/O Rectal cancer -  Managing your Medications? N -  Managing your Finances? N -  Housekeeping or managing your Housekeeping? N -  Some recent data might be hidden    Patient Care Team: [name], MD as PCP - General (Internal Medicine) Irene Shipper, MD as Consulting Physician (Gastroenterology) Leighton Ruff, MD as Consulting Physician (General Surgery) Truitt Merle, MD as Consulting Physician (Hematology) Kyung Rudd, MD as Consulting Physician (Radiation Oncology)  Indicate any recent Medical Services you may have received from other than [facility] providers in the past year (date may be approximate).     Assessment:   This is a routine wellness examination for Sibel.  Hearing/Vision screen No results found.  Dietary issues and exercise activities discussed: Current Exercise Habits: Home exercise routine, Type of exercise: walking, Time (Minutes): 10, Frequency (Times/Week): 4, Weekly Exercise (Minutes/Week): 40, Intensity: Mild, Exercise limited by: None identified   Goals Addressed   None    Depression Screen PHQ 2/9 Scores 09/26/2020 07/30/2020 05/28/2020 05/28/2020 04/23/2020 04/22/2020 02/13/2020  PHQ - 2 Score 0 0 0 0 0 0 3  PHQ- 9 Score - - 1 - - - 10    Fall Risk Fall Risk  09/26/2020 07/30/2020 05/28/2020 04/23/2020 04/22/2020  Falls in the past year? 0 1 1 0 -  Number falls in past yr: 0 0 1 0 0  Injury with Fall? 0 1 1 0 1  Risk for fall due to : - - Impaired balance/gait;History of fall(s) No Fall Risks Impaired balance/gait  Follow up - - Falls evaluation completed Falls evaluation completed Falls evaluation completed    FALL RISK PREVENTION PERTAINING TO THE HOME:  Any stairs in or around the home? No  If so, are there any without  handrails? No  Home free of loose throw rugs in walkways, pet beds, electrical cords, etc? Yes Adequate lighting in your home to reduce risk of falls? Yes   ASSISTIVE DEVICES UTILIZED TO PREVENT FALLS:  Life alert? No  Use of a cane, walker or w/c? No  Grab bars in the bathroom? Yes  Shower chair or bench in shower? Yes  Elevated toilet seat or a handicapped toilet? No   TIMED UP AND GO:  Was the test performed?  n/a .  Length of time to ambulate 10 feet: n/a sec.     Cognitive Function:     6CIT Screen 09/26/2020  What Year? 0 points  What month? 0 points  What time? 0 points  Count back from 20 0 points  Months in reverse 0 points  Repeat phrase 0 points  Total Score 0    Immunizations Immunization History  Administered Date(s) Administered   Fluad Quad(high Dose 65+) 10/02/2018   Influenza Inj Mdck Quad With Preservative 01/21/2017   PFIZER(Purple Top)SARS-COV-2 Vaccination 02/08/2019, 03/05/2019, 09/30/2019, 05/17/2020   Pneumococcal Polysaccharide-23 06/16/2017, 09/05/2019   Tdap 07/14/2019    TDAP status: Up to date  Flu Vaccine status: Due, Education has been provided regarding the importance of this vaccine. Advised may receive this vaccine at local pharmacy or Health Dept. Aware to provide a copy of the vaccination record if obtained from local pharmacy or Health Dept. Verbalized acceptance and understanding.  Pneumococcal vaccine status: Up to date  Covid-19 vaccine status: Completed vaccines  Qualifies for Shingles Vaccine? Yes   Zostavax completed No   Shingrix Completed?: No.    Education has been provided regarding the importance of this vaccine. Patient has been advised to call insurance company to determine out of pocket expense if they have not yet received this vaccine. Advised may also receive vaccine at local pharmacy or Health Dept. Verbalized acceptance and understanding.  Screening Tests Health Maintenance  Topic Date Due   FOOT EXAM   Never done   OPHTHALMOLOGY EXAM  Never done   Hepatitis C Screening  Never done   Zoster Vaccines- Shingrix (1 of 2) Never done   COLONOSCOPY (Pts 45-32yrs Insurance coverage will need to be confirmed)  06/28/2019   HEMOGLOBIN A1C  03/04/2020   COVID-19 Vaccine (5 - Booster for Pfizer series) 10/12/2020 (Originally 09/17/2020)   INFLUENZA VACCINE  04/03/2021 (Originally 08/04/2020)   MAMMOGRAM  03/07/2022   TETANUS/TDAP  07/13/2029   DEXA SCAN  Completed   HPV VACCINES  Aged Out    Health Maintenance  Health Maintenance Due  Topic Date Due   FOOT EXAM  Never done   OPHTHALMOLOGY EXAM  Never done   Hepatitis C Screening  Never done   Zoster Vaccines- Shingrix (1 of 2) Never done   COLONOSCOPY (Pts 45-73yrs Insurance coverage will need to be confirmed)  06/28/2019   HEMOGLOBIN A1C  03/04/2020    Colorectal cancer screening: Type of screening: Colonoscopy. Completed 06/28/2018. Repeat every n/a years  Mammogram status: Completed 03/06/2020. Repeat every year  Bone Density status: Completed 06/24/2009. Results reflect: Bone density results: OSTEOPENIA. Repeat every 2 years.  Lung Cancer Screening: (Low Dose CT Chest recommended if Age 81-80 years, 30 pack-year currently smoking OR have quit w/in 15years.) does not qualify.    Additional Screening:  Hepatitis C Screening: does qualify; Not Completed  Vision Screening: Recommended annual ophthalmology exams for early detection of glaucoma and other disorders of the eye.  Is the patient up to date with their annual eye exam?  Yes  Who is the provider or what is the name of the office in which the patient attends annual eye exams? [facility] If pt is not established with a provider, would they like to be referred to a provider to establish care? No .   Dental Screening: Recommended annual dental exams for proper oral hygiene  Community Resource Referral / Chronic Care Management: CRR required this visit?  No   CCM  required this visit?  No      Plan:     I have personally reviewed and noted the following in the patient's chart:   Medical and social history Use of alcohol, tobacco or illicit drugs  Current medications and supplements including opioid prescriptions.  Functional ability and status Nutritional status Physical activity Advanced directives List of other physicians Hospitalizations, surgeries, and ER visits in previous 12 months Vitals Screenings to include cognitive, depression, and falls Referrals and appointments  In addition, I have reviewed and discussed with patient certain preventive protocols, quality metrics, and best practice recommendations. A written personalized care plan for preventive services as well as general preventive health recommendations were provided to patient.     Cannon Kettle, [place]   09/26/2020   Nurse Notes: 11 minutes non face to face   Ms. Ripoll , Thank you for taking time to come for your Medicare Wellness Visit. I appreciate your ongoing commitment to your health goals. Please review the following plan we discussed and let me know if I can assist you in the future.   These are the goals we discussed:  Goals   None     This is a list of the screening recommended for you and due dates:  Health Maintenance  Topic Date Due   Complete foot exam   Never done   Eye exam for diabetics  Never done   Hepatitis C Screening: USPSTF Recommendation to screen - Ages 78-79 yo.  Never done   Zoster (Shingles) Vaccine (1 of 2) Never done   Colon Cancer Screening  06/28/2019   Hemoglobin A1C  03/04/2020   COVID-19 Vaccine (5 - Booster for Pfizer series) 10/12/2020*   Flu Shot  04/03/2021*   Mammogram  03/07/2022   Tetanus Vaccine  07/13/2029   DEXA scan (bone density measurement)  Completed   HPV Vaccine  Aged Out  *Topic was postponed. The date shown is not the original due date.

## 2020-10-03 ENCOUNTER — Ambulatory Visit: Payer: Medicare HMO

## 2020-10-16 ENCOUNTER — Other Ambulatory Visit (INDEPENDENT_AMBULATORY_CARE_PROVIDER_SITE_OTHER): Payer: Medicare HMO

## 2020-10-16 ENCOUNTER — Ambulatory Visit (INDEPENDENT_AMBULATORY_CARE_PROVIDER_SITE_OTHER): Payer: Medicare HMO | Admitting: Internal Medicine

## 2020-10-16 ENCOUNTER — Encounter: Payer: Self-pay | Admitting: Internal Medicine

## 2020-10-16 ENCOUNTER — Other Ambulatory Visit: Payer: Self-pay

## 2020-10-16 VITALS — BP 128/70 | HR 58 | Temp 98.9°F | Ht 61.0 in | Wt 186.0 lb

## 2020-10-16 DIAGNOSIS — E559 Vitamin D deficiency, unspecified: Secondary | ICD-10-CM | POA: Diagnosis not present

## 2020-10-16 DIAGNOSIS — K529 Noninfective gastroenteritis and colitis, unspecified: Secondary | ICD-10-CM | POA: Diagnosis not present

## 2020-10-16 DIAGNOSIS — R197 Diarrhea, unspecified: Secondary | ICD-10-CM | POA: Diagnosis not present

## 2020-10-16 DIAGNOSIS — I10 Essential (primary) hypertension: Secondary | ICD-10-CM | POA: Diagnosis not present

## 2020-10-16 LAB — CBC WITH DIFFERENTIAL/PLATELET
Basophils Absolute: 0 10*3/uL (ref 0.0–0.1)
Basophils Relative: 0.6 % (ref 0.0–3.0)
Eosinophils Absolute: 0.1 10*3/uL (ref 0.0–0.7)
Eosinophils Relative: 0.9 % (ref 0.0–5.0)
HCT: 33.9 % — ABNORMAL LOW (ref 36.0–46.0)
Hemoglobin: 11.1 g/dL — ABNORMAL LOW (ref 12.0–15.0)
Lymphocytes Relative: 12.4 % (ref 12.0–46.0)
Lymphs Abs: 0.7 10*3/uL (ref 0.7–4.0)
MCHC: 32.7 g/dL (ref 30.0–36.0)
MCV: 94 fl (ref 78.0–100.0)
Monocytes Absolute: 0.5 10*3/uL (ref 0.1–1.0)
Monocytes Relative: 9.4 % (ref 3.0–12.0)
Neutro Abs: 4.5 10*3/uL (ref 1.4–7.7)
Neutrophils Relative %: 76.7 % (ref 43.0–77.0)
Platelets: 226 10*3/uL (ref 150.0–400.0)
RBC: 3.61 Mil/uL — ABNORMAL LOW (ref 3.87–5.11)
RDW: 13.7 % (ref 11.5–15.5)
WBC: 5.8 10*3/uL (ref 4.0–10.5)

## 2020-10-16 NOTE — Progress Notes (Signed)
Patient ID: [name], female   DOB: August 20, 1950, 70 y.o.   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 female here with c/o above, as for 1 wk has unusual change in odor, color, frequency of stool, more gas, though no increased abd pain, fever, blood or recent antibiotx.  In addition has seen GI recently as well -        Shirah Roseman is a 70 y.o. 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  In addition she developed rectal cancer status postchemotherapy and radiation in 2018 with a low anterior resection with a loop ileostomy. Her loop ileostomy was eventually reversed due to excessive noise in the ostomy site and unpleasant smells. Following her loop ileostomy reversal her diarrhea and fecal incontinence returned.  She also had a modification of her duodenal switch which did increase the size of her common channel from 75 cm to 250 cm. Her hospital course at that time was complicated by a C. difficile infection requiring multiple courses of antibiotics.  she underwent a sacral nerve stimulator implant approximately 1 years ago for explosive diarrhea and unfortunately this did not improve her symptoms.  She still continues to have diarrhea and has no feeling prior to bowel movements. She has involuntary stools and fecal incontinence. In addition she reports nocturnal episodes of bowel movements.  Ms. Gildardo Griffes has also undergone a trial of octreotide without any improvement in her diarrhea.   Taking Vit d.    Wt Readings from Last 3 Encounters:  10/16/20 186 lb (84.4 kg)  09/16/20 184 lb (83.5 kg)  08/26/20 180 lb (81.6 kg)   BP Readings from Last 3 Encounters:  10/16/20 128/70  09/16/20 112/60  08/26/20 118/71         Past Medical History:  Diagnosis Date   Anemia    few yrs ago   C.  difficile diarrhea 03/2018   Cancer ([place])    rectal   CHF (congestive heart failure) ([place])    Coronary artery disease 07/2003   occluded LAD wtih right-to-left collaterals 07/24/03; no current cardiologist   Diabetes mellitus without complication ([place])    Elbow fracture, left    History of chemotherapy 01/2018   radaition last tx jan 2019   Myocardial infarction [facility]) age 89's   Obesity 2003   s/p gastric bypass    Primary hypertension 07/30/2020   Vitamin D deficiency    Wears glasses    Past Surgical History:  Procedure Laterality Date   ANAL RECTAL MANOMETRY N/A 11/01/2018   Procedure: ANO RECTAL MANOMETRY;  Surgeon: Leighton Ruff, MD;  Location: [facility];  Service: Endoscopy;  Laterality: N/A;   CARDIAC CATHETERIZATION     prior to 2015   [place] CTO INTERVENTION N/A 08/26/2020   Procedure: CORONARY CTO INTERVENTION;  Surgeon: Adrian Prows, MD;  Location: [place] [facility];  Service: Cardiovascular;  Laterality: N/A;   CORONARY STENT INTERVENTION N/A 08/26/2020   Procedure: CORONARY STENT INTERVENTION;  Surgeon: Adrian Prows, MD;  Location: [place] [facility];  Service: Cardiovascular;  Laterality: N/A;   GASTRIC BYPASS  2003   IR FLUORO GUIDE PORT INSERTION RIGHT  01/25/2017   IR REMOVAL TUN ACCESS W/ PORT W/O FL MOD SED  10/17/2018   IR US GUIDE VASC ACCESS RIGHT  01/25/2017   LEFT HEART CATH AND CORONARY ANGIOGRAPHY N/A 08/26/2020   Procedure: LEFT HEART CATH AND CORONARY ANGIOGRAPHY;  Surgeon: Adrian Prows, MD;  Location: [place] [facility];  Service: Cardiovascular;  Laterality: N/A;   ORIF HUMERUS FRACTURE Left 08/02/2019   Procedure: OPEN REDUCTION INTERNAL FIXATION (ORIF) LEFT ELBOW FRACTURE.;  Surgeon: Altamese Ignacio, MD;  Location: [place];  Service: Orthopedics;  Laterality: Left;   ostomy placed and removed  12/2017   SACRAL NERVE STIMULATOR PLACEMENT  01-12-2019   DR Marcello Moores @[facility]    TEST PHASE    reports that she has never smoked. She has never  used smokeless tobacco. She reports that she does not drink alcohol and does not use drugs. family history includes Cancer in her maternal uncle; Clotting disorder in her mother; Colon cancer (age of onset: 73) in her cousin; Heart attack in her father and mother; Kidney disease in her mother; Liver disease in her mother; Prostate cancer (age of onset: 45) in her father; Throat cancer (age of onset: 38) in her mother; Thyroid cancer (age of onset: 10) in her sister; Uterine cancer (age of onset: 59) in her sister; Uterine cancer (age of onset: 9) in her sister. Allergies  Allergen Reactions   Bactrim [Sulfamethoxazole-Trimethoprim] Anaphylaxis, Hives and Other (See Comments)   Phenergan [Promethazine Hcl]     Hives   Sulfa Antibiotics     anaphylaxis   Penicillins Other (See Comments)    REACTION: As a child.  CEPHALOSPORIN TOLERANT 08/02/2019   Current Outpatient Medications on File Prior to Visit  Medication Sig Dispense Refill   acetaminophen (TYLENOL) 500 MG tablet Take 1,000 mg by mouth every 8 (eight) hours as needed for moderate pain.     aspirin 81 MG chewable tablet Chew 1 tablet (81 mg total) by mouth daily. 90 tablet 1   cholecalciferol (VITAMIN D) 25 MCG (1000 UNIT) tablet Take 1,000 Units by mouth daily.     clobetasol ointment (TEMOVATE) 3.84 % Apply 1 application topically 2 (two) times daily. (Patient taking differently: Apply 1 application topically daily.) 45 g 1   clopidogrel (PLAVIX) 75 MG tablet Take 1 tablet (75 mg total) by mouth daily. 90 tablet 2   ferrous sulfate 325 (65 FE) MG tablet Take 324 mg by mouth daily with breakfast.     indapamide (LOZOL) 2.5 MG tablet Take 1 tablet (2.5 mg total) by mouth daily. 90 tablet 0   metoprolol tartrate (LOPRESSOR) 25 MG tablet Take 1 tablet (25 mg total) by mouth 2 (two) times daily. 60 tablet 2   Multiple Vitamin (MULTIVITAMIN) tablet Take 1 tablet by mouth daily.     nitroGLYCERIN (NITRODUR - DOSED IN MG/24 HR) 0.2 mg/hr  patch Place 1 patch (0.2 mg total) onto the skin daily. 30 patch 3   olmesartan (BENICAR) 20 MG tablet Take 1 tablet (20 mg total) by mouth daily. 90 tablet 0   rosuvastatin (CRESTOR) 20 MG tablet Take 1 tablet (20 mg total) by mouth daily. 30 tablet 2   No current facility-administered medications on file prior to visit.        ROS:  All others reviewed and negative.  Objective        PE:  BP 128/70 (BP Location: Right Arm, Patient Position: Sitting, Cuff Size: Large)   Pulse (!) 58   Temp 98.9 F (37.2 C) (Oral)   Ht 5\' 1"  (1.549 m)   Wt 186 lb (  84.4 kg)   SpO2 99%   BMI 35.14 kg/m                 Constitutional: Pt appears in NAD               HENT: Head: NCAT.                Right Ear: External ear normal.                 Left Ear: External ear normal.                Eyes: . Pupils are equal, round, and reactive to light. Conjunctivae and EOM are normal               Nose: without d/c or deformity               Neck: Neck supple. Gross normal ROM               Cardiovascular: Normal rate and regular rhythm.                 Pulmonary/Chest: Effort normal and breath sounds without rales or wheezing.                Abd:  Soft, NT, ND, + BS, no organomegaly               Neurological: Pt is alert. At baseline orientation, motor grossly intact               Skin: Skin is warm. No rashes, no other new lesions, LE edema -  none               Psychiatric: Pt behavior is normal without agitation   Micro: none  Cardiac tracings I have personally interpreted today:  none  Pertinent Radiological findings (summarize): none   Lab Results  Component Value Date   WBC 5.8 10/16/2020   HGB 11.1 (L) 10/16/2020   HCT 33.9 (L) 10/16/2020   PLT 226.0 10/16/2020   GLUCOSE 96 10/16/2020   CHOL 188 09/05/2019   TRIG 147 09/05/2019   HDL 67 09/05/2019   LDLCALC 96 09/05/2019   ALT 23 10/16/2020   AST 21 10/16/2020   NA 139 10/16/2020   K 4.7 10/16/2020   CL 106 10/16/2020    CREATININE 0.88 10/16/2020   BUN 33 (H) 10/16/2020   CO2 24 10/16/2020   TSH 2.58 07/30/2020   INR 1.0 08/02/2019   HGBA1C 5.3 09/05/2019   Assessment/Plan:  Kayliana Codd is a 70 y.o. White or Caucasian [1] female with  has a past medical history of Anemia, C. difficile diarrhea (03/2018), Cancer ([place]), CHF (congestive heart failure) ([place]), Coronary artery disease (07/2003), Diabetes mellitus without complication ([place]), Elbow fracture, left, History of chemotherapy (01/2018), Myocardial infarction [facility]) (age 58's), Obesity (2003), Primary hypertension (07/30/2020), Vitamin D deficiency, and Wears glasses.  Diarrhea Etiology unclear, a few characterics have changes and not overly worrisome for recurent c diff but will defer to pt for C diff PCR testing, and GI panel.    Vitamin D deficiency Last vitamin D Lab Results  Component Value Date   VD25OH 41.30 08/02/2019   Stable, cont oral replacement  Primary hypertension BP Readings from Last 3 Encounters:  10/16/20 128/70  09/16/20 112/60  08/26/20 118/71   Stable, pt to continue medical treatment lozol, benicar, lopressor  Followup: Return if symptoms worsen or fail to improve.  Jeneen Rinks  Jenny Reichmann, MD 10/18/2020 7:52 PM [facility]

## 2020-10-16 NOTE — Patient Instructions (Signed)
Please continue all other medications as before, and refills have been done if requested.  Please have the pharmacy call with any other refills you may need.  Please keep your appointments with your specialists as you may have planned  Please go to the LAB at the blood drawing area for the tests to be done  You will be contacted by phone if any changes need to be made immediately.  Otherwise, you will receive a letter about your results with an explanation, but please check with MyChart first.  Please remember to sign up for MyChart if you have not done so, as this will be important to you in the future with finding out test results, communicating by private email, and scheduling acute appointments online when needed.      

## 2020-10-17 ENCOUNTER — Encounter: Payer: Self-pay | Admitting: Internal Medicine

## 2020-10-17 DIAGNOSIS — K529 Noninfective gastroenteritis and colitis, unspecified: Secondary | ICD-10-CM | POA: Diagnosis not present

## 2020-10-17 LAB — HEPATIC FUNCTION PANEL
ALT: 23 U/L (ref 0–35)
AST: 21 U/L (ref 0–37)
Albumin: 4 g/dL (ref 3.5–5.2)
Alkaline Phosphatase: 56 U/L (ref 39–117)
Bilirubin, Direct: 0 mg/dL (ref 0.0–0.3)
Total Bilirubin: 0.2 mg/dL (ref 0.2–1.2)
Total Protein: 6.9 g/dL (ref 6.0–8.3)

## 2020-10-17 LAB — BASIC METABOLIC PANEL
BUN: 33 mg/dL — ABNORMAL HIGH (ref 6–23)
CO2: 24 mEq/L (ref 19–32)
Calcium: 9.4 mg/dL (ref 8.4–10.5)
Chloride: 106 mEq/L (ref 96–112)
Creatinine, Ser: 0.88 mg/dL (ref 0.40–1.20)
GFR: 67.52 mL/min (ref >60.00)
Glucose, Bld: 96 mg/dL (ref 70–99)
Potassium: 4.7 mEq/L (ref 3.5–5.1)
Sodium: 139 mEq/L (ref 135–145)

## 2020-10-18 ENCOUNTER — Encounter: Payer: Self-pay | Admitting: Internal Medicine

## 2020-10-18 DIAGNOSIS — R197 Diarrhea, unspecified: Secondary | ICD-10-CM | POA: Insufficient documentation

## 2020-10-18 DIAGNOSIS — K529 Noninfective gastroenteritis and colitis, unspecified: Secondary | ICD-10-CM | POA: Insufficient documentation

## 2020-10-18 NOTE — Assessment & Plan Note (Signed)
Etiology unclear, a few characterics have changes and not overly worrisome for recurent c diff but will defer to pt for C diff PCR testing, and GI panel.

## 2020-10-18 NOTE — Assessment & Plan Note (Signed)
Last vitamin D Lab Results  Component Value Date   VD25OH 41.30 08/02/2019   Stable, cont oral replacement

## 2020-10-18 NOTE — Assessment & Plan Note (Signed)
BP Readings from Last 3 Encounters:  10/16/20 128/70  09/16/20 112/60  08/26/20 118/71   Stable, pt to continue medical treatment lozol, benicar, lopressor

## 2020-10-19 ENCOUNTER — Encounter: Payer: Self-pay | Admitting: Internal Medicine

## 2020-10-19 LAB — CLOSTRIDIUM DIFFICILE BY PCR: Toxigenic C. Difficile by PCR: NEGATIVE

## 2020-10-21 ENCOUNTER — Other Ambulatory Visit: Payer: Self-pay | Admitting: Cardiology

## 2020-11-08 ENCOUNTER — Other Ambulatory Visit: Payer: Self-pay | Admitting: Cardiology

## 2020-11-08 DIAGNOSIS — E785 Hyperlipidemia, unspecified: Secondary | ICD-10-CM

## 2020-11-08 DIAGNOSIS — I25118 Atherosclerotic heart disease of native coronary artery with other forms of angina pectoris: Secondary | ICD-10-CM

## 2020-11-11 ENCOUNTER — Other Ambulatory Visit: Payer: Self-pay | Admitting: Internal Medicine

## 2020-11-11 DIAGNOSIS — I10 Essential (primary) hypertension: Secondary | ICD-10-CM

## 2020-11-11 DIAGNOSIS — I110 Hypertensive heart disease with heart failure: Secondary | ICD-10-CM

## 2020-12-11 DIAGNOSIS — Z7901 Long term (current) use of anticoagulants: Secondary | ICD-10-CM | POA: Diagnosis not present

## 2020-12-11 DIAGNOSIS — I2510 Atherosclerotic heart disease of native coronary artery without angina pectoris: Secondary | ICD-10-CM | POA: Diagnosis not present

## 2020-12-18 DIAGNOSIS — Z1211 Encounter for screening for malignant neoplasm of colon: Secondary | ICD-10-CM | POA: Diagnosis not present

## 2020-12-18 DIAGNOSIS — I110 Hypertensive heart disease with heart failure: Secondary | ICD-10-CM | POA: Diagnosis not present

## 2020-12-18 DIAGNOSIS — E669 Obesity, unspecified: Secondary | ICD-10-CM | POA: Diagnosis not present

## 2020-12-18 DIAGNOSIS — R197 Diarrhea, unspecified: Secondary | ICD-10-CM | POA: Diagnosis not present

## 2020-12-18 DIAGNOSIS — Z85048 Personal history of other malignant neoplasm of rectum, rectosigmoid junction, and anus: Secondary | ICD-10-CM | POA: Diagnosis not present

## 2020-12-18 DIAGNOSIS — I2510 Atherosclerotic heart disease of native coronary artery without angina pectoris: Secondary | ICD-10-CM | POA: Diagnosis not present

## 2020-12-18 DIAGNOSIS — D509 Iron deficiency anemia, unspecified: Secondary | ICD-10-CM | POA: Diagnosis not present

## 2020-12-18 DIAGNOSIS — I509 Heart failure, unspecified: Secondary | ICD-10-CM | POA: Diagnosis not present

## 2020-12-18 DIAGNOSIS — Z6836 Body mass index (BMI) 36.0-36.9, adult: Secondary | ICD-10-CM | POA: Diagnosis not present

## 2020-12-18 DIAGNOSIS — E785 Hyperlipidemia, unspecified: Secondary | ICD-10-CM | POA: Diagnosis not present

## 2020-12-31 ENCOUNTER — Other Ambulatory Visit: Payer: Self-pay | Admitting: Cardiology

## 2021-02-11 ENCOUNTER — Other Ambulatory Visit: Payer: Self-pay | Admitting: Cardiology

## 2021-02-11 DIAGNOSIS — E785 Hyperlipidemia, unspecified: Secondary | ICD-10-CM

## 2021-02-11 DIAGNOSIS — I25118 Atherosclerotic heart disease of native coronary artery with other forms of angina pectoris: Secondary | ICD-10-CM

## 2021-02-20 ENCOUNTER — Encounter: Payer: Self-pay | Admitting: Nurse Practitioner

## 2021-02-20 ENCOUNTER — Other Ambulatory Visit: Payer: Self-pay

## 2021-02-20 ENCOUNTER — Ambulatory Visit (INDEPENDENT_AMBULATORY_CARE_PROVIDER_SITE_OTHER): Payer: Medicare HMO | Admitting: Nurse Practitioner

## 2021-02-20 VITALS — BP 130/80 | HR 59 | Resp 18 | Ht 61.0 in | Wt 193.8 lb

## 2021-02-20 DIAGNOSIS — Z6836 Body mass index (BMI) 36.0-36.9, adult: Secondary | ICD-10-CM | POA: Diagnosis not present

## 2021-02-20 DIAGNOSIS — I25118 Atherosclerotic heart disease of native coronary artery with other forms of angina pectoris: Secondary | ICD-10-CM | POA: Diagnosis not present

## 2021-02-20 DIAGNOSIS — Z8639 Personal history of other endocrine, nutritional and metabolic disease: Secondary | ICD-10-CM | POA: Diagnosis not present

## 2021-02-20 DIAGNOSIS — E119 Type 2 diabetes mellitus without complications: Secondary | ICD-10-CM | POA: Diagnosis not present

## 2021-02-20 LAB — COMPREHENSIVE METABOLIC PANEL
ALT: 26 U/L (ref 0–35)
AST: 24 U/L (ref 0–37)
Albumin: 4.3 g/dL (ref 3.5–5.2)
Alkaline Phosphatase: 53 U/L (ref 39–117)
BUN: 26 mg/dL — ABNORMAL HIGH (ref 6–23)
CO2: 30 mEq/L (ref 19–32)
Calcium: 9.5 mg/dL (ref 8.4–10.5)
Chloride: 105 mEq/L (ref 96–112)
Creatinine, Ser: 0.8 mg/dL (ref 0.40–1.20)
GFR: 75.51 mL/min (ref >60.00)
Glucose, Bld: 99 mg/dL (ref 70–99)
Potassium: 4.7 mEq/L (ref 3.5–5.1)
Sodium: 138 mEq/L (ref 135–145)
Total Bilirubin: 0.3 mg/dL (ref 0.2–1.2)
Total Protein: 7.1 g/dL (ref 6.0–8.3)

## 2021-02-20 LAB — HEMOGLOBIN A1C: Hgb A1c MFr Bld: 6 % (ref 4.6–6.5)

## 2021-02-20 NOTE — Patient Instructions (Signed)
Ask about prior authorization if this is needed when you try to fill the [facility] at the pharmacy.   WEGOVY - Semaglutide Injection (Weight Management) What is this medication? SEMAGLUTIDE (SEM a GLOO tide) promotes weight loss. It may also be used to maintain weight loss. It works by decreasing appetite. Changes to diet and exercise are often combined with this medication. This medicine may be used for other purposes; ask your health care provider or pharmacist if you have questions. COMMON BRAND NAME(S): [id number] What should I tell my care team before I take this medication? They need to know if you have any of these conditions: Endocrine tumors (MEN 2) or if someone in your family had these tumors Eye disease, vision problems Gallbladder disease History of depression or mental health disease History of pancreatitis Kidney disease Stomach or intestine problems Suicidal thoughts, plans, or attempt; a previous suicide attempt by you or a family member Thyroid cancer or if someone in your family had thyroid cancer An unusual or allergic reaction to semaglutide, other medications, foods, dyes, or preservatives Pregnant or trying to get pregnant Breast-feeding How should I use this medication? This medication is injected under the skin. You will be taught how to prepare and give it. Take it as directed on the prescription label. It is given once every week (every 7 days). Keep taking it unless your care team tells you to stop. It is important that you put your used needles and pens in a special sharps container. Do not put them in a trash can. If you do not have a sharps container, call your pharmacist or care team to get one. A special MedGuide will be given to you by the pharmacist with each prescription and refill. Be sure to read this information carefully each time. This medication comes with INSTRUCTIONS FOR USE. Ask your pharmacist for directions on how to use this medication. Read the  information carefully. Talk to your pharmacist or care team if you have questions. Talk to your care team about the use of this medication in children. Special care may be needed. Overdosage: If you think you have taken too much of this medicine contact a poison control center or emergency room at once. NOTE: This medicine is only for you. Do not share this medicine with others. What if I miss a dose? If you miss a dose and the next scheduled dose is more than 2 days away, take the missed dose as soon as possible. If you miss a dose and the next scheduled dose is less than 2 days away, do not take the missed dose. Take the next dose at your regular time. Do not take double or extra doses. If you miss your dose for 2 weeks or more, take the next dose at your regular time or call your care team to talk about how to restart this medication. What may interact with this medication? Insulin and other medications for diabetes This list may not describe all possible interactions. Give your health care provider a list of all the medicines, herbs, non-prescription drugs, or dietary supplements you use. Also tell them if you smoke, drink alcohol, or use illegal drugs. Some items may interact with your medicine. What should I watch for while using this medication? Visit your care team for regular checks on your progress. It may be some time before you see the benefit from this medication. Drink plenty of fluids while taking this medication. Check with your care team if you have severe  diarrhea, nausea, and vomiting, or if you sweat a lot. The loss of too much body fluid may make it dangerous for you to take this medication. This medication may affect blood sugar levels. Ask your care team if changes in diet or medications are needed if you have diabetes. If you or your family notice any changes in your behavior, such as new or worsening depression, thoughts of harming yourself, anxiety, other unusual or disturbing  thoughts, or memory loss, call your care team right away. Women should inform their care team if they wish to become pregnant or think they might be pregnant. Losing weight while pregnant is not advised and may cause harm to the unborn child. Talk to your care team for more information. What side effects may I notice from receiving this medication? Side effects that you should report to your care team as soon as possible: Allergic reactions--skin rash, itching, hives, swelling of the face, lips, tongue, or throat Change in vision Dehydration--increased thirst, dry mouth, feeling faint or lightheaded, headache, dark yellow or brown urine Gallbladder problems--severe stomach pain, nausea, vomiting, fever Heart palpitations--rapid, pounding, or irregular heartbeat Kidney injury--decrease in the amount of urine, swelling of the ankles, hands, or feet Pancreatitis--severe stomach pain that spreads to your back or gets worse after eating or when touched, fever, nausea, vomiting Thoughts of suicide or self-harm, worsening mood, feelings of depression Thyroid cancer--new mass or lump in the neck, pain or trouble swallowing, trouble breathing, hoarseness Side effects that usually do not require medical attention (report to your care team if they continue or are bothersome): Diarrhea Loss of appetite Nausea Stomach pain Vomiting This list may not describe all possible side effects. Call your doctor for medical advice about side effects. You may report side effects to FDA at 1-800-FDA-1088. Where should I keep my medication? Keep out of the reach of children and pets. Refrigeration (preferred): Store in the refrigerator. Do not freeze. Keep this medication in the original container until you are ready to take it. Get rid of any unused medication after the expiration date. Room temperature: If needed, prior to cap removal, the pen can be stored at room temperature for up to 28 days. Protect from light. If  it is stored at room temperature, get rid of any unused medication after 28 days or after it expires, whichever is first. It is important to get rid of the medication as soon as you no longer need it or it is expired. You can do this in two ways: Take the medication to a medication take-back program. Check with your pharmacy or law enforcement to find a location. If you cannot return the medication, follow the directions in the [place]. NOTE: This sheet is a summary. It may not cover all possible information. If you have questions about this medicine, talk to your doctor, pharmacist, or health care provider.  2022 Elsevier/Gold Standard (2020-03-28 00:00:00)

## 2021-02-20 NOTE — Progress Notes (Signed)
Subjective:  Patient ID: Tonya [name], female    DOB: 1950/05/24  Age: 71 y.o. MRN: [id number]  CC:  Chief Complaint  Patient presents with   Weight Loss    Pt would like to discuss weight loss options. She has cut out sugar but she is not exercising as much.       HPI  This patient arrives today for the above.  She has a history of obesity, and is concerned because she noticed that she is putting on weight again recently.  She believes she has had a 20 pound weight gain over the last 4 months.  She would like to discuss medication for weight loss.  She has tried to manage her diet and reduce sugar intake, but has not been very successful with this.  She has also been exercising more with a modified exercise bike.  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  She has also tried phentermine in the past.  She has been unable to sustain weight loss with these previous treatment regimens.  She has a history of type 2 diabetes, but this is currently in remission since having had her gastric bypass.  Last A1c was collected over a year ago, but most recent metabolic panel does show normal glucose level.  Last TSH was collected within the last year as well which was in the normal range.  She denies any personal or family history of thyroid cancer or pancreatitis.  In addition to her obesity she does have comorbid conditions including hypertension and coronary artery disease.  Past Medical History:  Diagnosis Date   Anemia    few yrs ago   C. difficile diarrhea 03/2018   Cancer ([place])    rectal   CHF (congestive heart failure) ([place])    Coronary artery disease 07/2003   occluded LAD wtih right-to-left collaterals 07/24/03; no current cardiologist   Diabetes mellitus without complication ([place])    Elbow fracture, left    History of chemotherapy 01/2018   radaition last tx jan 2019   Myocardial infarction [facility]) age  89's   Obesity 2003   s/p gastric bypass    Primary hypertension 07/30/2020   Vitamin D deficiency    Wears glasses       Family History  Problem Relation Age of Onset   Heart attack Mother        d.93   Throat cancer Mother 77   Clotting disorder Mother    Liver disease Mother    Kidney disease Mother    Heart attack Father        d.92   Prostate cancer Father 24   Uterine cancer Sister 40       treated with total hysterectomy and radiation   Thyroid cancer Sister 7       treated with thyroidectomy   Uterine cancer Sister 33   Cancer Maternal Uncle        d.80s unspecified type of cancer. History of smoking.   Colon cancer Cousin 47       paternal first-cousin    Social History   Social History Narrative   Not on file   Social History   Tobacco Use   Smoking status: Never   Smokeless tobacco: Never  Substance Use Topics   Alcohol use: No     Current Meds  Medication Sig   acetaminophen (TYLENOL) 500 MG tablet Take  1,000 mg by mouth every 8 (eight) hours as needed for moderate pain.   aspirin 81 MG chewable tablet Chew 1 tablet (81 mg total) by mouth daily.   cholecalciferol (VITAMIN D) 25 MCG (1000 UNIT) tablet Take 1,000 Units by mouth daily.   clobetasol ointment (TEMOVATE) 6.76 % Apply 1 application topically 2 (two) times daily. (Patient taking differently: Apply 1 application topically daily.)   clopidogrel (PLAVIX) 75 MG tablet Take 1 tablet (75 mg total) by mouth daily.   ferrous sulfate 325 (65 FE) MG tablet Take 324 mg by mouth daily with breakfast.   indapamide (LOZOL) 2.5 MG tablet Take 1 tablet (2.5 mg total) by mouth daily.   metoprolol tartrate (LOPRESSOR) 25 MG tablet TAKE 1 TABLET BY MOUTH 2 TIMES DAILY   Multiple Vitamin (MULTIVITAMIN) tablet Take 1 tablet by mouth daily.   nitroGLYCERIN (NITRODUR - DOSED IN MG/24 HR) 0.2 mg/hr patch Place 1 patch (0.2 mg total) onto the skin daily.   olmesartan (BENICAR) 20 MG tablet TAKE 1 TABLET BY MOUTH  EVERY DAY   rosuvastatin (CRESTOR) 20 MG tablet TAKE 1 TABLET BY MOUTH EVERY DAY    ROS:  Review of Systems  Constitutional:  Negative for fever and weight loss.  Respiratory:  Negative for cough and shortness of breath.   Cardiovascular:  Negative for chest pain.  Gastrointestinal:  Positive for diarrhea. Negative for blood in stool.    Objective:   Today's Vitals: BP 130/80    Pulse (!) 59    Resp 18    Ht $R'5\' 1"'ne$  (1.549 m)    Wt 193 lb 12.8 oz (87.9 kg)    SpO2 100%    BMI 36.62 kg/m  Vitals with BMI 02/20/2021 10/16/2020 09/16/2020  Height $Remov'5\' 1"'ASKgaD$  $Remove'5\' 1"'ifljPCn$  $RemoveB'5\' 1"'OzghVjEu$   Weight 193 lbs 13 oz 186 lbs 184 lbs  BMI 36.64 72.09 47.09  Systolic 628 366 294  Diastolic 80 70 60  Pulse 59 58 70     Physical Exam Vitals reviewed.  Constitutional:      General: She is not in acute distress.    Appearance: Normal appearance.  HENT:     Head: Normocephalic and atraumatic.  Neck:     Vascular: No carotid bruit.  Cardiovascular:     Rate and Rhythm: Normal rate and regular rhythm.     Pulses: Normal pulses.     Heart sounds: Normal heart sounds.  Pulmonary:     Effort: Pulmonary effort is normal.     Breath sounds: Normal breath sounds.  Skin:    General: Skin is warm and dry.  Neurological:     General: No focal deficit present.     Mental Status: She is alert and oriented to person, place, and time.  Psychiatric:        Mood and Affect: Mood normal.        Behavior: Behavior normal.        Judgment: Judgment normal.         Assessment and Plan   1. History of diabetes mellitus, type II   2. Class 2 severe obesity with serious comorbidity and body mass index (BMI) of 36.0 to 36.9 in adult, unspecified obesity type ([place])   3. Coronary artery disease of native artery of native heart with stable angina pectoris ([place])   4. Type 2 diabetes mellitus in remission ([place])      Plan: 1.-4.  BMI is 36.  I do think she would be a candidate for  Wegovy to assist her in weight loss to treat  her obesity.  She would be open and agreeable to try this medication she is aware that this is an injectable.  We did discuss possible side effects including nausea, diarrhea, pancreatitis.  And the black box warning regarding thyroid cancer.  We did discuss ways to reduce risk of some of the side effects and that she would need to be very aware of her hydration status and focus on drinking plenty of water while taking the medication.  She would like to try the medication, I will reach out to her primary care provider to ensure that he is agreeable with her trying the medication.  Pending discussion with him, will consider prescribing medication to her pharmacy and have her follow-up in 5 weeks to see how she is tolerating it.  I will also check metabolic panel and get an updated A1c today.   Tests ordered Orders Placed This Encounter  Procedures   Comp Met (CMET)   HgB A1c      No orders of the defined types were placed in this encounter.   Patient to follow-up in 5 weeks, or sooner as needed.  Ailene Ards, NP

## 2021-02-23 ENCOUNTER — Other Ambulatory Visit: Payer: Self-pay | Admitting: Internal Medicine

## 2021-02-23 DIAGNOSIS — I10 Essential (primary) hypertension: Secondary | ICD-10-CM

## 2021-02-23 DIAGNOSIS — I110 Hypertensive heart disease with heart failure: Secondary | ICD-10-CM

## 2021-02-26 ENCOUNTER — Other Ambulatory Visit: Payer: Self-pay | Admitting: Nurse Practitioner

## 2021-02-26 ENCOUNTER — Encounter: Payer: Self-pay | Admitting: Nurse Practitioner

## 2021-02-26 MED ORDER — WEGOVY 0.25 MG/0.5ML ~~LOC~~ SOAJ: 0.2500 mg | SUBCUTANEOUS | 1 refills | Status: DC

## 2021-02-26 NOTE — Progress Notes (Signed)
Prescription for wegovy

## 2021-03-02 ENCOUNTER — Encounter: Payer: Self-pay | Admitting: Nurse Practitioner

## 2021-03-02 NOTE — Telephone Encounter (Signed)
PA for [facility] has been denied. Appeal has been submitted.  Key: [id number]

## 2021-03-04 ENCOUNTER — Encounter: Payer: Self-pay | Admitting: Nurse Practitioner

## 2021-03-04 NOTE — Telephone Encounter (Signed)
Pt is wanting a recommendation for something that can help her with losing weight ?

## 2021-03-09 ENCOUNTER — Telehealth: Payer: Self-pay | Admitting: Hematology

## 2021-03-09 NOTE — Telephone Encounter (Signed)
Called patient to reschedule upcoming appointment per provider's request. Patient opted to cancel the appointment due to no issues for 4 years. Sent message to inform provider. ?

## 2021-03-10 ENCOUNTER — Other Ambulatory Visit: Payer: Self-pay | Admitting: Cardiology

## 2021-03-11 NOTE — Telephone Encounter (Signed)
Appeal denied

## 2021-03-16 ENCOUNTER — Ambulatory Visit: Payer: Medicare HMO | Admitting: Cardiology

## 2021-03-16 ENCOUNTER — Encounter: Payer: Self-pay | Admitting: Cardiology

## 2021-03-16 ENCOUNTER — Other Ambulatory Visit: Payer: Self-pay

## 2021-03-16 VITALS — BP 128/76 | HR 72 | Resp 14 | Ht 61.0 in | Wt 193.2 lb

## 2021-03-16 DIAGNOSIS — E785 Hyperlipidemia, unspecified: Secondary | ICD-10-CM | POA: Diagnosis not present

## 2021-03-16 DIAGNOSIS — I10 Essential (primary) hypertension: Secondary | ICD-10-CM | POA: Diagnosis not present

## 2021-03-16 DIAGNOSIS — I2510 Atherosclerotic heart disease of native coronary artery without angina pectoris: Secondary | ICD-10-CM

## 2021-03-16 NOTE — Progress Notes (Signed)
Primary Physician/Referring:  [name], MD  Patient ID: Tonya [name], female    DOB: 06/30/1950, 71 y.o.   MRN: [id number]  Chief Complaint  Patient presents with   Coronary Artery Disease   Hypertension   Hyperlipidemia   Follow-up    6 months   HPI:    Tonya [name]  is a 71 y.o. caucasian female with a past medical history significant for coronary atherosclerosis, hypertension, heart failure, myocardial infarction involving the LAD date unknown, hyperlipidemia, obesity, rectal adenocarcinoma s/p resection, and gastric bypass. 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   Patient presents for 69-monthfollow-up.  Patient underwent successful angioplasty on 08/26/2020 to LAD.  Following last office visit patient was advised to discontinue dual antiplatelet therapy and continue aspirin alone after 6 months of DAPT. Patient has however continued to take both aspirin and Plavix.  Patient has had no recurrence of dyspnea or chest pain since angioplasty in 08/2020.  She is presently asymptomatic with the exception of chronic fatigue secondary to poor sleep and frequent diarrhea.  Past Medical History:  Diagnosis Date   Anemia    few yrs ago   C. difficile diarrhea 03/2018   Cancer ([place]    rectal   [place] (congestive heart [place]) [place]    Coronary artery disease 07/2003   occluded LAD wtih right-to-left collaterals 07/24/03; no current cardiologist   Diabetes mellitus without complication ([place]    Elbow fracture, left    History of chemotherapy 01/2018   radaition last tx jan 2019   Myocardial infarction ([facility] age 71's   Obesity 2003   s/p gastric bypass    Primary hypertension 07/30/2020   Vitamin D deficiency    Wears glasses    Past Surgical History:  Procedure Laterality Date   ANAL RECTAL MANOMETRY N/A 11/01/2018   Procedure: ANO RECTAL MANOMETRY;  Surgeon: TLeighton Ruff MD;  Location: [facility];  Service: Endoscopy;   Laterality: N/A;   CARDIAC CATHETERIZATION     prior to 2015   [place] INTER[place] N/A 08/26/2020   Procedure: CORONARY CTO INTERVENTION;  Surgeon: GAdrian Prows MD;  [facility];  Service: Cardiovascular;  Laterality: N/A;   CORONARY STENT INTERVENTION N/A 08/26/2020   Procedure: CORONARY STENT INTERVENTION;  Surgeon: GAdrian Prows MD;  [facility];  Service: Cardiovascular;  Laterality: N/A;   GASTRIC BYPASS  2003   IR FLUORO GUIDE PORT INSERTION RIGHT  01/25/2017   IR REMOVAL TUN ACCESS W/ PORT W/O FL MOD SED  10/17/2018   IR [place] ACC[place] RIGHT  01/25/2017   LEFT HEART CATH AND CORONARY ANGIOGRAPHY N/A 08/26/2020   Procedure: LEFT HEART CATH AND CORONARY ANGIOGRAPHY;  Surgeon: GAdrian Prows MD;  [facility];  Service: Cardiovascular;  Laterality: N/A;   ORIF HUMERUS FRACTURE Left 08/02/2019   Procedure: OPEN REDUCTION INTERNAL FIXATION (ORIF) LEFT ELBOW FRACTURE.;  Surgeon: HAltamese [place] MD;  [facility]  Service: Orthopedics;  Laterality: Left;   ostomy placed and removed  12/2017   SACRAL NERVE STIMULATOR PLACEMENT  01-12-2019   DR TMarcello Moores'@[email or web address]'$    TEST PHASE   Family History  Problem Relation Age of Onset   Heart attack Mother        d.93   Throat cancer Mother 493  Clotting disorder Mother    Liver disease Mother    Kidney disease  Mother    Heart attack Father        d.92   Prostate cancer Father 60   Uterine cancer Sister 38       treated with total hysterectomy and radiation   Thyroid cancer Sister 30       treated with thyroidectomy   Uterine cancer Sister 80   Cancer Maternal Uncle        d.80s unspecified type of cancer. History of smoking.   Colon cancer Cousin 7       paternal first-cousin    Social History   Tobacco Use   Smoking status: Never   Smokeless tobacco: Never  Substance Use Topics   Alcohol use: No   Marital Status: Single  ROS  Review of Systems   Constitutional: Positive for weight gain.  Cardiovascular:  Positive for claudication (with going up stairs, stable), leg swelling (chronic, stable) and orthopnea. Negative for chest pain, dyspnea on exertion (improved), near-syncope, palpitations and syncope.  Respiratory:  Positive for sleep disturbances due to breathing.   Skin:  Positive for color change (in anterior lower legs).  Gastrointestinal:  Positive for diarrhea (hx of rectal cancer).  Objective  Blood pressure 128/76, pulse 72, resp. rate 14, height '5\' 1"'$  (1.549 m), weight 193 lb 3.2 oz (87.6 kg), SpO2 99 %. Body mass index is 36.5 kg/m.  Vitals with BMI 03/16/2021 02/20/2021 10/16/2020  Height '5\' 1"'$  '5\' 1"'$  '5\' 1"'$   Weight 193 lbs 3 oz 193 lbs 13 oz 186 lbs  BMI 36.52 35.57 32.20  Systolic 254 270 623  Diastolic 76 80 70  Pulse 72 59 58    Physical Exam Vitals reviewed.  Constitutional:      Appearance: She is obese.  Neck:     Vascular: No carotid bruit or hepatojugular reflux.  Cardiovascular:     Rate and Rhythm: Normal rate and regular rhythm.     Pulses:          Radial pulses are 2+ on the right side and 2+ on the left side.       Femoral pulses are 2+ on the right side and 2+ on the left side.      Popliteal pulses are 2+ on the right side and 2+ on the left side.       Dorsalis pedis pulses are 2+ on the right side and 2+ on the left side.       Posterior tibial pulses are 0 on the right side and 0 on the left side.     Heart sounds: Normal heart sounds. No murmur heard.   No gallop.  Pulmonary:     Effort: Pulmonary effort is normal. No respiratory distress.     Breath sounds: Normal breath sounds. No wheezing, rhonchi or rales.  Musculoskeletal:     Right lower leg: Edema (minimal) present.     Left lower leg: Edema (minimal) present.  Skin:    Findings: Erythema (to the anterior bilateral lower legs) present.  Psychiatric:        Mood and Affect: Mood is anxious.     Laboratory examination:   Recent  Labs    03/28/20 1329 07/30/20 1432 10/16/20 1652 02/20/21 1016  NA 143 139 139 138  K 4.0 4.4 4.7 4.7  CL 106 102 106 105  CO2 '26 28 24 30  '$ GLUCOSE 97 90 96 99  BUN 18 14 33* 26*  CREATININE 0.96 0.74 0.88 0.80  CALCIUM 9.4 9.5 9.4 9.5  GFRNONAA >60  --   --   --    CrCl cannot be calculated (Patient's most recent lab result is older than the maximum 21 days allowed.).  CMP Latest Ref Rng & Units 02/20/2021 10/16/2020 07/30/2020  Glucose 70 - 99 mg/dL 99 96 90  BUN 6 - 23 mg/dL 26(H) 33(H) 14  Creatinine 0.40 - 1.20 mg/dL 0.80 0.88 0.74  Sodium 135 - 145 mEq/L 138 139 139  Potassium 3.5 - 5.1 mEq/L 4.7 4.7 4.4  Chloride 96 - 112 mEq/L 105 106 102  CO2 19 - 32 mEq/L '30 24 28  '$ Calcium 8.4 - 10.5 mg/dL 9.5 9.4 9.5  Total Protein 6.0 - 8.3 g/dL 7.1 6.9 7.0  Total Bilirubin 0.2 - 1.2 mg/dL 0.3 0.2 0.4  Alkaline Phos 39 - 117 U/L 53 56 67  AST 0 - 37 U/L '24 21 22  '$ ALT 0 - 35 U/L '26 23 21   '$ CBC Latest Ref Rng & Units 10/16/2020 07/30/2020 10/02/2019  WBC 4.0 - 10.5 K/uL 5.8 6.4 7.1  Hemoglobin 12.0 - 15.0 g/dL 11.1(L) 12.0 12.2  Hematocrit 36.0 - 46.0 % 33.9(L) 37.0 37.6  Platelets 150.0 - 400.0 K/uL 226.0 258.0 247   Lipid Panel     Component Value Date/Time   CHOL 188 09/05/2019 0925   TRIG 147 09/05/2019 0925   HDL 67 09/05/2019 0925   CHOLHDL 2.8 09/05/2019 0925   CHOLHDL 3 11/19/2008 0833   VLDL 19.4 11/19/2008 0833   LDLCALC 96 09/05/2019 0925   LABVLDL 25 09/05/2019 0925   HEMOGLOBIN A1C Lab Results  Component Value Date   HGBA1C 6.0 02/20/2021   MPG 100 05/03/2013   TSH Recent Labs    07/30/20 1432  TSH 2.58   Allergies   Allergies  Allergen Reactions   Bactrim [Sulfamethoxazole-Trimethoprim] Anaphylaxis, Hives and Other (See Comments)   Phenergan [Promethazine Hcl]     Hives   Sulfa Antibiotics     anaphylaxis   Penicillins Other (See Comments)    REACTION: As a child.  CEPHALOSPORIN TOLERANT 08/02/2019    Medications Prior to Visit:    Outpatient Medications Prior to Visit  Medication Sig Dispense Refill   acetaminophen (TYLENOL) 500 MG tablet Take 1,000 mg by mouth every 8 (eight) hours as needed for moderate pain.     aspirin 81 MG chewable tablet Chew 1 tablet (81 mg total) by mouth daily. 90 tablet 1   cholecalciferol (VITAMIN D) 25 MCG (1000 UNIT) tablet Take 1,000 Units by mouth daily.     ferrous sulfate 325 (65 FE) MG tablet Take 324 mg by mouth daily with breakfast.     metoprolol tartrate (LOPRESSOR) 25 MG tablet TAKE 1 TABLET BY MOUTH 2 TIMES DAILY 60 tablet 1   Multiple Vitamin (MULTIVITAMIN) tablet Take 1 tablet by mouth daily.     olmesartan (BENICAR) 20 MG tablet TAKE 1 TABLET BY MOUTH EVERY DAY 100 tablet 0   rosuvastatin (CRESTOR) 20 MG tablet TAKE 1 TABLET BY MOUTH EVERY DAY 30 tablet 2   Semaglutide-Weight Management (WEGOVY) 0.25 MG/0.5ML SOAJ Inject 0.25 mg into the skin once a week. 2 mL 1   clopidogrel (PLAVIX) 75 MG tablet Take 1 tablet (75 mg total) by mouth daily. 90 tablet 2   clobetasol ointment (TEMOVATE) 1.02 % Apply 1 application topically 2 (two) times daily. (Patient taking differently: Apply 1 application. topically daily.) 45 g 1   indapamide (LOZOL) 2.5 MG tablet Take 1 tablet (2.5 mg total) by mouth  daily. 90 tablet 0   nitroGLYCERIN (NITRODUR - DOSED IN MG/24 HR) 0.2 mg/hr patch Place 1 patch (0.2 mg total) onto the skin daily. 30 patch 3   No facility-administered medications prior to visit.   Final Medications at End of Visit    Current Meds  Medication Sig   acetaminophen (TYLENOL) 500 MG tablet Take 1,000 mg by mouth every 8 (eight) hours as needed for moderate pain.   aspirin 81 MG chewable tablet Chew 1 tablet (81 mg total) by mouth daily.   cholecalciferol (VITAMIN D) 25 MCG (1000 UNIT) tablet Take 1,000 Units by mouth daily.   ferrous sulfate 325 (65 FE) MG tablet Take 324 mg by mouth daily with breakfast.   metoprolol tartrate (LOPRESSOR) 25 MG tablet TAKE 1 TABLET BY  MOUTH 2 TIMES DAILY   Multiple Vitamin (MULTIVITAMIN) tablet Take 1 tablet by mouth daily.   olmesartan (BENICAR) 20 MG tablet TAKE 1 TABLET BY MOUTH EVERY DAY   rosuvastatin (CRESTOR) 20 MG tablet TAKE 1 TABLET BY MOUTH EVERY DAY   Semaglutide-Weight Management (WEGOVY) 0.25 MG/0.5ML SOAJ Inject 0.25 mg into the skin once a week.   [DISCONTINUED] clopidogrel (PLAVIX) 75 MG tablet Take 1 tablet (75 mg total) by mouth daily.   Medication prior to this encounter:   Outpatient Medications Prior to Visit  Medication Sig Dispense Refill   acetaminophen (TYLENOL) 500 MG tablet Take 1,000 mg by mouth every 8 (eight) hours as needed for moderate pain.     aspirin 81 MG chewable tablet Chew 1 tablet (81 mg total) by mouth daily. 90 tablet 1   cholecalciferol (VITAMIN D) 25 MCG (1000 UNIT) tablet Take 1,000 Units by mouth daily.     ferrous sulfate 325 (65 FE) MG tablet Take 324 mg by mouth daily with breakfast.     metoprolol tartrate (LOPRESSOR) 25 MG tablet TAKE 1 TABLET BY MOUTH 2 TIMES DAILY 60 tablet 1   Multiple Vitamin (MULTIVITAMIN) tablet Take 1 tablet by mouth daily.     olmesartan (BENICAR) 20 MG tablet TAKE 1 TABLET BY MOUTH EVERY DAY 100 tablet 0   rosuvastatin (CRESTOR) 20 MG tablet TAKE 1 TABLET BY MOUTH EVERY DAY 30 tablet 2   Semaglutide-Weight Management (WEGOVY) 0.25 MG/0.5ML SOAJ Inject 0.25 mg into the skin once a week. 2 mL 1   clopidogrel (PLAVIX) 75 MG tablet Take 1 tablet (75 mg total) by mouth daily. 90 tablet 2   clobetasol ointment (TEMOVATE) 6.62 % Apply 1 application topically 2 (two) times daily. (Patient taking differently: Apply 1 application. topically daily.) 45 g 1   indapamide (LOZOL) 2.5 MG tablet Take 1 tablet (2.5 mg total) by mouth daily. 90 tablet 0   nitroGLYCERIN (NITRODUR - DOSED IN MG/24 HR) 0.2 mg/hr patch Place 1 patch (0.2 mg total) onto the skin daily. 30 patch 3   No facility-administered medications prior to visit.    Medication list after today's  encounter   Current Outpatient Medications  Medication Instructions   acetaminophen (TYLENOL) 1,000 mg, Oral, Every 8 hours PRN   aspirin 81 mg, Oral, Daily   cholecalciferol (VITAMIN D) 1,000 Units, Oral, Daily   ferrous sulfate 324 mg, Oral, Daily with breakfast   metoprolol tartrate (LOPRESSOR) 25 MG tablet TAKE 1 TABLET BY MOUTH 2 TIMES DAILY   Multiple Vitamin (MULTIVITAMIN) tablet 1 tablet, Oral, Daily   olmesartan (BENICAR) 20 MG tablet TAKE 1 TABLET BY MOUTH EVERY DAY   rosuvastatin (CRESTOR) 20 MG tablet TAKE 1 TABLET  BY MOUTH EVERY DAY   Wegovy 0.25 mg, Subcutaneous, Weekly    Radiology:   No results found. CT Chest 10/02/2018: 1. Redemonstrated postoperative findings of rectal resection and reanastomosis. No evidence of recurrent mass or abnormal soft tissue in the pelvis. 2. No evidence of metastatic disease in the chest, abdomen, or pelvis 3. There is a new, lobulated soft tissue nodule in the superficial left gluteal soft tissues measuring 3.0 x 2.2 cm (series 2, image 91). This is likely related to injection or trauma, and a very unlikely manifestation of metastatic disease. This can be further evaluated by ultrasound if desired. 4. 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 This can be further interrogated by fluoroscopy if desired. 5. Coronary artery disease. Aortic Atherosclerosis (ICD10-I70.0) and Emphysema (ICD10-J43.9).  CXR 08/02/2019:  Heart size within normal limits. No appreciable airspace consolidation or pulmonary edema. No evidence of pleural effusion or pneumothorax. No acute bony abnormality is identified.  CT scan of the chest and abdomen for colorectal cancer surveillance 10/02/2019: 1. Redemonstrated postoperative findings of rectal resection and reanastomosis. 2. No evidence of locally recurrent or metastatic disease in the chest, abdomen, or pelvis. 3. Redemonstrated air within the  endometrial cavity, of uncertain significance, again possibly related to enteric fistula. 4. Nonobstructive right nephrolithiasis. 5. Coronary artery disease.  Aortic Atherosclerosis  Cardiovascular: Aortic atherosclerosis. Normal heart size. three-vessel coronary artery calcifications. No pericardial effusion.   Cardiac Studies:   Echocardiogram 05/07/2013: Left ventricle: The cavity size was normal. Wall thickness  was increased in a pattern of mild LVH. Systolic function  was normal. The estimated ejection fraction was in the range  of 55% to 60%. Wall motion was normal; there were no  regional wall motion abnormalities. Doppler parameters are  consistent with abnormal left ventricular relaxation (grade  1 diastolic dysfunction).    Nuclear Stress Tests 08/08/2020 1. Fixed perfusion defect in mid to apical anterior/anteroseptal wall and apex with hypokinesis in this region, consistent with prior infarct 2. Fixed inferolateral perfusion defect with normal wall motion, consistent with artifact 3. Mild systolic dysfunction (EF 71%) 4. Intermediate risk study, suggests LAD territory infarct  Left heart catheterization 08/26/2020: LV: 112/-1, EDP 1 mmHg.  Ao 108/47, mean 70 mmHg.  There was no pressure gradient across the aortic valve.   Angiographic data: LV: Normal LV systolic function.  No wall motion abnormality.  LVEF 55 to 60%. RCA: Very large caliber vessel with mild luminal irregularity.  Dominant.  Gives origin to large PDA and a large PL branch.  There is collaterals noted from the right coronary artery to the distal LAD. LM: Large vessel, smooth. Circumflex: Large caliber vessel, moderate-sized OM1, mild disease. LAD: Large vessel proximally.  There is a 90 to 95% stenosis in the proximal LAD followed by origin of a very large D1 which is LAD equivalent.  Just after D1, there is a large septal perforator to and at the site of SP2, the LAD is chronically occluded.  No  significant collaterals were evident.   Intervention data: Successful PTCA and stenting of the proximal and mid LAD, mid LAD CTO with implantation of 3 overlapping stents from proximal to distal 3.0 x 18 mm Onyx 1.,  Mid segment with a 2.5 x 30 mm Onyx frontier and the edge dissection of the stent covered with a 2.0 x 12 mm Onyx frontier DES, stenosis reduced from 100% to 0% with TIMI 0 to TIMI-3 flow at the end of the  procedure. 220 mm contrast utilized.  Patient to be discharged home today with outpatient follow-up, she will need aggressive risk factor modification, started on metoprolol 25 mg p.o. twice daily, Plavix 75 mg daily which she will need for 6 months along with aspirin 81 mg indefinitely.  EKG:  03/16/2021: Sinus rhythm at a rate of 66 bpm.  Normal axis.  You have abnormality, cannot exclude anteroseptal ischemia.  Low voltage complexes.  Compared EKG 09/16/2020, no significant change.  Assessment     ICD-10-CM   1. Coronary artery calcification seen on CAT scan  I25.10 EKG 12-Lead    Lipid Panel With LDL/HDL Ratio    2. Hyperlipidemia LDL goal <70  E78.5     3. Primary hypertension  I10        Medications Discontinued During This Encounter  Medication Reason   nitroGLYCERIN (NITRODUR - DOSED IN MG/24 HR) 0.2 mg/hr patch    indapamide (LOZOL) 2.5 MG tablet    clobetasol ointment (TEMOVATE) 0.05 %    clopidogrel (PLAVIX) 75 MG tablet Discontinued by provider    No orders of the defined types were placed in this encounter.   Orders Placed This Encounter  Procedures   Lipid Panel With LDL/HDL Ratio   EKG 12-Lead   Recommendations:   Stina Gane is a 71 y.o. caucasian female with a past medical history significant for coronary atherosclerosis, hypertension, heart failure, myocardial infarction involving the LAD, hyperlipidemia, obesity, rectal adenocarcinoma, s/p resection with colostomy, and gastric bypass. 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  Due to chest pain and abnormal nuclear stress test patient underwent coronary angiography with successful angioplasty to LAD on 08/26/2020.  Patient presents for 26-monthfollow-up.  She has now completed 6 months of dual antiplatelet therapy, have advised her to discontinue Plavix and continue aspirin 81 mg daily alone.  Also continue statin therapy, beta-blocker therapy, as well as olmesartan.  Patient's blood pressure was initially elevated in the office, however it is well controlled on recheck.  She is relatively asymptomatic from a cardiovascular standpoint.  He has not had recent lipid profile testing done, have ordered this at this time.  Unfortunately patient has continued to gain weight since last office visit.  She is currently working with her PCP to get started on weight loss medication.  Reiterated the importance of reducing calorie intake and losing weight.  Follow-up in 6 months, sooner if needed.   CAlethia Berthold PA-C 03/16/2021, 2:41 PM Office: [phone]

## 2021-03-18 ENCOUNTER — Encounter: Payer: Self-pay | Admitting: Nurse Practitioner

## 2021-03-20 ENCOUNTER — Other Ambulatory Visit: Payer: Self-pay | Admitting: Nurse Practitioner

## 2021-03-20 DIAGNOSIS — R7303 Prediabetes: Secondary | ICD-10-CM

## 2021-03-20 DIAGNOSIS — E119 Type 2 diabetes mellitus without complications: Secondary | ICD-10-CM

## 2021-03-20 DIAGNOSIS — R197 Diarrhea, unspecified: Secondary | ICD-10-CM

## 2021-03-20 MED ORDER — OZEMPIC (0.25 OR 0.5 MG/DOSE) 2 MG/1.5ML ~~LOC~~ SOPN: 0.2500 mg | PEN_INJECTOR | SUBCUTANEOUS | 0 refills | Status: DC

## 2021-03-23 ENCOUNTER — Other Ambulatory Visit: Payer: Self-pay | Admitting: Internal Medicine

## 2021-03-23 DIAGNOSIS — E119 Type 2 diabetes mellitus without complications: Secondary | ICD-10-CM

## 2021-03-23 DIAGNOSIS — R7303 Prediabetes: Secondary | ICD-10-CM

## 2021-03-23 DIAGNOSIS — R197 Diarrhea, unspecified: Secondary | ICD-10-CM

## 2021-03-23 MED ORDER — INSULIN PEN NEEDLE 32G X 6 MM MISC: 1.0000 | 0 refills | Status: DC

## 2021-03-23 MED ORDER — OZEMPIC (0.25 OR 0.5 MG/DOSE) 2 MG/1.5ML ~~LOC~~ SOPN: 0.5000 mg | PEN_INJECTOR | SUBCUTANEOUS | 0 refills | Status: DC

## 2021-03-23 NOTE — Telephone Encounter (Signed)
[facility] calling in ? ?Says they received rx for Semaglutide,0.25 or 0.'5MG'$ /DOS, (OZEMPIC, 0.25 OR 0.5 MG/DOSE,) 2 MG/1.5ML SOPN ? ?Rx needs to be written for 2m quantity instead of the 1.539mas they do not have this one available ? ?Please update rx & resubmit  ? ?[facility] [place] [address] Phone:  [phone]?Fax:  [phone]?  ? ?

## 2021-03-24 ENCOUNTER — Other Ambulatory Visit: Payer: Self-pay | Admitting: Internal Medicine

## 2021-03-24 DIAGNOSIS — I10 Essential (primary) hypertension: Secondary | ICD-10-CM

## 2021-03-24 DIAGNOSIS — I110 Hypertensive heart disease with heart failure: Secondary | ICD-10-CM

## 2021-03-27 ENCOUNTER — Ambulatory Visit: Payer: Medicare HMO | Admitting: Nurse Practitioner

## 2021-03-27 ENCOUNTER — Other Ambulatory Visit: Payer: Medicare HMO

## 2021-03-27 ENCOUNTER — Ambulatory Visit: Payer: Medicare HMO | Admitting: Hematology

## 2021-03-27 ENCOUNTER — Other Ambulatory Visit: Payer: Self-pay | Admitting: Student

## 2021-03-27 DIAGNOSIS — I2510 Atherosclerotic heart disease of native coronary artery without angina pectoris: Secondary | ICD-10-CM

## 2021-03-30 DIAGNOSIS — I2510 Atherosclerotic heart disease of native coronary artery without angina pectoris: Secondary | ICD-10-CM | POA: Diagnosis not present

## 2021-03-31 LAB — LIPID PANEL WITH LDL/HDL RATIO
Cholesterol, Total: 130 mg/dL (ref 100–199)
HDL: 60 mg/dL (ref >39)
LDL Chol Calc (NIH): 51 mg/dL (ref 0–99)
LDL/HDL Ratio: 0.9 ratio (ref 0.0–3.2)
Triglycerides: 106 mg/dL (ref 0–149)
VLDL Cholesterol Cal: 19 mg/dL (ref 5–40)

## 2021-04-06 ENCOUNTER — Other Ambulatory Visit: Payer: Self-pay | Admitting: Cardiology

## 2021-04-06 DIAGNOSIS — I25118 Atherosclerotic heart disease of native coronary artery with other forms of angina pectoris: Secondary | ICD-10-CM

## 2021-04-06 DIAGNOSIS — E785 Hyperlipidemia, unspecified: Secondary | ICD-10-CM

## 2021-04-14 ENCOUNTER — Ambulatory Visit: Payer: Medicare HMO | Admitting: Internal Medicine

## 2021-04-15 ENCOUNTER — Other Ambulatory Visit: Payer: Self-pay | Admitting: Cardiology

## 2021-04-16 ENCOUNTER — Encounter: Payer: Self-pay | Admitting: Nurse Practitioner

## 2021-04-16 ENCOUNTER — Ambulatory Visit (INDEPENDENT_AMBULATORY_CARE_PROVIDER_SITE_OTHER): Payer: Medicare HMO | Admitting: Nurse Practitioner

## 2021-04-16 VITALS — BP 141/74 | HR 66 | Temp 98.1°F | Ht 60.0 in | Wt 188.0 lb

## 2021-04-16 DIAGNOSIS — E119 Type 2 diabetes mellitus without complications: Secondary | ICD-10-CM

## 2021-04-16 DIAGNOSIS — R7303 Prediabetes: Secondary | ICD-10-CM | POA: Insufficient documentation

## 2021-04-16 MED ORDER — OZEMPIC (0.25 OR 0.5 MG/DOSE) 2 MG/1.5ML ~~LOC~~ SOPN: 0.5000 mg | PEN_INJECTOR | SUBCUTANEOUS | 2 refills | Status: DC

## 2021-04-16 MED ORDER — BLOOD GLUCOSE MONITOR KIT: PACK | 0 refills | Status: AC

## 2021-04-16 NOTE — Assessment & Plan Note (Addendum)
Chronic, too soon to recheck A1c today.  However, she is feeling well on the Ozempic and has noticed some weight loss.  We will increase Ozempic dose to 0.5 mg injected to the subcutaneous tissue weekly, and have her follow-up in 4 weeks to see how she is tolerating it.  I have also order her glucometer and we did discuss signs and symptoms of hypoglycemia, and when to use her glucometer to check for this.  We also discussed how to treat hypoglycemia and recheck blood sugars 15 minutes after treatment of hypoglycemia.  She reports understanding.  She was again educated on the importance of rotating sites. ?

## 2021-04-16 NOTE — Progress Notes (Signed)
? ? ? ?Subjective:  ?Patient ID: Tonya [name], female    DOB: [birth year]  Age: 71 y.o. MRN: [id number] ? ?CC:  ?Chief Complaint  ?Patient presents with  ? Follow-up  ? Prediabetes  ?  ? ? ?HPI  ?This patient arrives today for the above. ? ?Type 2 diabetes in remission/prediabetes: Patient has a history of type 2 diabetes and underwent Roux-en-Y gastric bypass, this was eventually reversed due to treatment of colon cancer.  She has noticed recently that she has been gaining weight, additionally her last A1c Has been trending upward and is now back in the prediabetic range.  It was in the low fives and is now at 6.0.  Per shared decision making we have decided to trial Ozempic.  She has now been on this for about 4 weeks.  She is tolerating this well.  She has noticed about a 5 pound weight loss.  She has had no symptoms of hypoglycemia (historically she experiences lightheadedness and dizziness with hypoglycemia) but she does not have a glucometer available to her to monitor blood sugars.  She denies any masses on her neck, abdominal pain, nausea, vomiting, or diarrhea.  ? ?Past Medical History:  ?Diagnosis Date  ? Anemia   ? few yrs ago  ? C. difficile diarrhea 03/2018  ? Cancer [facility])   ? rectal  ? CHF (congestive heart failure) ([place])   ? Coronary artery disease 07/2003  ? occluded LAD wtih right-to-left collaterals 07/24/03; no current cardiologist  ? Diabetes mellitus without complication ([place])   ? Elbow fracture, left   ? History of chemotherapy 01/2018  ? radaition last tx jan 2019  ? Myocardial infarction [facility]) age 54's  ? Obesity 2003  ? s/p gastric bypass   ? Primary hypertension 07/30/2020  ? Vitamin D deficiency   ? Wears glasses   ? ? ? ? ?Family History  ?Problem Relation Age of Onset  ? Heart attack Mother   ?     d.93  ? Throat cancer Mother 16  ? Clotting disorder Mother   ? Liver disease Mother   ? Kidney disease Mother   ? Heart attack Father   ?     d.92  ? Prostate cancer Father 10  ? Uterine  cancer Sister 84  ?     treated with total hysterectomy and radiation  ? Thyroid cancer Sister 39  ?     treated with thyroidectomy  ? Uterine cancer Sister 1  ? Cancer Maternal Uncle   ?     d.80s unspecified type of cancer. History of smoking.  ? Colon cancer Cousin 74  ?     paternal first-cousin  ? ? ?Social History  ? ?Social History Narrative  ? Not on file  ? ?Social History  ? ?Tobacco Use  ? Smoking status: Never  ? Smokeless tobacco: Never  ?Substance Use Topics  ? Alcohol use: No  ? ? ? ?Current Meds  ?Medication Sig  ? acetaminophen (TYLENOL) 500 MG tablet Take 1,000 mg by mouth every 8 (eight) hours as needed for moderate pain.  ? aspirin 81 MG chewable tablet Chew 1 tablet (81 mg total) by mouth daily.  ? blood glucose meter kit and supplies KIT Dispense based on patient and insurance preference. Use up to four times daily as directed.  ? cholecalciferol (VITAMIN D) 25 MCG (1000 UNIT) tablet Take 1,000 Units by mouth daily.  ? clopidogrel (PLAVIX) 75 MG tablet Take 75 mg by mouth  daily.  ? ferrous sulfate 325 (65 FE) MG tablet Take 324 mg by mouth daily with breakfast.  ? Insulin Pen Needle 32G X 6 MM MISC 1 Act by Does not apply route once a week.  ? metoprolol tartrate (LOPRESSOR) 25 MG tablet TAKE 1 TABLET BY MOUTH 2 TIMES DAILY  ? Multiple Vitamin (MULTIVITAMIN) tablet Take 1 tablet by mouth daily.  ? olmesartan (BENICAR) 20 MG tablet TAKE 1 TABLET BY MOUTH EVERY DAY  ? rosuvastatin (CRESTOR) 20 MG tablet TAKE 1 TABLET BY MOUTH EVERY DAY  ? [DISCONTINUED] Semaglutide,0.25 or 0.5MG /DOS, (OZEMPIC, 0.25 OR 0.5 MG/DOSE,) 2 MG/1.5ML SOPN Inject 0.5 mg into the skin once a week.  ? ? ?ROS:  ?See HPI ? ? ?Objective:  ? ?Today's Vitals: BP (!) 141/74   Pulse 66   Temp 98.1 ?F (36.7 ?C)   Ht 5' (1.524 m)   Wt 188 lb (85.3 kg)   SpO2 96%   BMI 36.72 kg/m?  ? ?  04/16/2021  ? 10:53 AM 04/16/2021  ? 10:50 AM 03/16/2021  ?  2:10 PM  ?Vitals with BMI  ?Height  5\' 0"  5\' 1"   ?Weight  188 lbs 193 lbs 3 oz  ?BMI   36.72 36.52  ?Systolic 992 426 834  ?Diastolic 74 62 76  ?Pulse  66 72  ?  ? ?Physical Exam ?Vitals reviewed.  ?Constitutional:   ?   General: She is not in acute distress. ?   Appearance: Normal appearance.  ?HENT:  ?   Head: Normocephalic and atraumatic.  ?Neck:  ?   Vascular: No carotid bruit.  ?Cardiovascular:  ?   Rate and Rhythm: Normal rate and regular rhythm.  ?   Pulses: Normal pulses.  ?   Heart sounds: Normal heart sounds.  ?Pulmonary:  ?   Effort: Pulmonary effort is normal.  ?   Breath sounds: Normal breath sounds.  ?Skin: ?   General: Skin is warm and dry.  ?Neurological:  ?   General: No focal deficit present.  ?   Mental Status: She is alert and oriented to person, place, and time.  ?Psychiatric:     ?   Mood and Affect: Mood normal.     ?   Behavior: Behavior normal.     ?   Judgment: Judgment normal.  ? ? ? ? ? ? ? ?Assessment and Plan  ? ?1. Type 2 diabetes mellitus in remission ([place])   ? ? ? ?Plan: ?See plan via problem list below. ? ? ?Tests ordered ?No orders of the defined types were placed in this encounter. ? ? ? ? ?Meds ordered this encounter  ?Medications  ? Semaglutide,0.25 or 0.5MG /DOS, (OZEMPIC, 0.25 OR 0.5 MG/DOSE,) 2 MG/1.5ML SOPN  ?  Sig: Inject 0.5 mg into the skin once a week.  ?  Dispense:  3 mL  ?  Refill:  2  ?  Order Specific Question:   Supervising Provider  ?  Answer:   Binnie Rail [[id number]]  ? blood glucose meter kit and supplies KIT  ?  Sig: Dispense based on patient and insurance preference. Use up to four times daily as directed.  ?  Dispense:  1 each  ?  Refill:  0  ?  Order Specific Question:   Supervising Provider  ?  Answer:   Binnie Rail [[id number]]  ?  Order Specific Question:   Number of strips  ?  Answer:   100  ?  Order Specific Question:  Number of lancets  ?  Answer:   100  ? ? ?Patient to follow-up in 4 weeks or sooner as needed. ? ?Ailene Ards, NP ? ?

## 2021-05-13 ENCOUNTER — Telehealth: Payer: Self-pay

## 2021-05-13 NOTE — Telephone Encounter (Signed)
Pt is calling to see if she can change her appt to a VV appt. I advised the pt that upon approval I would give her a call to let her know. ? ?I advised the pt that it would be 5/11 as Jeralyn Ruths, NP is not in the office today. ? ?Please advise ?

## 2021-05-13 NOTE — Telephone Encounter (Signed)
Thanks

## 2021-05-14 ENCOUNTER — Telehealth (INDEPENDENT_AMBULATORY_CARE_PROVIDER_SITE_OTHER): Payer: Medicare HMO | Admitting: Nurse Practitioner

## 2021-05-14 ENCOUNTER — Encounter: Payer: Self-pay | Admitting: Nurse Practitioner

## 2021-05-14 DIAGNOSIS — E119 Type 2 diabetes mellitus without complications: Secondary | ICD-10-CM | POA: Diagnosis not present

## 2021-05-14 MED ORDER — OZEMPIC (1 MG/DOSE) 4 MG/3ML ~~LOC~~ SOPN: 1.0000 mg | PEN_INJECTOR | SUBCUTANEOUS | 1 refills | Status: DC

## 2021-05-14 NOTE — Assessment & Plan Note (Signed)
Chronic, A1c will be due to be checked at next office visit.  Patient continues to tolerate Ozempic.  Per shared decision making we will titrate up to 1 mg injected weekly.  Patient was counseled to monitor for hypoglycemia, and notify me if this occurs.  She will follow-up in approximately 5 weeks to see how she is tolerating the Ozempic and have A1c rechecked.  She will continue on her rosuvastatin 20 mg by mouth daily and olmesartan 20 mg a mouth daily. ?

## 2021-05-14 NOTE — Progress Notes (Signed)
? ?An audio-only tele-health visit was conducted today. I connected with  Tonya [name] on 05/14/21 utilizing audio-only technology and verified that I am speaking with the correct person using two identifiers. The patient was located at their home, and I was located at the office of [place] at [facility] during the encounter. I discussed the limitations of evaluation and management by telemedicine. The patient expressed understanding and agreed to proceed.  ? ? ?Subjective:  ?Patient ID: Tonya [name], female    DOB: [birth year]  Age: 71 y.o. MRN: [id number] ? ?CC:  ?Chief Complaint  ?Patient presents with  ? Other  ?  Diabetes in remission  ?  ? ? ?HPI  ?This patient arrives today for the above. ? ?Diabetes: Last A1c was collected on 02/20/2021 and it was 6.0.  She continues on Ozempic 0.5 mg weekly.  She is tolerating medication well.  She denies any negative side effects or noticeable masses to her neck.  She denies any hypoglycemic episodes.  She continues on rosuvastatin and olmesartan. ? ?Past Medical History:  ?Diagnosis Date  ? Anemia   ? few yrs ago  ? C. difficile diarrhea 03/2018  ? Cancer [facility])   ? rectal  ? CHF (congestive heart failure) ([place])   ? Coronary artery disease 07/2003  ? occluded LAD wtih right-to-left collaterals 07/24/03; no current cardiologist  ? Diabetes mellitus without complication ([place])   ? Elbow fracture, left   ? History of chemotherapy 01/2018  ? radaition last tx jan 2019  ? Myocardial infarction [facility]) age 14's  ? Obesity 2003  ? s/p gastric bypass   ? Primary hypertension 07/30/2020  ? Vitamin D deficiency   ? Wears glasses   ? ? ? ? ?Family History  ?Problem Relation Age of Onset  ? Heart attack Mother   ?     d.93  ? Throat cancer Mother 37  ? Clotting disorder Mother   ? Liver disease Mother   ? Kidney disease Mother   ? Heart attack Father   ?     d.92  ? Prostate cancer Father 9  ? Uterine cancer Sister 68  ?     treated with total hysterectomy and  radiation  ? Thyroid cancer Sister 43  ?     treated with thyroidectomy  ? Uterine cancer Sister 77  ? Cancer Maternal Uncle   ?     d.80s unspecified type of cancer. History of smoking.  ? Colon cancer Cousin 43  ?     paternal first-cousin  ? ? ?Social History  ? ?Social History Narrative  ? Not on file  ? ?Social History  ? ?Tobacco Use  ? Smoking status: Never  ? Smokeless tobacco: Never  ?Substance Use Topics  ? Alcohol use: No  ? ? ? ?Current Meds  ?Medication Sig  ? acetaminophen (TYLENOL) 500 MG tablet Take 1,000 mg by mouth every 8 (eight) hours as needed for moderate pain.  ? aspirin 81 MG chewable tablet Chew 1 tablet (81 mg total) by mouth daily.  ? cholecalciferol (VITAMIN D) 25 MCG (1000 UNIT) tablet Take 1,000 Units by mouth daily.  ? clopidogrel (PLAVIX) 75 MG tablet Take 75 mg by mouth daily.  ? ferrous sulfate 325 (65 FE) MG tablet Take 324 mg by mouth daily with breakfast.  ? metoprolol tartrate (LOPRESSOR) 25 MG tablet TAKE 1 TABLET BY MOUTH 2 TIMES DAILY  ? Multiple Vitamin (MULTIVITAMIN) tablet Take 1 tablet by mouth  daily.  ? olmesartan (BENICAR) 20 MG tablet TAKE 1 TABLET BY MOUTH EVERY DAY  ? rosuvastatin (CRESTOR) 20 MG tablet TAKE 1 TABLET BY MOUTH EVERY DAY  ? Semaglutide, 1 MG/DOSE, (OZEMPIC, 1 MG/DOSE,) 4 MG/3ML SOPN Inject 1 mg into the skin once a week.  ? [DISCONTINUED] Semaglutide,0.25 or 0.'5MG'$ /DOS, (OZEMPIC, 0.25 OR 0.5 MG/DOSE,) 2 MG/1.5ML SOPN Inject 0.5 mg into the skin once a week.  ? ? ?ROS:  ?See HPI ? ? ?Objective:  ? ?Today's Vitals: Ht 5' (1.524 m)   Wt 184 lb (83.5 kg)   BMI 35.94 kg/m?  ? ?  05/14/2021  ? 10:54 AM 04/16/2021  ? 10:53 AM 04/16/2021  ? 10:50 AM  ?Vitals with BMI  ?Height '5\' 0"'$   '5\' 0"'$   ?Weight 184 lbs  188 lbs  ?BMI 35.94  36.72  ?Systolic  528 413  ?Diastolic  74 62  ?Pulse   66  ?  ? ?Physical Exam ?Comprehensive physical exam not completed today as office visit was conducted remotely.  Patient sounded well over the phone.  Patient was alert and oriented,  and appeared to have appropriate judgment. ? ? ? ? ? ? ?Assessment and Plan  ? ?1. Type 2 diabetes mellitus in remission ([place])   ? ? ? ?Plan: ?See plan via problem list below. ? ? ?Tests ordered ?No orders of the defined types were placed in this encounter. ? ? ? ? ?Meds ordered this encounter  ?Medications  ? Semaglutide, 1 MG/DOSE, (OZEMPIC, 1 MG/DOSE,) 4 MG/3ML SOPN  ?  Sig: Inject 1 mg into the skin once a week.  ?  Dispense:  3 mL  ?  Refill:  1  ?  Order Specific Question:   Supervising Provider  ?  Answer:   Binnie Rail [[id number]]  ? ? ?Patient to follow-up in 5 weeks, or sooner as needed.  Total time spent with telephone was 9 minutes and 25 seconds. ? ?Ailene Ards, NP ? ?

## 2021-05-19 ENCOUNTER — Other Ambulatory Visit: Payer: Self-pay | Admitting: Cardiology

## 2021-05-27 ENCOUNTER — Other Ambulatory Visit: Payer: Self-pay | Admitting: Internal Medicine

## 2021-05-27 DIAGNOSIS — I110 Hypertensive heart disease with heart failure: Secondary | ICD-10-CM

## 2021-05-27 DIAGNOSIS — I10 Essential (primary) hypertension: Secondary | ICD-10-CM

## 2021-06-19 ENCOUNTER — Ambulatory Visit (INDEPENDENT_AMBULATORY_CARE_PROVIDER_SITE_OTHER): Payer: Medicare HMO | Admitting: Nurse Practitioner

## 2021-06-19 ENCOUNTER — Encounter: Payer: Self-pay | Admitting: Nurse Practitioner

## 2021-06-19 VITALS — BP 108/62 | HR 71 | Temp 98.3°F | Ht 60.0 in | Wt 175.8 lb

## 2021-06-19 DIAGNOSIS — Z7901 Long term (current) use of anticoagulants: Secondary | ICD-10-CM | POA: Insufficient documentation

## 2021-06-19 DIAGNOSIS — E119 Type 2 diabetes mellitus without complications: Secondary | ICD-10-CM | POA: Diagnosis not present

## 2021-06-19 DIAGNOSIS — Z23 Encounter for immunization: Secondary | ICD-10-CM | POA: Insufficient documentation

## 2021-06-19 LAB — HEMOGLOBIN A1C: Hgb A1c MFr Bld: 5.6 % (ref 4.6–6.5)

## 2021-06-19 LAB — BASIC METABOLIC PANEL
BUN: 17 mg/dL (ref 6–23)
CO2: 30 mEq/L (ref 19–32)
Calcium: 9.6 mg/dL (ref 8.4–10.5)
Chloride: 104 mEq/L (ref 96–112)
Creatinine, Ser: 0.77 mg/dL (ref 0.40–1.20)
GFR: 78.88 mL/min (ref >60.00)
Glucose, Bld: 93 mg/dL (ref 70–99)
Potassium: 3.7 mEq/L (ref 3.5–5.1)
Sodium: 141 mEq/L (ref 135–145)

## 2021-06-19 MED ORDER — SEMAGLUTIDE (2 MG/DOSE) 8 MG/3ML ~~LOC~~ SOPN: 2.0000 mg | PEN_INJECTOR | SUBCUTANEOUS | 1 refills | Status: DC

## 2021-06-19 NOTE — Assessment & Plan Note (Signed)
Patient has had the PCV 23 vaccine on 06/16/17 & 09/05/19 and is due for PCV 20. Educated on side effects, risks and benefits for the vaccine. Patient agreeable to vaccine today. VIS given

## 2021-06-19 NOTE — Assessment & Plan Note (Signed)
Chronic, A1c done at today's visit. Patient continues to tolerate Ozempic '1mg'$  dose. Patient states that Tonya [name] has had some nightmares, but not too bad. Tonya [name] has lost 8.2 lbs since her last visit. Will increase dose to '2mg'$ /dose injection every week. Tonya [name] will follow-up in 4-5 weeks to make sure Tonya [name] continues to tolerate the Ozempic. Tonya [name] will continue on her rosuvastatin 20 mg by mouth daily and olmesartan 20 mg by mouth daily.

## 2021-06-19 NOTE — Progress Notes (Signed)
Established Patient Office Visit  Subjective   Patient ID: Tonya [name], female    DOB: May 04, 1950  Age: 71 y.o. MRN: [id number]  Chief Complaint  Patient presents with   Follow-up  - Diabetes Type II in remision.   Diabetes She presents for her follow-up diabetic visit. She has type 2 diabetes mellitus. Her disease course has been improving. Pertinent negatives for hypoglycemia include no headaches or nervousness/anxiousness. Pertinent negatives for diabetes include no blurred vision, no chest pain, no foot paresthesias and no foot ulcerations. There are no hypoglycemic complications. Symptoms are improving. Risk factors for coronary artery disease include obesity and post-menopausal. Current diabetic treatments: Ozempic - reports nightmares, but not too bad. She is compliant with treatment all of the time. Her weight is decreasing steadily. She is following a generally healthy diet. An ACE inhibitor/angiotensin II receptor blocker is being taken. She does not see a podiatrist.Eye exam is not current [place] will schedule).  Last A1C was 6.0, history of diabetes that has been in remission since gastric bypass. She had her bypass reversed secondary to colon cancer treatment. Since then weight and A1C have been trending upwards.      Review of Systems  Constitutional:  Negative for fever.  HENT:  Negative for congestion, sinus pain and sore throat.   Eyes:  Negative for blurred vision.  Respiratory:  Negative for cough and shortness of breath.   Cardiovascular:  Negative for chest pain, palpitations and leg swelling.  Gastrointestinal:  Negative for diarrhea.  Musculoskeletal:  Negative for joint pain and myalgias.  Neurological:  Negative for headaches.  Psychiatric/Behavioral:  Negative for depression. The patient is not nervous/anxious.       Objective:     BP 108/62 (BP Location: Right Arm, Patient Position: Sitting, Cuff Size: Large)   Pulse 71   Temp 98.3 F (36.8 C)  (Oral)   Ht 5' (1.524 m)   Wt 175 lb 12.8 oz (79.7 kg)   SpO2 98%   BMI 34.33 kg/m  BP Readings from Last 3 Encounters:  06/19/21 108/62  04/16/21 (!) 141/74  03/16/21 128/76   Wt Readings from Last 3 Encounters:  06/19/21 175 lb 12.8 oz (79.7 kg)  05/14/21 184 lb (83.5 kg)  04/16/21 188 lb (85.3 kg)      Physical Exam Vitals reviewed.  Constitutional:      Appearance: Normal appearance. She is obese.  HENT:     Head: Normocephalic.     Nose: Nose normal.  Cardiovascular:     Rate and Rhythm: Normal rate and regular rhythm.     Pulses:          Dorsalis pedis pulses are 1+ on the right side and 1+ on the left side.       Posterior tibial pulses are 1+ on the right side and 1+ on the left side.     Heart sounds: Normal heart sounds.  Pulmonary:     Effort: Pulmonary effort is normal.     Breath sounds: Normal breath sounds.  Feet:     Right foot:     Protective Sensation: 10 sites tested.  8 sites sensed.     Skin integrity: Callus and dry skin present.     Toenail Condition: Right toenails are abnormally thick. Fungal disease present.    Left foot:     Protective Sensation: 10 sites tested.  8 sites sensed.     Skin integrity: Callus and dry skin present.  Toenail Condition: Left toenails are abnormally thick. Fungal disease present. Skin:    General: Skin is warm.     Findings: Erythema present.     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   Neurological:     Mental Status: She is alert.  Psychiatric:        Mood and Affect: Mood normal.        Behavior: Behavior normal.      No results found for any visits on 06/19/21.  Last CBC Lab Results  Component Value Date   WBC 5.8 10/16/2020   HGB 11.1 (L) 10/16/2020   HCT 33.9 (L) 10/16/2020   MCV 94.0 10/16/2020   MCH 30.8 10/02/2019   RDW 13.7 10/16/2020   PLT 226.0 10/16/2020   Last hemoglobin A1c Lab Results  Component Value Date   HGBA1C 6.0 02/20/2021       The ASCVD Risk score (Arnett DK, et al., 2019) failed to calculate for the following reasons:   The patient has a prior MI or stroke diagnosis    Assessment & Plan:   Problem List Items Addressed This Visit       Endocrine   Type 2 diabetes mellitus in remission ([place]) - Primary    Chronic, A1c done at today's visit. Patient continues to tolerate Ozempic '1mg'$  dose. Patient states that she has had some nightmares, but not too bad. She has lost 8.2 lbs since her last visit. Will increase dose to '2mg'$ /dose injection every week. She will follow-up in 4-5 weeks to make sure she continues to tolerate the Ozempic. She will continue on her rosuvastatin 20 mg by mouth daily and olmesartan 20 mg by mouth daily.      Relevant Medications   Semaglutide, 2 MG/DOSE, 8 MG/3ML SOPN   Other Relevant Orders   HgB N0U   Basic Metabolic Panel (BMET)     Other   Need for Streptococcus pneumoniae vaccination    Patient has had the PCV 23 vaccine on 06/16/17 & 09/05/19 and is due for PCV 20. Educated on side effects, risks and benefits for the vaccine. Patient agreeable to vaccine today. VIS given      Relevant Orders   Pneumococcal conjugate vaccine 20-valent (Prevnar 20) (Completed)    Return in about 4 weeks (around 07/17/2021) for follow-up with Clairessa Boulet or Dr.Jones in 4 weeks.    Glade Nurse, RN

## 2021-07-14 ENCOUNTER — Other Ambulatory Visit: Payer: Self-pay | Admitting: Cardiology

## 2021-07-14 DIAGNOSIS — I25118 Atherosclerotic heart disease of native coronary artery with other forms of angina pectoris: Secondary | ICD-10-CM

## 2021-07-14 DIAGNOSIS — E785 Hyperlipidemia, unspecified: Secondary | ICD-10-CM

## 2021-07-17 ENCOUNTER — Telehealth (INDEPENDENT_AMBULATORY_CARE_PROVIDER_SITE_OTHER): Payer: Medicare HMO | Admitting: Nurse Practitioner

## 2021-07-17 VITALS — Ht 60.0 in | Wt 171.0 lb

## 2021-07-17 DIAGNOSIS — E119 Type 2 diabetes mellitus without complications: Secondary | ICD-10-CM

## 2021-07-17 NOTE — Assessment & Plan Note (Signed)
Continue ozempic '2mg'$ /week, rosuvastatin '20mg'$ /day, and benicar '20mg'$ /day. Follow-up with PCP in 3 months.

## 2021-07-17 NOTE — Progress Notes (Signed)
   Established Patient Office Visit  An audio/visual tele-health visit was completed today for this patient. I connected with  Tonya [name] on 07/17/21 utilizing audio/visual technology and verified that I am speaking with the correct person using two identifiers. The patient was located at their home, and I was located at the office of [place] at [facility] during the encounter. I discussed the limitations of evaluation and management by telemedicine. The patient expressed understanding and agreed to proceed.    Subjective   Patient ID: Tonya [name], female    DOB: 04-28-1950  Age: 71 y.o. MRN: [id number]  Chief Complaint  Patient presents with   Diabetes    Patient continues on ozempic, dose increased to '2mg'$  at last OV. She reports tolerating it well. Has lost approximately 4 additional pounds. Last A1C 5.6 on 06/19/21. Also on statin and ARB.     Review of Systems  HENT:         (-) neck mass  Gastrointestinal:  Negative for abdominal pain and constipation.  Psychiatric/Behavioral:  Negative for depression and suicidal ideas.       Objective:     Ht 5' (1.524 m)   Wt 171 lb (77.6 kg)   BMI 33.40 kg/m  BP Readings from Last 3 Encounters:  06/19/21 108/62  04/16/21 (!) 141/74  03/16/21 128/76   Wt Readings from Last 3 Encounters:  07/17/21 171 lb (77.6 kg)  06/19/21 175 lb 12.8 oz (79.7 kg)  05/14/21 184 lb (83.5 kg)      Physical Exam Comprehensive physical exam not completed today as office visit was conducted remotely.  Patient appears well over video.  Patient was alert and oriented, and appeared to have appropriate judgment.   No results found for any visits on 07/17/21.    The ASCVD Risk score (Arnett DK, et al., 2019) failed to calculate for the following reasons:   The patient has a prior MI or stroke diagnosis    Assessment & Plan:   Problem List Items Addressed This Visit       Endocrine   Type 2 diabetes mellitus in  remission ([place]) - Primary    Continue ozempic '2mg'$ /week, rosuvastatin '20mg'$ /day, and benicar '20mg'$ /day. Follow-up with PCP in 3 months.        No follow-ups on file.    Ailene Ards, NP

## 2021-07-20 ENCOUNTER — Other Ambulatory Visit: Payer: Self-pay | Admitting: Nurse Practitioner

## 2021-07-20 DIAGNOSIS — E119 Type 2 diabetes mellitus without complications: Secondary | ICD-10-CM

## 2021-07-21 ENCOUNTER — Other Ambulatory Visit: Payer: Self-pay | Admitting: Cardiology

## 2021-07-22 ENCOUNTER — Encounter: Payer: Self-pay | Admitting: Internal Medicine

## 2021-07-22 ENCOUNTER — Other Ambulatory Visit: Payer: Self-pay | Admitting: Internal Medicine

## 2021-07-23 ENCOUNTER — Other Ambulatory Visit: Payer: Self-pay | Admitting: Nurse Practitioner

## 2021-07-23 ENCOUNTER — Encounter: Payer: Self-pay | Admitting: Nurse Practitioner

## 2021-07-23 DIAGNOSIS — E119 Type 2 diabetes mellitus without complications: Secondary | ICD-10-CM

## 2021-07-23 MED ORDER — OZEMPIC (1 MG/DOSE) 4 MG/3ML ~~LOC~~ SOPN: 1.0000 mg | PEN_INJECTOR | SUBCUTANEOUS | 2 refills | Status: DC

## 2021-08-17 ENCOUNTER — Other Ambulatory Visit: Payer: Self-pay | Admitting: Internal Medicine

## 2021-08-17 DIAGNOSIS — I110 Hypertensive heart disease with heart failure: Secondary | ICD-10-CM

## 2021-08-17 DIAGNOSIS — I10 Essential (primary) hypertension: Secondary | ICD-10-CM

## 2021-09-09 ENCOUNTER — Other Ambulatory Visit: Payer: Self-pay | Admitting: Internal Medicine

## 2021-09-09 ENCOUNTER — Telehealth: Payer: Self-pay | Admitting: Internal Medicine

## 2021-09-09 ENCOUNTER — Other Ambulatory Visit: Payer: Self-pay | Admitting: Cardiology

## 2021-09-09 DIAGNOSIS — I10 Essential (primary) hypertension: Secondary | ICD-10-CM

## 2021-09-09 DIAGNOSIS — I110 Hypertensive heart disease with heart failure: Secondary | ICD-10-CM

## 2021-09-09 DIAGNOSIS — E119 Type 2 diabetes mellitus without complications: Secondary | ICD-10-CM

## 2021-09-09 MED ORDER — SEMAGLUTIDE (2 MG/DOSE) 8 MG/3ML ~~LOC~~ SOPN: 2.0000 mg | PEN_INJECTOR | SUBCUTANEOUS | 0 refills | Status: DC

## 2021-09-09 NOTE — Telephone Encounter (Signed)
[facility] is requesting a refill on Ozempic - 2 mg.

## 2021-09-10 ENCOUNTER — Other Ambulatory Visit: Payer: Self-pay

## 2021-09-10 ENCOUNTER — Encounter: Payer: Self-pay | Admitting: Nurse Practitioner

## 2021-09-10 DIAGNOSIS — E119 Type 2 diabetes mellitus without complications: Secondary | ICD-10-CM

## 2021-09-10 MED ORDER — SEMAGLUTIDE (2 MG/DOSE) 8 MG/3ML ~~LOC~~ SOPN: 2.0000 mg | PEN_INJECTOR | SUBCUTANEOUS | 0 refills | Status: DC

## 2021-09-15 ENCOUNTER — Other Ambulatory Visit: Payer: Self-pay | Admitting: Nurse Practitioner

## 2021-09-15 DIAGNOSIS — E119 Type 2 diabetes mellitus without complications: Secondary | ICD-10-CM

## 2021-09-16 ENCOUNTER — Ambulatory Visit: Payer: Medicare HMO | Admitting: Student

## 2021-09-16 ENCOUNTER — Other Ambulatory Visit: Payer: Self-pay | Admitting: Cardiology

## 2021-10-06 ENCOUNTER — Encounter: Payer: Self-pay | Admitting: Cardiology

## 2021-10-06 ENCOUNTER — Ambulatory Visit: Payer: Medicare HMO | Admitting: Cardiology

## 2021-10-06 VITALS — BP 120/65 | HR 79 | Temp 97.7°F | Resp 16 | Ht 60.0 in | Wt 160.4 lb

## 2021-10-06 DIAGNOSIS — E785 Hyperlipidemia, unspecified: Secondary | ICD-10-CM | POA: Diagnosis not present

## 2021-10-06 DIAGNOSIS — I25118 Atherosclerotic heart disease of native coronary artery with other forms of angina pectoris: Secondary | ICD-10-CM | POA: Diagnosis not present

## 2021-10-06 DIAGNOSIS — I10 Essential (primary) hypertension: Secondary | ICD-10-CM

## 2021-10-06 MED ORDER — SEMAGLUTIDE (1 MG/DOSE) 4 MG/3ML ~~LOC~~ SOPN: 1.0000 mg | PEN_INJECTOR | SUBCUTANEOUS | 0 refills | Status: DC

## 2021-10-06 NOTE — Progress Notes (Signed)
Primary Physician/Referring:  Etta Grandchild, MD  Patient ID: Tonya [name], female    DOB: 1950/11/11, 71 y.o.   MRN: [id number]  Chief Complaint  Patient presents with   Coronary Artery Disease   Hypertension   Follow-up   HPI:    Tonya [name]  is a 71 y.o. caucasian female with a past medical history significant for Coronary disease and complex angioplasty to proximal to mid to distal segment of the LAD on 08/26/2020, hypertension, chronic diastolic heart failure heart failure, hyperlipidemia, obesity, rectal adenocarcinoma, s/p resection with colostomy, and gastric bypass.   Patient presents here for a 55-month office visit of coronary disease and also obesity management along with hypertension management.    Patient has had no recurrence of dyspnea or chest pain since angioplasty in 08/2020.  She is presently asymptomatic.  Past Medical History:  Diagnosis Date   Anemia    few yrs ago   C. difficile diarrhea 03/2018   Cancer (HCC)    rectal   CHF (congestive heart failure) (HCC)    Coronary artery disease 07/2003   occluded LAD wtih right-to-left collaterals 07/24/03; no current cardiologist   Diabetes mellitus without complication (HCC)    Elbow fracture, left    History of chemotherapy 01/2018   radaition last tx jan 2019   Myocardial infarction [facility]) age 54's   Obesity 2003   s/p gastric bypass    Primary hypertension 07/30/2020   Vitamin D deficiency    Wears glasses      Social History   Tobacco Use   Smoking status: Never   Smokeless tobacco: Never  Substance Use Topics   Alcohol use: No   Marital Status: Single  ROS  Review of Systems  Cardiovascular:  Positive for leg swelling (chronic, stable). Negative for chest pain, claudication and dyspnea on exertion.  Gastrointestinal:  Positive for diarrhea (hx of rectal cancer).   Objective  Blood pressure 120/65, pulse 79, temperature 97.7 F (36.5 C), temperature source Temporal, resp. rate  16, height 5' (1.524 m), weight 160 lb 6.4 oz (72.8 kg), SpO2 97 %. Body mass index is 31.33 kg/m.     10/06/2021    3:04 PM 07/17/2021    3:59 PM 06/19/2021   10:10 AM  Vitals with BMI  Height 5\' 0"  5\' 0"  5\' 0"   Weight 160 lbs 6 oz 171 lbs 175 lbs 13 oz  BMI 31.33 33.4 34.33  Systolic 120  108  Diastolic 65  62  Pulse 79  71    Physical Exam Vitals reviewed.  Neck:     Vascular: No carotid bruit or JVD.  Cardiovascular:     Rate and Rhythm: Normal rate and regular rhythm.     Pulses:          Radial pulses are 2+ on the right side and 2+ on the left side.       Femoral pulses are 2+ on the right side and 2+ on the left side.      Popliteal pulses are 2+ on the right side and 2+ on the left side.       Dorsalis pedis pulses are 2+ on the right side and 2+ on the left side.       Posterior tibial pulses are 0 on the right side and 0 on the left side.     Heart sounds: Normal heart sounds. No murmur heard.    No gallop.  Pulmonary:     Effort:  Pulmonary effort is normal.     Breath sounds: Normal breath sounds.  Abdominal:     General: Bowel sounds are normal.     Palpations: Abdomen is soft.  Musculoskeletal:     Right lower leg: Edema (minimal) present.     Left lower leg: Edema (minimal) present.  Skin:    Findings: Erythema (to the anterior bilateral lower legs) present.      Laboratory examination:   Lab Results  Component Value Date   NA 141 06/19/2021   K 3.7 06/19/2021   CO2 30 06/19/2021   GLUCOSE 93 06/19/2021   BUN 17 06/19/2021   CREATININE 0.77 06/19/2021   CALCIUM 9.6 06/19/2021   EGFR >60 01/05/2017   GFRNONAA >60 03/28/2020    CrCl cannot be calculated (Patient's most recent lab result is older than the maximum 21 days allowed.).     Latest Ref Rng & Units 06/19/2021   10:58 AM 02/20/2021   10:16 AM 10/16/2020    4:52 PM  CMP  Glucose 70 - 99 mg/dL 93  99  96   BUN 6 - 23 mg/dL 17  26  33   Creatinine 0.40 - 1.20 mg/dL 0.77  0.80  0.88    Sodium 135 - 145 mEq/L 141  138  139   Potassium 3.5 - 5.1 mEq/L 3.7  4.7  4.7   Chloride 96 - 112 mEq/L 104  105  106   CO2 19 - 32 mEq/L $Remove'30  30  24   'jgvephB$ Calcium 8.4 - 10.5 mg/dL 9.6  9.5  9.4   Total Protein 6.0 - 8.3 g/dL  7.1  6.9   Total Bilirubin 0.2 - 1.2 mg/dL  0.3  0.2   Alkaline Phos 39 - 117 U/L  53  56   AST 0 - 37 U/L  24  21   ALT 0 - 35 U/L  26  23       Latest Ref Rng & Units 10/16/2020    4:52 PM 07/30/2020    2:32 PM 10/02/2019    8:36 AM  CBC  WBC 4.0 - 10.5 K/uL 5.8  6.4  7.1   Hemoglobin 12.0 - 15.0 g/dL 11.1  12.0  12.2   Hematocrit 36.0 - 46.0 % 33.9  37.0  37.6   Platelets 150.0 - 400.0 K/uL 226.0  258.0  247     Lab Results  Component Value Date   CHOL 130 03/30/2021   HDL 60 03/30/2021   LDLCALC 51 03/30/2021   TRIG 106 03/30/2021   CHOLHDL 2.8 09/05/2019    HEMOGLOBIN A1C Lab Results  Component Value Date   HGBA1C 5.6 06/19/2021   MPG 100 05/03/2013   TSH No results for input(s): "TSH" in the last 8760 hours.  Allergies   Allergies  Allergen Reactions   Bactrim [Sulfamethoxazole-Trimethoprim] Anaphylaxis, Hives and Other (See Comments)   Phenergan [Promethazine Hcl]     Hives   Sulfa Antibiotics     anaphylaxis   Penicillins Other (See Comments)    REACTION: As a child.  CEPHALOSPORIN TOLERANT 08/02/2019     Medication   Current Outpatient Medications:    acetaminophen (TYLENOL) 500 MG tablet, Take 1,000 mg by mouth every 8 (eight) hours as needed for moderate pain., Disp: , Rfl:    aspirin 81 MG chewable tablet, Chew 1 tablet (81 mg total) by mouth daily., Disp: 90 tablet, Rfl: 1   blood glucose meter kit and supplies KIT, Dispense based on patient  and insurance preference. Use up to four times daily as directed., Disp: 1 each, Rfl: 0   cholecalciferol (VITAMIN D) 25 MCG (1000 UNIT) tablet, Take 1,000 Units by mouth daily., Disp: , Rfl:    ferrous sulfate 325 (65 FE) MG tablet, Take 324 mg by mouth daily with breakfast., Disp: ,  Rfl:    Insulin Pen Needle 32G X 6 MM MISC, 1 Act by Does not apply route once a week., Disp: 50 each, Rfl: 0   Lancets (ONETOUCH DELICA PLUS [id number]) MISC, SMARTSIG:Via Meter 1-4 Times Daily, Disp: , Rfl:    metoprolol tartrate (LOPRESSOR) 25 MG tablet, TAKE 1 TABLET BY MOUTH 2 TIMES DAILY (Patient taking differently: Take 25 mg by mouth daily at 12 noon.), Disp: 60 tablet, Rfl: 1   Multiple Vitamin (MULTIVITAMIN) tablet, Take 1 tablet by mouth daily., Disp: , Rfl:    olmesartan (BENICAR) 20 MG tablet, TAKE 1 TABLET BY MOUTH EVERY DAY, Disp: 100 tablet, Rfl: 0   ONETOUCH ULTRA test strip, SMARTSIG:Via Meter 1-4 Times Daily, Disp: , Rfl:    rosuvastatin (CRESTOR) 20 MG tablet, TAKE 1 TABLET BY MOUTH EVERY DAY, Disp: 100 tablet, Rfl: 0   OZEMPIC, 2 MG/DOSE, 8 MG/3ML SOPN, INJECT $RemoveBefo'2MG'rSnslmXIYaN$  SUBCUTANEOUSLY ONCE A WEEK AS DIRECTED, Disp: 3 mL, Rfl: 0   Semaglutide, 1 MG/DOSE, 4 MG/3ML SOPN, Inject 1 mg into the skin once a week., Disp: 3 mL, Rfl: 0    Radiology:   No results found. CT Chest 10/02/2018: 1. Redemonstrated postoperative findings of rectal resection and reanastomosis. No evidence of recurrent mass or abnormal soft tissue in the pelvis. 2. No evidence of metastatic disease in the chest, abdomen, or pelvis 3. There is a new, lobulated soft tissue nodule in the superficial left gluteal soft tissues measuring 3.0 x 2.2 cm (series 2, image 91). This is likely related to injection or trauma, and a very unlikely manifestation of metastatic disease. This can be further evaluated by ultrasound if desired. 4. 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 This can be further interrogated by fluoroscopy if desired. 5. Coronary artery disease. Aortic Atherosclerosis (ICD10-I70.0) and Emphysema (ICD10-J43.9).  CXR 08/02/2019:  Heart size within normal limits. No appreciable airspace consolidation or pulmonary edema. No evidence of pleural  effusion or pneumothorax. No acute bony abnormality is identified.  CT scan of the chest and abdomen for colorectal cancer surveillance 10/02/2019: 1. Redemonstrated postoperative findings of rectal resection and reanastomosis. 2. No evidence of locally recurrent or metastatic disease in the chest, abdomen, or pelvis. 3. 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 4. Nonobstructive right nephrolithiasis. 5. Coronary artery disease.  Aortic Atherosclerosis  Cardiovascular: Aortic atherosclerosis. Normal heart size. three-vessel coronary artery calcifications. No pericardial effusion.   Cardiac Studies:   Echocardiogram 05/07/2013: Left ventricle: The cavity size was normal. Wall thickness  was increased in a pattern of mild LVH. Systolic function  was normal. The estimated ejection fraction was in the range  of 55% to 60%. Wall motion was normal; there were no  regional wall motion abnormalities. Doppler parameters are  consistent with abnormal left ventricular relaxation (grade  1 diastolic dysfunction).    Nuclear Stress Tests 08/08/2020 1. Fixed perfusion defect in mid to apical anterior/anteroseptal wall and apex with hypokinesis in this region, consistent with prior infarct 2. Fixed inferolateral perfusion defect with normal wall motion, consistent with artifact 3. Mild systolic dysfunction (EF 37%) 4. Intermediate  risk study, suggests LAD territory infarct  Left heart catheterization 08/26/2020: LV: 112/-1, EDP 1 mmHg.  Ao 108/47, mean 70 mmHg.  There was no pressure gradient across the aortic valve.   Angiographic data: LV: Normal LV systolic function.  No wall motion abnormality.  LVEF 55 to 60%. RCA: Very large caliber vessel with mild luminal irregularity.  Dominant.  Gives origin to large PDA and a large PL branch.  There is collaterals noted from the right coronary artery to the distal LAD. LM:  Large vessel, smooth. Circumflex: Large caliber vessel, moderate-sized OM1, mild disease. LAD: Large vessel proximally.  There is a 90 to 95% stenosis in the proximal LAD followed by origin of a very large D1 which is LAD equivalent.  Just after D1, there is a large septal perforator to and at the site of SP2, the LAD is chronically occluded.  No significant collaterals were evident.    08/26/2020: Proximal and mid LAD, mid LAD CTO with implantation of 3 overlapping stents from proximal to distal 3.0 x 18 mm Onyx 1.,  Mid segment with a 2.5 x 30 mm Onyx frontier and distal 2.0 x 12 mm Onyx frontier DES,  EKG:   EKG 10/06/2021: NSR @ 72/min. Normal EKG  Assessment     ICD-10-CM   1. Atherosclerosis of native coronary artery of native heart with stable angina pectoris ([place])  I25.118 EKG 12-Lead    2. Primary hypertension  I10     3. Hyperlipidemia LDL goal <70  E78.5        Medications Discontinued During This Encounter  Medication Reason   clopidogrel (PLAVIX) 75 MG tablet Completed Course    No orders of the defined types were placed in this encounter.   Orders Placed This Encounter  Procedures   EKG 12-Lead   Recommendations:   Tonya [name] is a 71 y.o. Caucasian female with a past medical history significant for Coronary disease and complex angioplasty to proximal to mid to distal segment of the LAD on 08/26/2020, hypertension, chronic diastolic heart failure heart failure, hyperlipidemia, obesity, rectal adenocarcinoma, s/p resection with colostomy, and gastric bypass.   Patient presents here for a 59-month office visit of coronary disease and also obesity management along with hypertension management.  Since being on Ozempic, she has lost about 35 pounds in weight since last office visit 6 months ago.  States that her dyspnea has completely resolved, she has not had any further chest pain, feels the best she has in quite a while.  Blood pressure is well controlled,  lipids under excellent control as well.  As it is being a year since angioplasty and stenting, patient remains asymptomatic, normal EKG, I have discontinued clopidogrel and will continue aspirin alone.  She is having difficulty obtaining 2 mg of Ozempic, hence as there is shortage of 2 mg, I have prescribed a 1 mg dose for now and hopefully the short that should be completed in 2 to 3 weeks as per the company representative.  I have congratulated her on losing weight, again discussed regarding psychological factors involved with recurrent weight gain and encouraged her to be cognizant of high calorie diet.  I will see her back in 6 months or sooner if problems.      Adrian Prows, PA-C 10/06/2021, 9:38 PM Office: [phone]

## 2021-10-07 ENCOUNTER — Encounter: Payer: Self-pay | Admitting: Cardiology

## 2021-10-08 ENCOUNTER — Other Ambulatory Visit: Payer: Self-pay | Admitting: Cardiology

## 2021-10-08 ENCOUNTER — Other Ambulatory Visit: Payer: Self-pay

## 2021-10-08 DIAGNOSIS — I25118 Atherosclerotic heart disease of native coronary artery with other forms of angina pectoris: Secondary | ICD-10-CM

## 2021-10-08 DIAGNOSIS — E785 Hyperlipidemia, unspecified: Secondary | ICD-10-CM

## 2021-10-08 DIAGNOSIS — E119 Type 2 diabetes mellitus without complications: Secondary | ICD-10-CM

## 2021-10-19 ENCOUNTER — Ambulatory Visit: Payer: Medicare HMO | Admitting: Internal Medicine

## 2021-10-20 ENCOUNTER — Ambulatory Visit (INDEPENDENT_AMBULATORY_CARE_PROVIDER_SITE_OTHER): Payer: Medicare HMO | Admitting: Internal Medicine

## 2021-10-20 ENCOUNTER — Encounter: Payer: Self-pay | Admitting: Internal Medicine

## 2021-10-20 ENCOUNTER — Ambulatory Visit (INDEPENDENT_AMBULATORY_CARE_PROVIDER_SITE_OTHER): Payer: Medicare HMO

## 2021-10-20 VITALS — BP 120/78 | HR 78 | Temp 97.3°F | Resp 16 | Ht 60.0 in | Wt 156.6 lb

## 2021-10-20 VITALS — BP 120/78 | HR 78 | Temp 97.3°F | Ht 60.0 in | Wt 156.6 lb

## 2021-10-20 DIAGNOSIS — E118 Type 2 diabetes mellitus with unspecified complications: Secondary | ICD-10-CM | POA: Diagnosis not present

## 2021-10-20 DIAGNOSIS — I10 Essential (primary) hypertension: Secondary | ICD-10-CM | POA: Diagnosis not present

## 2021-10-20 DIAGNOSIS — D500 Iron deficiency anemia secondary to blood loss (chronic): Secondary | ICD-10-CM

## 2021-10-20 DIAGNOSIS — Z0000 Encounter for general adult medical examination without abnormal findings: Secondary | ICD-10-CM

## 2021-10-20 DIAGNOSIS — Z0001 Encounter for general adult medical examination with abnormal findings: Secondary | ICD-10-CM

## 2021-10-20 LAB — CBC WITH DIFFERENTIAL/PLATELET
Basophils Absolute: 0 10*3/uL (ref 0.0–0.1)
Basophils Relative: 0.5 % (ref 0.0–3.0)
Eosinophils Absolute: 0.1 10*3/uL (ref 0.0–0.7)
Eosinophils Relative: 1.2 % (ref 0.0–5.0)
HCT: 37.3 % (ref 36.0–46.0)
Hemoglobin: 12.2 g/dL (ref 12.0–15.0)
Lymphocytes Relative: 10.6 % — ABNORMAL LOW (ref 12.0–46.0)
Lymphs Abs: 0.9 10*3/uL (ref 0.7–4.0)
MCHC: 32.6 g/dL (ref 30.0–36.0)
MCV: 93.3 fl (ref 78.0–100.0)
Monocytes Absolute: 0.6 10*3/uL (ref 0.1–1.0)
Monocytes Relative: 6.8 % (ref 3.0–12.0)
Neutro Abs: 6.8 10*3/uL (ref 1.4–7.7)
Neutrophils Relative %: 80.9 % — ABNORMAL HIGH (ref 43.0–77.0)
Platelets: 227 10*3/uL (ref 150.0–400.0)
RBC: 4 Mil/uL (ref 3.87–5.11)
RDW: 13.9 % (ref 11.5–15.5)
WBC: 8.4 10*3/uL (ref 4.0–10.5)

## 2021-10-20 LAB — URINALYSIS, ROUTINE W REFLEX MICROSCOPIC
Hgb urine dipstick: NEGATIVE
Leukocytes,Ua: NEGATIVE
Nitrite: NEGATIVE
RBC / HPF: NONE SEEN (ref 0–?)
Specific Gravity, Urine: 1.025 (ref 1.000–1.030)
Total Protein, Urine: 30 — AB
Urine Glucose: NEGATIVE
Urobilinogen, UA: 1 (ref 0.0–1.0)
pH: 6 (ref 5.0–8.0)

## 2021-10-20 LAB — BASIC METABOLIC PANEL
BUN: 16 mg/dL (ref 6–23)
CO2: 29 mEq/L (ref 19–32)
Calcium: 9.7 mg/dL (ref 8.4–10.5)
Chloride: 104 mEq/L (ref 96–112)
Creatinine, Ser: 0.68 mg/dL (ref 0.40–1.20)
GFR: 88.85 mL/min (ref >60.00)
Glucose, Bld: 86 mg/dL (ref 70–99)
Potassium: 3.8 mEq/L (ref 3.5–5.1)
Sodium: 140 mEq/L (ref 135–145)

## 2021-10-20 LAB — IBC + FERRITIN
Ferritin: 272.8 ng/mL (ref 10.0–291.0)
Iron: 67 ug/dL (ref 42–145)
Saturation Ratios: 24 % (ref 20.0–50.0)
TIBC: 278.6 ug/dL (ref 250.0–450.0)
Transferrin: 199 mg/dL — ABNORMAL LOW (ref 212.0–360.0)

## 2021-10-20 LAB — MICROALBUMIN / CREATININE URINE RATIO
Creatinine,U: 197 mg/dL
Microalb Creat Ratio: 5.6 mg/g (ref 0.0–30.0)
Microalb, Ur: 11 mg/dL — ABNORMAL HIGH (ref 0.0–1.9)

## 2021-10-20 LAB — TSH: TSH: 1.42 u[IU]/mL (ref 0.35–5.50)

## 2021-10-20 LAB — HM MAMMOGRAPHY: HM Mammogram: NORMAL (ref 0–4)

## 2021-10-20 NOTE — Progress Notes (Unsigned)
Subjective:  Patient ID: Tonya [name], female    DOB: 04-19-1950  Age: 71 y.o. MRN: [id number]  CC: Annual Exam, Anemia, Hypertension, and Hyperlipidemia   HPI Tonya [name] presents for a CPX and f/up -   She has intentionally lost about 50 pounds.  She is active and denies chest pain, shortness of breath, diaphoresis, or edema.   Outpatient Medications Prior to Visit  Medication Sig Dispense Refill   acetaminophen (TYLENOL) 500 MG tablet Take 1,000 mg by mouth every 8 (eight) hours as needed for moderate pain. (Patient not taking: Reported on 10/20/2021)     aspirin 81 MG chewable tablet Chew 1 tablet (81 mg total) by mouth daily. 90 tablet 1   blood glucose meter kit and supplies KIT Dispense based on patient and insurance preference. Use up to four times daily as directed. (Patient not taking: Reported on 10/20/2021) 1 each 0   cholecalciferol (VITAMIN D) 25 MCG (1000 UNIT) tablet Take 1,000 Units by mouth daily.     ferrous sulfate 325 (65 FE) MG tablet Take 324 mg by mouth daily with breakfast.     Insulin Pen Needle 32G X 6 MM MISC 1 Act by Does not apply route once a week. (Patient not taking: Reported on 10/20/2021) 50 each 0   Lancets (ONETOUCH DELICA PLUS [id number]) MISC SMARTSIG:Via Meter 1-4 Times Daily (Patient not taking: Reported on 10/20/2021)     metoprolol tartrate (LOPRESSOR) 25 MG tablet TAKE 1 TABLET BY MOUTH 2 TIMES DAILY (Patient taking differently: Take 25 mg by mouth daily at 12 noon.) 60 tablet 1   Multiple Vitamin (MULTIVITAMIN) tablet Take 1 tablet by mouth daily.     olmesartan (BENICAR) 20 MG tablet TAKE 1 TABLET BY MOUTH EVERY DAY 100 tablet 0   ONETOUCH ULTRA test strip SMARTSIG:Via Meter 1-4 Times Daily     rosuvastatin (CRESTOR) 20 MG tablet TAKE 1 TABLET BY MOUTH EVERY DAY 100 tablet 0   Semaglutide, 1 MG/DOSE, 4 MG/3ML SOPN Inject 1 mg into the skin once a week. (Patient not taking: Reported on 10/20/2021) 3 mL 0   OZEMPIC, 2 MG/DOSE, 8  MG/3ML SOPN INJECT $RemoveBef'2MG'SDCffbFlcu$  SUBCUTANEOUSLY ONCE A WEEK AS DIRECTED 3 mL 0   No facility-administered medications prior to visit.    ROS Review of Systems  Constitutional:  Negative for chills, diaphoresis, fatigue and fever.  HENT: Negative.    Eyes: Negative.   Respiratory:  Negative for cough, chest tightness, shortness of breath and wheezing.   Cardiovascular:  Negative for chest pain, palpitations and leg swelling.  Gastrointestinal:  Negative for abdominal pain, diarrhea and nausea.  Endocrine: Negative.   Genitourinary: Negative.  Negative for difficulty urinating.  Musculoskeletal: Negative.  Negative for arthralgias, back pain and myalgias.  Skin: Negative.   Neurological:  Negative for dizziness, weakness and light-headedness.  Hematological:  Negative for adenopathy. Does not bruise/bleed easily.  Psychiatric/Behavioral: Negative.      Objective:  BP 120/78   Pulse 78   Temp (!) 97.3 F (36.3 C) (Oral)   Ht 5' (1.524 m)   Wt 156 lb 9.6 oz (71 kg)   SpO2 99%   BMI 30.58 kg/m   BP Readings from Last 3 Encounters:  10/20/21 120/78  10/20/21 120/78  10/06/21 120/65    Wt Readings from Last 3 Encounters:  10/20/21 156 lb 9.6 oz (71 kg)  10/20/21 156 lb 9.6 oz (71 kg)  10/06/21 160 lb 6.4 oz (72.8 kg)    Physical  Exam Vitals reviewed.  HENT:     Nose: Nose normal.     Mouth/Throat:     Mouth: Mucous membranes are moist.  Eyes:     General: No scleral icterus.    Conjunctiva/sclera: Conjunctivae normal.  Cardiovascular:     Rate and Rhythm: Normal rate and regular rhythm.     Heart sounds: No murmur heard. Pulmonary:     Effort: Pulmonary effort is normal.     Breath sounds: No stridor. No wheezing, rhonchi or rales.  Abdominal:     General: Abdomen is flat.     Palpations: There is no mass.     Tenderness: There is no abdominal tenderness. There is no guarding.     Hernia: No hernia is present.  Musculoskeletal:        General: Normal range of motion.      Cervical back: Neck supple.     Right lower leg: No edema.     Left lower leg: No edema.  Lymphadenopathy:     Cervical: No cervical adenopathy.  Skin:    General: Skin is warm and dry.  Neurological:     General: No focal deficit present.     Mental Status: She is alert.  Psychiatric:        Mood and Affect: Mood normal.        Behavior: Behavior normal.     Lab Results  Component Value Date   WBC 8.4 10/20/2021   HGB 12.2 10/20/2021   HCT 37.3 10/20/2021   PLT 227.0 10/20/2021   GLUCOSE 86 10/20/2021   CHOL 130 03/30/2021   TRIG 106 03/30/2021   HDL 60 03/30/2021   LDLCALC 51 03/30/2021   ALT 26 02/20/2021   AST 24 02/20/2021   NA 140 10/20/2021   K 3.8 10/20/2021   CL 104 10/20/2021   CREATININE 0.68 10/20/2021   BUN 16 10/20/2021   CO2 29 10/20/2021   TSH 1.42 10/20/2021   INR 1.0 08/02/2019   HGBA1C 5.6 06/19/2021   MICROALBUR 11.0 (H) 10/20/2021    CARDIAC CATHETERIZATION  Result Date: 08/26/2020 Left heart catheterization 08/26/2020: LV: 112/-1, EDP 1 mmHg.  Ao 108/47, mean 70 mmHg.  There was no pressure gradient across the aortic valve. Angiographic data: LV: Normal LV systolic function.  No wall motion abnormality.  LVEF 55 to 60%. RCA: Very large caliber vessel with mild luminal irregularity.  Dominant.  Gives origin to large PDA and a large PL branch.  There is collaterals noted from the right coronary artery to the distal LAD. LM: Large vessel, smooth. Circumflex: Large caliber vessel, moderate-sized OM1, mild disease. LAD: Large vessel proximally.  There is a 90 to 95% stenosis in the proximal LAD followed by origin of a very large D1 which is LAD equivalent.  Just after D1, there is a large septal perforator to and at the site of SP2, the LAD is chronically occluded.  No significant collaterals were evident. Intervention data: Successful PTCA and stenting of the proximal and mid LAD, mid LAD CTO with implantation of 3 overlapping stents from proximal to  distal 3.0 x 18 mm Onyx 1.,  Mid segment with a 2.5 x 30 mm Onyx frontier and the edge dissection of the stent covered with a 2.0 x 12 mm Onyx frontier DES, stenosis reduced from 100% to 0% with TIMI 0 to TIMI-3 flow at the end of the procedure. 220 mm contrast utilized.  Patient to be discharged home today with outpatient follow-up, she  will need aggressive risk factor modification, started on metoprolol 25 mg p.o. twice daily, Plavix 75 mg daily which she will need for 6 months along with aspirin 81 mg indefinitely.    Assessment & Plan:   Tonya [name] was seen today for annual exam, anemia, hypertension and hyperlipidemia.  Diagnoses and all orders for this visit:  Primary hypertension- Her blood pressure is well controlled. -     Basic metabolic panel; Future -     CBC with Differential/Platelet; Future -     TSH; Future -     Urinalysis, Routine w reflex microscopic; Future -     Urinalysis, Routine w reflex microscopic -     TSH -     CBC with Differential/Platelet -     Basic metabolic panel  Iron deficiency anemia due to chronic blood loss- Her H&H and iron levels are normal now. -     IBC + Ferritin; Future -     CBC with Differential/Platelet; Future -     CBC with Differential/Platelet -     IBC + Ferritin  Type II diabetes mellitus with manifestations ([place])- Her blood sugar is well controlled. -     Basic metabolic panel; Future -     Urinalysis, Routine w reflex microscopic; Future -     Microalbumin / creatinine urine ratio; Future -     HM Diabetes Foot Exam -     Microalbumin / creatinine urine ratio -     Urinalysis, Routine w reflex microscopic -     Basic metabolic panel  Encounter for general adult medical examination with abnormal findings- Exam completed, labs reviewed, vaccines are up-to-date, she defers on all cancer screenings, patient education was given.   I have discontinued Arbie Cookey A. Gragert's Ozempic (2 MG/DOSE). I am also having her maintain her  multivitamin, cholecalciferol, acetaminophen, aspirin, ferrous sulfate, Insulin Pen Needle, blood glucose meter kit and supplies, OneTouch Ultra, OneTouch Delica Plus [id number], olmesartan, metoprolol tartrate, Semaglutide (1 MG/DOSE), and rosuvastatin.  No orders of the defined types were placed in this encounter.    Follow-up: Return in about 3 months (around 01/20/2022).  Scarlette Calico, MD

## 2021-10-20 NOTE — Progress Notes (Signed)
Subjective:   Tonya [name] is a 71 y.o. female who presents for Medicare Annual (Subsequent) preventive examination.  Review of Systems     Cardiac Risk Factors include: advanced age (>48mn, >>80women);dyslipidemia;hypertension;obesity (BMI >30kg/m2);family history of premature cardiovascular disease     Objective:    Today's Vitals   10/20/21 0900  BP: 120/78  Pulse: 78  Resp: 16  Temp: (!) 97.3 F (36.3 C)  TempSrc: Temporal  SpO2: 99%  Weight: 156 lb 9.6 oz (71 kg)  Height: 5' (1.524 m)  PainSc: 0-No pain   Body mass index is 30.58 kg/m.     10/20/2021    9:08 AM 09/26/2020    5:16 PM 08/26/2020    6:04 AM 08/02/2019    8:36 AM 07/14/2019    9:09 AM 04/05/2019   11:29 AM 01/19/2019    5:53 AM  Advanced Directives  Does Patient Have a Medical Advance Directive? Yes Yes Yes No No Yes Yes  Type of AParamedicof AMinocquaLiving will;Out of facility DNR (pink MOST or yellow form) Healthcare Power of AHodgkinsLiving will    HUkiahLiving will  Does patient want to make changes to medical advance directive? No - Patient declined  No - Patient declined      Copy of HHuguleyin Chart? Yes - validated most recent copy scanned in chart (See row information) Yes - validated most recent copy scanned in chart (See row information)       Would patient like information on creating a medical advance directive?    No - Patient declined       Current Medications (verified) Outpatient Encounter Medications as of 10/20/2021  Medication Sig   aspirin 81 MG chewable tablet Chew 1 tablet (81 mg total) by mouth daily.   cholecalciferol (VITAMIN D) 25 MCG (1000 UNIT) tablet Take 1,000 Units by mouth daily.   ferrous sulfate 325 (65 FE) MG tablet Take 324 mg by mouth daily with breakfast.   metoprolol tartrate (LOPRESSOR) 25 MG tablet TAKE 1 TABLET BY MOUTH 2 TIMES DAILY (Patient taking  differently: Take 25 mg by mouth daily at 12 noon.)   Multiple Vitamin (MULTIVITAMIN) tablet Take 1 tablet by mouth daily.   olmesartan (BENICAR) 20 MG tablet TAKE 1 TABLET BY MOUTH EVERY DAY   OZEMPIC, 2 MG/DOSE, 8 MG/3ML SOPN INJECT 2MG SUBCUTANEOUSLY ONCE A WEEK AS DIRECTED   rosuvastatin (CRESTOR) 20 MG tablet TAKE 1 TABLET BY MOUTH EVERY DAY   acetaminophen (TYLENOL) 500 MG tablet Take 1,000 mg by mouth every 8 (eight) hours as needed for moderate pain. (Patient not taking: Reported on 10/20/2021)   blood glucose meter kit and supplies KIT Dispense based on patient and insurance preference. Use up to four times daily as directed. (Patient not taking: Reported on 10/20/2021)   Insulin Pen Needle 32G X 6 MM MISC 1 Act by Does not apply route once a week. (Patient not taking: Reported on 10/20/2021)   Lancets (ONETOUCH DELICA PLUS [id number] MISC SMARTSIG:Via Meter 1-4 Times Daily (Patient not taking: Reported on 10/20/2021)   ONETOUCH ULTRA test strip SMARTSIG:Via Meter 1-4 Times Daily   Semaglutide, 1 MG/DOSE, 4 MG/3ML SOPN Inject 1 mg into the skin once a week. (Patient not taking: Reported on 10/20/2021)   No facility-administered encounter medications on file as of 10/20/2021.    Allergies (verified) Bactrim [sulfamethoxazole-trimethoprim], Phenergan [promethazine hcl], Sulfa antibiotics, and Penicillins   History: Past  Medical History:  Diagnosis Date   Anemia    few yrs ago   C. difficile diarrhea 03/2018   Cancer ([place])    rectal   CHF (congestive heart failure) ([place])    Coronary artery disease 07/2003   occluded LAD wtih right-to-left collaterals 07/24/03; no current cardiologist   Diabetes mellitus without complication ([place])    Elbow fracture, left    History of chemotherapy 01/2018   radaition last tx jan 2019   Myocardial infarction [facility]) age 54's   Obesity 2003   s/p gastric bypass    Primary hypertension 07/30/2020   Vitamin D deficiency    Wears glasses    Past  Surgical History:  Procedure Laterality Date   ANAL RECTAL MANOMETRY N/A 11/01/2018   Procedure: ANO RECTAL MANOMETRY;  Surgeon: Leighton Ruff, MD;  Location: [facility];  Service: Endoscopy;  Laterality: N/A;   CHOLECYSTECTOMY  1999   CORONARY CTO INTERVENTION N/A 08/26/2020   Procedure: CORONARY CTO INTERVENTION;  Surgeon: Adrian Prows, MD;  Location: [place] [facility];  Service: Cardiovascular;  Laterality: N/A;   CORONARY STENT INTERVENTION N/A 08/26/2020   Procedure: CORONARY STENT INTERVENTION;  Surgeon: Adrian Prows, MD;  Location: [place] [facility];  Service: Cardiovascular;  Laterality: N/A;   GASTRIC BYPASS  2003   IR FLUORO GUIDE PORT INSERTION RIGHT  01/25/2017   IR REMOVAL TUN ACCESS W/ PORT W/O FL MOD SED  10/17/2018   IR US GUIDE VASC ACCESS RIGHT  01/25/2017   LEFT HEART CATH AND CORONARY ANGIOGRAPHY N/A 08/26/2020   Procedure: LEFT HEART CATH AND CORONARY ANGIOGRAPHY;  Surgeon: Adrian Prows, MD;  Location: [place] [facility];  Service: Cardiovascular;  Laterality: N/A;   ORIF HUMERUS FRACTURE Left 08/02/2019   Procedure: OPEN REDUCTION INTERNAL FIXATION (ORIF) LEFT ELBOW FRACTURE.;  Surgeon: Altamese Freedom, MD;  Location: [facility];  Service: Orthopedics;  Laterality: Left;   ostomy placed and removed  12/2017   SACRAL NERVE STIMULATOR PLACEMENT  01-12-2019   DR Marcello Moores _0    TEST PHASE   Family History  Problem Relation Age of Onset   Heart attack Mother        d.93   Throat cancer Mother 4   Clotting disorder Mother    Liver disease Mother    Kidney disease Mother    Heart attack Father        d.92   Prostate cancer Father 36   Uterine cancer Sister 53       treated with total hysterectomy and radiation   Thyroid cancer Sister 86       treated with thyroidectomy   Uterine cancer Sister 29   Cancer Maternal Uncle        d.80s unspecified type of cancer. History of smoking.   Colon cancer Cousin 73       paternal first-cousin   Social History   Socioeconomic  History   Marital status: Single    Spouse name: Not on file   Number of children: 0   Years of education: Not on file   Highest education level: Not on file  Occupational History   Occupation: retired  Tobacco Use   Smoking status: Never   Smokeless tobacco: Never  Vaping Use   Vaping Use: Never used  Substance and Sexual Activity   Alcohol use: No   Drug use: No   Sexual activity: Not on file  Other Topics Concern   Not on file  Social History Narrative   Not  on file   Social Determinants of Health   Financial Resource Strain: Low Risk  (10/20/2021)   Overall Financial Resource Strain (CARDIA)    Difficulty of Paying Living Expenses: Not hard at all  Food Insecurity: No Food Insecurity (10/20/2021)   Hunger Vital Sign    Worried About Running Out of Food in the Last Year: Never true    Ran Out of Food in the Last Year: Never true  Transportation Needs: No Transportation Needs (10/20/2021)   PRAPARE - Hydrologist (Medical): No    Lack of Transportation (Non-Medical): No  Physical Activity: Sufficiently Active (10/20/2021)   Exercise Vital Sign    Days of Exercise per Week: 5 days    Minutes of Exercise per Session: 30 min  Stress: No Stress Concern Present (10/20/2021)   [place]    Feeling of Stress : Not at all  Social Connections: Socially Isolated (10/20/2021)   Social Connection and Isolation Panel [NHANES]    Frequency of Communication with Friends and Family: More than three times a week    Frequency of Social Gatherings with Friends and Family: Once a week    Attends Religious Services: Never    Marine scientist or Organizations: No    Attends Music therapist: Never    Marital Status: Never married    Tobacco Counseling Counseling given: Not Answered   Clinical Intake:  Pre-visit preparation completed: Yes  Pain : No/denies  pain Pain Score: 0-No pain     BMI - recorded: 30.58 Nutritional Status: BMI > 30  Obese Nutritional Risks: None Diabetes: No  How often do you need to have someone help you when you read instructions, pamphlets, or other written materials from your doctor or pharmacy?: 1 - Never What is the last grade level you completed in school?: HSG  Diabetic? no  Interpreter Needed?: No  Information entered by :: Lisette Abu, LPN.   Activities of Daily Living    10/20/2021    9:08 AM  In your present state of health, do you have any difficulty performing the following activities:  Hearing? 0  Vision? 0  Difficulty concentrating or making decisions? 0  Walking or climbing stairs? 0  Dressing or bathing? 0  Doing errands, shopping? 0  Preparing Food and eating ? N  Using the Toilet? N  In the past six months, have you accidently leaked urine? Y  Do you have problems with loss of bowel control? Y  Managing your Medications? N  Managing your Finances? N  Housekeeping or managing your Housekeeping? N    Patient Care Team: [name], MD as PCP - General (Internal Medicine) Irene Shipper, MD as Consulting Physician (Gastroenterology) Leighton Ruff, MD as Consulting Physician (General Surgery) Truitt Merle, MD as Consulting Physician (Hematology) Kyung Rudd, MD as Consulting Physician (Radiation Oncology)  Indicate any recent Medical Services you may have received from other than [facility] providers in the past year (date may be approximate).     Assessment:   This is a routine wellness examination for Laisha.  Hearing/Vision screen Hearing Screening - Comments:: Denies hearing difficulties   Vision Screening - Comments:: Wears rx glasses - up to date with routine eye exams with    Dietary issues and exercise activities discussed: Current Exercise Habits: Home exercise routine, Type of exercise: walking, Time (Minutes): 30, Frequency (Times/Week): 5, Weekly Exercise  (Minutes/Week): 150, Intensity: Moderate, Exercise  limited by: cardiac condition(s)   Goals Addressed             This Visit's Progress    Client understands the importance of follow-up with providers by attending scheduled visits.        Depression Screen    10/20/2021    9:07 AM 06/19/2021   10:09 AM 02/20/2021    9:47 AM 09/26/2020    5:12 PM 07/30/2020    1:17 PM 05/28/2020    9:28 AM 05/28/2020    9:27 AM  PHQ 2/9 Scores  PHQ - 2 Score 0 0 0 0 0 0 0  PHQ- 9 Score  0 0   1     Fall Risk    10/20/2021    9:08 AM 06/19/2021   10:09 AM 09/26/2020    5:12 PM 07/30/2020    1:17 PM 05/28/2020    9:27 AM  Fall Risk   Falls in the past year? 0 0 0 1 1  Number falls in past yr: 0 0 0 0 1  Injury with Fall? 0 0 0 1 1  Risk for fall due to : No Fall Risks    Impaired balance/gait;History of fall(s)  Follow up Falls prevention discussed    Falls evaluation completed    FALL RISK PREVENTION PERTAINING TO THE HOME:  Any stairs in or around the home? Yes  If so, are there any without handrails? No  Home free of loose throw rugs in walkways, pet beds, electrical cords, etc? Yes  Adequate lighting in your home to reduce risk of falls? Yes   ASSISTIVE DEVICES UTILIZED TO PREVENT FALLS:  Life alert? No  Use of a cane, walker or w/c? No  Grab bars in the bathroom? Yes  Shower chair or bench in shower? Yes  Elevated toilet seat or a handicapped toilet? Yes   TIMED UP AND GO:  Was the test performed? Yes .  Length of time to ambulate 10 feet: 6 sec.   Gait steady and fast without use of assistive device  Cognitive Function:        10/20/2021    9:08 AM 09/26/2020    5:16 PM  6CIT Screen  What Year? 0 points 0 points  What month? 0 points 0 points  What time? 0 points 0 points  Count back from 20 0 points 0 points  Months in reverse 0 points 0 points  Repeat phrase 0 points 0 points  Total Score 0 points 0 points    Immunizations Immunization History  Administered  Date(s) Administered   Fluad Quad(high Dose 65+) 10/02/2018   Influenza Inj Mdck Quad With Preservative 01/21/2017   Influenza-Unspecified 09/10/2021   PFIZER Comirnaty(Gray Top)Covid-19 Tri-Sucrose Vaccine 02/08/2019, 03/05/2019, 09/30/2019, 05/17/2020   PFIZER(Purple Top)SARS-COV-2 Vaccination 02/08/2019, 03/05/2019, 09/30/2019, 05/17/2020, 10/25/2020   PNEUMOCOCCAL CONJUGATE-20 06/19/2021   Pneumococcal Polysaccharide-23 06/16/2017, 09/05/2019   Tdap 07/14/2019   Zoster Recombinat (Shingrix) 06/26/2021, 08/27/2021    TDAP status: Up to date  Flu Vaccine status: Up to date  Pneumococcal vaccine status: Up to date  Covid-19 vaccine status: Completed vaccines  Qualifies for Shingles Vaccine? Yes   Zostavax completed No   Shingrix Completed?: Yes  Screening Tests Health Maintenance  Topic Date Due   FOOT EXAM  Never done   OPHTHALMOLOGY EXAM  Never done   Diabetic kidney evaluation - Urine ACR  Never done   Hepatitis C Screening  Never done   COLONOSCOPY (Pts 45-65yr Insurance coverage will need to  be confirmed)  06/28/2019   COVID-19 Vaccine (10 - Pfizer risk series) 12/20/2020   HEMOGLOBIN A1C  12/19/2021   MAMMOGRAM  03/07/2022   Diabetic kidney evaluation - GFR measurement  06/20/2022   TETANUS/TDAP  07/13/2029   Pneumonia Vaccine 43+ Years old  Completed   INFLUENZA VACCINE  Completed   DEXA SCAN  Completed   Zoster Vaccines- Shingrix  Completed   HPV VACCINES  Aged Out    Health Maintenance  Health Maintenance Due  Topic Date Due   FOOT EXAM  Never done   OPHTHALMOLOGY EXAM  Never done   Diabetic kidney evaluation - Urine ACR  Never done   Hepatitis C Screening  Never done   COLONOSCOPY (Pts 45-9yr Insurance coverage will need to be confirmed)  06/28/2019   COVID-19 Vaccine (10 - Pfizer risk series) 12/20/2020    Colorectal cancer screening: No longer required.  Patient declined.  Mammogram status: No longer required due to patient declined.  Bone  Density status: patient declined.  Lung Cancer Screening: (Low Dose CT Chest recommended if Age 69-80years, 30 pack-year currently smoking OR have quit w/in 15years.) does not qualify.   Lung Cancer Screening Referral: no  Additional Screening:  Hepatitis C Screening: does qualify; Completed no  Vision Screening: Recommended annual ophthalmology exams for early detection of glaucoma and other disorders of the eye. Is the patient up to date with their annual eye exam?  Yes  Who is the provider or what is the name of the office in which the patient attends annual eye exams? [facility] If pt is not established with a provider, would they like to be referred to a provider to establish care? Yes .   Dental Screening: Recommended annual dental exams for proper oral hygiene  Community Resource Referral / Chronic Care Management: CRR required this visit?  No   CCM required this visit?  No      Plan:     I have personally reviewed and noted the following in the patient's chart:   Medical and social history Use of alcohol, tobacco or illicit drugs  Current medications and supplements including opioid prescriptions. Patient is not currently taking opioid prescriptions. Functional ability and status Nutritional status Physical activity Advanced directives List of other physicians Hospitalizations, surgeries, and ER visits in previous 12 months Vitals Screenings to include cognitive, depression, and falls Referrals and appointments  In addition, I have reviewed and discussed with patient certain preventive protocols, quality metrics, and best practice recommendations. A written personalized care plan for preventive services as well as general preventive health recommendations were provided to patient.     SSheral Flow LPN   [id number]  Nurse Notes: N/A

## 2021-10-20 NOTE — Patient Instructions (Signed)
Tonya [name] , Thank you for taking time to come for your Medicare Wellness Visit. I appreciate your ongoing commitment to your health goals. Please review the following plan we discussed and let me know if I can assist you in the future.   These are the goals we discussed:  Goals   None     This is a list of the screening recommended for you and due dates:  Health Maintenance  Topic Date Due   Complete foot exam   Never done   Eye exam for diabetics  Never done   Yearly kidney health urinalysis for diabetes  Never done   Hepatitis C Screening: USPSTF Recommendation to screen - Ages 69-79 yo.  Never done   Zoster (Shingles) Vaccine (1 of 2) Never done   Colon Cancer Screening  06/28/2019   COVID-19 Vaccine (9 - Pfizer risk series) 07/12/2020   Flu Shot  08/04/2021   Hemoglobin A1C  12/19/2021   Mammogram  03/07/2022   Yearly kidney function blood test for diabetes  06/20/2022   Tetanus Vaccine  07/13/2029   Pneumonia Vaccine  Completed   DEXA scan (bone density measurement)  Completed   HPV Vaccine  Aged Out    Advanced directives: Yes; documents are on file.  Conditions/risks identified: Yes  Next appointment: Follow up in one year for your annual wellness visit.   Preventive Care 69 Years and Older, Female Preventive care refers to lifestyle choices and visits with your health care provider that can promote health and wellness. What does preventive care include? A yearly physical exam. This is also called an annual well check. Dental exams once or twice a year. Routine eye exams. Ask your health care provider how often you should have your eyes checked. Personal lifestyle choices, including: Daily care of your teeth and gums. Regular physical activity. Eating a healthy diet. Avoiding tobacco and drug use. Limiting alcohol use. Practicing safe sex. Taking low-dose aspirin every day. Taking vitamin and mineral supplements as recommended by your health care  provider. What happens during an annual well check? The services and screenings done by your health care provider during your annual well check will depend on your age, overall health, lifestyle risk factors, and family history of disease. Counseling  Your health care provider may ask you questions about your: Alcohol use. Tobacco use. Drug use. Emotional well-being. Home and relationship well-being. Sexual activity. Eating habits. History of falls. Memory and ability to understand (cognition). Work and work Statistician. Reproductive health. Screening  You may have the following tests or measurements: Height, weight, and BMI. Blood pressure. Lipid and cholesterol levels. These may be checked every 5 years, or more frequently if you are over 33 years old. Skin check. Lung cancer screening. You may have this screening every year starting at age 69 if you have a 30-pack-year history of smoking and currently smoke or have quit within the past 15 years. Fecal occult blood test (FOBT) of the stool. You may have this test every year starting at age 69. Flexible sigmoidoscopy or colonoscopy. You may have a sigmoidoscopy every 5 years or a colonoscopy every 10 years starting at age 69. Hepatitis C blood test. Hepatitis B blood test. Sexually transmitted disease (STD) testing. Diabetes screening. This is done by checking your blood sugar (glucose) after you have not eaten for a while (fasting). You may have this done every 1-3 years. Bone density scan. This is done to screen for osteoporosis. You may have this done starting  at age 69. Mammogram. This may be done every 1-2 years. Talk to your health care provider about how often you should have regular mammograms. Talk with your health care provider about your test results, treatment options, and if necessary, the need for more tests. Vaccines  Your health care provider may recommend certain vaccines, such as: Influenza vaccine. This is  recommended every year. Tetanus, diphtheria, and acellular pertussis (Tdap, Td) vaccine. You may need a Td booster every 10 years. Zoster vaccine. You may need this after age 69. Pneumococcal 13-valent conjugate (PCV13) vaccine. One dose is recommended after age 69. Pneumococcal polysaccharide (PPSV23) vaccine. One dose is recommended after age 69. Talk to your health care provider about which screenings and vaccines you need and how often you need them. This information is not intended to replace advice given to you by your health care provider. Make sure you discuss any questions you have with your health care provider. Document Released: 01/17/2015 Document Revised: 09/10/2015 Document Reviewed: 10/22/2014 Elsevier Interactive Patient Education  2017 [place] Prevention in the Home Falls can cause injuries. They can happen to people of all ages. There are many things you can do to make your home safe and to help prevent falls. What can I do on the outside of my home? Regularly fix the edges of walkways and driveways and fix any cracks. Remove anything that might make you trip as you walk through a door, such as a raised step or threshold. Trim any bushes or trees on the path to your home. Use bright outdoor lighting. Clear any walking paths of anything that might make someone trip, such as rocks or tools. Regularly check to see if handrails are loose or broken. Make sure that both sides of any steps have handrails. Any raised decks and porches should have guardrails on the edges. Have any leaves, snow, or ice cleared regularly. Use sand or salt on walking paths during winter. Clean up any spills in your garage right away. This includes oil or grease spills. What can I do in the bathroom? Use night lights. Install grab bars by the toilet and in the tub and shower. Do not use towel bars as grab bars. Use non-skid mats or decals in the tub or shower. If you need to sit down in  the shower, use a plastic, non-slip stool. Keep the floor dry. Clean up any water that spills on the floor as soon as it happens. Remove soap buildup in the tub or shower regularly. Attach bath mats securely with double-sided non-slip rug tape. Do not have throw rugs and other things on the floor that can make you trip. What can I do in the bedroom? Use night lights. Make sure that you have a light by your bed that is easy to reach. Do not use any sheets or blankets that are too big for your bed. They should not hang down onto the floor. Have a firm chair that has side arms. You can use this for support while you get dressed. Do not have throw rugs and other things on the floor that can make you trip. What can I do in the kitchen? Clean up any spills right away. Avoid walking on wet floors. Keep items that you use a lot in easy-to-reach places. If you need to reach something above you, use a strong step stool that has a grab bar. Keep electrical cords out of the way. Do not use floor polish or wax that makes  floors slippery. If you must use wax, use non-skid floor wax. Do not have throw rugs and other things on the floor that can make you trip. What can I do with my stairs? Do not leave any items on the stairs. Make sure that there are handrails on both sides of the stairs and use them. Fix handrails that are broken or loose. Make sure that handrails are as long as the stairways. Check any carpeting to make sure that it is firmly attached to the stairs. Fix any carpet that is loose or worn. Avoid having throw rugs at the top or bottom of the stairs. If you do have throw rugs, attach them to the floor with carpet tape. Make sure that you have a light switch at the top of the stairs and the bottom of the stairs. If you do not have them, ask someone to add them for you. What else can I do to help prevent falls? Wear shoes that: Do not have high heels. Have rubber bottoms. Are comfortable  and fit you well. Are closed at the toe. Do not wear sandals. If you use a stepladder: Make sure that it is fully opened. Do not climb a closed stepladder. Make sure that both sides of the stepladder are locked into place. Ask someone to hold it for you, if possible. Clearly mark and make sure that you can see: Any grab bars or handrails. First and last steps. Where the edge of each step is. Use tools that help you move around (mobility aids) if they are needed. These include: Canes. Walkers. Scooters. Crutches. Turn on the lights when you go into a dark area. Replace any light bulbs as soon as they burn out. Set up your furniture so you have a clear path. Avoid moving your furniture around. If any of your floors are uneven, fix them. If there are any pets around you, be aware of where they are. Review your medicines with your doctor. Some medicines can make you feel dizzy. This can increase your chance of falling. Ask your doctor what other things that you can do to help prevent falls. This information is not intended to replace advice given to you by your health care provider. Make sure you discuss any questions you have with your health care provider. Document Released: 10/17/2008 Document Revised: 05/29/2015 Document Reviewed: 01/25/2014 Elsevier Interactive Patient Education  2017 [employer].

## 2021-10-20 NOTE — Patient Instructions (Signed)
Iron Deficiency Anemia, Adult  Iron deficiency anemia is a condition in which the concentration of red blood cells or hemoglobin in the blood is below normal because of too little iron. Hemoglobin is a substance in red blood cells that carries oxygen to the body's tissues. When the concentration of red blood cells or hemoglobin is too low, not enough oxygen reaches these tissues. Iron deficiency anemia is usually long-lasting, and it develops over time. It may or may not cause symptoms. It is a common type of anemia. What are the causes? This condition may be caused by: Not enough iron in the diet. Abnormal absorption in the gut. Blood loss. What increases the risk? You are more likely to develop this condition if you get menstrual periods (menstruate) or are pregnant. What are the signs or symptoms? Symptoms of this condition may include: Pale skin, lips, and nail beds. Weakness, dizziness, and getting tired easily. Shortness of breath when moving or exercising. Cold hands or feet. Mild anemia may not cause any symptoms. How is this diagnosed? This condition is diagnosed based on: Your medical history. A physical exam. Blood tests. How is this treated? This condition is treated by correcting the cause of your iron deficiency. Treatment may involve: Adding iron-rich foods to your diet. Taking iron supplements. If you are pregnant or breastfeeding, you may need to take extra iron because your normal diet usually does not provide the amount of iron that you need. Increasing vitamin C intake. Vitamin C helps your body absorb iron. Your health care provider may recommend that you take iron supplements along with a glass of orange juice or a vitamin C supplement. Medicines to make heavy menstrual flow lighter. Surgery or additional testing procedures to determine the cause of your anemia. You may need repeat blood tests to determine whether treatment is working. If the treatment does not  seem to be working, you may need more tests. Follow these instructions at home: Medicines Take over-the-counter and prescription medicines only as told by your health care provider. This includes iron supplements and vitamins. This is important because too much iron can be harmful. For the best iron absorption, you should take iron supplements when your stomach is empty. If you cannot tolerate them on an empty stomach, you may need to take them with food. Do not drink milk or take antacids at the same time as your iron supplements. Milk and antacids may interfere with how your body absorbs iron. Iron supplements may turn stool (feces) a darker color and it may appear black. If you cannot tolerate taking iron supplements by mouth, talk with your health care provider about taking them through an IV or through an injection into a muscle. Eating and drinking Talk with your health care provider before changing your diet. Your provider may recommend that you eat foods that contain a lot of iron, such as: Liver. Low-fat (lean) beef. Breads and cereals that have iron added to them (are fortified). Eggs. Dried fruit. Dark green, leafy vegetables. To help your body use the iron from iron-rich foods, eat those foods at the same time as fresh fruits and vegetables that are high in vitamin C. Foods that are high in vitamin C include: Oranges. Peppers. Tomatoes. Mangoes. Managing constipation If you are taking an iron supplement, it may cause constipation. To prevent or treat constipation, you may need to: Drink enough fluid to keep your urine pale yellow. Take over-the-counter or prescription medicines. Eat foods that are high in fiber, such   as beans, whole grains, and fresh fruits and vegetables. Limit foods that are high in fat and processed sugars, such as fried or sweet foods. General instructions Return to your normal activities as told by your health care provider. Ask your health care provider  what activities are safe for you. Keep all follow-up visits. Contact a health care provider if: You feel nauseous or you vomit. You feel weak. You become light-headed when getting up from a sitting or lying down position. You have unexplained sweating. You develop symptoms of constipation. You have a heaviness in your chest. You have trouble breathing with physical activity. Get help right away if: You faint. If this happens, do not drive yourself to the hospital. You have an irregular or rapid heartbeat. Summary Iron deficiency anemia is a condition in which the concentration of red blood cells or hemoglobin in the blood is below normal because of too little iron. This condition is treated by correcting the cause of your iron deficiency. Take over-the-counter and prescription medicines only as told by your health care provider. This includes iron supplements and vitamins. To help your body use the iron from iron-rich foods, eat those foods at the same time as fresh fruits and vegetables that are high in vitamin C. Seek medical help if you have signs or symptoms of worsening anemia. This information is not intended to replace advice given to you by your health care provider. Make sure you discuss any questions you have with your health care provider. Document Revised: 01/28/2021 Document Reviewed: 01/28/2021 Elsevier Patient Education  2023 [employer]  

## 2021-10-21 ENCOUNTER — Telehealth: Payer: Self-pay

## 2021-10-21 MED ORDER — RYBELSUS 3 MG PO TABS: 3.0000 mg | ORAL_TABLET | Freq: Every day | ORAL | 0 refills | Status: DC

## 2021-10-21 NOTE — Telephone Encounter (Signed)
Patient's pharmacy is calling in as the patient tried to come by for a refill of Rybelsus. Do not see that on patients medication list. Please advise.

## 2021-11-09 ENCOUNTER — Encounter: Payer: Self-pay | Admitting: Internal Medicine

## 2021-11-09 ENCOUNTER — Other Ambulatory Visit: Payer: Self-pay | Admitting: Internal Medicine

## 2021-11-09 DIAGNOSIS — E118 Type 2 diabetes mellitus with unspecified complications: Secondary | ICD-10-CM

## 2021-11-09 MED ORDER — RYBELSUS 7 MG PO TABS: 7.0000 mg | ORAL_TABLET | Freq: Every day | ORAL | 0 refills | Status: DC

## 2021-11-17 ENCOUNTER — Other Ambulatory Visit: Payer: Self-pay | Admitting: Cardiology

## 2021-12-01 ENCOUNTER — Other Ambulatory Visit: Payer: Self-pay | Admitting: Cardiology

## 2021-12-01 DIAGNOSIS — I25118 Atherosclerotic heart disease of native coronary artery with other forms of angina pectoris: Secondary | ICD-10-CM

## 2021-12-15 DIAGNOSIS — M21962 Unspecified acquired deformity of left lower leg: Secondary | ICD-10-CM | POA: Diagnosis not present

## 2021-12-15 DIAGNOSIS — I739 Peripheral vascular disease, unspecified: Secondary | ICD-10-CM | POA: Diagnosis not present

## 2021-12-15 DIAGNOSIS — M21961 Unspecified acquired deformity of right lower leg: Secondary | ICD-10-CM | POA: Diagnosis not present

## 2021-12-15 DIAGNOSIS — E139 Other specified diabetes mellitus without complications: Secondary | ICD-10-CM | POA: Diagnosis not present

## 2021-12-15 DIAGNOSIS — M792 Neuralgia and neuritis, unspecified: Secondary | ICD-10-CM | POA: Diagnosis not present

## 2021-12-23 DIAGNOSIS — I739 Peripheral vascular disease, unspecified: Secondary | ICD-10-CM | POA: Diagnosis not present

## 2021-12-25 ENCOUNTER — Other Ambulatory Visit: Payer: Self-pay | Admitting: Cardiology

## 2021-12-31 ENCOUNTER — Other Ambulatory Visit: Payer: Self-pay | Admitting: Cardiology

## 2021-12-31 DIAGNOSIS — I25118 Atherosclerotic heart disease of native coronary artery with other forms of angina pectoris: Secondary | ICD-10-CM

## 2021-12-31 DIAGNOSIS — E785 Hyperlipidemia, unspecified: Secondary | ICD-10-CM

## 2022-01-05 DIAGNOSIS — H26493 Other secondary cataract, bilateral: Secondary | ICD-10-CM | POA: Diagnosis not present

## 2022-01-28 DIAGNOSIS — M21962 Unspecified acquired deformity of left lower leg: Secondary | ICD-10-CM | POA: Diagnosis not present

## 2022-01-28 DIAGNOSIS — M21961 Unspecified acquired deformity of right lower leg: Secondary | ICD-10-CM | POA: Diagnosis not present

## 2022-01-28 DIAGNOSIS — I739 Peripheral vascular disease, unspecified: Secondary | ICD-10-CM | POA: Diagnosis not present

## 2022-01-28 DIAGNOSIS — M792 Neuralgia and neuritis, unspecified: Secondary | ICD-10-CM | POA: Diagnosis not present

## 2022-01-28 DIAGNOSIS — E139 Other specified diabetes mellitus without complications: Secondary | ICD-10-CM | POA: Diagnosis not present

## 2022-02-02 ENCOUNTER — Ambulatory Visit: Payer: Self-pay | Admitting: Licensed Clinical Social Worker

## 2022-02-02 NOTE — Patient Outreach (Signed)
  Care Coordination  Initial Visit Note   02/02/2022 Name: Tonya [name] MRN: [id number] DOB: 09/14/1950  Tonya [name] is a 72 y.o. year old female who sees Tonya Lima, MD for primary care. I spoke with  Tonya [name] by phone today.  What matters to the patients health and wellness today?   Patient reports no concerns or needs from Care Coordination team with health and wellness related to physical or mental heath. .    Goals Addressed             This Visit's Progress    COMPLETED: Care Coordination Activities No Follow up Required       Care Coordination Interventions: Reviewed Care Coordination Services:Declined Assessed Social Determinants of Health            SDOH assessments and interventions completed:  Yes  SDOH Interventions Today    Flowsheet Row Most Recent Value  SDOH Interventions   Food Insecurity Interventions Intervention Not Indicated  Housing Interventions Intervention Not Indicated  Transportation Interventions Intervention Not Indicated  Utilities Interventions Intervention Not Indicated       Care Coordination Interventions:  No, not indicated   Follow up plan: No further intervention required.   Encounter Outcome:  Pt. Visit Completed   Tonya [name], Tonya [name] [phone]

## 2022-02-02 NOTE — Patient Instructions (Signed)
    It was a pleasure speaking with you today. Per your request you have no needs for Care Coordination at this time.   Care Coordination provides support specific to your health needs that extend beyond exceptional routine office care you already receive from your primary care doctor.    If you are eligible for standard Care Coordination, there is no cost to you.  The Care Coordination team is made up of the following team members: Registered Nurse Care Guide: disease management, health education, care coordination and complex case management Clinical Social Work: Complex Care Coordination including coordination of level of care needs, mental and behavioral health assessment and recommendations, and connection to long-term mental health support Clinical Pharmacist: medication management, assistance and disease management Community Resource Care Guides: Forensic psychologist Team: dedicated team of scheduling professionals to support patient and clinical team scheduling needs  Please call [phone] if you would like to schedule a phone appointment with one of the team members.   Casimer Lanius, [place] [phone]

## 2022-02-09 ENCOUNTER — Other Ambulatory Visit: Payer: Self-pay | Admitting: Internal Medicine

## 2022-02-09 DIAGNOSIS — E118 Type 2 diabetes mellitus with unspecified complications: Secondary | ICD-10-CM

## 2022-02-28 ENCOUNTER — Encounter: Payer: Self-pay | Admitting: Internal Medicine

## 2022-03-02 ENCOUNTER — Encounter: Payer: Self-pay | Admitting: Internal Medicine

## 2022-03-02 ENCOUNTER — Ambulatory Visit (INDEPENDENT_AMBULATORY_CARE_PROVIDER_SITE_OTHER): Payer: Medicare HMO | Admitting: Internal Medicine

## 2022-03-02 VITALS — BP 124/76 | HR 62 | Temp 97.8°F | Resp 16 | Ht 60.0 in | Wt 152.0 lb

## 2022-03-02 DIAGNOSIS — I5032 Chronic diastolic (congestive) heart failure: Secondary | ICD-10-CM | POA: Insufficient documentation

## 2022-03-02 DIAGNOSIS — I25118 Atherosclerotic heart disease of native coronary artery with other forms of angina pectoris: Secondary | ICD-10-CM

## 2022-03-02 DIAGNOSIS — I10 Essential (primary) hypertension: Secondary | ICD-10-CM | POA: Diagnosis not present

## 2022-03-02 DIAGNOSIS — E785 Hyperlipidemia, unspecified: Secondary | ICD-10-CM

## 2022-03-02 DIAGNOSIS — Z0001 Encounter for general adult medical examination with abnormal findings: Secondary | ICD-10-CM

## 2022-03-02 DIAGNOSIS — Z0000 Encounter for general adult medical examination without abnormal findings: Secondary | ICD-10-CM | POA: Diagnosis not present

## 2022-03-02 DIAGNOSIS — E118 Type 2 diabetes mellitus with unspecified complications: Secondary | ICD-10-CM

## 2022-03-02 LAB — LIPID PANEL
Cholesterol: 135 mg/dL (ref 0–200)
HDL: 62.2 mg/dL (ref >39.00)
LDL Cholesterol: 59 mg/dL (ref 0–99)
NonHDL: 73.1
Total CHOL/HDL Ratio: 2
Triglycerides: 70 mg/dL (ref 0.0–149.0)
VLDL: 14 mg/dL (ref 0.0–40.0)

## 2022-03-02 LAB — HEMOGLOBIN A1C: Hgb A1c MFr Bld: 5.6 % (ref 4.6–6.5)

## 2022-03-02 LAB — BASIC METABOLIC PANEL
BUN: 23 mg/dL (ref 6–23)
CO2: 29 mEq/L (ref 19–32)
Calcium: 9.9 mg/dL (ref 8.4–10.5)
Chloride: 105 mEq/L (ref 96–112)
Creatinine, Ser: 0.69 mg/dL (ref 0.40–1.20)
GFR: 88.31 mL/min (ref >60.00)
Glucose, Bld: 87 mg/dL (ref 70–99)
Potassium: 4.2 mEq/L (ref 3.5–5.1)
Sodium: 139 mEq/L (ref 135–145)

## 2022-03-02 NOTE — Progress Notes (Signed)
Subjective:  Patient ID: Tonya [name], female    DOB: 24-Nov-1950  Age: 72 y.o. MRN: [id number]  CC: Hypertension, Hyperlipidemia, Diabetes, and Coronary Artery Disease   HPI Tonya [name] presents for f/up -----   Her diarrhea has improved since she started taking Rybelsus and she would like to increase the dose.  She is active and denies chest pain, shortness of breath, diaphoresis or edema.  She has red areas on her lower extremities that do not cause any symptoms.   Outpatient Medications Prior to Visit  Medication Sig Dispense Refill   acetaminophen (TYLENOL) 500 MG tablet Take 1,000 mg by mouth every 8 (eight) hours as needed for moderate pain.     aspirin 81 MG chewable tablet Chew 1 tablet (81 mg total) by mouth daily. 90 tablet 1   blood glucose meter kit and supplies KIT Dispense based on patient and insurance preference. Use up to four times daily as directed. 1 each 0   cholecalciferol (VITAMIN D) 25 MCG (1000 UNIT) tablet Take 1,000 Units by mouth daily.     ferrous sulfate 325 (65 FE) MG tablet Take 324 mg by mouth daily with breakfast.     Insulin Pen Needle 32G X 6 MM MISC 1 Act by Does not apply route once a week. 50 each 0   Lancets (ONETOUCH DELICA PLUS 123XX123) MISC      metoprolol tartrate (LOPRESSOR) 25 MG tablet TAKE 1 TABLET BY MOUTH 2 TIMES DAILY 180 tablet 3   Multiple Vitamin (MULTIVITAMIN) tablet Take 1 tablet by mouth daily.     olmesartan (BENICAR) 20 MG tablet TAKE 1 TABLET BY MOUTH EVERY DAY 100 tablet 0   ONETOUCH ULTRA test strip SMARTSIG:Via Meter 1-4 Times Daily     rosuvastatin (CRESTOR) 20 MG tablet TAKE 1 TABLET BY MOUTH EVERY DAY 90 tablet 3   RYBELSUS 7 MG TABS TAKE 1 TABLET BY MOUTH EVERY DAY 90 tablet 0   No facility-administered medications prior to visit.    ROS Review of Systems  Constitutional: Negative.  Negative for diaphoresis, fatigue and unexpected weight change.  HENT: Negative.    Eyes: Negative.   Negative for visual disturbance.  Respiratory:  Negative for cough, chest tightness, shortness of breath and wheezing.   Cardiovascular:  Negative for chest pain, palpitations and leg swelling.  Gastrointestinal:  Positive for diarrhea. Negative for abdominal pain, blood in stool, constipation, nausea and vomiting.  Endocrine: Negative.   Genitourinary: Negative.  Negative for difficulty urinating.  Musculoskeletal: Negative.  Negative for myalgias.  Skin:  Positive for color change and rash.  Neurological:  Negative for dizziness, weakness and light-headedness.  Hematological:  Negative for adenopathy. Does not bruise/bleed easily.  Psychiatric/Behavioral: Negative.      Objective:  BP 124/76 (BP Location: Right Arm, Patient Position: Sitting, Cuff Size: Large)   Pulse 62   Temp 97.8 F (36.6 C) (Oral)   Resp 16   Ht 5' (1.524 m)   Wt 152 lb (68.9 kg)   SpO2 96%   BMI 29.69 kg/m   BP Readings from Last 3 Encounters:  03/02/22 124/76  10/20/21 120/78  10/20/21 120/78    Wt Readings from Last 3 Encounters:  03/02/22 152 lb (68.9 kg)  10/20/21 156 lb 9.6 oz (71 kg)  10/20/21 156 lb 9.6 oz (71 kg)    Physical Exam Vitals reviewed.  Constitutional:      General: She is not in acute distress.  Appearance: She is ill-appearing. She is not toxic-appearing or diaphoretic.  HENT:     Nose: Nose normal.     Mouth/Throat:     Mouth: Mucous membranes are moist.  Eyes:     General: No scleral icterus.    Conjunctiva/sclera: Conjunctivae normal.  Cardiovascular:     Rate and Rhythm: Normal rate and regular rhythm.     Heart sounds: No murmur heard. Pulmonary:     Effort: Pulmonary effort is normal.     Breath sounds: No stridor. No wheezing, rhonchi or rales.  Abdominal:     General: Abdomen is flat.     Palpations: There is no mass.     Tenderness: There is no abdominal tenderness. There is no guarding.     Hernia: No hernia is present.  Musculoskeletal:         General: Normal range of motion.     Cervical back: Neck supple.     Right lower leg: No edema.     Left lower leg: No edema.  Lymphadenopathy:     Cervical: No cervical adenopathy.  Skin:    General: Skin is warm and dry.     Findings: Erythema and rash present.  Neurological:     General: No focal deficit present.     Mental Status: She is alert. Mental status is at baseline.  Psychiatric:        Mood and Affect: Mood normal.        Behavior: Behavior normal.     Lab Results  Component Value Date   WBC 8.4 10/20/2021   HGB 12.2 10/20/2021   HCT 37.3 10/20/2021   PLT 227.0 10/20/2021   GLUCOSE 87 03/02/2022   CHOL 135 03/02/2022   TRIG 70.0 03/02/2022   HDL 62.20 03/02/2022   LDLCALC 59 03/02/2022   ALT 26 02/20/2021   AST 24 02/20/2021   NA 139 03/02/2022   K 4.2 03/02/2022   CL 105 03/02/2022   CREATININE 0.69 03/02/2022   BUN 23 03/02/2022   CO2 29 03/02/2022   TSH 1.42 10/20/2021   INR 1.0 08/02/2019   HGBA1C 5.6 03/02/2022   MICROALBUR 11.0 (H) 10/20/2021    CARDIAC CATHETERIZATION  Result Date: 08/26/2020 Left heart catheterization 08/26/2020: LV: 112/-1, EDP 1 mmHg.  Ao 108/47, mean 70 mmHg.  There was no pressure gradient across the aortic valve. Angiographic data: LV: Normal LV systolic function.  No wall motion abnormality.  LVEF 55 to 60%. RCA: Very large caliber vessel with mild luminal irregularity.  Dominant.  Gives origin to large PDA and a large PL branch.  There is collaterals noted from the right coronary artery to the distal LAD. LM: Large vessel, smooth. Circumflex: Large caliber vessel, moderate-sized OM1, mild disease. LAD: Large vessel proximally.  There is a 90 to 95% stenosis in the proximal LAD followed by origin of a very large D1 which is LAD equivalent.  Just after D1, there is a large septal perforator to and at the site of SP2, the LAD is chronically occluded.  No significant collaterals were evident. Intervention data: Successful PTCA and  stenting of the proximal and mid LAD, mid LAD CTO with implantation of 3 overlapping stents from proximal to distal 3.0 x 18 mm Onyx 1.,  Mid segment with a 2.5 x 30 mm Onyx frontier and the edge dissection of the stent covered with a 2.0 x 12 mm Onyx frontier DES, stenosis reduced from 100% to 0% with TIMI 0 to TIMI-3 flow  at the end of the procedure. 220 mm contrast utilized.  Patient to be discharged home today with outpatient follow-up, she will need aggressive risk factor modification, started on metoprolol 25 mg p.o. twice daily, Plavix 75 mg daily which she will need for 6 months along with aspirin 81 mg indefinitely.    Assessment & Plan:   Ovida was seen today for hypertension, hyperlipidemia, diabetes and coronary artery disease.  Diagnoses and all orders for this visit:  Primary hypertension- Her blood pressure is well-controlled. -     Basic metabolic panel; Future -     Basic metabolic panel  Type II diabetes mellitus with manifestations ([place])- Her blood sugar is well-controlled. -     Hemoglobin A1c; Future -     Basic metabolic panel; Future -     Basic metabolic panel -     Hemoglobin A1c -     Semaglutide (RYBELSUS) 14 MG TABS; Take 1 tablet (14 mg total) by mouth daily. -     empagliflozin (JARDIANCE) 10 MG TABS tablet; Take 1 tablet (10 mg total) by mouth daily before breakfast.  Hyperlipidemia LDL goal <70- LDL goal achieved. Doing well on the statin  -     Lipid panel; Future -     Lipid panel  Chronic heart failure with preserved ejection fraction ([place])- I recommended she start taking Jardiance for cardiovascular risk reduction. -     empagliflozin (JARDIANCE) 10 MG TABS tablet; Take 1 tablet (10 mg total) by mouth daily before breakfast.  Encounter for general adult medical examination with abnormal findings- Exam completed, labs reviewed, vaccines are up-to-date, cancer screenings are up-to-date, patient education was given.  Coronary artery disease of native  artery of native heart with stable angina pectoris (HCC) -     Semaglutide (RYBELSUS) 14 MG TABS; Take 1 tablet (14 mg total) by mouth daily.   I have discontinued Arbie Cookey A. Bia's Rybelsus. I am also having her start on Rybelsus and empagliflozin. Additionally, I am having her maintain her multivitamin, cholecalciferol, acetaminophen, aspirin, ferrous sulfate, Insulin Pen Needle, blood glucose meter kit and supplies, OneTouch Ultra, OneTouch Delica Plus 0000000, olmesartan, rosuvastatin, and metoprolol tartrate.  Meds ordered this encounter  Medications   Semaglutide (RYBELSUS) 14 MG TABS    Sig: Take 1 tablet (14 mg total) by mouth daily.    Dispense:  90 tablet    Refill:  1   empagliflozin (JARDIANCE) 10 MG TABS tablet    Sig: Take 1 tablet (10 mg total) by mouth daily before breakfast.    Dispense:  90 tablet    Refill:  1     Follow-up: No follow-ups on file.  Scarlette Calico, MD

## 2022-03-03 MED ORDER — RYBELSUS 14 MG PO TABS: 14.0000 mg | ORAL_TABLET | Freq: Every day | ORAL | 1 refills | Status: DC

## 2022-03-05 ENCOUNTER — Encounter: Payer: Self-pay | Admitting: Internal Medicine

## 2022-03-05 MED ORDER — EMPAGLIFLOZIN 10 MG PO TABS: 10.0000 mg | ORAL_TABLET | Freq: Every day | ORAL | 1 refills | Status: DC

## 2022-03-05 NOTE — Patient Instructions (Signed)

## 2022-03-16 ENCOUNTER — Other Ambulatory Visit: Payer: Self-pay | Admitting: Internal Medicine

## 2022-03-16 ENCOUNTER — Encounter: Payer: Self-pay | Admitting: Internal Medicine

## 2022-03-16 DIAGNOSIS — I10 Essential (primary) hypertension: Secondary | ICD-10-CM

## 2022-03-16 DIAGNOSIS — I110 Hypertensive heart disease with heart failure: Secondary | ICD-10-CM

## 2022-04-01 DIAGNOSIS — L570 Actinic keratosis: Secondary | ICD-10-CM | POA: Diagnosis not present

## 2022-04-08 ENCOUNTER — Encounter: Payer: Self-pay | Admitting: Cardiology

## 2022-04-08 ENCOUNTER — Ambulatory Visit: Payer: Medicare HMO | Admitting: Cardiology

## 2022-04-08 VITALS — BP 118/69 | HR 66 | Resp 16 | Ht 60.0 in | Wt 143.6 lb

## 2022-04-08 DIAGNOSIS — I25118 Atherosclerotic heart disease of native coronary artery with other forms of angina pectoris: Secondary | ICD-10-CM | POA: Diagnosis not present

## 2022-04-08 DIAGNOSIS — I10 Essential (primary) hypertension: Secondary | ICD-10-CM | POA: Diagnosis not present

## 2022-04-08 DIAGNOSIS — E785 Hyperlipidemia, unspecified: Secondary | ICD-10-CM | POA: Diagnosis not present

## 2022-04-08 NOTE — Progress Notes (Signed)
Primary Physician/Referring:  [name], MD  Patient ID: Tonya [name], female    DOB: 1950/11/09, 72 y.o.   MRN: [id number]  No chief complaint on file.  HPI:    Tonya [name]  is a 72 y.o. Caucasian female with Coronary disease and complex angioplasty to proximal to mid to distal segment of the LAD on 08/26/2020, hypertension, chronic diastolic heart failure heart failure, hyperlipidemia, obesity, rectal adenocarcinoma, s/p resection with colostomy, and gastric bypass.  Since being on GLP-1 agonist, initially started as Ozempic but now on Rybelsus she has lost significant amount of weight and has maintained weight loss.  She is feeling the best she has in quite a while.  Patient has had no recurrence of dyspnea or chest pain since angioplasty in 08/2020.  She is presently asymptomatic.     Past Medical History:  Diagnosis Date  . Anemia    few yrs ago  . C. difficile diarrhea 03/2018  . Cancer    rectal  . CHF (congestive heart failure)   . Coronary artery disease 07/2003   occluded LAD wtih right-to-left collaterals 07/24/03; no current cardiologist  . Diabetes mellitus without complication   . Elbow fracture, left   . History of chemotherapy 01/2018   radaition last tx jan 2019  . Myocardial infarction age 27's  . Obesity 2003   s/p gastric bypass   . Primary hypertension 07/30/2020  . Vitamin D deficiency   . Wears glasses      Social History   Tobacco Use  . Smoking status: Never  . Smokeless tobacco: Never  Substance Use Topics  . Alcohol use: No   Marital Status: Single  ROS  Review of Systems  Cardiovascular:  Negative for chest pain, claudication, dyspnea on exertion and leg swelling.  Gastrointestinal:  Positive for diarrhea (hx of rectal cancer).   Objective  Blood pressure 118/69, pulse 66, resp. rate 16, height 5' (1.524 m), weight 143 lb 9.6 oz (65.1 kg), SpO2 99 %. Body mass index is 28.04 kg/m.     04/08/2022   10:55 AM 03/02/2022     9:13 AM 10/20/2021    9:39 AM  Vitals with BMI  Height 5\' 0"  5\' 0"  5\' 0"   Weight 143 lbs 10 oz 152 lbs 156 lbs 10 oz  BMI 28.05 123XX123 [id number]  Systolic 123456 [id number]  Diastolic 69 76 78  Pulse 66 62 78    Physical Exam Vitals reviewed.  Neck:     Vascular: No carotid bruit or JVD.  Cardiovascular:     Rate and Rhythm: Normal rate and regular rhythm.     Pulses:          Dorsalis pedis pulses are 2+ on the right side and 2+ on the left side.       Posterior tibial pulses are 0 on the right side and 0 on the left side.     Heart sounds: Normal heart sounds. No murmur heard.    No gallop.  Pulmonary:     Effort: Pulmonary effort is normal.     Breath sounds: Normal breath sounds.  Abdominal:     General: Bowel sounds are normal.     Palpations: Abdomen is soft.  Musculoskeletal:     Right lower leg: No edema.     Left lower leg: No edema.     Laboratory examination:   Lab Results  Component Value Date   NA 139 03/02/2022   K 4.2 03/02/2022  CO2 29 03/02/2022   GLUCOSE 87 03/02/2022   BUN 23 03/02/2022   CREATININE 0.69 03/02/2022   CALCIUM 9.9 03/02/2022   EGFR >60 01/05/2017   GFRNONAA >60 03/28/2020       Latest Ref Rng & Units 03/02/2022    9:59 AM 10/20/2021   10:22 AM 06/19/2021   10:58 AM  CMP  Glucose 70 - 99 mg/dL 87  86  93   BUN 6 - 23 mg/dL 23  16  17    Creatinine 0.40 - 1.20 mg/dL 0.69  0.68  0.77   Sodium 135 - 145 mEq/L 139  140  141   Potassium 3.5 - 5.1 mEq/L 4.2  3.8  3.7   Chloride 96 - 112 mEq/L 105  104  104   CO2 19 - 32 mEq/L 29  29  30    Calcium 8.4 - 10.5 mg/dL 9.9  9.7  9.6       Latest Ref Rng & Units 10/20/2021   10:22 AM 10/16/2020    4:52 PM 07/30/2020    2:32 PM  CBC  WBC 4.0 - 10.5 K/uL 8.4  5.8  6.4   Hemoglobin 12.0 - 15.0 g/dL 12.2  11.1  12.0   Hematocrit 36.0 - 46.0 % 37.3  33.9  37.0   Platelets 150.0 - 400.0 K/uL 227.0  226.0  258.0     Lab Results  Component Value Date   CHOL 135 03/02/2022   HDL 62.20  03/02/2022   LDLCALC 59 03/02/2022   TRIG 70.0 03/02/2022   CHOLHDL 2 03/02/2022    HEMOGLOBIN A1C Lab Results  Component Value Date   HGBA1C 5.6 03/02/2022   MPG 100 05/03/2013   TSH Recent Labs    10/20/21 1022  TSH 1.42    Allergies   Allergies  Allergen Reactions  . Bactrim [Sulfamethoxazole-Trimethoprim] Anaphylaxis, Hives and Other (See Comments)  . Phenergan [Promethazine Hcl]     Hives  . Sulfa Antibiotics     anaphylaxis  . Penicillins Other (See Comments)    REACTION: As a child.  CEPHALOSPORIN TOLERANT 08/02/2019     Medication   Current Outpatient Medications:  .  aspirin 81 MG chewable tablet, Chew 1 tablet (81 mg total) by mouth daily., Disp: 90 tablet, Rfl: 1 .  blood glucose meter kit and supplies KIT, Dispense based on patient and insurance preference. Use up to four times daily as directed., Disp: 1 each, Rfl: 0 .  cholecalciferol (VITAMIN D) 25 MCG (1000 UNIT) tablet, Take 1,000 Units by mouth daily., Disp: , Rfl:  .  empagliflozin (JARDIANCE) 10 MG TABS tablet, Take 1 tablet (10 mg total) by mouth daily before breakfast., Disp: 90 tablet, Rfl: 1 .  ferrous sulfate 325 (65 FE) MG tablet, Take 324 mg by mouth daily with breakfast., Disp: , Rfl:  .  Insulin Pen Needle 32G X 6 MM MISC, 1 Act by Does not apply route once a week., Disp: 50 each, Rfl: 0 .  Lancets (ONETOUCH DELICA PLUS 123XX123) MISC, , Disp: , Rfl:  .  Multiple Vitamin (MULTIVITAMIN) tablet, Take 1 tablet by mouth daily., Disp: , Rfl:  .  olmesartan (BENICAR) 20 MG tablet, TAKE 1 TABLET BY MOUTH EVERY DAY, Disp: 100 tablet, Rfl: 0 .  rosuvastatin (CRESTOR) 20 MG tablet, TAKE 1 TABLET BY MOUTH EVERY DAY, Disp: 90 tablet, Rfl: 3 .  Semaglutide (RYBELSUS) 14 MG TABS, Take 1 tablet (14 mg total) by mouth daily., Disp: 90 tablet, Rfl: 1 .  acetaminophen (TYLENOL) 500 MG tablet, Take 1,000 mg by mouth every 8 (eight) hours as needed for moderate pain., Disp: , Rfl:     Radiology:   No  results found. Since 2021  Cardiac Studies:   Nuclear Stress Tests 08/08/2020 1. Fixed perfusion defect in mid to apical anterior/anteroseptal wall and apex with hypokinesis in this region, consistent with prior infarct 2. Fixed inferolateral perfusion defect with normal wall motion, consistent with artifact 3. Mild systolic dysfunction (EF 99991111) 4. Intermediate risk study, suggests LAD territory infarct  PCV ECHOCARDIOGRAM COMPLETE 08/19/2020  Narrative Echocardiogram 08/19/2020: Left ventricle cavity is normal in size and wall thickness. Normal global wall motion. Normal LV systolic function with EF 62%. Normal diastolic filling pattern. No significant valvular abnormality. Normal right atrial pressure.    Left heart catheterization 08/26/2020: LV: 112/-1, EDP 1 mmHg.  Ao 108/47, mean 70 mmHg.  There was no pressure gradient across the aortic valve.   Angiographic data: LV: Normal LV systolic function.  No wall motion abnormality.  LVEF 55 to 60%. RCA: Very large caliber vessel with mild luminal irregularity.  Dominant.  Gives origin to large PDA and a large PL branch.  There is collaterals noted from the right coronary artery to the distal LAD. LM: Large vessel, smooth. Circumflex: Large caliber vessel, moderate-sized OM1, mild disease. LAD: Large vessel proximally.  There is a 90 to 95% stenosis in the proximal LAD followed by origin of a very large D1 which is LAD equivalent.  Just after D1, there is a large septal perforator to and at the site of SP2, the LAD is chronically occluded.  No significant collaterals were evident.    08/26/2020: Proximal and mid LAD, mid LAD CTO with implantation of 3 overlapping stents from proximal to distal 3.0 x 18 mm Onyx 1.,  Mid segment with a 2.5 x 30 mm Onyx frontier and distal 2.0 x 12 mm Onyx frontier DES,  EKG:   EKG 04/08/2022: Normal sinus rhythm at rate of 59 bpm, normal EKG.  Compared to 10/06/2021, no significant change.  Assessment      ICD-10-CM   1. Atherosclerosis of native coronary artery of native heart with stable angina pectoris  I25.118 EKG 12-Lead    2. Primary hypertension  I10     3. Hyperlipidemia LDL goal <70  E78.5        Medications Discontinued During This Encounter  Medication Reason  . ONETOUCH ULTRA test strip   . metoprolol tartrate (LOPRESSOR) 25 MG tablet Completed Course     No orders of the defined types were placed in this encounter.   Orders Placed This Encounter  Procedures  . EKG 12-Lead   Recommendations:   Tonya [name] is a 72 y.o. Caucasian female with Coronary disease and complex angioplasty to proximal to mid to distal segment of the LAD on 08/26/2020, hypertension, chronic diastolic heart failure heart failure, hyperlipidemia, obesity, rectal adenocarcinoma, s/p resection with colostomy, and gastric bypass.   1. Atherosclerosis of native coronary artery of native heart with stable angina pectoris Patient is here for 57-month office visit, her last angioplasty was in August 2022, as she is completely asymptomatic and also she has lost significant amount of weight from morbid obesity now she is just overweight and BMI of 28, blood pressure being soft, will discontinue metoprolol tartrate.  She will continue with olmesartan for cardiovascular protection.  She will continue aspirin indefinitely.  - EKG 12-Lead  2. Primary hypertension With weight loss, significant improvement  in blood pressure as well, no changes in the medication needed with regard to ARB, continue good hydration as well.  Renal function has remained stable.  External labs reviewed.  3. Hyperlipidemia LDL goal <70 Lipids in excellent control.  She will continue Crestor 20 mg daily.  From cardiac standpoint she has remained stable, I will see her back in a year or sooner if problems.     Adrian Prows, MD, [facility] 04/08/2022, 11:21 AM Office: [phone] Fax: [phone] Pager: [phone]

## 2022-06-09 ENCOUNTER — Encounter: Payer: Self-pay | Admitting: Internal Medicine

## 2022-06-15 ENCOUNTER — Telehealth: Payer: Self-pay

## 2022-06-15 NOTE — Progress Notes (Signed)
   06/15/2022  Patient ID: Tonya [name], female   DOB: 14-Sep-1950, 72 y.o.   MRN: [id number]  Patient outreach to discuss medication affordability after receiving a request for assistance in regard to Jardiance from patient's PCP, Dr. Yetta Barre.  Unable to make contact but left a voicemail with my direct number for patient to return my call at her convenience.  Lenna Gilford, PharmD, DPLA

## 2022-06-15 NOTE — Progress Notes (Signed)
   06/15/2022  Patient ID: Tonya [name], female   DOB: [birth year], 72 y.o.   MRN: [id number]  Patient returning my call from this morning Jardiance 1 month supply >$100  Previously on Ozempic but having a hard time finding, so Dr. Yetta Barre placed her on Rybelsus and Jardiance No DM Rybelsus also very costly Wishes to go back to Ozempic if possible-contacted insurance and Ozempic is covered on plan Tier level 3 (may req PA) Of note, Rybelsus is tier 3, so Ozempic should be some amount out of pocket for patient

## 2022-06-18 ENCOUNTER — Telehealth: Payer: Self-pay

## 2022-06-18 ENCOUNTER — Other Ambulatory Visit: Payer: Self-pay | Admitting: Internal Medicine

## 2022-06-18 DIAGNOSIS — I2510 Atherosclerotic heart disease of native coronary artery without angina pectoris: Secondary | ICD-10-CM

## 2022-06-18 DIAGNOSIS — E119 Type 2 diabetes mellitus without complications: Secondary | ICD-10-CM

## 2022-06-18 DIAGNOSIS — E118 Type 2 diabetes mellitus with unspecified complications: Secondary | ICD-10-CM

## 2022-06-18 MED ORDER — SEMAGLUTIDE (1 MG/DOSE) 4 MG/3ML ~~LOC~~ SOPN: 1.0000 mg | PEN_INJECTOR | SUBCUTANEOUS | 0 refills | Status: DC

## 2022-06-18 MED ORDER — INSULIN PEN NEEDLE 32G X 6 MM MISC: 1.0000 | 0 refills | Status: AC

## 2022-06-18 NOTE — Progress Notes (Unsigned)
   06/18/2022  Patient ID: Tonya [name], female   DOB: 08/16/1950, 72 y.o.   MRN: [id number]  S/O:  Diabetes Management Plan -Contacted [facility] to check on Ozempic 1mg  weekly cost.  Prescription is going through okay on insurance, but 1 month will cost $238.18.   -Contacted patient to discuss affordability of Ozempic 1mg  weekly versus Rybelsus 14mg  + Jardiance 10mg  daily regimen.  Stopping Rybelsus and Jardiance and starting Ozempic 1mg  weekly will reduce monthly medication costs for her dramatically.  A/P:  Diabetes Management Plan -Ozempic has been shown to provide renal and cardiac protection, and patient also has an ARB on board for additional kidney benefit.  Recent A1c of 5.6% and eGFR of 88 further indicate mono therapy with Ozempic 1mg  weekly should be effective/appropriate -Recommend that once on-hand supply of Rybelsus 14mg  is depleted, patient initiate Ozempic 1mg  weekly -Patient plans to stop Jardiance 10mg  due to cost, adequate DM control, and renal/cardiac protection provided with ARB and Ozempic -Recommend f/u with Dr. Yetta Barre to evaluate A1c in 3 months.  If needed, Ozempic can be increased to 2mg  weekly.  Follow-up:  None scheduled at this time but can as needed per PCP.  Patient also has my direct number for any future medication needs.  Lenna Gilford, PharmD, DPLA

## 2022-06-29 ENCOUNTER — Other Ambulatory Visit: Payer: Self-pay | Admitting: Internal Medicine

## 2022-06-29 ENCOUNTER — Ambulatory Visit: Payer: Medicare HMO | Admitting: Internal Medicine

## 2022-06-29 DIAGNOSIS — I110 Hypertensive heart disease with heart failure: Secondary | ICD-10-CM

## 2022-06-29 DIAGNOSIS — I10 Essential (primary) hypertension: Secondary | ICD-10-CM

## 2022-07-05 DIAGNOSIS — L570 Actinic keratosis: Secondary | ICD-10-CM | POA: Diagnosis not present

## 2022-07-05 DIAGNOSIS — L814 Other melanin hyperpigmentation: Secondary | ICD-10-CM | POA: Diagnosis not present

## 2022-07-05 DIAGNOSIS — D225 Melanocytic nevi of trunk: Secondary | ICD-10-CM | POA: Diagnosis not present

## 2022-07-05 DIAGNOSIS — L821 Other seborrheic keratosis: Secondary | ICD-10-CM | POA: Diagnosis not present

## 2022-07-15 ENCOUNTER — Other Ambulatory Visit: Payer: Self-pay | Admitting: Internal Medicine

## 2022-07-15 DIAGNOSIS — I110 Hypertensive heart disease with heart failure: Secondary | ICD-10-CM

## 2022-07-15 DIAGNOSIS — I10 Essential (primary) hypertension: Secondary | ICD-10-CM

## 2022-07-22 ENCOUNTER — Other Ambulatory Visit: Payer: Self-pay | Admitting: Internal Medicine

## 2022-07-22 DIAGNOSIS — I110 Hypertensive heart disease with heart failure: Secondary | ICD-10-CM

## 2022-07-22 DIAGNOSIS — I10 Essential (primary) hypertension: Secondary | ICD-10-CM

## 2022-07-26 ENCOUNTER — Other Ambulatory Visit: Payer: Self-pay | Admitting: Internal Medicine

## 2022-07-26 DIAGNOSIS — E118 Type 2 diabetes mellitus with unspecified complications: Secondary | ICD-10-CM

## 2022-07-26 DIAGNOSIS — I2510 Atherosclerotic heart disease of native coronary artery without angina pectoris: Secondary | ICD-10-CM

## 2022-08-11 DIAGNOSIS — L218 Other seborrheic dermatitis: Secondary | ICD-10-CM | POA: Diagnosis not present

## 2022-08-23 ENCOUNTER — Other Ambulatory Visit: Payer: Self-pay | Admitting: Internal Medicine

## 2022-08-23 DIAGNOSIS — I2510 Atherosclerotic heart disease of native coronary artery without angina pectoris: Secondary | ICD-10-CM

## 2022-08-23 DIAGNOSIS — E118 Type 2 diabetes mellitus with unspecified complications: Secondary | ICD-10-CM

## 2022-08-25 ENCOUNTER — Telehealth: Payer: Self-pay

## 2022-08-25 NOTE — Progress Notes (Signed)
   08/25/2022  Patient ID: [name], female   DOB: 11-Jun-1950, 72 y.o.   MRN: [id number]  Returning missed call/voicemail from patient stating she is needing a refill on Ozempic 1mg  weekly; but the request was denied by Dr. Yetta Barre, stating she is needing an appointment.  Upon reviewing her chart, she has not had an A1c since February.  Since Jardiance was stopped recently due to cost and Rybelsus 14mg  was switched to Ozempic 1mg , Dr. Yetta Barre likely is wanting to get a follow-up A1c value.  Advised patient to call office to schedule a follow-up visit and see if Dr. Yetta Barre would be willing to refill x1 until she can be seen, because she is due for her next dose Monday.  If A1c is elevated (>7%), can increase Ozempic to 2mg  weekly.  Lenna Gilford, PharmD, DPLA

## 2022-08-26 ENCOUNTER — Other Ambulatory Visit: Payer: Self-pay | Admitting: Internal Medicine

## 2022-08-26 DIAGNOSIS — I2510 Atherosclerotic heart disease of native coronary artery without angina pectoris: Secondary | ICD-10-CM

## 2022-08-26 DIAGNOSIS — E119 Type 2 diabetes mellitus without complications: Secondary | ICD-10-CM

## 2022-08-26 DIAGNOSIS — E118 Type 2 diabetes mellitus with unspecified complications: Secondary | ICD-10-CM

## 2022-08-26 MED ORDER — OZEMPIC (1 MG/DOSE) 4 MG/3ML ~~LOC~~ SOPN
1.0000 mg | PEN_INJECTOR | SUBCUTANEOUS | 0 refills | Status: DC
Start: 2022-08-26 — End: 2022-09-10

## 2022-09-09 ENCOUNTER — Ambulatory Visit (INDEPENDENT_AMBULATORY_CARE_PROVIDER_SITE_OTHER): Payer: Medicare HMO | Admitting: Internal Medicine

## 2022-09-09 ENCOUNTER — Encounter: Payer: Self-pay | Admitting: Internal Medicine

## 2022-09-09 VITALS — BP 126/68 | HR 87 | Temp 98.2°F | Resp 16 | Ht 60.0 in | Wt 135.0 lb

## 2022-09-09 DIAGNOSIS — Z7985 Long-term (current) use of injectable non-insulin antidiabetic drugs: Secondary | ICD-10-CM

## 2022-09-09 DIAGNOSIS — I10 Essential (primary) hypertension: Secondary | ICD-10-CM

## 2022-09-09 DIAGNOSIS — D500 Iron deficiency anemia secondary to blood loss (chronic): Secondary | ICD-10-CM

## 2022-09-09 DIAGNOSIS — Z1159 Encounter for screening for other viral diseases: Secondary | ICD-10-CM

## 2022-09-09 DIAGNOSIS — E118 Type 2 diabetes mellitus with unspecified complications: Secondary | ICD-10-CM | POA: Diagnosis not present

## 2022-09-09 DIAGNOSIS — I2510 Atherosclerotic heart disease of native coronary artery without angina pectoris: Secondary | ICD-10-CM

## 2022-09-09 DIAGNOSIS — Z23 Encounter for immunization: Secondary | ICD-10-CM

## 2022-09-09 LAB — CBC WITH DIFFERENTIAL/PLATELET
Basophils Absolute: 0 10*3/uL (ref 0.0–0.1)
Basophils Relative: 0.2 % (ref 0.0–3.0)
Eosinophils Absolute: 0.1 10*3/uL (ref 0.0–0.7)
Eosinophils Relative: 0.9 % (ref 0.0–5.0)
HCT: 38 % (ref 36.0–46.0)
Hemoglobin: 12.3 g/dL (ref 12.0–15.0)
Lymphocytes Relative: 11.1 % — ABNORMAL LOW (ref 12.0–46.0)
Lymphs Abs: 0.9 10*3/uL (ref 0.7–4.0)
MCHC: 32.5 g/dL (ref 30.0–36.0)
MCV: 95.2 fl (ref 78.0–100.0)
Monocytes Absolute: 0.6 10*3/uL (ref 0.1–1.0)
Monocytes Relative: 7.4 % (ref 3.0–12.0)
Neutro Abs: 6.7 10*3/uL (ref 1.4–7.7)
Neutrophils Relative %: 80.4 % — ABNORMAL HIGH (ref 43.0–77.0)
Platelets: 219 10*3/uL (ref 150.0–400.0)
RBC: 3.99 Mil/uL (ref 3.87–5.11)
RDW: 12.9 % (ref 11.5–15.5)
WBC: 8.3 10*3/uL (ref 4.0–10.5)

## 2022-09-09 LAB — BASIC METABOLIC PANEL
BUN: 22 mg/dL (ref 6–23)
CO2: 30 meq/L (ref 19–32)
Calcium: 9.2 mg/dL (ref 8.4–10.5)
Chloride: 106 meq/L (ref 96–112)
Creatinine, Ser: 0.79 mg/dL (ref 0.40–1.20)
GFR: 75.83 mL/min (ref >60.00)
Glucose, Bld: 90 mg/dL (ref 70–99)
Potassium: 3.8 meq/L (ref 3.5–5.1)
Sodium: 141 meq/L (ref 135–145)

## 2022-09-09 LAB — MICROALBUMIN / CREATININE URINE RATIO
Creatinine,U: 147.9 mg/dL
Microalb Creat Ratio: 0.9 mg/g (ref 0.0–30.0)
Microalb, Ur: 1.4 mg/dL (ref 0.0–1.9)

## 2022-09-09 LAB — IBC + FERRITIN
Ferritin: 328.9 ng/mL — ABNORMAL HIGH (ref 10.0–291.0)
Iron: 48 ug/dL (ref 42–145)
Saturation Ratios: 16.5 % — ABNORMAL LOW (ref 20.0–50.0)
TIBC: 291.2 ug/dL (ref 250.0–450.0)
Transferrin: 208 mg/dL — ABNORMAL LOW (ref 212.0–360.0)

## 2022-09-09 LAB — HEMOGLOBIN A1C: Hgb A1c MFr Bld: 5.5 % (ref 4.6–6.5)

## 2022-09-09 NOTE — Patient Instructions (Signed)

## 2022-09-09 NOTE — Progress Notes (Signed)
Subjective:  Patient ID: Tonya [name], female    DOB: [birth year]  Age: 72 y.o. MRN: [id number]  CC: Coronary Artery Disease, Hypertension, and Diabetes   HPI Tonya [name] presents for f/up -----  Discussed the use of AI scribe software for clinical note transcription with the patient, who gave verbal consent to proceed.  History of Present Illness   The patient, on a regimen of Ozempic (semaglutide), presents for routine follow-up and blood work. She reports no chest pain, shortness of breath, fatigue, or lightheadedness. She has recently received a flu shot and is up-to-date on shingles vaccinations.  The patient reports occasional dizziness, which she attributes to her heart medication, but it is not severe enough to cause significant concern. She manages it by standing for a moment before walking, particularly when getting up at night.  She is also taking an over-the-counter iron supplement and a bariatric vitamin with iron, with no reported symptoms of iron deficiency.  The patient has been experiencing no side effects from the semaglutide, including abdominal pain, constipation, vomiting, or diarrhea.  The patient's blood pressure at the time of the visit was 126/68. She has been maintaining a good diet, including seafood like lobster.       Outpatient Medications Prior to Visit  Medication Sig Dispense Refill   acetaminophen (TYLENOL) 500 MG tablet Take 1,000 mg by mouth every 8 (eight) hours as needed for moderate pain.     aspirin 81 MG chewable tablet Chew 1 tablet (81 mg total) by mouth daily. 90 tablet 1   blood glucose meter kit and supplies KIT Dispense based on patient and insurance preference. Use up to four times daily as directed. 1 each 0   cholecalciferol (VITAMIN D) 25 MCG (1000 UNIT) tablet Take 1,000 Units by mouth daily.     ferrous sulfate 325 (65 FE) MG tablet Take 324 mg by mouth daily with breakfast.     Insulin Pen Needle 32G X 6 MM MISC 1  Act by Does not apply route once a week. 50 each 0   Lancets (ONETOUCH DELICA PLUS LANCET33G) MISC      Multiple Vitamin (MULTIVITAMIN) tablet Take 1 tablet by mouth daily.     olmesartan (BENICAR) 20 MG tablet TAKE 1 TABLET BY MOUTH EVERY DAY 100 tablet 0   rosuvastatin (CRESTOR) 20 MG tablet TAKE 1 TABLET BY MOUTH EVERY DAY 90 tablet 3   Semaglutide, 1 MG/DOSE, (OZEMPIC, 1 MG/DOSE,) 4 MG/3ML SOPN Inject 1 mg into the skin once a week. 3 mL 0   No facility-administered medications prior to visit.    ROS Review of Systems  Constitutional:  Negative for chills, diaphoresis, fatigue and unexpected weight change.  HENT: Negative.    Eyes: Negative.   Respiratory:  Positive for apnea. Negative for cough, shortness of breath and wheezing.   Cardiovascular:  Negative for chest pain, palpitations and leg swelling.  Gastrointestinal:  Negative for abdominal pain, constipation, diarrhea, nausea and vomiting.  Genitourinary: Negative.  Negative for difficulty urinating and hematuria.  Musculoskeletal: Negative.   Skin: Negative.   Neurological:  Positive for dizziness. Negative for weakness.  Hematological:  Negative for adenopathy. Does not bruise/bleed easily.  Psychiatric/Behavioral: Negative.      Objective:  BP 126/68 (BP Location: Left Arm, Patient Position: Sitting, Cuff Size: Large)   Pulse 87   Temp 98.2 F (36.8 C) (Oral)   Resp 16   Ht 5' (1.524 m)   Wt 135 lb (61.2 kg)  SpO2 97%   BMI 26.37 kg/m   BP Readings from Last 3 Encounters:  09/09/22 126/68  04/08/22 118/69  03/02/22 124/76    Wt Readings from Last 3 Encounters:  09/09/22 135 lb (61.2 kg)  04/08/22 143 lb 9.6 oz (65.1 kg)  03/02/22 152 lb (68.9 kg)    Physical Exam Vitals reviewed.  Constitutional:      Appearance: Normal appearance.  HENT:     Mouth/Throat:     Mouth: Mucous membranes are moist.  Eyes:     General: No scleral icterus.    Conjunctiva/sclera: Conjunctivae normal.  Cardiovascular:      Rate and Rhythm: Normal rate and regular rhythm.     Heart sounds: No murmur heard. Pulmonary:     Effort: Pulmonary effort is normal.     Breath sounds: No stridor. No wheezing, rhonchi or rales.  Abdominal:     General: Abdomen is flat.     Palpations: There is no mass.     Tenderness: There is no abdominal tenderness. There is no guarding.     Hernia: No hernia is present.  Musculoskeletal:        General: Normal range of motion.     Cervical back: Neck supple.     Right lower leg: No edema.     Left lower leg: No edema.  Lymphadenopathy:     Cervical: No cervical adenopathy.  Skin:    General: Skin is warm and dry.  Neurological:     General: No focal deficit present.     Mental Status: She is alert. Mental status is at baseline.  Psychiatric:        Mood and Affect: Mood normal.        Behavior: Behavior normal.     Lab Results  Component Value Date   WBC 8.3 09/09/2022   HGB 12.3 09/09/2022   HCT 38.0 09/09/2022   PLT 219.0 09/09/2022   GLUCOSE 90 09/09/2022   CHOL 135 03/02/2022   TRIG 70.0 03/02/2022   HDL 62.20 03/02/2022   LDLCALC 59 03/02/2022   ALT 26 02/20/2021   AST 24 02/20/2021   NA 141 09/09/2022   K 3.8 09/09/2022   CL 106 09/09/2022   CREATININE 0.79 09/09/2022   BUN 22 09/09/2022   CO2 30 09/09/2022   TSH 1.42 10/20/2021   INR 1.0 08/02/2019   HGBA1C 5.5 09/09/2022   MICROALBUR 1.4 09/09/2022    CARDIAC CATHETERIZATION  Result Date: 08/26/2020 Left heart catheterization 08/26/2020: LV: 112/-1, EDP 1 mmHg.  Ao 108/47, mean 70 mmHg.  There was no pressure gradient across the aortic valve. Angiographic data: LV: Normal LV systolic function.  No wall motion abnormality.  LVEF 55 to 60%. RCA: Very large caliber vessel with mild luminal irregularity.  Dominant.  Gives origin to large PDA and a large PL branch.  There is collaterals noted from the right coronary artery to the distal LAD. LM: Large vessel, smooth. Circumflex: Large caliber  vessel, moderate-sized OM1, mild disease. LAD: Large vessel proximally.  There is a 90 to 95% stenosis in the proximal LAD followed by origin of a very large D1 which is LAD equivalent.  Just after D1, there is a large septal perforator to and at the site of SP2, the LAD is chronically occluded.  No significant collaterals were evident. Intervention data: Successful PTCA and stenting of the proximal and mid LAD, mid LAD CTO with implantation of 3 overlapping stents from proximal to distal 3.0 x 18  mm Onyx 1.,  Mid segment with a 2.5 x 30 mm Onyx frontier and the edge dissection of the stent covered with a 2.0 x 12 mm Onyx frontier DES, stenosis reduced from 100% to 0% with TIMI 0 to TIMI-3 flow at the end of the procedure. 220 mm contrast utilized.  Patient to be discharged home today with outpatient follow-up, she will need aggressive risk factor modification, started on metoprolol 25 mg p.o. twice daily, Plavix 75 mg daily which she will need for 6 months along with aspirin 81 mg indefinitely.    Assessment & Plan:  Iron deficiency anemia due to chronic blood loss- H&H and iron are normal. -     CBC with Differential/Platelet; Future -     IBC + Ferritin; Future  Type II diabetes mellitus with manifestations (HCC)- Her blood sugar is very well-controlled. -     Basic metabolic panel; Future -     Hemoglobin A1c; Future -     Urinalysis, Routine w reflex microscopic; Future -     Microalbumin / creatinine urine ratio; Future -     Ozempic (1 MG/DOSE); Inject 1 mg into the skin once a week.  Dispense: 3 mL; Refill: 1  Need for hepatitis C screening test -     Hepatitis C antibody; Future  Primary hypertension- Her blood pressure is well-controlled. -     Basic metabolic panel; Future -     Urinalysis, Routine w reflex microscopic; Future  Flu vaccine need -     Flu Vaccine Trivalent High Dose (Fluad)  Coronary artery disease involving native coronary artery of native heart without angina  pectoris- Will address risk factor modifications. -     Ozempic (1 MG/DOSE); Inject 1 mg into the skin once a week.  Dispense: 3 mL; Refill: 1     Follow-up: Return in about 6 months (around 03/09/2023).  Sanda Linger, MD

## 2022-09-10 LAB — URINALYSIS, ROUTINE W REFLEX MICROSCOPIC
Bilirubin Urine: NEGATIVE
Hgb urine dipstick: NEGATIVE
Ketones, ur: NEGATIVE
Leukocytes,Ua: NEGATIVE
Nitrite: NEGATIVE
Specific Gravity, Urine: >=1.03 — AB (ref 1.000–1.030)
Total Protein, Urine: NEGATIVE
Urine Glucose: NEGATIVE
Urobilinogen, UA: 0.2 (ref 0.0–1.0)
pH: 6 (ref 5.0–8.0)

## 2022-09-10 LAB — HEPATITIS C ANTIBODY: Hepatitis C Ab: NONREACTIVE

## 2022-09-10 MED ORDER — OZEMPIC (1 MG/DOSE) 4 MG/3ML ~~LOC~~ SOPN: 1.0000 mg | PEN_INJECTOR | SUBCUTANEOUS | 1 refills | Status: DC

## 2022-10-05 ENCOUNTER — Other Ambulatory Visit: Payer: Self-pay | Admitting: Internal Medicine

## 2022-10-05 DIAGNOSIS — E118 Type 2 diabetes mellitus with unspecified complications: Secondary | ICD-10-CM

## 2022-10-05 DIAGNOSIS — I2510 Atherosclerotic heart disease of native coronary artery without angina pectoris: Secondary | ICD-10-CM

## 2022-10-20 DIAGNOSIS — L821 Other seborrheic keratosis: Secondary | ICD-10-CM | POA: Diagnosis not present

## 2022-10-20 DIAGNOSIS — L570 Actinic keratosis: Secondary | ICD-10-CM | POA: Diagnosis not present

## 2022-10-21 ENCOUNTER — Other Ambulatory Visit: Payer: Self-pay | Admitting: Internal Medicine

## 2022-10-21 DIAGNOSIS — I10 Essential (primary) hypertension: Secondary | ICD-10-CM

## 2022-10-21 DIAGNOSIS — I110 Hypertensive heart disease with heart failure: Secondary | ICD-10-CM

## 2022-11-09 LAB — HM DIABETES EYE EXAM

## 2022-11-15 DIAGNOSIS — H43813 Vitreous degeneration, bilateral: Secondary | ICD-10-CM | POA: Diagnosis not present

## 2022-11-16 ENCOUNTER — Other Ambulatory Visit: Payer: Self-pay | Admitting: Internal Medicine

## 2022-11-16 DIAGNOSIS — I10 Essential (primary) hypertension: Secondary | ICD-10-CM

## 2022-11-16 DIAGNOSIS — I110 Hypertensive heart disease with heart failure: Secondary | ICD-10-CM

## 2022-11-18 ENCOUNTER — Other Ambulatory Visit: Payer: Self-pay | Admitting: Cardiology

## 2022-11-18 DIAGNOSIS — I25118 Atherosclerotic heart disease of native coronary artery with other forms of angina pectoris: Secondary | ICD-10-CM

## 2022-11-18 DIAGNOSIS — E785 Hyperlipidemia, unspecified: Secondary | ICD-10-CM

## 2022-12-14 DIAGNOSIS — L570 Actinic keratosis: Secondary | ICD-10-CM | POA: Diagnosis not present

## 2022-12-14 DIAGNOSIS — S0181XA Laceration without foreign body of other part of head, initial encounter: Secondary | ICD-10-CM | POA: Diagnosis not present

## 2022-12-22 ENCOUNTER — Ambulatory Visit (INDEPENDENT_AMBULATORY_CARE_PROVIDER_SITE_OTHER): Payer: Medicare HMO | Admitting: Family Medicine

## 2022-12-22 ENCOUNTER — Encounter: Payer: Self-pay | Admitting: Family Medicine

## 2022-12-22 VITALS — BP 90/60 | HR 84 | Temp 97.7°F | Ht 60.0 in | Wt 132.2 lb

## 2022-12-22 DIAGNOSIS — I5032 Chronic diastolic (congestive) heart failure: Secondary | ICD-10-CM

## 2022-12-22 DIAGNOSIS — E118 Type 2 diabetes mellitus with unspecified complications: Secondary | ICD-10-CM | POA: Diagnosis not present

## 2022-12-22 DIAGNOSIS — I10 Essential (primary) hypertension: Secondary | ICD-10-CM

## 2022-12-22 DIAGNOSIS — R42 Dizziness and giddiness: Secondary | ICD-10-CM | POA: Diagnosis not present

## 2022-12-22 DIAGNOSIS — D500 Iron deficiency anemia secondary to blood loss (chronic): Secondary | ICD-10-CM | POA: Diagnosis not present

## 2022-12-22 LAB — CBC WITH DIFFERENTIAL/PLATELET
Basophils Absolute: 0 10*3/uL (ref 0.0–0.1)
Basophils Relative: 0.4 % (ref 0.0–3.0)
Eosinophils Absolute: 0.1 10*3/uL (ref 0.0–0.7)
Eosinophils Relative: 1.1 % (ref 0.0–5.0)
HCT: 36.7 % (ref 36.0–46.0)
Hemoglobin: 12.2 g/dL (ref 12.0–15.0)
Lymphocytes Relative: 13.3 % (ref 12.0–46.0)
Lymphs Abs: 0.8 10*3/uL (ref 0.7–4.0)
MCHC: 33.2 g/dL (ref 30.0–36.0)
MCV: 96.4 fL (ref 78.0–100.0)
Monocytes Absolute: 0.5 10*3/uL (ref 0.1–1.0)
Monocytes Relative: 7.9 % (ref 3.0–12.0)
Neutro Abs: 4.5 10*3/uL (ref 1.4–7.7)
Neutrophils Relative %: 77.3 % — ABNORMAL HIGH (ref 43.0–77.0)
Platelets: 221 10*3/uL (ref 150.0–400.0)
RBC: 3.81 Mil/uL — ABNORMAL LOW (ref 3.87–5.11)
RDW: 12.3 % (ref 11.5–15.5)
WBC: 5.8 10*3/uL (ref 4.0–10.5)

## 2022-12-22 NOTE — Progress Notes (Signed)
Acute Office Visit  Subjective:     Patient ID: Tonya [name], female    DOB: 30-Apr-1950, 72 y.o.   MRN: [id number]  Chief Complaint  Patient presents with   Dizziness    Experiencing dizziness for the past couple of weeks. When having one dizzy spell PT had fell and injured their forehead. Notes it happens mostly when first standing up and walking a couple of steps.   Loss of Consciousness    Notes it feels as though she is about to pass out    Dizziness  Loss of Consciousness Associated symptoms include dizziness.   Patient is in today for evaluation of dizziness with position changes, worse when sitting to standing and beginning to take steps.  Has been going on for a few weeks. She has fallen due to the dizziness and hit her head between her eyes on the corner of a table x 9 days ago. Denies LOC with fall, is not taking blood thinners. Has steri strips that were placed by derm the next day. Has complex PMH as outlined below.  Review of Systems  Cardiovascular:  Positive for syncope.  Neurological:  Positive for dizziness.   Per HPI      Objective:    BP 90/60 (BP Location: Left Arm, Patient Position: Sitting)   Pulse 84   Temp 97.7 F (36.5 C) (Oral)   Ht 5' (1.524 m)   Wt 132 lb 3.2 oz (60 kg)   SpO2 97%   BMI 25.82 kg/m    Physical Exam Vitals and nursing note reviewed.  Constitutional:      General: She is not in acute distress.    Appearance: Normal appearance. She is normal weight.  HENT:     Head: Normocephalic. Abrasion (steri strips in place) present.      Comments: Mild bruising to R orbit. Non tender Eyes:     Extraocular Movements: Extraocular movements intact.     Pupils: Pupils are equal, round, and reactive to light.  Cardiovascular:     Rate and Rhythm: Normal rate and regular rhythm.     Pulses: Normal pulses.     Heart sounds: Normal heart sounds.  Pulmonary:     Effort: Pulmonary effort is normal.     Breath sounds: Normal  breath sounds. No wheezing.  Musculoskeletal:        General: Normal range of motion.     Cervical back: Normal range of motion.     Right lower leg: No edema.     Left lower leg: No edema.  Skin:    General: Skin is warm and dry.     Capillary Refill: Capillary refill takes less than 2 seconds.  Neurological:     General: No focal deficit present.     Mental Status: She is alert and oriented to person, place, and time.  Psychiatric:        Mood and Affect: Mood normal.        Thought Content: Thought content normal.     No results found for any visits on 12/22/22.      Assessment & Plan:  1. Dizziness (Primary)  - CBC with Differential/Platelet - Basic metabolic panel  2. Iron deficiency anemia due to chronic blood loss  - CBC with Differential/Platelet  3. Primary hypertension  - CBC with Differential/Platelet - Basic metabolic panel  4. Chronic heart failure with preserved ejection fraction (HCC)  - Basic metabolic panel  5. Type II diabetes mellitus with  manifestations (HCC)  - Basic metabolic panel  Stop the olmesartan.  Check BP at home once daily in the mornings for the next month. Follow up with me in a month to recheck blood pressures.  May wear compression socks to help stabilize blood pressure.  Make sure you are drinking plenty of fluids.  Take your time changing positions and take a couple of steps in place before you take off walking to help stabilize your blood pressure  We are checking labs today, will be in contact with any results that require further attention   No orders of the defined types were placed in this encounter.   Return in about 1 month (around 01/22/2023) for BP recheck.  Moshe Cipro, FNP

## 2022-12-22 NOTE — Patient Instructions (Signed)
Stop the olmesartan.  Check BP at home once daily in the mornings for the next month. Follow up with me in a month to recheck blood pressures.  May wear compression socks to help stabilize blood pressure.  Make sure you are drinking plenty of fluids.  Take your time changing positions and take a couple of steps in place before you take off walking to help stabilize your blood pressure  We are checking labs today, will be in contact with any results that require further attention

## 2022-12-23 LAB — BASIC METABOLIC PANEL WITH GFR
BUN: 22 mg/dL (ref 7–25)
CO2: 26 mmol/L (ref 20–32)
Calcium: 9.1 mg/dL (ref 8.6–10.4)
Chloride: 107 mmol/L (ref 98–110)
Creat: 0.77 mg/dL (ref 0.60–1.00)
Glucose, Bld: 85 mg/dL (ref 65–99)
Potassium: 4 mmol/L (ref 3.5–5.3)
Sodium: 141 mmol/L (ref 135–146)

## 2022-12-24 ENCOUNTER — Encounter: Payer: Self-pay | Admitting: Family Medicine

## 2023-01-12 ENCOUNTER — Telehealth: Payer: Self-pay | Admitting: Internal Medicine

## 2023-01-12 NOTE — Telephone Encounter (Signed)
 Copied from CRM [id number]. Topic: Clinical - Prescription Issue >> Jan 12, 2023 10:58 AM Kara C wrote: Reason for CRM: The patients insurance has called in to inform the provider, the prior authorization submitted on behalf of the patient for the Ozempic  injection medication has been denied. The patient has been notified of the denial. The insurance is going to fax the denial letter to the provider, the provider also has the right to appeal the decision. If the provider has any questions please follow up wit [employer] [phone]

## 2023-01-12 NOTE — Telephone Encounter (Signed)
 Would you like me to call the patient with the denial or are we going to try something else ?

## 2023-01-13 NOTE — Telephone Encounter (Signed)
 Patient is calling back in regards to her medication she wanted to know if she could come and pick up samples if the medication she would like a call back regarding this matter

## 2023-01-14 NOTE — Telephone Encounter (Signed)
**Note De-identified  [place] Obfuscation** Please advise 

## 2023-01-14 NOTE — Telephone Encounter (Signed)
 Copied from CRM [id number]. Topic: Clinical - Prescription Issue >> Jan 14, 2023 10:46 AM Tonya [name] wrote: Reason for CRM: Pt stated that she is needing a sample or her Ozempic  1.5mg  due to she is not able to receive it from the pharmacy because her insurance wont cover it.

## 2023-01-17 ENCOUNTER — Encounter: Payer: Self-pay | Admitting: Family Medicine

## 2023-01-18 ENCOUNTER — Other Ambulatory Visit (HOSPITAL_COMMUNITY): Payer: Self-pay

## 2023-01-18 ENCOUNTER — Encounter: Payer: Self-pay | Admitting: Hematology

## 2023-01-18 ENCOUNTER — Telehealth: Payer: Self-pay | Admitting: Internal Medicine

## 2023-01-18 NOTE — Telephone Encounter (Signed)
 Copied from CRM [id number]. Topic: Clinical - Medication Question >> Jan 18, 2023 10:24 AM Merlynn A wrote: Reason for CRM: Patient called in regarding samples for Ozempic . Patient stated that someone called her but there are no notes showing any call to patient. Please contact patient to discuss as she is already two days without medication. Patient can be reached at [phone].

## 2023-01-19 ENCOUNTER — Encounter: Payer: Self-pay | Admitting: Hematology

## 2023-01-19 ENCOUNTER — Telehealth: Payer: Self-pay

## 2023-01-19 NOTE — Telephone Encounter (Signed)
 duplicate

## 2023-01-19 NOTE — Telephone Encounter (Signed)
 Copied from CRM [id number]. Topic: Clinical - Home Health Verbal Orders >> Jan 19, 2023  1:28 PM Margarette Shawl wrote: Caller/Agency: Liane Redman [facility] Callback Number: [phone] Service Requested: Requesting more information on patient, concerning the insurance appeal for coverage of her Ozempic . Faxed form over yesterday to clinic to be completed and checking on progress. They require the additional information: Patient's diagnoses. How long has the patient been taking Ozempic , or if this is a new medication. Does she have family/personal history of medullary fibroid carcinoma or multiple endocrine neoplasia type 2. Will patient be taking Ozempic  concurrently with another GLP-1 inhibitor or DPP-4 inhibitor. Frequency: N/A Any new concerns about the patient? No

## 2023-01-19 NOTE — Telephone Encounter (Signed)
 Copied from CRM [id number]. Topic: General - Other >> Jan 19, 2023  3:33 PM Marlan Silva wrote: Reason for CRM: Patient called in stating that [employer] faxed over some paper work for the patients' Ozempic . Patient is upset and said she needs that paper work filled out before her upcoming appointment. Patient also states she was supposed to get a call about ozempic  samples from the office.

## 2023-01-20 NOTE — Telephone Encounter (Signed)
Copied from CRM [id number]. Topic: Clinical - Prescription Issue >> Jan 20, 2023 12:35 PM Corin V wrote: Reason for CRM: Patient's insurance called regarding the Ozempic Rx. Per [employer] the Rx was submitted and denied twice and the patient appealed it both times and it is still not approved. She stated the patient was on her line crying about not being able to get this prescription approved. [facility] stated that Dr. Yetta Barre can file for an expedited appeal by faxing the appeal to [phone] or electronically at [email or web address] Please advise patient if appeal will be completed and when it is done. [employer] also mentioned patient had stated she was not feeling well and needed this medication. Agent advised [employer] to instruct patient to call office after they completed their call so that patient could be triaged if needed and symptoms could be reported to Dr. Yetta Barre.

## 2023-01-20 NOTE — Telephone Encounter (Signed)
Not sure quite yet. Most of these interactions have all been through CRM so it's a bit foggy. Earliest interaction I've seen for all of this was the beginning of the message thread on 01/12/23. Stating that the insurance had called and said it was denied.

## 2023-01-20 NOTE — Telephone Encounter (Signed)
Spoke with Insurance claims handler at [facility], states denial still stands. Patient may take the next steps if she wishes, however she does not meet the criteria for Ozempic. Since insurance will not cover, samples may not be provided.

## 2023-01-21 ENCOUNTER — Telehealth: Payer: Self-pay

## 2023-01-21 ENCOUNTER — Telehealth: Payer: Self-pay | Admitting: Pharmacist

## 2023-01-21 ENCOUNTER — Ambulatory Visit (INDEPENDENT_AMBULATORY_CARE_PROVIDER_SITE_OTHER): Payer: Medicare Other | Admitting: Family Medicine

## 2023-01-21 ENCOUNTER — Encounter: Payer: Self-pay | Admitting: Family Medicine

## 2023-01-21 ENCOUNTER — Other Ambulatory Visit (HOSPITAL_COMMUNITY): Payer: Self-pay

## 2023-01-21 VITALS — BP 110/78 | HR 80 | Temp 98.3°F | Ht 60.0 in | Wt 133.0 lb

## 2023-01-21 DIAGNOSIS — E118 Type 2 diabetes mellitus with unspecified complications: Secondary | ICD-10-CM | POA: Diagnosis not present

## 2023-01-21 DIAGNOSIS — I2510 Atherosclerotic heart disease of native coronary artery without angina pectoris: Secondary | ICD-10-CM | POA: Diagnosis not present

## 2023-01-21 DIAGNOSIS — I5032 Chronic diastolic (congestive) heart failure: Secondary | ICD-10-CM

## 2023-01-21 MED ORDER — OZEMPIC (1 MG/DOSE) 4 MG/3ML ~~LOC~~ SOPN: 1.0000 mg | PEN_INJECTOR | SUBCUTANEOUS | 3 refills | Status: DC

## 2023-01-21 NOTE — Progress Notes (Signed)
Established Patient Office Visit  Subjective   Patient ID: Tonya [name], female    DOB: [birth year]  Age: 73 y.o. MRN: [id number]  Chief Complaint  Patient presents with   Dizziness    1 month follow up. Notes dizziness has completely subsided and believes blood pressure has stabilized since being taken off the olmesartan. Would like to discuss ozempic script.    Dizziness   72 year old female following up for hypotension and dizziness. At her last visit, we stopped olmesartan.  States this has made a world of difference, she is feeling so much better.  States blood pressures have not been low at home, in the 110s/70s. No falls since last visit. She is also inquiring about her Ozempic prescription.  States that she has been a couple days without it, really needs to have it back, she is not feeling great without it. Medical history includes type 2 diabetes, CAD. This is not a new medication. Denies family history and personal history of medullary thyroid carcinomas. Denies family and personal history of multiple endocrine neoplasia type II's. She will not be taking another GLP-1 or DPP 4 inhibitor along with Ozempic.  New denies other concerns today. Medical history as outlined below.    Review of Systems  Neurological:  Positive for dizziness.   Per HPI    Objective:     BP 110/78   Pulse 80   Temp 98.3 F (36.8 C)   Ht 5' (1.524 m)   Wt 133 lb (60.3 kg)   SpO2 98%   BMI 25.97 kg/m   Physical Exam Vitals and nursing note reviewed.  Constitutional:      General: She is not in acute distress.    Appearance: Normal appearance. She is normal weight.  HENT:     Head: Normocephalic and atraumatic.     Nose: Nose normal.  Eyes:     Extraocular Movements: Extraocular movements intact.     Pupils: Pupils are equal, round, and reactive to light.  Cardiovascular:     Rate and Rhythm: Normal rate and regular rhythm.     Heart sounds: Normal heart sounds.   Pulmonary:     Effort: Pulmonary effort is normal. No respiratory distress.     Breath sounds: Normal breath sounds. No wheezing, rhonchi or rales.  Musculoskeletal:        General: Normal range of motion.     Cervical back: Normal range of motion.  Neurological:     General: No focal deficit present.     Mental Status: She is alert and oriented to person, place, and time.  Psychiatric:        Mood and Affect: Mood normal.        Thought Content: Thought content normal.      The ASCVD Risk score (Arnett DK, et al., 2019) failed to calculate for the following reasons:   Risk score cannot be calculated because patient has a medical history suggesting prior/existing ASCVD    Assessment & Plan:   Type II diabetes mellitus with manifestations (HCC) -     Ozempic (1 MG/DOSE); Inject 1 mg into the skin once a week.  Dispense: 3 mL; Refill: 3  Coronary artery disease involving native coronary artery of native heart without angina pectoris -     Ozempic (1 MG/DOSE); Inject 1 mg into the skin once a week.  Dispense: 3 mL; Refill: 3  Chronic heart failure with preserved ejection fraction (HCC) -     Ozempic (  1 MG/DOSE); Inject 1 mg into the skin once a week.  Dispense: 3 mL; Refill: 3   -Having trouble getting Ozempic approved since changing insurance -Requiring PA, pending, she has missed 1 dose of her medication -Pending [facility] referral as well -Has been on Ozmepic since 07/2022, stable on 1mg . No consitpation, nausea, vomiting, pancreatitis hx, no adverse effects noted.  Samples of Ozempic 0.5mg  (2 doses) given in office today   Return for PCP as scheduled.    Moshe Cipro, FNP

## 2023-01-21 NOTE — Telephone Encounter (Signed)
Called and spoke with patient to update her on current status of prescription.

## 2023-01-21 NOTE — Telephone Encounter (Signed)
The Patient was seen in office today by Moshe Cipro and was given samples of ozempic. She was given 2 samples of Ozempic 0.5mg 

## 2023-01-21 NOTE — Telephone Encounter (Signed)
An expedited appeal has been submitted for Ozempic. Will advise when response is received.  The appeal letter and supporting documentation has been faxed to [phone] on 01/21/2023 @12 :35 pm.  Thank you, Dellie Burns, PharmD Clinical Pharmacist  [place]  Direct Dial: [phone]

## 2023-01-21 NOTE — Telephone Encounter (Signed)
Pharmacy Patient Advocate Encounter  Received notification from [facility] that Prior Authorization for Ozempic has been DENIED.  Full denial letter will be uploaded to the media tab. See denial reason below.

## 2023-01-21 NOTE — Patient Instructions (Signed)
I have given you samples of Ozempic 0.5 mg today.  I have given you 2 doses of the 0.5 mg.  You may take both of them today to equal your 1 mg dose.  I have answered the questions for the prior authorization, also renewed the prescription and updated the diagnosis codes.  Hopefully this will help get you back on track.  Follow-up with your PCP as scheduled

## 2023-01-25 ENCOUNTER — Other Ambulatory Visit (HOSPITAL_COMMUNITY): Payer: Self-pay

## 2023-01-26 ENCOUNTER — Encounter: Payer: Self-pay | Admitting: Family Medicine

## 2023-01-26 ENCOUNTER — Ambulatory Visit: Payer: Medicare Other | Admitting: Internal Medicine

## 2023-01-26 ENCOUNTER — Other Ambulatory Visit (HOSPITAL_COMMUNITY): Payer: Self-pay

## 2023-01-28 ENCOUNTER — Encounter: Payer: Self-pay | Admitting: Family Medicine

## 2023-01-28 NOTE — Telephone Encounter (Signed)
Pharmacy Patient Advocate Encounter  Received notification from [facility] that Appeal for Ozempic has been DENIED.  Full denial letter will be uploaded to the media tab. See denial reason below.   PA #/Case ID/Reference #: [id number]

## 2023-01-28 NOTE — Telephone Encounter (Unsigned)
Copied from CRM [id number]. Topic: Clinical - Prescription Issue >> Jan 28, 2023 11:17 AM Drema Balzarine wrote: Reason for EAV:[facility] receive the appeal for patient Tonya [name] but that need clinical information sent over to support the appeal. Please fax to [phone].

## 2023-01-28 NOTE — Telephone Encounter (Signed)
Continuing this matter in patient message encounter.

## 2023-01-31 ENCOUNTER — Other Ambulatory Visit (HOSPITAL_COMMUNITY): Payer: Self-pay

## 2023-02-01 ENCOUNTER — Telehealth: Payer: Self-pay | Admitting: Internal Medicine

## 2023-02-01 NOTE — Telephone Encounter (Signed)
Copied from CRM [id number]. Topic: Clinical - Prescription Issue >> Feb 01, 2023  3:28 PM Orinda Kenner C wrote: Reason for CRM: Lawson Fiscal from [place] [facility] [phone] is following up on patient's Ozempic. If the office needs any information, Lawson Fiscal will happily fax any information over. Please call back.

## 2023-02-01 NOTE — Telephone Encounter (Signed)
The appeal was denied by [facility] with the following explanation:    The full denial letter has been added to the Media Tab.

## 2023-02-08 ENCOUNTER — Encounter: Payer: Self-pay | Admitting: Family Medicine

## 2023-02-22 ENCOUNTER — Encounter: Payer: Self-pay | Admitting: Hematology

## 2023-02-23 ENCOUNTER — Ambulatory Visit (INDEPENDENT_AMBULATORY_CARE_PROVIDER_SITE_OTHER): Payer: Medicare Other

## 2023-02-23 VITALS — Ht 60.0 in | Wt 133.0 lb

## 2023-02-23 DIAGNOSIS — Z0000 Encounter for general adult medical examination without abnormal findings: Secondary | ICD-10-CM | POA: Diagnosis not present

## 2023-02-23 DIAGNOSIS — Z13820 Encounter for screening for osteoporosis: Secondary | ICD-10-CM

## 2023-02-23 DIAGNOSIS — Z1231 Encounter for screening mammogram for malignant neoplasm of breast: Secondary | ICD-10-CM

## 2023-02-23 NOTE — Progress Notes (Cosign Needed Addendum)
 Subjective:  Please attest and cosign this visit due to patients primary care provider not being in the office at the time the visit was completed.     Tonya [name] is a 73 y.o. female who presents for Medicare Annual (Subsequent) preventive examination.  Visit Complete: Virtual I connected with  Tonya [name] on 02/23/23 by a audio enabled telemedicine application and verified that I am speaking with the correct person using two identifiers.  Patient Location: Home  Provider Location: Office/Clinic  I discussed the limitations of evaluation and management by telemedicine. The patient expressed understanding and agreed to proceed.  Vital Signs: Because this visit was a virtual/telehealth visit, some criteria may be missing or patient reported. Any vitals not documented were not able to be obtained and vitals that have been documented are patient reported. Cardiac Risk Factors include: advanced age (>71men, >45 women);diabetes mellitus;dyslipidemia;hypertension     Objective:    Today's Vitals   02/23/23 1403  Weight: 133 lb (60.3 kg)  Height: 5' (1.524 m)   Body mass index is 25.97 kg/m.     02/23/2023    2:02 PM 10/20/2021    9:08 AM 09/26/2020    5:16 PM 08/26/2020    6:04 AM 08/02/2019    8:36 AM 07/14/2019    9:09 AM 04/05/2019   11:29 AM  Advanced Directives  Does Patient Have a Medical Advance Directive? Yes Yes Yes Yes No No Yes  Type of Estate agent of [place];Living will Healthcare Power of [place];Living will;Out of facility DNR (pink MOST or yellow form) Healthcare Power of [employer] of [place];Living will     Does patient want to make changes to medical advance directive? No - Patient declined No - Patient declined  No - Patient declined     Copy of Healthcare Power of Attorney in Chart? Yes - validated most recent copy scanned in chart (See row information) Yes - validated most recent copy scanned in chart (See  row information) Yes - validated most recent copy scanned in chart (See row information)      Would patient like information on creating a medical advance directive?     No - Patient declined      Current Medications (verified) Outpatient Encounter Medications as of 02/23/2023  Medication Sig   acetaminophen (TYLENOL) 500 MG tablet Take 1,000 mg by mouth every 8 (eight) hours as needed for moderate pain.   aspirin 81 MG chewable tablet Chew 1 tablet (81 mg total) by mouth daily.   blood glucose meter kit and supplies KIT Dispense based on patient and insurance preference. Use up to four times daily as directed.   cholecalciferol (VITAMIN D) 25 MCG (1000 UNIT) tablet Take 1,000 Units by mouth daily.   ferrous sulfate 325 (65 FE) MG tablet Take 324 mg by mouth daily with breakfast.   Insulin Pen Needle 32G X 6 MM MISC 1 Act by Does not apply route once a week.   Lancets (ONETOUCH DELICA PLUS LANCET33G) MISC    Multiple Vitamin (MULTIVITAMIN) tablet Take 1 tablet by mouth daily.   rosuvastatin (CRESTOR) 20 MG tablet TAKE 1 TABLET BY MOUTH EVERY DAY   Semaglutide, 1 MG/DOSE, (OZEMPIC, 1 MG/DOSE,) 4 MG/3ML SOPN Inject 1 mg into the skin once a week.   No facility-administered encounter medications on file as of 02/23/2023.    Allergies (verified) Bactrim [sulfamethoxazole-trimethoprim], Phenergan [promethazine hcl], Sulfa antibiotics, and Penicillins   History: Past Medical History:  Diagnosis Date  Anemia    few yrs ago   C. difficile diarrhea 03/2018   Cancer (HCC)    rectal   CHF (congestive heart failure) (HCC)    Coronary artery disease 07/2003   occluded LAD wtih right-to-left collaterals 07/24/03; no current cardiologist   Diabetes mellitus without complication (HCC)    Elbow fracture, left    History of chemotherapy 01/2018   radaition last tx jan 2019   Myocardial infarction [facility]) age 59's   Obesity 2003   s/p gastric bypass    Primary hypertension 07/30/2020   Vitamin D  deficiency    Wears glasses    Past Surgical History:  Procedure Laterality Date   ANAL RECTAL MANOMETRY N/A 11/01/2018   Procedure: ANO RECTAL MANOMETRY;  Surgeon: Romie Levee, MD;  Location: [facility];  Service: Endoscopy;  Laterality: N/A;   CHOLECYSTECTOMY  1999   CORONARY CTO INTERVENTION N/A 08/26/2020   Procedure: CORONARY CTO INTERVENTION;  Surgeon: Yates Decamp, MD;  Location: [facility] IN[facility];  Service: Cardiovascular;  Laterality: N/A;   CORONARY STENT INTERVENTION N/A 08/26/2020   Procedure: CORONARY STENT INTERVENTION;  Surgeon: Yates Decamp, MD;  Location: [facility] CV [facility];  Service: Cardiovascular;  Laterality: N/A;   GASTRIC BYPASS  2003   IR FLUORO GUIDE PORT INSERTION RIGHT  01/25/2017   IR REMOVAL TUN ACCESS W/ PORT W/O FL MOD SED  10/17/2018   IR US GUIDE VASC ACCESS RIGHT  01/25/2017   LEFT HEART CATH AND CORONARY ANGIOGRAPHY N/A 08/26/2020   Procedure: LEFT HEART CATH AND CORONARY ANGIOGRAPHY;  Surgeon: Yates Decamp, MD;  Location: [facility];  Service: Cardiovascular;  Laterality: N/A;   ORIF HUMERUS FRACTURE Left 08/02/2019   Procedure: OPEN REDUCTION INTERNAL FIXATION (ORIF) LEFT ELBOW FRACTURE.;  Surgeon: Myrene Galas, MD;  Location: [facility] OR;  Service: Orthopedics;  Laterality: Left;   ostomy placed and removed  12/2017   SACRAL NERVE STIMULATOR PLACEMENT  01-12-2019   DR Maisie Fus @[facility]    TEST PHASE   Family History  Problem Relation Age of Onset   Heart attack Mother        d.93   Throat cancer Mother 76   Clotting disorder Mother    Liver disease Mother    Kidney disease Mother    Heart attack Father        d.92   Prostate cancer Father 51   Uterine cancer Sister 61       treated with total hysterectomy and radiation   Thyroid cancer Sister 34       treated with thyroidectomy   Uterine cancer Sister 58   Cancer Maternal Uncle        d.80s unspecified type of cancer. History of smoking.   Colon cancer Cousin 56       paternal  first-cousin   Social History   Socioeconomic History   Marital status: Single    Spouse name: Not on file   Number of children: 0   Years of education: Not on file   Highest education level: Not on file  Occupational History   Occupation: retired  Tobacco Use   Smoking status: Never   Smokeless tobacco: Never  Vaping Use   Vaping status: Never Used  Substance and Sexual Activity   Alcohol use: No   Drug use: No   Sexual activity: Not on file  Other Topics Concern   Not on file  Social History Narrative   Not on file   Social Drivers of  Health   Financial Resource Strain: Low Risk  (02/23/2023)   Overall Financial Resource Strain (CARDIA)    Difficulty of Paying Living Expenses: Not hard at all  Food Insecurity: No Food Insecurity (02/23/2023)   Hunger Vital Sign    Worried About Running Out of Food in the Last Year: Never true    Ran Out of Food in the Last Year: Never true  Transportation Needs: No Transportation Needs (02/23/2023)   PRAPARE - Administrator, Civil Service (Medical): No    Lack of Transportation (Non-Medical): No  Physical Activity: Inactive (02/23/2023)   Exercise Vital Sign    Days of Exercise per Week: 0 days    Minutes of Exercise per Session: 0 min  Stress: No Stress Concern Present (02/23/2023)   [employer] of Occupational Health - Occupational Stress Questionnaire    Feeling of Stress : Not at all  Social Connections: Socially Isolated (02/23/2023)   Social Connection and Isolation Panel [NHANES]    Frequency of Communication with Friends and Family: More than three times a week    Frequency of Social Gatherings with Friends and Family: More than three times a week    Attends Religious Services: Never    Database administrator or Organizations: No    Attends Engineer, structural: Never    Marital Status: Never married    Tobacco Counseling Counseling given: Not Answered   Clinical Intake:  Pre-visit  preparation completed: Yes  Pain : No/denies pain     BMI - recorded: 25.97 Nutritional Status: BMI 25 -29 Overweight Nutritional Risks: None Diabetes: Yes (had Bariatric surgery in 2008 - blood glucose levels have been  within range per pt) CBG done?: No Did pt. bring in CBG monitor from home?: No  How often do you need to have someone help you when you read instructions, pamphlets, or other written materials from your doctor or pharmacy?: 1 - Never  Interpreter Needed?: No  Information entered by :: Hassell Halim, CMA   Activities of Daily Living    02/23/2023    2:08 PM  In your present state of health, do you have any difficulty performing the following activities:  Hearing? 0  Vision? 0  Difficulty concentrating or making decisions? 0  Walking or climbing stairs? 0  Dressing or bathing? 0  Doing errands, shopping? 0  Preparing Food and eating ? N  Using the Toilet? N  In the past six months, have you accidently leaked urine? N  Do you have problems with loss of bowel control? N  Managing your Medications? N  Managing your Finances? N  Housekeeping or managing your Housekeeping? N    Patient Care Team: Etta Grandchild, MD as PCP - General (Internal Medicine) Hilarie Fredrickson, MD as Consulting Physician (Gastroenterology) Romie Levee, MD as Consulting Physician (General Surgery) Malachy Mood, MD as Consulting Physician (Hematology) Dorothy Puffer, MD as Consulting Physician (Radiation Oncology)  Indicate any recent Medical Services you may have received from other than [facility] providers in the past year (date may be approximate).     Assessment:   This is a routine wellness examination for Tonya [name].  Hearing/Vision screen Hearing Screening - Comments:: Denies hearing difficulties   Vision Screening - Comments:: Wears eyeglasses - eye exam 1-2 mths ago per pt   Goals Addressed               This Visit's Progress     Patient Stated (pt-stated)  Patient  stated that she plans to continue doing what she's suppose to do daily.       Depression Screen    02/23/2023    2:14 PM 10/20/2021    9:07 AM 06/19/2021   10:09 AM 02/20/2021    9:47 AM 09/26/2020    5:12 PM 07/30/2020    1:17 PM 05/28/2020    9:28 AM  PHQ 2/9 Scores  PHQ - 2 Score 0 0 0 0 0 0 0  PHQ- 9 Score   0 0   1    Fall Risk    02/23/2023    2:09 PM 12/22/2022    1:08 PM 10/20/2021    9:08 AM 06/19/2021   10:09 AM 09/26/2020    5:12 PM  Fall Risk   Falls in the past year? 0 1 0 0 0  Number falls in past yr: 0 0 0 0 0  Injury with Fall? 0 1 0 0 0  Risk for fall due to : No Fall Risks Impaired vision No Fall Risks    Risk for fall due to: Comment  PT experincing dizz spells     Follow up Falls prevention discussed;Falls evaluation completed Falls evaluation completed Falls prevention discussed      MEDICARE RISK AT HOME: Medicare Risk at Home Any stairs in or around the home?: Yes If so, are there any without handrails?: No Home free of loose throw rugs in walkways, pet beds, electrical cords, etc?: Yes Adequate lighting in your home to reduce risk of falls?: Yes Life alert?: No Use of a cane, walker or w/c?: No Grab bars in the bathroom?: Yes Shower chair or bench in shower?: Yes Elevated toilet seat or a handicapped toilet?: No  TIMED UP AND GO:  Was the test performed?  No    Cognitive Function:        02/23/2023    2:11 PM 10/20/2021    9:08 AM 09/26/2020    5:16 PM  6CIT Screen  What Year? 0 points 0 points 0 points  What month? 0 points 0 points 0 points  What time? 0 points 0 points 0 points  Count back from 20 0 points 0 points 0 points  Months in reverse 0 points 0 points 0 points  Repeat phrase 0 points 0 points 0 points  Total Score 0 points 0 points 0 points    Immunizations Immunization History  Administered Date(s) Administered   Fluad Quad(high Dose 65+) 10/02/2018   Fluad Trivalent(High Dose 65+) 09/09/2022   Influenza Inj Mdck  Quad With Preservative 01/21/2017   Influenza-Unspecified 09/10/2021   PFIZER Comirnaty(Gray Top)Covid-19 Tri-Sucrose Vaccine 09/30/2019, 05/17/2020   PFIZER(Purple Top)SARS-COV-2 Vaccination 02/08/2019, 03/05/2019, 09/30/2019, 05/17/2020, 10/25/2020   PNEUMOCOCCAL CONJUGATE-20 06/19/2021   Pneumococcal Polysaccharide-23 06/16/2017, 09/05/2019   Tdap 07/14/2019   Zoster Recombinant(Shingrix) 06/26/2021, 08/27/2021    TDAP status: Up to date - 07/14/2019  Flu Vaccine status: Up to date - 09/09/2022  Pneumococcal vaccine status: Up to date - 06/04/2021  Covid-19 vaccine status: Information provided on how to obtain vaccines.   Qualifies for Shingles Vaccine? Yes   Zostavax completed Yes   Shingrix Completed?: Yes - 08/27/2021  Screening Tests Health Maintenance  Topic Date Due   OPHTHALMOLOGY EXAM  Never done   COVID-19 Vaccine (8 - 2024-25 season) 09/05/2022   FOOT EXAM  10/21/2022   HEMOGLOBIN A1C  03/09/2023   Diabetic kidney evaluation - Urine ACR  09/09/2023   MAMMOGRAM  10/21/2023   Diabetic  kidney evaluation - eGFR measurement  12/22/2023   Medicare Annual Wellness (AWV)  02/23/2024   DTaP/Tdap/Td (2 - Td or Tdap) 07/13/2029   Pneumonia Vaccine 27+ Years old  Completed   INFLUENZA VACCINE  Completed   DEXA SCAN  Completed   Hepatitis C Screening  Completed   Zoster Vaccines- Shingrix  Completed   HPV VACCINES  Aged Out   Colonoscopy  Discontinued    Health Maintenance  Health Maintenance Due  Topic Date Due   OPHTHALMOLOGY EXAM  Never done   COVID-19 Vaccine (8 - 2024-25 season) 09/05/2022   FOOT EXAM  10/21/2022    Colorectal cancer screening: No longer required.   Mammogram status: Ordered 02/23/2023. Pt provided with contact info and advised to call to schedule appt.   Bone Density status: Ordered 02/23/2023. Pt provided with contact info and advised to call to schedule appt.    Additional Screening:  Hepatitis C Screening: does not qualify  Vision  Screening: Recommended annual ophthalmology exams for early detection of glaucoma and other disorders of the eye. Is the patient up to date with their annual eye exam?  Yes  Who is the provider or what is the name of the office in which the patient attends annual eye exams? Sees a Opthalmologist  If pt is not established with a provider, would they like to be referred to a provider to establish care? No .   Dental Screening: Recommended annual dental exams for proper oral hygiene  Diabetic Foot Exam: Diabetic Foot Exam: Overdue, Pt has been advised about the importance in completing this exam. Pt is scheduled for diabetic foot exam on 10/2023 w/PCP.  Community Resource Referral / Chronic Care Management: CRR required this visit?  No   CCM required this visit?  No     Plan:     I have personally reviewed and noted the following in the patient's chart:   Medical and social history Use of alcohol, tobacco or illicit drugs  Current medications and supplements including opioid prescriptions. Patient is not currently taking opioid prescriptions. Functional ability and status Nutritional status Physical activity Advanced directives List of other physicians Hospitalizations, surgeries, and ER visits in previous 12 months Vitals Screenings to include cognitive, depression, and falls Referrals and appointments  In addition, I have reviewed and discussed with patient certain preventive protocols, quality metrics, and best practice recommendations. A written personalized care plan for preventive services as well as general preventive health recommendations were provided to patient.     Darreld Mclean, CMA   02/23/2023   After Visit Summary: (MyChart) Due to this being a telephonic visit, the after visit summary with patients personalized plan was offered to patient via MyChart   Nurse Notes: ordered repeat Mammogram and DEXA scan. Pt is due for Physical w/PCP in 10/2023.   Medical  screening examination/treatment/procedure(s) were performed by non-physician practitioner and as supervising physician I was immediately available for consultation/collaboration.  I agree with above. Jacinta Shoe, MD

## 2023-02-23 NOTE — Patient Instructions (Addendum)
Tonya [name] , Thank you for taking time to come for your Medicare Wellness Visit. I appreciate your ongoing commitment to your health goals. Please review the following plan we discussed and let me know if I can assist you in the future.   Referrals/Orders/Follow-Ups/Clinician Recommendations: Aim for 30 minutes of exercise or brisk walking, 6-8 glasses of water, and 5 servings of fruits and vegetables each day. DEXA scan and Mammogram due in 2025 (order placed).    This is a list of the screening recommended for you and due dates:  Health Maintenance  Topic Date Due   Eye exam for diabetics  Never done   COVID-19 Vaccine (8 - 2024-25 season) 09/05/2022   Complete foot exam   10/21/2022   Hemoglobin A1C  03/09/2023   Yearly kidney health urinalysis for diabetes  09/09/2023   Mammogram  10/21/2023   Yearly kidney function blood test for diabetes  12/22/2023   Medicare Annual Wellness Visit  02/23/2024   DTaP/Tdap/Td vaccine (2 - Td or Tdap) 07/13/2029   Pneumonia Vaccine  Completed   Flu Shot  Completed   DEXA scan (bone density measurement)  Completed   Hepatitis C Screening  Completed   Zoster (Shingles) Vaccine  Completed   HPV Vaccine  Aged Out   Colon Cancer Screening  Discontinued    Advanced directives: (In Chart) A copy of your advanced directives are scanned into your chart should your provider ever need it.  Next Medicare Annual Wellness Visit scheduled for next year: Yes - 10/2024

## 2023-03-02 ENCOUNTER — Other Ambulatory Visit: Payer: Self-pay | Admitting: Cardiology

## 2023-03-02 DIAGNOSIS — I25118 Atherosclerotic heart disease of native coronary artery with other forms of angina pectoris: Secondary | ICD-10-CM

## 2023-03-02 DIAGNOSIS — E785 Hyperlipidemia, unspecified: Secondary | ICD-10-CM

## 2023-03-23 ENCOUNTER — Ambulatory Visit
Admission: RE | Admit: 2023-03-23 | Discharge: 2023-03-23 | Disposition: A | Discharge status: Home / Self Care | Payer: Medicare Other | Source: Ambulatory Visit | Attending: Internal Medicine | Admitting: Internal Medicine

## 2023-03-23 DIAGNOSIS — Z1231 Encounter for screening mammogram for malignant neoplasm of breast: Secondary | ICD-10-CM

## 2023-03-23 DIAGNOSIS — L570 Actinic keratosis: Secondary | ICD-10-CM | POA: Diagnosis not present

## 2023-03-23 DIAGNOSIS — L578 Other skin changes due to chronic exposure to nonionizing radiation: Secondary | ICD-10-CM | POA: Diagnosis not present

## 2023-04-07 ENCOUNTER — Ambulatory Visit (INDEPENDENT_AMBULATORY_CARE_PROVIDER_SITE_OTHER)
Admission: RE | Admit: 2023-04-07 | Discharge: 2023-04-07 | Disposition: A | Discharge status: Home / Self Care | Source: Ambulatory Visit | Attending: Internal Medicine | Admitting: Internal Medicine

## 2023-04-07 DIAGNOSIS — Z13820 Encounter for screening for osteoporosis: Secondary | ICD-10-CM | POA: Diagnosis not present

## 2023-04-11 ENCOUNTER — Ambulatory Visit: Payer: Medicare HMO | Admitting: Cardiology

## 2023-06-01 ENCOUNTER — Other Ambulatory Visit: Payer: Self-pay | Admitting: Cardiology

## 2023-06-01 DIAGNOSIS — I25118 Atherosclerotic heart disease of native coronary artery with other forms of angina pectoris: Secondary | ICD-10-CM

## 2023-06-01 DIAGNOSIS — E785 Hyperlipidemia, unspecified: Secondary | ICD-10-CM

## 2023-06-27 DIAGNOSIS — L570 Actinic keratosis: Secondary | ICD-10-CM | POA: Diagnosis not present

## 2023-07-25 ENCOUNTER — Telehealth: Payer: Self-pay

## 2023-07-25 NOTE — Telephone Encounter (Signed)
 Copied from CRM [id number]. Topic: Clinical - Medication Question >> Jul 25, 2023 12:06 PM Viola F wrote: Reason for CRM: Patient has been trying to get in touch with Dr. Gordy Rounds office for a long time to get a refill for the rosuvastatin  (CRESTOR ) 20 MG tablet medication - she would like a new referral to another cardiologist (Dr. Redell Shallow) since she can never get in contact with Dr Rounds office. In the meantime, she would like to know if Dr. Joshua will fill this prescription. Please call her with an update at [phone] (M)

## 2023-07-26 ENCOUNTER — Other Ambulatory Visit: Payer: Self-pay

## 2023-07-26 DIAGNOSIS — I25118 Atherosclerotic heart disease of native coronary artery with other forms of angina pectoris: Secondary | ICD-10-CM

## 2023-07-26 DIAGNOSIS — E785 Hyperlipidemia, unspecified: Secondary | ICD-10-CM

## 2023-07-26 MED ORDER — ROSUVASTATIN CALCIUM 20 MG PO TABS: 20.0000 mg | ORAL_TABLET | Freq: Every day | ORAL | 1 refills | Status: DC

## 2023-07-26 NOTE — Telephone Encounter (Signed)
 Spoke with patient, states she did not know she had to choose between the two. Explained to patient she can see [facility] for acute issues, but for chronic things, she needs a specific PCP to follow those issues. She states she loves [facility] but will stick with Dr.Jones. Advised patient that Dr.Jones would most likely want to see her prior to placing a referral to a new cardiologist as she has not seen him in awhile. Patient fine with that, scheduled for 07/28. Month supply of Crestor  sent in, since patient is scheduled with PCP. Patient thankful for the assistance.

## 2023-07-26 NOTE — Telephone Encounter (Signed)
 Can you address this since she's been seeing you ?

## 2023-07-26 NOTE — Telephone Encounter (Signed)
 Copied from CRM [id number]. Topic: Referral - Request for Referral >> Jul 25, 2023 12:01 PM Viola F wrote: Did the patient discuss referral with their provider in the last year? Yes (If No - schedule appointment) (If Yes - send message)  Appointment offered? No  Type of order/referral and detailed reason for visit: Cardiologist   Preference of office, provider, location: No preference   If referral order, have you been seen by this specialty before? No (If Yes, this issue or another issue? When? Where?  Can we respond through MyChart? Yes >> Jul 25, 2023 12:03 PM Viola F wrote: Patient would like to be referred to Dr. Redell Shallow

## 2023-08-01 ENCOUNTER — Encounter: Payer: Self-pay | Admitting: Internal Medicine

## 2023-08-01 ENCOUNTER — Encounter: Payer: Self-pay | Admitting: Hematology

## 2023-08-01 ENCOUNTER — Ambulatory Visit (INDEPENDENT_AMBULATORY_CARE_PROVIDER_SITE_OTHER): Admitting: Internal Medicine

## 2023-08-01 VITALS — BP 132/74 | HR 81 | Temp 97.7°F | Resp 16 | Ht 60.0 in | Wt 125.0 lb

## 2023-08-01 DIAGNOSIS — I5032 Chronic diastolic (congestive) heart failure: Secondary | ICD-10-CM | POA: Diagnosis not present

## 2023-08-01 DIAGNOSIS — Z0001 Encounter for general adult medical examination with abnormal findings: Secondary | ICD-10-CM

## 2023-08-01 DIAGNOSIS — E785 Hyperlipidemia, unspecified: Secondary | ICD-10-CM

## 2023-08-01 DIAGNOSIS — I110 Hypertensive heart disease with heart failure: Secondary | ICD-10-CM | POA: Diagnosis not present

## 2023-08-01 DIAGNOSIS — E118 Type 2 diabetes mellitus with unspecified complications: Secondary | ICD-10-CM

## 2023-08-01 DIAGNOSIS — Z0000 Encounter for general adult medical examination without abnormal findings: Secondary | ICD-10-CM

## 2023-08-01 DIAGNOSIS — I25118 Atherosclerotic heart disease of native coronary artery with other forms of angina pectoris: Secondary | ICD-10-CM

## 2023-08-01 DIAGNOSIS — I2510 Atherosclerotic heart disease of native coronary artery without angina pectoris: Secondary | ICD-10-CM

## 2023-08-01 DIAGNOSIS — I10 Essential (primary) hypertension: Secondary | ICD-10-CM | POA: Diagnosis not present

## 2023-08-01 DIAGNOSIS — Z7985 Long-term (current) use of injectable non-insulin antidiabetic drugs: Secondary | ICD-10-CM

## 2023-08-01 LAB — BASIC METABOLIC PANEL WITH GFR
BUN: 18 mg/dL (ref 6–23)
CO2: 30 meq/L (ref 19–32)
Calcium: 9.5 mg/dL (ref 8.4–10.5)
Chloride: 105 meq/L (ref 96–112)
Creatinine, Ser: 0.72 mg/dL (ref 0.40–1.20)
GFR: 84.23 mL/min (ref >60.00)
Glucose, Bld: 92 mg/dL (ref 70–99)
Potassium: 4.1 meq/L (ref 3.5–5.1)
Sodium: 141 meq/L (ref 135–145)

## 2023-08-01 LAB — HEPATIC FUNCTION PANEL
ALT: 43 U/L — ABNORMAL HIGH (ref 0–35)
AST: 31 U/L (ref 0–37)
Albumin: 4 g/dL (ref 3.5–5.2)
Alkaline Phosphatase: 48 U/L (ref 39–117)
Bilirubin, Direct: 0.1 mg/dL (ref 0.0–0.3)
Total Bilirubin: 0.3 mg/dL (ref 0.2–1.2)
Total Protein: 6.4 g/dL (ref 6.0–8.3)

## 2023-08-01 LAB — HEMOGLOBIN A1C: Hgb A1c MFr Bld: 5.3 % (ref 4.6–6.5)

## 2023-08-01 LAB — MICROALBUMIN / CREATININE URINE RATIO
Creatinine,U: 168.6 mg/dL
Microalb Creat Ratio: 11.3 mg/g (ref 0.0–30.0)
Microalb, Ur: 1.9 mg/dL (ref 0.0–1.9)

## 2023-08-01 LAB — LIPID PANEL
Cholesterol: 144 mg/dL (ref 0–200)
HDL: 63 mg/dL (ref >39.00)
LDL Cholesterol: 64 mg/dL (ref 0–99)
NonHDL: 81.07
Total CHOL/HDL Ratio: 2
Triglycerides: 84 mg/dL (ref 0.0–149.0)
VLDL: 16.8 mg/dL (ref 0.0–40.0)

## 2023-08-01 LAB — CBC WITH DIFFERENTIAL/PLATELET
Basophils Absolute: 0 K/uL (ref 0.0–0.1)
Basophils Relative: 0.8 % (ref 0.0–3.0)
Eosinophils Absolute: 0 K/uL (ref 0.0–0.7)
Eosinophils Relative: 0.8 % (ref 0.0–5.0)
HCT: 41.1 % (ref 36.0–46.0)
Hemoglobin: 13.5 g/dL (ref 12.0–15.0)
Lymphocytes Relative: 12.6 % (ref 12.0–46.0)
Lymphs Abs: 0.7 K/uL (ref 0.7–4.0)
MCHC: 32.8 g/dL (ref 30.0–36.0)
MCV: 95.1 fl (ref 78.0–100.0)
Monocytes Absolute: 0.4 K/uL (ref 0.1–1.0)
Monocytes Relative: 7.5 % (ref 3.0–12.0)
Neutro Abs: 4.5 K/uL (ref 1.4–7.7)
Neutrophils Relative %: 78.3 % — ABNORMAL HIGH (ref 43.0–77.0)
Platelets: 230 K/uL (ref 150.0–400.0)
RBC: 4.32 Mil/uL (ref 3.87–5.11)
RDW: 13 % (ref 11.5–15.5)
WBC: 5.7 K/uL (ref 4.0–10.5)

## 2023-08-01 MED ORDER — OZEMPIC (1 MG/DOSE) 4 MG/3ML ~~LOC~~ SOPN
1.0000 mg | PEN_INJECTOR | SUBCUTANEOUS | 1 refills | Status: DC
Start: 2023-08-01 — End: 2023-09-12

## 2023-08-01 NOTE — Patient Instructions (Signed)

## 2023-08-01 NOTE — Progress Notes (Unsigned)
 Subjective:  Patient ID: Tonya [name], female    DOB: 1950-05-20  Age: 73 y.o. MRN: [id number]  CC: Referral (Patient would like a cardiologist referral to Dr. Dorise Shallow or  Dr. Annabella Scarce. States Dr. Ladona office is hard to get in touch with.), Annual Exam, Hypertension, Hyperlipidemia, and Diabetes   HPI Tonya [name] presents for a CPX and f/up ----  Discussed the use of AI scribe software for clinical note transcription with the patient, who gave verbal consent to proceed.  History of Present Illness Tonya [name] is a 73 year old female who presents for blood pressure management and cardiology referral.  She reports low blood pressure without symptoms such as weakness, dizziness, or lightheadedness. Approximately six months ago, a medication was discontinued due to dizziness, which resolved after stopping the medication.  She has experienced weight loss, which she attributes to the use of Ozempic . She experiences occasional queasiness and notes that sugar and coffee upset her stomach, leading her to stop consuming them. No nausea, vomiting, constipation, or cramping, but she reports diarrhea, which she attributes to her past colon cancer surgery.  She has a history of colon cancer with surgery and reports that she has not had a recurrence. She experiences diarrhea, which she associates with her previous colon surgery.  She is currently unable to contact her cardiologist and is seeking a new cardiologist. Her last EKG was approximately two years ago.  She had an eye exam within the last year but cannot recall the doctor's name.    Outpatient Medications Prior to Visit  Medication Sig Dispense Refill   acetaminophen  (TYLENOL ) 500 MG tablet Take 1,000 mg by mouth every 8 (eight) hours as needed for moderate pain.     aspirin  81 MG chewable tablet Chew 1 tablet (81 mg total) by mouth daily. 90 tablet 1   blood glucose meter kit and supplies KIT Dispense  based on patient and insurance preference. Use up to four times daily as directed. 1 each 0   cholecalciferol  (VITAMIN D ) 25 MCG (1000 UNIT) tablet Take 1,000 Units by mouth daily.     ferrous sulfate  325 (65 FE) MG tablet Take 324 mg by mouth daily with breakfast.     Insulin  Pen Needle 32G X 6 MM MISC 1 Act by Does not apply route once a week. 50 each 0   Lancets (ONETOUCH DELICA PLUS LANCET33G) MISC      Multiple Vitamin (MULTIVITAMIN) tablet Take 1 tablet by mouth daily.     rosuvastatin  (CRESTOR ) 20 MG tablet Take 1 tablet (20 mg total) by mouth daily. 30 tablet 1   Semaglutide , 1 MG/DOSE, (OZEMPIC , 1 MG/DOSE,) 4 MG/3ML SOPN Inject 1 mg into the skin once a week. 3 mL 3   No facility-administered medications prior to visit.    ROS Review of Systems  Constitutional:  Positive for unexpected weight change (wt loss). Negative for appetite change, chills, diaphoresis and fatigue.  HENT: Negative.  Negative for sore throat and trouble swallowing.   Eyes: Negative.   Respiratory: Negative.  Negative for cough, chest tightness, shortness of breath and wheezing.   Cardiovascular:  Negative for chest pain, palpitations and leg swelling.  Gastrointestinal: Negative.  Negative for abdominal pain, constipation, diarrhea, nausea and vomiting.  Endocrine: Negative.   Genitourinary: Negative.  Negative for difficulty urinating.  Musculoskeletal: Negative.  Negative for arthralgias and myalgias.  Skin: Negative.   Neurological:  Negative for dizziness, weakness and light-headedness.  Hematological:  Negative  for adenopathy. Does not bruise/bleed easily.  Psychiatric/Behavioral: Negative.      Objective:  BP 132/74 (BP Location: Left Arm, Patient Position: Sitting, Cuff Size: Normal)   Pulse 81   Temp 97.7 F (36.5 C) (Oral)   Resp 16   Ht 5' (1.524 m)   Wt 125 lb (56.7 kg)   SpO2 96%   BMI 24.41 kg/m   BP Readings from Last 3 Encounters:  08/01/23 132/74  01/21/23 110/78  12/22/22  90/60    Wt Readings from Last 3 Encounters:  08/01/23 125 lb (56.7 kg)  02/23/23 133 lb (60.3 kg)  01/21/23 133 lb (60.3 kg)    Physical Exam Vitals reviewed.  Constitutional:      Appearance: She is not ill-appearing.  HENT:     Mouth/Throat:     Mouth: Mucous membranes are moist.  Eyes:     General: No scleral icterus.    Conjunctiva/sclera: Conjunctivae normal.  Cardiovascular:     Rate and Rhythm: Normal rate and regular rhythm.     Heart sounds: No murmur heard.    No friction rub. No gallop.     Comments: EKG--- NSR, 72 bpm No LVH, Q waves, or ST/T wave changes  Improved  Pulmonary:     Effort: Pulmonary effort is normal.     Breath sounds: No stridor. No wheezing, rhonchi or rales.  Abdominal:     General: Abdomen is flat.     Palpations: There is no mass.     Tenderness: There is no abdominal tenderness. There is no guarding.     Hernia: No hernia is present.  Musculoskeletal:        General: Normal range of motion.     Cervical back: Neck supple.     Right lower leg: No edema.     Left lower leg: No edema.  Lymphadenopathy:     Cervical: No cervical adenopathy.  Skin:    General: Skin is warm and dry.     Findings: No lesion.  Neurological:     Mental Status: She is alert.  Psychiatric:        Mood and Affect: Mood normal.     Lab Results  Component Value Date   WBC 5.8 12/22/2022   HGB 12.2 12/22/2022   HCT 36.7 12/22/2022   PLT 221.0 12/22/2022   GLUCOSE 85 12/22/2022   CHOL 135 03/02/2022   TRIG 70.0 03/02/2022   HDL 62.20 03/02/2022   LDLCALC 59 03/02/2022   ALT 26 02/20/2021   AST 24 02/20/2021   NA 141 12/22/2022   K 4.0 12/22/2022   CL 107 12/22/2022   CREATININE 0.77 12/22/2022   BUN 22 12/22/2022   CO2 26 12/22/2022   TSH 1.42 10/20/2021   INR 1.0 08/02/2019   HGBA1C 5.5 09/09/2022    DG Bone Density Result Date: 04/08/2023 Table formatting from the original result was not included. Date of study: 04/07/2023 Exam: DUAL  X-RAY ABSORPTIOMETRY (DXA) FOR BONE MINERAL DENSITY (BMD) Instrument: [employer] Requesting Provider: PCP Indication: screening for osteoporosis Comparison:  none (please note that it is not possible to compare data from different instruments) Clinical data: Pt is a 73 y.o. female without history of fracture. Results:  Lumbar spine L1-L4 Femoral neck (FN) T-score   -2.8 RFN: -2.6 LFN: -2.5 Assessment: Patient has OSTEOPOROSIS according to the [facility] classification for osteoporosis (see below). Fracture risk: high Comments: the technical quality of the study is good. In patients older than 75, the  high prevalence of degenerative changes in the lumbar spine results in artificially higher bone density readings. WHO criteria for diagnosis of osteoporosis in postmenopausal women and in men 10 y/o or older: - normal: T-score -1.0 to + 1.0 - osteopenia/low bone density: T-score between -2.5 and -1.0 - osteoporosis: T-score below -2.5 - severe osteoporosis: T-score below -2.5 with history of fragility fracture Note: although not part of the WHO classification, the presence of a fragility fracture, regardless of the T-score, should be considered diagnostic of osteoporosis, provided other causes for the fracture have been excluded. Treatment: The National Osteoporosis Foundation recommends that treatment be considered in postmenopausal women and men age 8 or older with: 1. Hip or vertebral (clinical or morphometric) fracture 2. T-score of - 2.5 or lower at the spine or hip 3. 10-year fracture probability by FRAX of at least 20% for a major osteoporotic fracture and 3% for a hip fracture RECOMMENDATION: 1. All patients should optimize calcium  and vitamin D  intake. 2. Consider FDA-approved medical therapies in postmenopausal women and men aged 73 years and older, based on the following: a. A hip or vertebral(clinical or morphometric) fracture. b. T-Score of  -2.5 or less at the femoral neck , total hip or spine after  appropriate evaluation to exclude secondary causes c. Low bone mass (T-score between -1.0 and -2.5 at the femoral neck or spine) and a 10 year probability of a hip fracture >3% or a 10 year probability of major osteoporosis-related fracture > 20% based on the [place] -adapted WHO algorithm d. Clinical judgement and/or patient preferences may indicate treatment for people with 10-year fracture probabilities above or below these levels Followup: Repeat BMD in 2 -3 years or as clinically indicated Interpreted by : Stefano Redgie Butts, MD [facility]    Assessment & Plan:  Encounter for general adult medical examination with abnormal findings  Atherosclerosis of native coronary artery of native heart with stable angina pectoris (HCC) -     Lipid panel; Future  Hyperlipidemia LDL goal <70 -     TSH; Future -     Hepatic function panel; Future -     Lipid panel; Future  Type II diabetes mellitus with manifestations (HCC) -     Ozempic  (1 MG/DOSE); Inject 1 mg into the skin once a week.  Dispense: 9 mL; Refill: 1 -     HM Diabetes Foot Exam -     Basic metabolic panel with GFR; Future -     Urinalysis, Routine w reflex microscopic; Future -     Hemoglobin A1c; Future -     Microalbumin / creatinine urine ratio; Future  Coronary artery disease involving native coronary artery of native heart without angina pectoris -     Ozempic  (1 MG/DOSE); Inject 1 mg into the skin once a week.  Dispense: 9 mL; Refill: 1 -     Ambulatory referral to Cardiology -     EKG 12-Lead  Chronic heart failure with preserved ejection fraction (HCC) -     Ozempic  (1 MG/DOSE); Inject 1 mg into the skin once a week.  Dispense: 9 mL; Refill: 1 -     Ambulatory referral to Cardiology -     EKG 12-Lead  LVH (left ventricular hypertrophy) due to hypertensive disease, with heart failure (HCC) -     EKG 12-Lead  Primary hypertension -     Basic metabolic panel with GFR; Future -     CBC with  Differential/Platelet; Future -  TSH; Future     Follow-up: Return in about 6 months (around 02/01/2024).  Debby Molt, MD

## 2023-08-02 ENCOUNTER — Ambulatory Visit: Payer: Self-pay | Admitting: Internal Medicine

## 2023-08-02 DIAGNOSIS — L570 Actinic keratosis: Secondary | ICD-10-CM | POA: Diagnosis not present

## 2023-08-02 LAB — URINALYSIS, ROUTINE W REFLEX MICROSCOPIC
Bilirubin Urine: NEGATIVE
Hgb urine dipstick: NEGATIVE
Leukocytes,Ua: NEGATIVE
Nitrite: NEGATIVE
RBC / HPF: NONE SEEN (ref 0–?)
Specific Gravity, Urine: >=1.03 — AB (ref 1.000–1.030)
Total Protein, Urine: NEGATIVE
Urine Glucose: NEGATIVE
Urobilinogen, UA: 0.2 (ref 0.0–1.0)
pH: 6 (ref 5.0–8.0)

## 2023-08-02 LAB — TSH: TSH: 1.89 u[IU]/mL (ref 0.35–5.50)

## 2023-08-08 ENCOUNTER — Encounter: Payer: Self-pay | Admitting: Internal Medicine

## 2023-09-12 ENCOUNTER — Other Ambulatory Visit: Payer: Self-pay | Admitting: Family Medicine

## 2023-09-12 DIAGNOSIS — E118 Type 2 diabetes mellitus with unspecified complications: Secondary | ICD-10-CM

## 2023-09-12 DIAGNOSIS — I2510 Atherosclerotic heart disease of native coronary artery without angina pectoris: Secondary | ICD-10-CM

## 2023-09-12 DIAGNOSIS — I5032 Chronic diastolic (congestive) heart failure: Secondary | ICD-10-CM

## 2023-10-07 ENCOUNTER — Encounter: Payer: Self-pay | Admitting: Cardiology

## 2023-10-07 ENCOUNTER — Ambulatory Visit: Attending: Cardiology | Admitting: Cardiology

## 2023-10-07 VITALS — BP 100/70 | HR 71 | Ht 60.0 in | Wt 125.4 lb

## 2023-10-07 DIAGNOSIS — I2510 Atherosclerotic heart disease of native coronary artery without angina pectoris: Secondary | ICD-10-CM | POA: Diagnosis not present

## 2023-10-07 DIAGNOSIS — E785 Hyperlipidemia, unspecified: Secondary | ICD-10-CM

## 2023-10-07 DIAGNOSIS — E119 Type 2 diabetes mellitus without complications: Secondary | ICD-10-CM | POA: Diagnosis not present

## 2023-10-07 NOTE — Progress Notes (Signed)
 Cardiology Office Note:  .   Date:  10/07/2023  ID:  Tonya [name], DOB 09/10/1950, MRN [id number] PCP: Joshua Debby CROME, MD  [facility] Providers Cardiologist:  None   History of Present Illness: .   Tonya [name] is a 73 y.o. Caucasian female with Coronary disease and complex angioplasty to proximal to mid to distal segment of the LAD on 08/26/2020, hypertension, chronic diastolic heart failure heart failure, hyperlipidemia, obesity, rectal adenocarcinoma, s/p resection with colostomy, and gastric bypass presents for annual follow-up of CAD. Remains asymptomatic.  Cardiac Studies relevent.    Left heart catheterization 08/26/2020:  Prox and Mid LAD 3.0 x 18 mm, 2.5 x 30 mm and distal 2.0 x 12 mm Onyx frontier DES       Discussed the use of AI scribe software for clinical note transcription with the patient, who gave verbal consent to proceed.  History of Present Illness Tonya [name] is a 73 year old female with coronary artery disease who presents for a follow-up visit.  She remains asymptomatic with no chest pain or heart failure symptoms since her last visit. She had a stent placed in 2022 and has not experienced angina since. Her cholesterol is well controlled with an LDL of 64 on Crestor  20 mg daily, and she is on aspirin  for coronary artery disease.  Her diabetes is well managed with an A1c of 5.3%. She has lost weight and maintained it for about a year. She is no longer on antihypertensive medications and experiences no leg cramping when walking.   Labs   Lab Results  Component Value Date   CHOL 144 08/01/2023   HDL 63.00 08/01/2023   LDLCALC 64 08/01/2023   TRIG 84.0 08/01/2023   CHOLHDL 2 08/01/2023   No results found for: LIPOA  Recent Labs    12/22/22 1411 08/01/23 1606  NA 141 141  K 4.0 4.1  CL 107 105  CO2 26 30  GLUCOSE 85 92  BUN 22 18  CREATININE 0.77 0.72  CALCIUM  9.1 9.5    Lab Results  Component Value Date   ALT  43 (H) 08/01/2023   AST 31 08/01/2023   ALKPHOS 48 08/01/2023   BILITOT 0.3 08/01/2023      Latest Ref Rng & Units 08/01/2023    4:06 PM 12/22/2022    2:11 PM 09/09/2022    3:29 PM  CBC  WBC 4.0 - 10.5 K/uL 5.7  5.8  8.3   Hemoglobin 12.0 - 15.0 g/dL 86.4  87.7  87.6   Hematocrit 36.0 - 46.0 % 41.1  36.7  38.0   Platelets 150.0 - 400.0 K/uL 230.0  221.0  219.0    Lab Results  Component Value Date   HGBA1C 5.3 08/01/2023    Lab Results  Component Value Date   TSH 1.89 08/01/2023     ROS  Review of Systems  Cardiovascular:  Negative for chest pain, dyspnea on exertion and leg swelling.   Physical Exam:   VS:  There were no vitals taken for this visit.   Wt Readings from Last 3 Encounters:  08/01/23 125 lb (56.7 kg)  02/23/23 133 lb (60.3 kg)  01/21/23 133 lb (60.3 kg)    BP Readings from Last 3 Encounters:  08/01/23 132/74  01/21/23 110/78  12/22/22 90/60   Physical Exam Neck:     Vascular: No carotid bruit or JVD.  Cardiovascular:     Rate and Rhythm: Normal rate and regular rhythm.  Pulses:          Dorsalis pedis pulses are 1+ on the right side and 1+ on the left side.       Posterior tibial pulses are 0 on the right side and 0 on the left side.     Heart sounds: Normal heart sounds. No murmur heard.    No gallop.  Pulmonary:     Effort: Pulmonary effort is normal.     Breath sounds: Normal breath sounds.  Abdominal:     General: Bowel sounds are normal.     Palpations: Abdomen is soft.  Musculoskeletal:     Right lower leg: No edema.     Left lower leg: No edema.    EKG:         ASSESSMENT AND PLAN: .    No diagnosis found.  Assessment and Plan Assessment & Plan Atherosclerotic heart disease of native coronary artery, post-stent Atherosclerotic heart disease with three stents placed in the LAD in 2022. No angina or heart failure symptoms since the procedure. Condition is well-managed with aspirin  and Crestor . - Continue aspirin  therapy -  Continue Crestor  20 mg once daily  Hyperlipidemia Hyperlipidemia is well-controlled with an LDL level of 64 mg/dL, below the target of 70 mg/dL. Crestor  20 mg once daily effectively manages lipid levels. - Continue Crestor  20 mg once daily  Type 2 diabetes mellitus, well controlled Type 2 diabetes mellitus is well-controlled with a hemoglobin A1c of 5.3%, below the diabetic range. She is on Ozempic  and tolerating it well. There is a concern about the cost of Ozempic , but she is currently able to afford it. Insurance coverage and cost management strategies were discussed. - Continue Ozempic  therapy   Follow up: PRN  Signed,  Gordy Bergamo, MD, [facility] 10/07/2023, 6:03 AM [facility] [address] [place], [place] [address] Phone: [phone]. Fax:  [phone]

## 2023-10-07 NOTE — Patient Instructions (Signed)
 Medication Instructions:  Your physician recommends that you continue on your current medications as directed. Please refer to the Current Medication list given to you today.  *If you need a refill on your cardiac medications before your next appointment, please call your pharmacy*  Lab Work: None ordered.  If you have labs (blood work) drawn today and your tests are completely normal, you will receive your results only by: MyChart Message (if you have MyChart) OR A paper copy in the mail If you have any lab test that is abnormal or we need to change your treatment, we will call you to review the results.  Testing/Procedures: None ordered.   Follow-Up: At [facility], you and your health needs are our priority.  As part of our continuing mission to provide you with exceptional heart care, our providers are all part of one team.  This team includes your primary Cardiologist (physician) and Advanced Practice Providers or APPs (Physician Assistants and Nurse Practitioners) who all work together to provide you with the care you need, when you need it.  Your next appointment:   Follow up with Dr Ladona as needed

## 2023-10-17 DIAGNOSIS — H43813 Vitreous degeneration, bilateral: Secondary | ICD-10-CM | POA: Diagnosis not present

## 2023-10-18 DIAGNOSIS — Z0100 Encounter for examination of eyes and vision without abnormal findings: Secondary | ICD-10-CM | POA: Diagnosis not present

## 2023-10-20 ENCOUNTER — Other Ambulatory Visit: Payer: Self-pay | Admitting: Internal Medicine

## 2023-10-20 DIAGNOSIS — I25118 Atherosclerotic heart disease of native coronary artery with other forms of angina pectoris: Secondary | ICD-10-CM

## 2023-10-20 DIAGNOSIS — E785 Hyperlipidemia, unspecified: Secondary | ICD-10-CM

## 2023-10-24 ENCOUNTER — Ambulatory Visit: Payer: Self-pay | Admitting: Internal Medicine

## 2023-12-14 DIAGNOSIS — D485 Neoplasm of uncertain behavior of skin: Secondary | ICD-10-CM | POA: Diagnosis not present

## 2023-12-27 ENCOUNTER — Encounter: Payer: Self-pay | Admitting: Internal Medicine

## 2023-12-30 ENCOUNTER — Other Ambulatory Visit: Payer: Self-pay | Admitting: Internal Medicine

## 2023-12-30 ENCOUNTER — Encounter: Payer: Self-pay | Admitting: Internal Medicine

## 2023-12-30 NOTE — Telephone Encounter (Signed)
 Letter written

## 2024-02-01 ENCOUNTER — Encounter: Payer: Self-pay | Admitting: Internal Medicine

## 2024-02-07 ENCOUNTER — Other Ambulatory Visit: Payer: Self-pay | Admitting: Internal Medicine

## 2024-02-07 DIAGNOSIS — I25118 Atherosclerotic heart disease of native coronary artery with other forms of angina pectoris: Secondary | ICD-10-CM

## 2024-02-07 DIAGNOSIS — E785 Hyperlipidemia, unspecified: Secondary | ICD-10-CM

## 2024-10-05 ENCOUNTER — Ambulatory Visit: Payer: Medicare Other
